# Patient Record
Sex: Female | Born: 1970
Health system: Southern US, Community
[De-identification: ages and names within clinical notes are randomized; demographics above are authoritative.]

## PROBLEM LIST (undated history)

## (undated) ENCOUNTER — Inpatient Hospital Stay: Admission: EM | Payer: Self-pay | Source: Home / Self Care

## (undated) DIAGNOSIS — Z923 Personal history of irradiation: Secondary | ICD-10-CM

## (undated) DIAGNOSIS — K7460 Unspecified cirrhosis of liver: Secondary | ICD-10-CM

## (undated) DIAGNOSIS — E079 Disorder of thyroid, unspecified: Secondary | ICD-10-CM

## (undated) DIAGNOSIS — J679 Hypersensitivity pneumonitis due to unspecified organic dust: Secondary | ICD-10-CM

## (undated) DIAGNOSIS — J302 Other seasonal allergic rhinitis: Secondary | ICD-10-CM

## (undated) DIAGNOSIS — E039 Hypothyroidism, unspecified: Secondary | ICD-10-CM

## (undated) DIAGNOSIS — B029 Zoster without complications: Secondary | ICD-10-CM

## (undated) DIAGNOSIS — D696 Thrombocytopenia, unspecified: Secondary | ICD-10-CM

## (undated) DIAGNOSIS — K219 Gastro-esophageal reflux disease without esophagitis: Secondary | ICD-10-CM

## (undated) DIAGNOSIS — N910 Primary amenorrhea: Secondary | ICD-10-CM

## (undated) DIAGNOSIS — D649 Anemia, unspecified: Secondary | ICD-10-CM

## (undated) DIAGNOSIS — Z9221 Personal history of antineoplastic chemotherapy: Secondary | ICD-10-CM

## (undated) DIAGNOSIS — E782 Mixed hyperlipidemia: Secondary | ICD-10-CM

## (undated) DIAGNOSIS — R002 Palpitations: Secondary | ICD-10-CM

## (undated) DIAGNOSIS — R569 Unspecified convulsions: Secondary | ICD-10-CM

## (undated) DIAGNOSIS — R011 Cardiac murmur, unspecified: Secondary | ICD-10-CM

## (undated) DIAGNOSIS — B1920 Unspecified viral hepatitis C without hepatic coma: Secondary | ICD-10-CM

## (undated) DIAGNOSIS — C801 Malignant (primary) neoplasm, unspecified: Secondary | ICD-10-CM

## (undated) DIAGNOSIS — C9590 Leukemia, unspecified not having achieved remission: Secondary | ICD-10-CM

## (undated) DIAGNOSIS — Z9289 Personal history of other medical treatment: Secondary | ICD-10-CM

## (undated) DIAGNOSIS — R0609 Other forms of dyspnea: Secondary | ICD-10-CM

## (undated) DIAGNOSIS — Z9484 Stem cells transplant status: Secondary | ICD-10-CM

## (undated) DIAGNOSIS — I10 Essential (primary) hypertension: Secondary | ICD-10-CM

## (undated) DIAGNOSIS — I2510 Atherosclerotic heart disease of native coronary artery without angina pectoris: Secondary | ICD-10-CM

## (undated) DIAGNOSIS — J849 Interstitial pulmonary disease, unspecified: Secondary | ICD-10-CM

## (undated) DIAGNOSIS — J8410 Pulmonary fibrosis, unspecified: Secondary | ICD-10-CM

## (undated) HISTORY — PX: BONE MARROW TRANSPLANT: SHX200

## (undated) HISTORY — PX: OTHER SURGICAL HISTORY: SHX169

## (undated) HISTORY — DX: Personal history of other medical treatment: Z92.89

## (undated) HISTORY — PX: BUNIONECTOMY: SHX129

## (undated) HISTORY — DX: Disorder of thyroid, unspecified: E07.9

## (undated) HISTORY — DX: Pulmonary fibrosis, unspecified: J84.10

## (undated) HISTORY — DX: Unspecified cirrhosis of liver: K74.60

## (undated) HISTORY — DX: Unspecified convulsions: R56.9

## (undated) HISTORY — DX: Thrombocytopenia, unspecified: D69.6

## (undated) HISTORY — DX: Anemia, unspecified: D64.9

## (undated) HISTORY — DX: Leukemia, unspecified not having achieved remission: C95.90

## (undated) HISTORY — DX: Zoster without complications: B02.9

## (undated) HISTORY — PX: TONGUE BIOPSY: SHX1075

## (undated) HISTORY — PX: CHOLECYSTECTOMY: SHX55

## (undated) HISTORY — DX: Malignant (primary) neoplasm, unspecified: C80.1

## (undated) HISTORY — DX: Unspecified viral hepatitis C without hepatic coma: B19.20

---
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
## 1992-05-06 HISTORY — PX: CHOLECYSTECTOMY, LAPAROSCOPIC: SHX56

## 2000-05-14 ENCOUNTER — Encounter: Admission: RE | Admit: 2000-05-14 | Discharge: 2000-05-14 | Payer: Self-pay | Admitting: *Deleted

## 2000-05-14 ENCOUNTER — Encounter: Payer: Self-pay | Admitting: *Deleted

## 2000-07-25 ENCOUNTER — Encounter (INDEPENDENT_AMBULATORY_CARE_PROVIDER_SITE_OTHER): Payer: Self-pay

## 2000-07-25 ENCOUNTER — Ambulatory Visit (HOSPITAL_COMMUNITY): Admission: RE | Admit: 2000-07-25 | Discharge: 2000-07-25 | Payer: Self-pay | Admitting: *Deleted

## 2000-09-15 ENCOUNTER — Other Ambulatory Visit: Admission: RE | Admit: 2000-09-15 | Discharge: 2000-09-15 | Payer: Self-pay | Admitting: *Deleted

## 2001-11-01 ENCOUNTER — Encounter: Payer: Self-pay | Admitting: Emergency Medicine

## 2001-11-01 ENCOUNTER — Emergency Department (HOSPITAL_COMMUNITY): Admission: EM | Admit: 2001-11-01 | Discharge: 2001-11-01 | Payer: Self-pay | Admitting: Emergency Medicine

## 2003-10-22 ENCOUNTER — Ambulatory Visit (HOSPITAL_COMMUNITY): Admission: RE | Admit: 2003-10-22 | Discharge: 2003-10-22 | Payer: Self-pay | Admitting: Emergency Medicine

## 2003-10-22 ENCOUNTER — Emergency Department (HOSPITAL_COMMUNITY): Admission: EM | Admit: 2003-10-22 | Discharge: 2003-10-23 | Payer: Self-pay | Admitting: Emergency Medicine

## 2003-11-01 ENCOUNTER — Ambulatory Visit (HOSPITAL_COMMUNITY): Admission: RE | Admit: 2003-11-01 | Discharge: 2003-11-01 | Payer: Self-pay

## 2004-07-18 ENCOUNTER — Emergency Department (HOSPITAL_COMMUNITY): Admission: EM | Admit: 2004-07-18 | Discharge: 2004-07-18 | Payer: Self-pay | Admitting: Emergency Medicine

## 2005-04-12 ENCOUNTER — Ambulatory Visit (HOSPITAL_COMMUNITY): Admission: RE | Admit: 2005-04-12 | Discharge: 2005-04-12 | Payer: Self-pay | Admitting: Obstetrics and Gynecology

## 2005-04-12 ENCOUNTER — Ambulatory Visit: Payer: Self-pay | Admitting: Gastroenterology

## 2005-06-24 ENCOUNTER — Ambulatory Visit (HOSPITAL_COMMUNITY): Admission: RE | Admit: 2005-06-24 | Discharge: 2005-06-24 | Payer: Self-pay | Admitting: Obstetrics and Gynecology

## 2005-07-26 ENCOUNTER — Ambulatory Visit: Payer: Self-pay | Admitting: Gastroenterology

## 2005-08-13 ENCOUNTER — Ambulatory Visit: Payer: Self-pay | Admitting: Gastroenterology

## 2005-09-13 ENCOUNTER — Ambulatory Visit: Payer: Self-pay | Admitting: Gastroenterology

## 2005-10-17 ENCOUNTER — Ambulatory Visit: Payer: Self-pay | Admitting: Gastroenterology

## 2005-10-24 ENCOUNTER — Ambulatory Visit: Payer: Self-pay | Admitting: Gastroenterology

## 2005-11-14 ENCOUNTER — Ambulatory Visit: Payer: Self-pay | Admitting: Gastroenterology

## 2005-11-28 ENCOUNTER — Ambulatory Visit: Payer: Self-pay | Admitting: Gastroenterology

## 2005-12-12 ENCOUNTER — Ambulatory Visit: Payer: Self-pay | Admitting: Gastroenterology

## 2006-01-09 ENCOUNTER — Ambulatory Visit: Payer: Self-pay | Admitting: Gastroenterology

## 2006-02-06 ENCOUNTER — Ambulatory Visit: Payer: Self-pay | Admitting: Gastroenterology

## 2006-03-06 ENCOUNTER — Ambulatory Visit: Payer: Self-pay | Admitting: Gastroenterology

## 2006-04-03 ENCOUNTER — Ambulatory Visit: Payer: Self-pay | Admitting: Gastroenterology

## 2006-05-07 ENCOUNTER — Ambulatory Visit: Payer: Self-pay | Admitting: Gastroenterology

## 2006-05-25 ENCOUNTER — Emergency Department (HOSPITAL_COMMUNITY): Admission: EM | Admit: 2006-05-25 | Discharge: 2006-05-25 | Payer: Self-pay | Admitting: Emergency Medicine

## 2006-06-05 ENCOUNTER — Ambulatory Visit: Payer: Self-pay | Admitting: Gastroenterology

## 2006-06-05 ENCOUNTER — Encounter (HOSPITAL_COMMUNITY): Admission: RE | Admit: 2006-06-05 | Discharge: 2006-07-05 | Payer: Self-pay | Admitting: Preventative Medicine

## 2006-07-24 ENCOUNTER — Ambulatory Visit: Payer: Self-pay | Admitting: Gastroenterology

## 2006-09-18 ENCOUNTER — Ambulatory Visit: Payer: Self-pay | Admitting: Gastroenterology

## 2006-12-18 ENCOUNTER — Ambulatory Visit: Payer: Self-pay | Admitting: Gastroenterology

## 2007-03-30 ENCOUNTER — Ambulatory Visit (HOSPITAL_COMMUNITY): Admission: RE | Admit: 2007-03-30 | Discharge: 2007-03-30 | Payer: Self-pay | Admitting: Obstetrics and Gynecology

## 2007-04-15 ENCOUNTER — Ambulatory Visit (HOSPITAL_COMMUNITY): Admission: RE | Admit: 2007-04-15 | Discharge: 2007-04-15 | Payer: Self-pay | Admitting: Obstetrics and Gynecology

## 2007-06-30 ENCOUNTER — Ambulatory Visit: Payer: Self-pay | Admitting: Gastroenterology

## 2007-08-13 ENCOUNTER — Ambulatory Visit (HOSPITAL_COMMUNITY): Admission: RE | Admit: 2007-08-13 | Discharge: 2007-08-13 | Payer: Self-pay | Admitting: Family Medicine

## 2007-09-03 ENCOUNTER — Ambulatory Visit (HOSPITAL_COMMUNITY): Admission: RE | Admit: 2007-09-03 | Discharge: 2007-09-03 | Payer: Self-pay | Admitting: Gastroenterology

## 2007-09-22 ENCOUNTER — Other Ambulatory Visit: Admission: RE | Admit: 2007-09-22 | Discharge: 2007-09-22 | Payer: Self-pay | Admitting: Obstetrics and Gynecology

## 2008-04-19 ENCOUNTER — Ambulatory Visit (HOSPITAL_COMMUNITY): Admission: RE | Admit: 2008-04-19 | Discharge: 2008-04-19 | Payer: Self-pay | Admitting: Obstetrics and Gynecology

## 2008-05-19 ENCOUNTER — Ambulatory Visit (HOSPITAL_COMMUNITY): Admission: RE | Admit: 2008-05-19 | Discharge: 2008-05-19 | Payer: Self-pay | Admitting: Obstetrics and Gynecology

## 2008-09-23 ENCOUNTER — Other Ambulatory Visit: Admission: RE | Admit: 2008-09-23 | Discharge: 2008-09-23 | Payer: Self-pay | Admitting: Obstetrics and Gynecology

## 2009-04-26 ENCOUNTER — Ambulatory Visit (HOSPITAL_COMMUNITY): Admission: RE | Admit: 2009-04-26 | Discharge: 2009-04-26 | Payer: Self-pay | Admitting: Obstetrics and Gynecology

## 2009-05-06 DIAGNOSIS — Z87442 Personal history of urinary calculi: Secondary | ICD-10-CM

## 2009-05-06 HISTORY — DX: Personal history of urinary calculi: Z87.442

## 2009-05-19 ENCOUNTER — Ambulatory Visit (HOSPITAL_COMMUNITY): Admission: RE | Admit: 2009-05-19 | Discharge: 2009-05-19 | Payer: Self-pay | Admitting: Family Medicine

## 2009-06-09 ENCOUNTER — Ambulatory Visit (HOSPITAL_COMMUNITY): Payer: Self-pay | Admitting: Oncology

## 2009-06-09 ENCOUNTER — Encounter (HOSPITAL_COMMUNITY): Admission: RE | Admit: 2009-06-09 | Discharge: 2009-07-09 | Payer: Self-pay | Admitting: Oncology

## 2009-07-14 ENCOUNTER — Encounter (HOSPITAL_COMMUNITY): Admission: RE | Admit: 2009-07-14 | Discharge: 2009-08-13 | Payer: Self-pay | Admitting: Oncology

## 2009-07-31 ENCOUNTER — Ambulatory Visit (HOSPITAL_COMMUNITY): Payer: Self-pay | Admitting: Oncology

## 2009-08-28 ENCOUNTER — Encounter (HOSPITAL_COMMUNITY): Admission: RE | Admit: 2009-08-28 | Discharge: 2009-09-27 | Payer: Self-pay | Admitting: Oncology

## 2009-09-25 ENCOUNTER — Ambulatory Visit (HOSPITAL_COMMUNITY): Payer: Self-pay | Admitting: Oncology

## 2009-09-27 ENCOUNTER — Other Ambulatory Visit: Admission: RE | Admit: 2009-09-27 | Discharge: 2009-09-27 | Payer: Self-pay | Admitting: Obstetrics and Gynecology

## 2009-10-23 ENCOUNTER — Encounter (HOSPITAL_COMMUNITY): Admission: RE | Admit: 2009-10-23 | Discharge: 2009-11-22 | Payer: Self-pay | Admitting: Oncology

## 2009-11-20 ENCOUNTER — Ambulatory Visit (HOSPITAL_COMMUNITY): Payer: Self-pay | Admitting: Oncology

## 2009-12-18 ENCOUNTER — Inpatient Hospital Stay (HOSPITAL_COMMUNITY): Admission: EM | Admit: 2009-12-18 | Discharge: 2009-12-20 | Payer: Self-pay | Admitting: Emergency Medicine

## 2009-12-25 ENCOUNTER — Encounter (HOSPITAL_COMMUNITY): Admission: RE | Admit: 2009-12-25 | Discharge: 2010-01-24 | Payer: Self-pay | Admitting: Oncology

## 2010-01-09 ENCOUNTER — Ambulatory Visit (HOSPITAL_COMMUNITY): Payer: Self-pay | Admitting: Oncology

## 2010-02-19 ENCOUNTER — Encounter (HOSPITAL_COMMUNITY)
Admission: RE | Admit: 2010-02-19 | Discharge: 2010-03-21 | Payer: Self-pay | Source: Home / Self Care | Admitting: Oncology

## 2010-05-01 ENCOUNTER — Ambulatory Visit (HOSPITAL_COMMUNITY)
Admission: RE | Admit: 2010-05-01 | Discharge: 2010-05-01 | Payer: Self-pay | Source: Home / Self Care | Attending: Obstetrics and Gynecology | Admitting: Obstetrics and Gynecology

## 2010-05-06 DIAGNOSIS — Z8619 Personal history of other infectious and parasitic diseases: Secondary | ICD-10-CM

## 2010-05-06 HISTORY — DX: Personal history of other infectious and parasitic diseases: Z86.19

## 2010-05-27 ENCOUNTER — Encounter: Payer: Self-pay | Admitting: Obstetrics and Gynecology

## 2010-06-11 ENCOUNTER — Other Ambulatory Visit (HOSPITAL_COMMUNITY): Payer: Self-pay | Admitting: Oncology

## 2010-06-11 ENCOUNTER — Encounter (HOSPITAL_COMMUNITY): Payer: 59 | Attending: Oncology

## 2010-06-11 ENCOUNTER — Other Ambulatory Visit (HOSPITAL_COMMUNITY): Payer: 59

## 2010-06-11 ENCOUNTER — Encounter (HOSPITAL_COMMUNITY): Admission: RE | Admit: 2010-06-11 | Payer: Self-pay | Source: Home / Self Care | Admitting: Oncology

## 2010-06-11 DIAGNOSIS — C9101 Acute lymphoblastic leukemia, in remission: Secondary | ICD-10-CM | POA: Insufficient documentation

## 2010-06-11 DIAGNOSIS — D696 Thrombocytopenia, unspecified: Secondary | ICD-10-CM

## 2010-06-11 DIAGNOSIS — I10 Essential (primary) hypertension: Secondary | ICD-10-CM | POA: Insufficient documentation

## 2010-06-11 DIAGNOSIS — E039 Hypothyroidism, unspecified: Secondary | ICD-10-CM | POA: Insufficient documentation

## 2010-06-11 DIAGNOSIS — Z79899 Other long term (current) drug therapy: Secondary | ICD-10-CM | POA: Insufficient documentation

## 2010-06-11 DIAGNOSIS — B1920 Unspecified viral hepatitis C without hepatic coma: Secondary | ICD-10-CM | POA: Insufficient documentation

## 2010-06-11 LAB — CBC
HCT: 41.3 % (ref 36.0–46.0)
Hemoglobin: 14.2 g/dL (ref 12.0–15.0)
MCV: 98.3 fL (ref 78.0–100.0)
RDW: 12.9 % (ref 11.5–15.5)
WBC: 6.8 10*3/uL (ref 4.0–10.5)

## 2010-06-11 LAB — DIFFERENTIAL
Eosinophils Absolute: 0 10*3/uL (ref 0.0–0.7)
Eosinophils Relative: 0 % (ref 0–5)
Lymphocytes Relative: 36 % (ref 12–46)
Lymphs Abs: 2.4 10*3/uL (ref 0.7–4.0)
Neutro Abs: 3.7 10*3/uL (ref 1.7–7.7)
Neutrophils Relative %: 55 % (ref 43–77)

## 2010-06-18 ENCOUNTER — Ambulatory Visit (HOSPITAL_COMMUNITY): Payer: 59 | Admitting: Oncology

## 2010-06-18 DIAGNOSIS — D696 Thrombocytopenia, unspecified: Secondary | ICD-10-CM

## 2010-07-18 LAB — DIFFERENTIAL
Basophils Absolute: 0 10*3/uL (ref 0.0–0.1)
Basophils Relative: 0 % (ref 0–1)
Eosinophils Relative: 0 % (ref 0–5)
Lymphocytes Relative: 45 % (ref 12–46)
Monocytes Relative: 10 % (ref 3–12)

## 2010-07-18 LAB — CBC
HCT: 41.5 % (ref 36.0–46.0)
Hemoglobin: 14.1 g/dL (ref 12.0–15.0)
MCV: 104.2 fL — ABNORMAL HIGH (ref 78.0–100.0)
Platelets: 80 10*3/uL — ABNORMAL LOW (ref 150–400)
RBC: 3.99 MIL/uL (ref 3.87–5.11)
WBC: 5.8 10*3/uL (ref 4.0–10.5)

## 2010-07-19 LAB — DIFFERENTIAL
Basophils Absolute: 0 10*3/uL (ref 0.0–0.1)
Basophils Absolute: 0.1 10*3/uL (ref 0.0–0.1)
Basophils Relative: 0 % (ref 0–1)
Basophils Relative: 0 % (ref 0–1)
Basophils Relative: 1 % (ref 0–1)
Eosinophils Absolute: 0 10*3/uL (ref 0.0–0.7)
Eosinophils Absolute: 0.1 10*3/uL (ref 0.0–0.7)
Eosinophils Absolute: 0.1 10*3/uL (ref 0.0–0.7)
Eosinophils Relative: 0 % (ref 0–5)
Eosinophils Relative: 0 % (ref 0–5)
Eosinophils Relative: 1 % (ref 0–5)
Eosinophils Relative: 1 % (ref 0–5)
Lymphocytes Relative: 25 % (ref 12–46)
Lymphocytes Relative: 31 % (ref 12–46)
Lymphocytes Relative: 34 % (ref 12–46)
Lymphs Abs: 2.1 10*3/uL (ref 0.7–4.0)
Lymphs Abs: 2.4 10*3/uL (ref 0.7–4.0)
Lymphs Abs: 2.7 10*3/uL (ref 0.7–4.0)
Lymphs Abs: 4.7 10*3/uL — ABNORMAL HIGH (ref 0.7–4.0)
Monocytes Absolute: 0.4 10*3/uL (ref 0.1–1.0)
Monocytes Absolute: 0.6 10*3/uL (ref 0.1–1.0)
Monocytes Absolute: 0.7 10*3/uL (ref 0.1–1.0)
Monocytes Absolute: 0.9 10*3/uL (ref 0.1–1.0)
Monocytes Relative: 4 % (ref 3–12)
Monocytes Relative: 6 % (ref 3–12)
Monocytes Relative: 6 % (ref 3–12)
Monocytes Relative: 6 % (ref 3–12)
Neutro Abs: 4 10*3/uL (ref 1.7–7.7)
Neutro Abs: 7.4 10*3/uL (ref 1.7–7.7)
Neutro Abs: 9.5 10*3/uL — ABNORMAL HIGH (ref 1.7–7.7)
Neutrophils Relative %: 58 % (ref 43–77)
Neutrophils Relative %: 68 % (ref 43–77)

## 2010-07-19 LAB — CBC
HCT: 28.6 % — ABNORMAL LOW (ref 36.0–46.0)
HCT: 30.9 % — ABNORMAL LOW (ref 36.0–46.0)
HCT: 31.9 % — ABNORMAL LOW (ref 36.0–46.0)
HCT: 34.8 % — ABNORMAL LOW (ref 36.0–46.0)
Hemoglobin: 10.7 g/dL — ABNORMAL LOW (ref 12.0–15.0)
Hemoglobin: 12.1 g/dL (ref 12.0–15.0)
Hemoglobin: 9.9 g/dL — ABNORMAL LOW (ref 12.0–15.0)
MCH: 36.5 pg — ABNORMAL HIGH (ref 26.0–34.0)
MCH: 37 pg — ABNORMAL HIGH (ref 26.0–34.0)
MCH: 37.3 pg — ABNORMAL HIGH (ref 26.0–34.0)
MCHC: 34.5 g/dL (ref 30.0–36.0)
MCHC: 34.7 g/dL (ref 30.0–36.0)
MCHC: 34.7 g/dL (ref 30.0–36.0)
MCHC: 35 g/dL (ref 30.0–36.0)
MCV: 105.2 fL — ABNORMAL HIGH (ref 78.0–100.0)
MCV: 107.3 fL — ABNORMAL HIGH (ref 78.0–100.0)
MCV: 107.5 fL — ABNORMAL HIGH (ref 78.0–100.0)
Platelets: 138 10*3/uL — ABNORMAL LOW (ref 150–400)
Platelets: 59 10*3/uL — ABNORMAL LOW (ref 150–400)
Platelets: 63 10*3/uL — ABNORMAL LOW (ref 150–400)
Platelets: 72 10*3/uL — ABNORMAL LOW (ref 150–400)
RBC: 2.67 MIL/uL — ABNORMAL LOW (ref 3.87–5.11)
RBC: 2.88 MIL/uL — ABNORMAL LOW (ref 3.87–5.11)
RBC: 3.31 MIL/uL — ABNORMAL LOW (ref 3.87–5.11)
RDW: 11.7 % (ref 11.5–15.5)
RDW: 11.8 % (ref 11.5–15.5)
RDW: 12.3 % (ref 11.5–15.5)
WBC: 10.9 10*3/uL — ABNORMAL HIGH (ref 4.0–10.5)
WBC: 15.1 10*3/uL — ABNORMAL HIGH (ref 4.0–10.5)
WBC: 7 10*3/uL (ref 4.0–10.5)

## 2010-07-19 LAB — PHOSPHORUS: Phosphorus: 2.9 mg/dL (ref 2.3–4.6)

## 2010-07-19 LAB — BASIC METABOLIC PANEL
BUN: 13 mg/dL (ref 6–23)
CO2: 15 mEq/L — ABNORMAL LOW (ref 19–32)
CO2: 21 mEq/L (ref 19–32)
Calcium: 9.4 mg/dL (ref 8.4–10.5)
Chloride: 106 mEq/L (ref 96–112)
Chloride: 111 mEq/L (ref 96–112)
Chloride: 115 mEq/L — ABNORMAL HIGH (ref 96–112)
GFR calc Af Amer: 40 mL/min — ABNORMAL LOW (ref >60)
GFR calc Af Amer: 46 mL/min — ABNORMAL LOW (ref >60)
GFR calc Af Amer: >60 mL/min (ref >60)
GFR calc non Af Amer: 33 mL/min — ABNORMAL LOW (ref >60)
Glucose, Bld: 115 mg/dL — ABNORMAL HIGH (ref 70–99)
Potassium: 2.7 mEq/L — CL (ref 3.5–5.1)
Potassium: 3 mEq/L — ABNORMAL LOW (ref 3.5–5.1)
Potassium: 4.1 mEq/L (ref 3.5–5.1)
Sodium: 132 mEq/L — ABNORMAL LOW (ref 135–145)
Sodium: 140 mEq/L (ref 135–145)
Sodium: 143 mEq/L (ref 135–145)
Sodium: 144 mEq/L (ref 135–145)

## 2010-07-19 LAB — CULTURE, BLOOD (ROUTINE X 2)
Culture: NO GROWTH
Report Status: 8202011

## 2010-07-19 LAB — MRSA PCR SCREENING: MRSA by PCR: NEGATIVE

## 2010-07-19 LAB — TSH: TSH: 0.086 u[IU]/mL — ABNORMAL LOW (ref 0.350–4.500)

## 2010-07-19 LAB — MAGNESIUM: Magnesium: 1.4 mg/dL — ABNORMAL LOW (ref 1.5–2.5)

## 2010-07-19 LAB — HEPATIC FUNCTION PANEL
AST: 55 U/L — ABNORMAL HIGH (ref 0–37)
Albumin: 3.1 g/dL — ABNORMAL LOW (ref 3.5–5.2)
Alkaline Phosphatase: 59 U/L (ref 39–117)
Bilirubin, Direct: 0.2 mg/dL (ref 0.0–0.3)
Indirect Bilirubin: 0.3 mg/dL (ref 0.3–0.9)
Total Bilirubin: 0.5 mg/dL (ref 0.3–1.2)
Total Protein: 6.4 g/dL (ref 6.0–8.3)

## 2010-07-19 LAB — PTH, INTACT AND CALCIUM
Calcium, Total (PTH): 8.2 mg/dL — ABNORMAL LOW (ref 8.4–10.5)
PTH: 23.7 pg/mL (ref 14.0–72.0)

## 2010-07-19 LAB — BRAIN NATRIURETIC PEPTIDE: Pro B Natriuretic peptide (BNP): 347 pg/mL — ABNORMAL HIGH (ref 0.0–100.0)

## 2010-07-20 LAB — URINALYSIS, ROUTINE W REFLEX MICROSCOPIC
Glucose, UA: NEGATIVE mg/dL
Hgb urine dipstick: NEGATIVE
Ketones, ur: NEGATIVE mg/dL
Protein, ur: NEGATIVE mg/dL
Urobilinogen, UA: 0.2 mg/dL (ref 0.0–1.0)

## 2010-07-20 LAB — URINE CULTURE: Culture  Setup Time: 201108151708

## 2010-07-20 LAB — CBC
MCH: 36.6 pg — ABNORMAL HIGH (ref 26.0–34.0)
MCHC: 34.1 g/dL (ref 30.0–36.0)
MCV: 107.3 fL — ABNORMAL HIGH (ref 78.0–100.0)
Platelets: 85 10*3/uL — ABNORMAL LOW (ref 150–400)
RBC: 3.39 MIL/uL — ABNORMAL LOW (ref 3.87–5.11)
RDW: 12.1 % (ref 11.5–15.5)

## 2010-07-20 LAB — RAPID URINE DRUG SCREEN, HOSP PERFORMED: Tetrahydrocannabinol: NOT DETECTED

## 2010-07-20 LAB — DIFFERENTIAL
Basophils Relative: 0 % (ref 0–1)
Eosinophils Absolute: 0 10*3/uL (ref 0.0–0.7)
Eosinophils Relative: 0 % (ref 0–5)
Lymphs Abs: 2 10*3/uL (ref 0.7–4.0)
Neutrophils Relative %: 79 % — ABNORMAL HIGH (ref 43–77)

## 2010-07-20 LAB — BASIC METABOLIC PANEL
BUN: 18 mg/dL (ref 6–23)
CO2: 21 mEq/L (ref 19–32)
Calcium: 11.2 mg/dL — ABNORMAL HIGH (ref 8.4–10.5)
Chloride: 109 mEq/L (ref 96–112)
Creatinine, Ser: 1.38 mg/dL — ABNORMAL HIGH (ref 0.4–1.2)
GFR calc Af Amer: 52 mL/min — ABNORMAL LOW (ref >60)
Glucose, Bld: 140 mg/dL — ABNORMAL HIGH (ref 70–99)

## 2010-07-20 LAB — WET PREP, GENITAL
Clue Cells Wet Prep HPF POC: NONE SEEN
Yeast Wet Prep HPF POC: NONE SEEN

## 2010-07-21 LAB — CBC
MCHC: 34.8 g/dL (ref 30.0–36.0)
Platelets: 90 10*3/uL — ABNORMAL LOW (ref 150–400)
RDW: 12.5 % (ref 11.5–15.5)
WBC: 6.2 10*3/uL (ref 4.0–10.5)

## 2010-07-21 LAB — DIFFERENTIAL
Basophils Absolute: 0 10*3/uL (ref 0.0–0.1)
Basophils Relative: 0 % (ref 0–1)
Neutro Abs: 2.1 10*3/uL (ref 1.7–7.7)
Neutrophils Relative %: 34 % — ABNORMAL LOW (ref 43–77)

## 2010-07-22 LAB — CBC
Hemoglobin: 14 g/dL (ref 12.0–15.0)
RBC: 3.84 MIL/uL — ABNORMAL LOW (ref 3.87–5.11)
RDW: 12.6 % (ref 11.5–15.5)
WBC: 5.9 10*3/uL (ref 4.0–10.5)

## 2010-07-22 LAB — DIFFERENTIAL
Basophils Absolute: 0 10*3/uL (ref 0.0–0.1)
Lymphocytes Relative: 57 % — ABNORMAL HIGH (ref 12–46)
Lymphs Abs: 3.4 10*3/uL (ref 0.7–4.0)
Monocytes Absolute: 0.3 10*3/uL (ref 0.1–1.0)
Monocytes Relative: 6 % (ref 3–12)
Neutro Abs: 2.2 10*3/uL (ref 1.7–7.7)

## 2010-07-23 LAB — DIFFERENTIAL
Basophils Relative: 0 % (ref 0–1)
Eosinophils Absolute: 0 10*3/uL (ref 0.0–0.7)
Lymphs Abs: 3.4 10*3/uL (ref 0.7–4.0)
Monocytes Relative: 8 % (ref 3–12)
Neutro Abs: 2.3 10*3/uL (ref 1.7–7.7)
Neutrophils Relative %: 37 % — ABNORMAL LOW (ref 43–77)

## 2010-07-23 LAB — CBC
MCHC: 35.7 g/dL (ref 30.0–36.0)
MCV: 104.7 fL — ABNORMAL HIGH (ref 78.0–100.0)
Platelets: 79 10*3/uL — ABNORMAL LOW (ref 150–400)

## 2010-07-24 LAB — CBC
MCHC: 35.7 g/dL (ref 30.0–36.0)
MCV: 104.8 fL — ABNORMAL HIGH (ref 78.0–100.0)
Platelets: 109 10*3/uL — ABNORMAL LOW (ref 150–400)

## 2010-07-24 LAB — DIFFERENTIAL
Basophils Absolute: 0 10*3/uL (ref 0.0–0.1)
Basophils Relative: 1 % (ref 0–1)
Eosinophils Absolute: 0.1 10*3/uL (ref 0.0–0.7)
Neutro Abs: 2.2 10*3/uL (ref 1.7–7.7)
Neutrophils Relative %: 39 % — ABNORMAL LOW (ref 43–77)

## 2010-07-25 LAB — DIFFERENTIAL
Eosinophils Absolute: 0 10*3/uL (ref 0.0–0.7)
Lymphocytes Relative: 53 % — ABNORMAL HIGH (ref 12–46)
Lymphs Abs: 3.3 10*3/uL (ref 0.7–4.0)
Neutro Abs: 2.3 10*3/uL (ref 1.7–7.7)
Neutrophils Relative %: 37 % — ABNORMAL LOW (ref 43–77)

## 2010-07-25 LAB — CBC
Platelets: 90 10*3/uL — ABNORMAL LOW (ref 150–400)
RBC: 3.78 MIL/uL — ABNORMAL LOW (ref 3.87–5.11)
WBC: 6.2 10*3/uL (ref 4.0–10.5)

## 2010-07-25 LAB — COMPREHENSIVE METABOLIC PANEL
ALT: 63 U/L — ABNORMAL HIGH (ref 0–35)
Alkaline Phosphatase: 91 U/L (ref 39–117)
Chloride: 109 mEq/L (ref 96–112)
Glucose, Bld: 104 mg/dL — ABNORMAL HIGH (ref 70–99)
Potassium: 3.7 mEq/L (ref 3.5–5.1)
Sodium: 140 mEq/L (ref 135–145)
Total Protein: 7.7 g/dL (ref 6.0–8.3)

## 2010-07-25 LAB — FERRITIN: Ferritin: 791 ng/mL — ABNORMAL HIGH (ref 10–291)

## 2010-07-25 LAB — VITAMIN B12: Vitamin B-12: 1329 pg/mL — ABNORMAL HIGH (ref 211–911)

## 2010-07-25 LAB — IRON AND TIBC
Iron: 184 ug/dL — ABNORMAL HIGH (ref 42–135)
Saturation Ratios: 51 % (ref 20–55)
TIBC: 360 ug/dL (ref 250–470)
UIBC: 176 ug/dL

## 2010-07-25 LAB — HCV RNA QUANT: HCV Quantitative Log: 6.29 {Log} — ABNORMAL HIGH (ref <1.63)

## 2010-07-25 LAB — RETICULOCYTES: RBC.: 3.78 MIL/uL — ABNORMAL LOW (ref 3.87–5.11)

## 2010-07-26 ENCOUNTER — Telehealth: Payer: Self-pay | Admitting: *Deleted

## 2010-07-26 NOTE — Telephone Encounter (Signed)
rec'd call from our out pt pharmacy. All of her drugs are to be sent to our pharmacy. They will then be sent to [facility] for her. DO NOT use any other pharmacy.Latoya Bautista

## 2010-07-29 LAB — CBC
HCT: 38.5 % (ref 36.0–46.0)
Hemoglobin: 13.5 g/dL (ref 12.0–15.0)
MCHC: 35 g/dL (ref 30.0–36.0)
RDW: 12.1 % (ref 11.5–15.5)

## 2010-07-29 LAB — DIFFERENTIAL
Basophils Absolute: 0 10*3/uL (ref 0.0–0.1)
Basophils Relative: 0 % (ref 0–1)
Eosinophils Relative: 1 % (ref 0–5)
Monocytes Absolute: 0.3 10*3/uL (ref 0.1–1.0)

## 2010-08-15 ENCOUNTER — Encounter (HOSPITAL_COMMUNITY): Payer: 59 | Attending: Oncology | Admitting: Oncology

## 2010-08-15 DIAGNOSIS — I10 Essential (primary) hypertension: Secondary | ICD-10-CM | POA: Insufficient documentation

## 2010-08-15 DIAGNOSIS — B1920 Unspecified viral hepatitis C without hepatic coma: Secondary | ICD-10-CM | POA: Insufficient documentation

## 2010-08-15 DIAGNOSIS — D696 Thrombocytopenia, unspecified: Secondary | ICD-10-CM | POA: Insufficient documentation

## 2010-08-15 DIAGNOSIS — E039 Hypothyroidism, unspecified: Secondary | ICD-10-CM | POA: Insufficient documentation

## 2010-08-15 DIAGNOSIS — C9101 Acute lymphoblastic leukemia, in remission: Secondary | ICD-10-CM | POA: Insufficient documentation

## 2010-08-15 DIAGNOSIS — Z79899 Other long term (current) drug therapy: Secondary | ICD-10-CM | POA: Insufficient documentation

## 2010-11-22 ENCOUNTER — Encounter (HOSPITAL_COMMUNITY): Payer: 59 | Attending: Oncology

## 2010-11-22 DIAGNOSIS — D638 Anemia in other chronic diseases classified elsewhere: Secondary | ICD-10-CM

## 2010-11-22 MED ORDER — EPOETIN ALFA 40000 UNIT/ML IJ SOLN
INTRAMUSCULAR | Status: AC
  Administered 2010-11-22: 40000 [IU] via SUBCUTANEOUS
  Filled 2010-11-22: qty 1

## 2010-11-22 MED ORDER — EPOETIN ALFA 40000 UNIT/ML IJ SOLN
40000.0000 [IU] | Freq: Once | INTRAMUSCULAR | Status: AC
  Administered 2010-11-22: 40000 [IU] via SUBCUTANEOUS

## 2010-11-30 ENCOUNTER — Ambulatory Visit (HOSPITAL_COMMUNITY): Payer: 59

## 2010-12-03 ENCOUNTER — Encounter (HOSPITAL_BASED_OUTPATIENT_CLINIC_OR_DEPARTMENT_OTHER): Payer: 59

## 2010-12-03 ENCOUNTER — Encounter (HOSPITAL_COMMUNITY): Payer: Self-pay

## 2010-12-03 VITALS — BP 145/87 | HR 120 | Temp 98.8°F | Wt 118.2 lb

## 2010-12-03 DIAGNOSIS — K7460 Unspecified cirrhosis of liver: Secondary | ICD-10-CM

## 2010-12-03 DIAGNOSIS — B1920 Unspecified viral hepatitis C without hepatic coma: Secondary | ICD-10-CM

## 2010-12-03 DIAGNOSIS — D696 Thrombocytopenia, unspecified: Secondary | ICD-10-CM

## 2010-12-03 NOTE — Progress Notes (Signed)
CC:   Diamantina Monks, M.D.  IDENTIFYING STATEMENT:  The patient is a 40 year old woman with hepatitis C and liver cirrhosis with thrombocytopenia who presents for followup.  INTERVAL HISTORY:  The patient is currently undergoing active therapy for hep C.  She was until May 31 receiving in [place]. She currently continues with ribavirin and peginterferon alfa 2A.  She documents that this therapy is working well.  She currently denies fever, chills or night sweats.  She denies a cough.  She has good energy levels and continues to work.  She has not noted any bruising or bleeding.  MEDICATIONS:  Documented and as in nursing intake sheet.  ALLERGIES:  Asparaginase.  REVIEW OF SYSTEMS:  10-point review of systems essentially negative.  PHYSICAL EXAMINATION:  The patient is alert and oriented x3.  Vitals: Pulse 80, blood pressure 131/81, temperature 98.8, respirations 16, weight 118 pounds.  HEENT:  Head is atraumatic, normocephalic.  Sclerae anicteric.  Mouth moist.  No thrush.  Neck is supple.  Chest is clear. CVS unremarkable.  Abdomen:  Soft, nontender.  Bowel sounds present. Extremities:  No edema.  LABORATORY DATA:  12/03/2010 white cell count 6.8, hemoglobin 14.2, hematocrit 41.3, platelets 90 (in April 2012 here at the clinic, platelets were 49,000).  IMPRESSION AND PLAN:  Patient is a 39-year woman with hepatitis C with stage 3 liver damage.  She is currently undergoing treatment.  Platelet counts remain stable and she has no bleeding or bruising.  She currently is being very been closely followed at [facility] while she is on therapy. Therefore with this said will have her follow up in 4 months' time with a CBC.  If in the interim if she has any issues or concerns, she is not to hesitate to contact us.    ______________________________ Laurice Record, M.D. LIO/MEDQ  D:  12/03/2010  T:  12/03/2010  Job:  [id number]

## 2010-12-04 ENCOUNTER — Other Ambulatory Visit (HOSPITAL_COMMUNITY): Payer: Self-pay | Admitting: Oncology

## 2010-12-07 ENCOUNTER — Ambulatory Visit (HOSPITAL_COMMUNITY): Payer: 59

## 2010-12-27 ENCOUNTER — Telehealth (HOSPITAL_COMMUNITY): Payer: Self-pay

## 2010-12-27 NOTE — Telephone Encounter (Signed)
Per patient, she  was seen @ [facility] yesterday.  Is having weekly labs done by [facility].  Hemoglobin has been high enough not to require any procrit at this time.

## 2010-12-28 NOTE — Telephone Encounter (Signed)
OK 

## 2011-04-09 ENCOUNTER — Other Ambulatory Visit (HOSPITAL_COMMUNITY): Payer: 59

## 2011-04-09 ENCOUNTER — Ambulatory Visit (HOSPITAL_COMMUNITY): Payer: 59 | Admitting: Oncology

## 2011-04-11 ENCOUNTER — Encounter (HOSPITAL_COMMUNITY): Payer: 59 | Attending: Oncology | Admitting: Oncology

## 2011-04-11 ENCOUNTER — Encounter (HOSPITAL_COMMUNITY): Payer: Self-pay | Admitting: Oncology

## 2011-04-11 DIAGNOSIS — B1920 Unspecified viral hepatitis C without hepatic coma: Secondary | ICD-10-CM

## 2011-04-11 DIAGNOSIS — Z8619 Personal history of other infectious and parasitic diseases: Secondary | ICD-10-CM | POA: Insufficient documentation

## 2011-04-11 DIAGNOSIS — D696 Thrombocytopenia, unspecified: Secondary | ICD-10-CM

## 2011-04-11 DIAGNOSIS — K7460 Unspecified cirrhosis of liver: Secondary | ICD-10-CM

## 2011-04-11 DIAGNOSIS — D6959 Other secondary thrombocytopenia: Secondary | ICD-10-CM

## 2011-04-11 HISTORY — DX: Thrombocytopenia, unspecified: D69.6

## 2011-04-11 HISTORY — DX: Unspecified cirrhosis of liver: K74.60

## 2011-04-11 HISTORY — DX: Unspecified viral hepatitis C without hepatic coma: B19.20

## 2011-04-11 NOTE — Patient Instructions (Signed)
[facility]  Discharge Instructions  RECOMMENDATIONS MADE BY THE CONSULTANT AND ANY TEST RESULTS WILL BE SENT TO YOUR REFERRING DOCTOR.   EXAM FINDINGS BY MD TODAY AND SIGNS AND SYMPTOMS TO REPORT TO CLINIC OR PRIMARY MD: Continue to follow up at [facility] as you have been doing. Return to this clinic in 4 months to see Dr. Mariel Sleet.     I acknowledge that I have been informed and understand all the instructions given to me and received a copy. I do not have any more questions at this time, but understand that I may call the Specialty Clinic at [facility] at [phone] during business hours should I have any further questions or need assistance in obtaining follow-up care.    __________________________________________  _____________  __________ Signature of Patient or Authorized Representative            Date                   Time    __________________________________________ Nurse's Signature

## 2011-04-11 NOTE — Progress Notes (Signed)
Cassell Smiles., MD [address] [place] [address]  1. Hepatitis C   2. Liver cirrhosis   3. Thrombocytopenia     INTERVAL HISTORY: Latoya Bautista 40 y.o. female returns for  regular  visit for followup of Thrombocytopenia secondary to hepatitis C and liver cirrhosis.  The patient denies any complaints.  She denies any bleeding or easy bruising.   On further questioning, she does admit to occasional nosebleeds which resolve.  She denies any blood in stool, black tarry stool, hematuria.    She reports that she has only a few more weeks worth of her medication for Hepatitis C.  She explains that she is to take this medication, pegasys, for 48 weeks.  She explains within the first few weeks of taking this medication, her load burden was undetectable.  This appears to be a promising sign in regards to her infection.  Hematologically, the patient denies any complaints.  She get her blood work performed at [facility].  We will try to get these results to be scanned in to [facility].  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  Past Medical History  Diagnosis Date  . Leukemia   . Cancer     tongue cancer  . Anemia   . Osteoporosis   . Seizures   . Thyroid disease   . Hepatitis C   . Shingles   . Hepatitis C 04/11/2011  . Liver cirrhosis 04/11/2011  . Thrombocytopenia 04/11/2011    has Hepatitis C; Liver cirrhosis; and Thrombocytopenia on her problem list.     is allergic to asparaginase derivatives and elspar.  Latoya Bautista does not currently have medications on file.  Past Surgical History  Procedure Date  . Cholecystectomy   . Liver biopsie   . Tongue cancer     surgical removal of area    Denies any headaches, dizziness, double vision, fevers, chills, night sweats, nausea, vomiting, diarrhea, constipation, chest pain, heart palpitations, shortness of breath, blood in stool, black tarry stool, urinary pain, urinary burning, urinary frequency,  hematuria.   PHYSICAL EXAMINATION  ECOG PERFORMANCE STATUS: 1 - Symptomatic but completely ambulatory  Filed Vitals:   04/11/11 1114  BP: 121/75  Pulse: 90  Temp: 97.5 F (36.4 C)    GENERAL:alert, no distress, well nourished, well developed, comfortable, cooperative and smiling SKIN: skin color, texture, turgor are normal HEAD: Normocephalic EYES: normal EARS: External ears normal OROPHARYNX:mucous membranes are moist  NECK: supple, trachea midline LYMPH:  no palpable lymphadenopathy BREAST:not examined LUNGS: clear to auscultation and percussion HEART: regular rate & rhythm, no murmurs, no gallops, S1 normal and S2 normal ABDOMEN:abdomen soft, non-tender and normal bowel sounds BACK: Back symmetric, no curvature. EXTREMITIES:less then 2 second capillary refill, no joint deformities, effusion, or inflammation, no edema, no skin discoloration, no clubbing, no cyanosis  NEURO: alert & oriented x 3 with fluent speech, no focal motor/sensory deficits, gait normal    ASSESSMENT:  1. Thrombocytopenia due to liver disease 2. Hepatitis C, on treatment and followed by Hepatologist at [facility] 3. Stage 3 liver damage. 4. Occasional epistaxis    PLAN:  1. We will see if we can attain lab work from [facility] 2. The patient will continue follow-up with [facility] Hepatologist. 3. Return to the clinic for follow-up in 4 months.  We are glad to be this patient's local support.   All questions were answered. The patient knows to call the clinic with any problems, questions or concerns. We  can certainly see the patient much sooner if necessary.   I spent 20 minutes counseling the patient face to face. The total time spent in the appointment was 25 minutes.  Latoya Bautista

## 2011-04-17 ENCOUNTER — Other Ambulatory Visit (HOSPITAL_COMMUNITY): Payer: Self-pay | Admitting: Oncology

## 2011-05-07 HISTORY — PX: BONE MARROW BIOPSY: SHX1253

## 2011-05-22 ENCOUNTER — Other Ambulatory Visit: Payer: Self-pay | Admitting: Obstetrics and Gynecology

## 2011-05-22 DIAGNOSIS — Z139 Encounter for screening, unspecified: Secondary | ICD-10-CM

## 2011-07-13 ENCOUNTER — Other Ambulatory Visit: Payer: Self-pay | Admitting: Obstetrics and Gynecology

## 2011-08-06 ENCOUNTER — Encounter (HOSPITAL_COMMUNITY): Payer: Self-pay | Admitting: Oncology

## 2011-08-06 ENCOUNTER — Other Ambulatory Visit: Payer: Self-pay | Admitting: Obstetrics and Gynecology

## 2011-08-06 ENCOUNTER — Encounter (HOSPITAL_COMMUNITY): Payer: 59 | Attending: Oncology | Admitting: Oncology

## 2011-08-06 VITALS — BP 103/67 | HR 91 | Temp 98.6°F | Ht 60.0 in | Wt 107.3 lb

## 2011-08-06 DIAGNOSIS — B1920 Unspecified viral hepatitis C without hepatic coma: Secondary | ICD-10-CM

## 2011-08-06 DIAGNOSIS — D696 Thrombocytopenia, unspecified: Secondary | ICD-10-CM

## 2011-08-06 DIAGNOSIS — Z139 Encounter for screening, unspecified: Secondary | ICD-10-CM

## 2011-08-06 NOTE — Patient Instructions (Signed)
Latoya Bautista  [id number] 1970-06-12   [facility] Specialty Clinic  Discharge Instructions  RECOMMENDATIONS MADE BY THE CONSULTANT AND ANY TEST RESULTS WILL BE SENT TO YOUR REFERRING DOCTOR.   EXAM FINDINGS BY MD TODAY AND SIGNS AND SYMPTOMS TO REPORT TO CLINIC OR PRIMARY MD: We do not need to do any labs today.  Ask your MD at [facility] to send [place] your notes and lab results (Fax number is [phone])  MEDICATIONS PRESCRIBED: none   INSTRUCTIONS GIVEN AND DISCUSSED: Other :  Report unusual bruising or bleeding.  SPECIAL INSTRUCTIONS/FOLLOW-UP: Return to Clinic in 7 - 8 months for follow-up.   I acknowledge that I have been informed and understand all the instructions given to me and received a copy. I do not have any more questions at this time, but understand that I may call the Specialty Clinic at [facility] at [phone] during business hours should I have any further questions or need assistance in obtaining follow-up care.    __________________________________________  _____________  __________ Signature of Patient or Authorized Representative            Date                   Time    __________________________________________ Nurse's Signature

## 2011-08-06 NOTE — Progress Notes (Signed)
This office note has been dictated.

## 2011-08-06 NOTE — Progress Notes (Signed)
CC:   Collier Bullock, MD Madelin Rear. Fusco, MD  DIAGNOSIS:  Hepatitis C with thrombocytopenia here for routine followup.  [place] states that she is no longer on her antiviral therapy.  She states that it has worked so well that they were able to take her off the Ribavirin and the interferon.  She had a followup visit at [facility] in February.  We unfortunately do not have any notes.  She states that she feels fine.  Her vital signs are very stable, and her review of systems is negative.  She looks very good today.  She has no adenopathy.  Her lungs are clear. She has no petechiae and no ecchymoses.  Heart shows a regular rhythm and rate.  I did not hear murmur or gallop.  Abdomen is soft, nontender, without obvious hepatosplenomegaly.  Bowel sounds were diminished but present.  She has no peripheral edema.  So she looks very good.  She is due to be seen again next week so I did not do any blood work on her today.  We will just give her an appointment to see her back in 7-8 months,  sooner if need be.    ______________________________ Ladona Horns. Mariel Sleet, MD ESN/MEDQ  D:  08/06/2011  T:  08/06/2011  Job:  [id number]

## 2011-08-09 ENCOUNTER — Ambulatory Visit (HOSPITAL_COMMUNITY): Payer: 59 | Admitting: Oncology

## 2011-08-12 ENCOUNTER — Ambulatory Visit (HOSPITAL_COMMUNITY)
Admission: RE | Admit: 2011-08-12 | Discharge: 2011-08-12 | Disposition: A | Discharge status: Home / Self Care | Payer: 59 | Source: Ambulatory Visit | Attending: Obstetrics and Gynecology | Admitting: Obstetrics and Gynecology

## 2011-08-12 DIAGNOSIS — Z139 Encounter for screening, unspecified: Secondary | ICD-10-CM

## 2011-08-12 DIAGNOSIS — Z1231 Encounter for screening mammogram for malignant neoplasm of breast: Secondary | ICD-10-CM | POA: Insufficient documentation

## 2011-08-16 ENCOUNTER — Other Ambulatory Visit (HOSPITAL_COMMUNITY): Payer: Self-pay | Admitting: Oncology

## 2011-08-16 DIAGNOSIS — K7460 Unspecified cirrhosis of liver: Secondary | ICD-10-CM

## 2011-08-16 IMAGING — US US ABDOMEN COMPLETE
1 series · 14 of 25 positions shown · non-contrast
Comparison: 11/01/2003

CLINICAL DATA: Thrombocytopenia

ULTRASOUND ABDOMEN:
TECHNIQUE: Sonography of upper abdominal structures was performed.

[Series 1: us abdomen complete · 0.19mm/px · 14 of 71 slices shown]
[im 1/71]
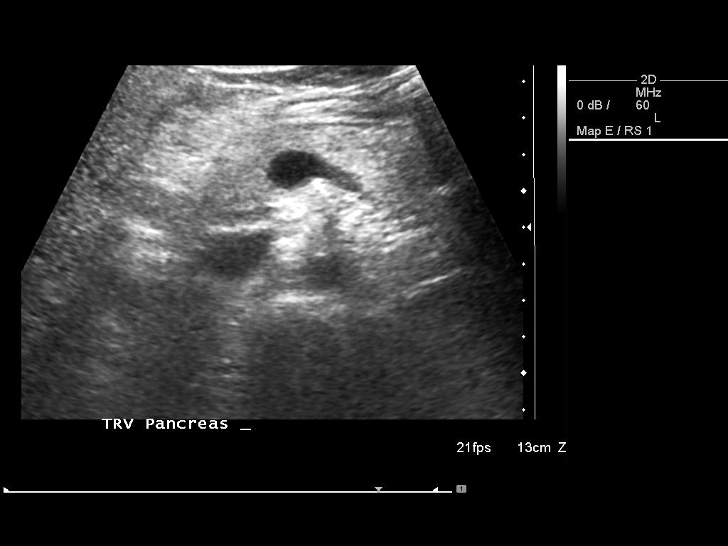
[im 6/71]
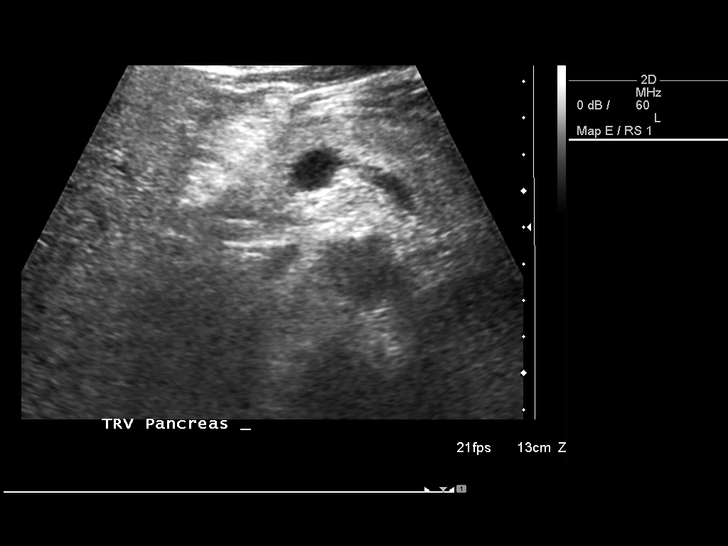
[im 12/71]
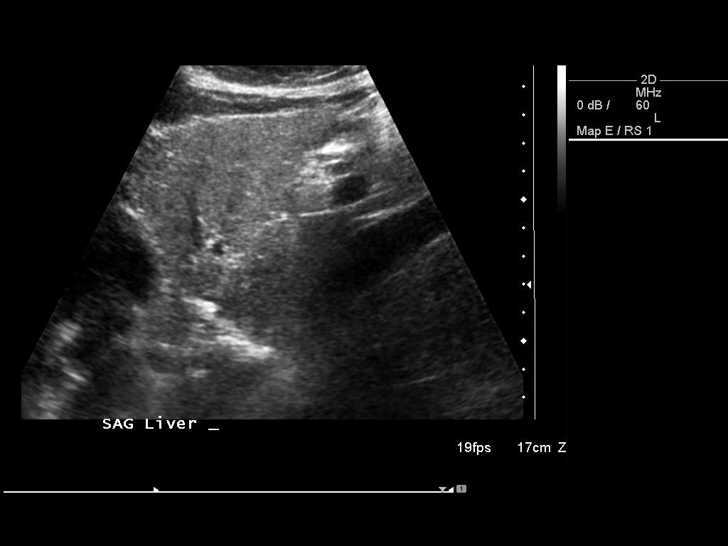
[im 18/71]
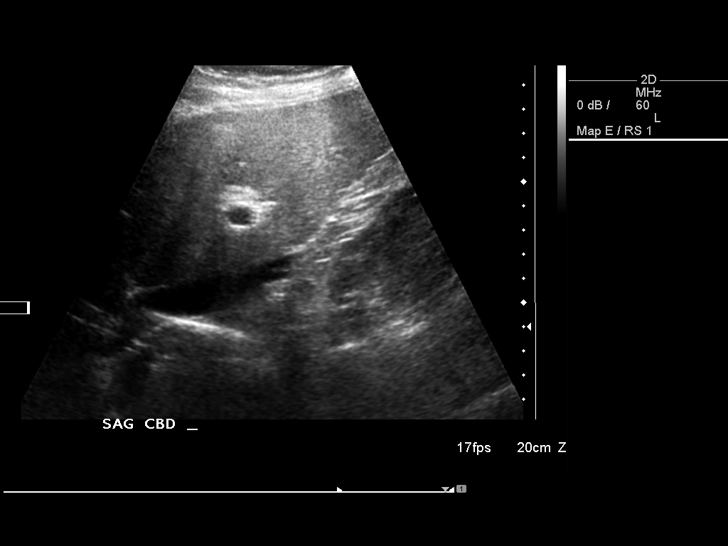
[im 24/71]
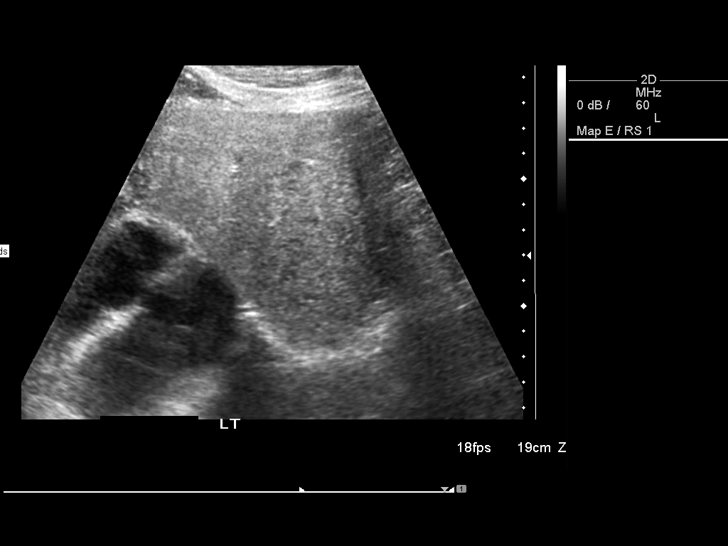
[im 27/71]
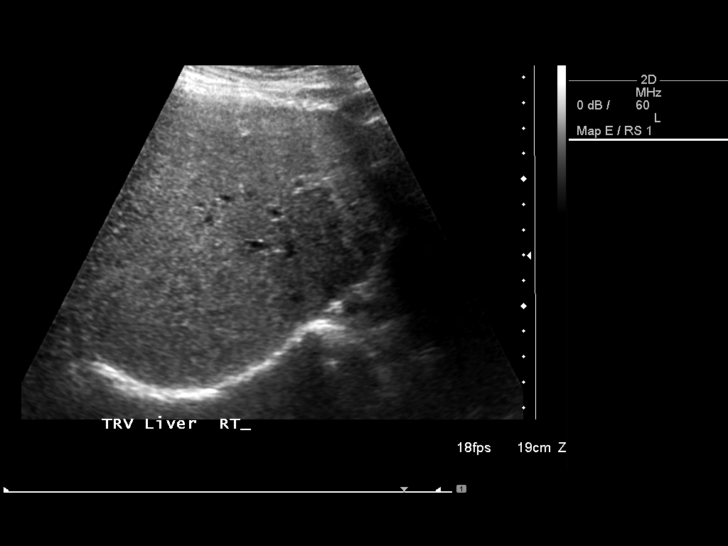
[im 33/71]
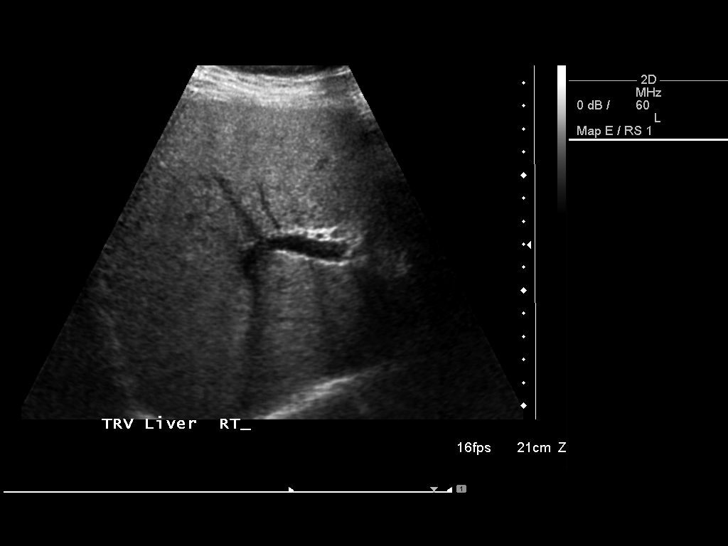
[im 38/71]
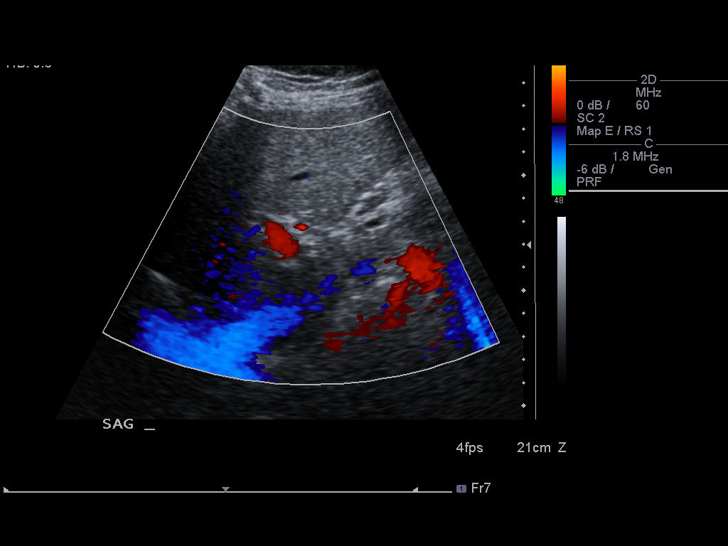
[im 44/71]
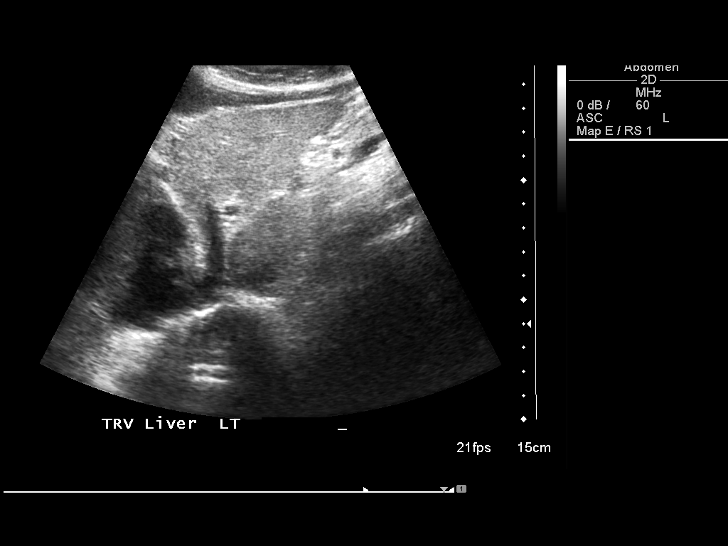
[im 47/71]
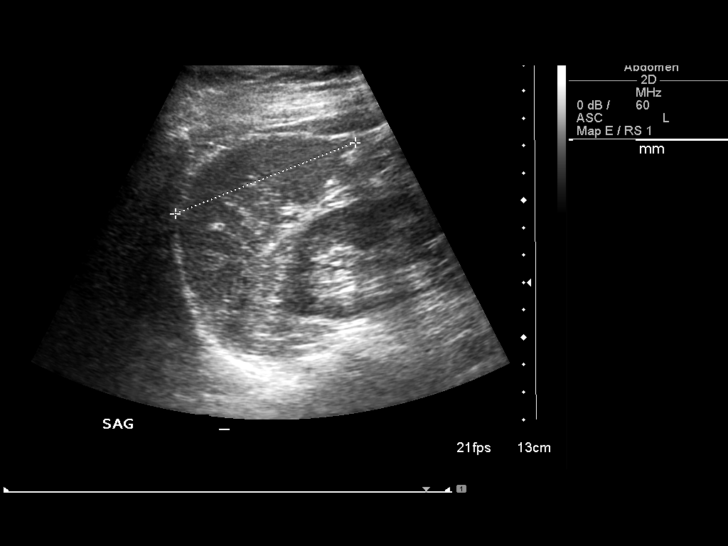
[im 53/71]
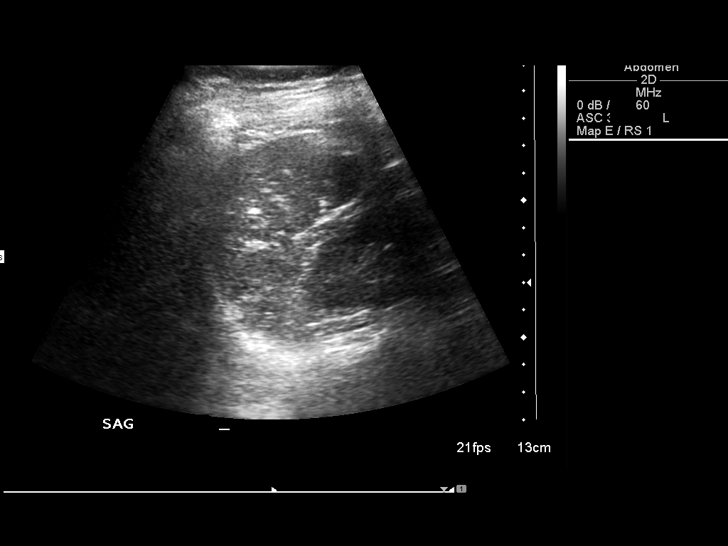
[im 59/71]
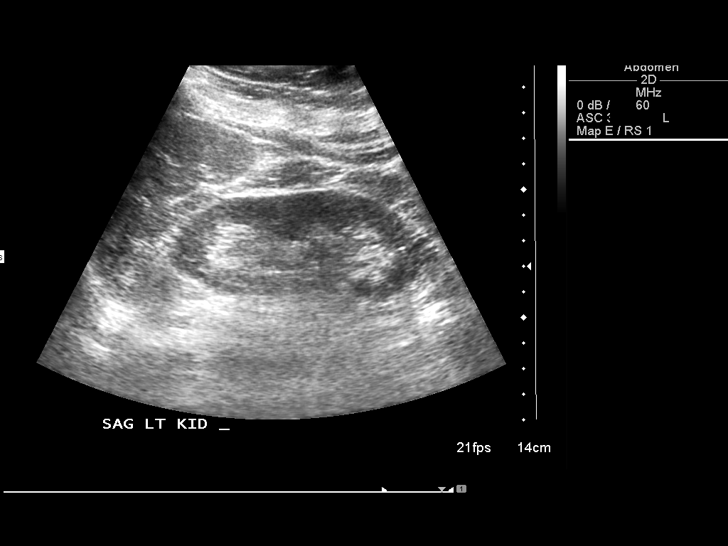
[im 65/71]
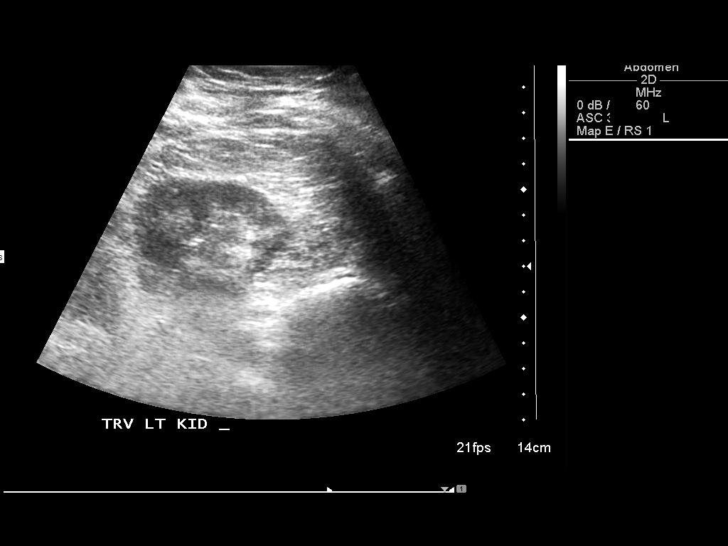
[im 71/71]
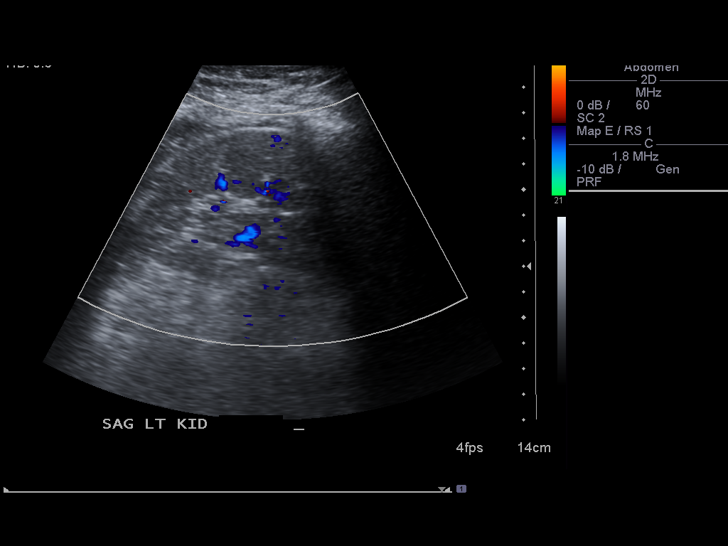

[14 of 25 positions shown; findings below may reference images not displayed]

Gallbladder:  Surgically absent

Common bile duct:  4 mm diameter.

Liver:  Echogenic slightly coarsened appearance, likely fatty
infiltration, though this can be seen with cirrhosis and certain
infiltrative disorders. No definite focal mass or nodularity.

IVC:  Unremarkable

Pancreas:  Normal appearance

Spleen:  Normal appearance, 7.1 cm length

Right kidney:  8.8 cm length.  Normal morphology without mass or
hydronephrosis.

Left kidney:  10.2 cm length.  Normal morphology without mass or
hydronephrosis.

Aorta:  Unremarkable

Other:  No free fluid
IMPRESSION: Echogenic liver, question fatty infiltration.
No definite acute upper abdominal abnormalities.

## 2011-08-23 ENCOUNTER — Ambulatory Visit (HOSPITAL_COMMUNITY)
Admission: RE | Admit: 2011-08-23 | Discharge: 2011-08-23 | Disposition: A | Discharge status: Home / Self Care | Payer: 59 | Source: Ambulatory Visit | Attending: Oncology | Admitting: Oncology

## 2011-08-23 DIAGNOSIS — K7460 Unspecified cirrhosis of liver: Secondary | ICD-10-CM | POA: Insufficient documentation

## 2011-08-23 DIAGNOSIS — Z9089 Acquired absence of other organs: Secondary | ICD-10-CM | POA: Insufficient documentation

## 2011-10-02 ENCOUNTER — Other Ambulatory Visit: Payer: Self-pay | Admitting: Obstetrics and Gynecology

## 2011-10-02 ENCOUNTER — Other Ambulatory Visit (HOSPITAL_COMMUNITY)
Admission: RE | Admit: 2011-10-02 | Discharge: 2011-10-02 | Disposition: A | Discharge status: Home / Self Care | Payer: 59 | Source: Ambulatory Visit | Attending: Obstetrics and Gynecology | Admitting: Obstetrics and Gynecology

## 2011-10-02 DIAGNOSIS — Z01419 Encounter for gynecological examination (general) (routine) without abnormal findings: Secondary | ICD-10-CM | POA: Insufficient documentation

## 2012-02-03 ENCOUNTER — Other Ambulatory Visit (HOSPITAL_COMMUNITY): Payer: Self-pay | Admitting: Oncology

## 2012-02-03 DIAGNOSIS — K7460 Unspecified cirrhosis of liver: Secondary | ICD-10-CM

## 2012-02-03 DIAGNOSIS — D696 Thrombocytopenia, unspecified: Secondary | ICD-10-CM

## 2012-02-03 DIAGNOSIS — B1920 Unspecified viral hepatitis C without hepatic coma: Secondary | ICD-10-CM

## 2012-02-03 MED ORDER — FOLIC ACID 1 MG PO TABS: 1.0000 mg | ORAL_TABLET | Freq: Every day | ORAL | Status: DC

## 2012-04-06 ENCOUNTER — Ambulatory Visit (HOSPITAL_COMMUNITY): Payer: 59 | Admitting: Oncology

## 2012-04-07 ENCOUNTER — Encounter (HOSPITAL_COMMUNITY): Payer: 59 | Attending: Oncology | Admitting: Oncology

## 2012-04-07 ENCOUNTER — Encounter (HOSPITAL_COMMUNITY): Payer: Self-pay | Admitting: Oncology

## 2012-04-07 VITALS — BP 127/78 | HR 91 | Temp 97.1°F | Resp 16 | Wt 123.5 lb

## 2012-04-07 DIAGNOSIS — D696 Thrombocytopenia, unspecified: Secondary | ICD-10-CM | POA: Insufficient documentation

## 2012-04-07 DIAGNOSIS — B1920 Unspecified viral hepatitis C without hepatic coma: Secondary | ICD-10-CM | POA: Insufficient documentation

## 2012-04-07 LAB — CBC WITH DIFFERENTIAL/PLATELET
HCT: 44.3 % (ref 36.0–46.0)
Hemoglobin: 14.8 g/dL (ref 12.0–15.0)
Lymphs Abs: 2.6 10*3/uL (ref 0.7–4.0)
MCH: 33.6 pg (ref 26.0–34.0)
Monocytes Absolute: 0.6 10*3/uL (ref 0.1–1.0)
Monocytes Relative: 8 % (ref 3–12)
Neutro Abs: 4.1 10*3/uL (ref 1.7–7.7)
Neutrophils Relative %: 56 % (ref 43–77)
RBC: 4.4 MIL/uL (ref 3.87–5.11)

## 2012-04-07 NOTE — Patient Instructions (Addendum)
.  [facility] Discharge Instructions  RECOMMENDATIONS MADE BY THE CONSULTANT AND ANY TEST RESULTS WILL BE SENT TO YOUR REFERRING PHYSICIAN.     Thank you for choosing [facility] to provide your oncology and hematology care.  To afford each patient quality time with our providers, please arrive at least 15 minutes before your scheduled appointment time.  With your help, our goal is to use those 15 minutes to complete the necessary work-up to ensure our physicians have the information they need to help with your evaluation and healthcare recommendations.    Effective January 1st, 2014, we ask that you re-schedule your appointment with our physicians should you arrive 10 or more minutes late for your appointment.  We strive to give you quality time with our providers, and arriving late affects you and other patients whose appointments are after yours.    Again, thank you for choosing [facility].  Our hope is that these requests will decrease the amount of time that you wait before being seen by our physicians.       _____________________________________________________________  I acknowledge that I have been informed and understand all the instructions given to me and received a copy. I do not have anymore questions at this time but understand that I may call the Cancer Center at [facility] at [phone] during business hours should I have any further questions or need assistance in obtaining follow-up care.    __________________________________________  _____________  __________ Signature of Patient or Authorized Representative            Date                   Time    __________________________________________ [employer] [facility] Special[facility]  Discharge Instructions  RECOMMENDATIONS MADE BY THE CONSULTANT AND ANY TEST RESULTS WILL BE SENT TO YOUR REFERRING DOCTOR.   You are doing very well. A prescription  for folic acid has been sent to your pharmacy. Return to clinic in 8 months to see doctor. Report any issues/concerns to clinic as needed.   I acknowledge that I have been informed and understand all the instructions given to me and received a copy. I do not have any more questions at this time, but understand that I may call the Specialty Clinic at [facility] at [phone] during business hours should I have any further questions or need assistance in obtaining follow-up care.    __________________________________________  _____________  __________ Signature of Patient or Authorized Representative            Date                   Time    __________________________________________ Nurse's Signature

## 2012-04-07 NOTE — Progress Notes (Signed)
Problem #1 hepatitis C with thrombocytopenia. She is doing well no longer on her antiviral therapy. She stopped that as of January 2013. She looks great feels great is working essentially full-time. She has no bleeding from her nose gums GU or GI tracts. Her physical exam reveals stable vital signs. She has no obvious splenomegaly. I cannot detect hepatomegaly. I can feel her liver edge however 1 cm below the costal margin on the right with inspiration. She has no petechiae. She has no adenopathy in the cervical, subclavicular, infraclavicular, or axillary areas. She has no arm or leg swelling.  We'll check a CBC and differential today and see her back in 8 months sooner if need be. She is due to be seen at [facility] in January by her hepatologist

## 2012-05-15 ENCOUNTER — Other Ambulatory Visit (HOSPITAL_COMMUNITY): Payer: Self-pay | Admitting: Oncology

## 2012-05-15 DIAGNOSIS — D649 Anemia, unspecified: Secondary | ICD-10-CM

## 2012-05-19 ENCOUNTER — Encounter (HOSPITAL_COMMUNITY): Payer: 59 | Attending: Oncology

## 2012-05-19 DIAGNOSIS — D649 Anemia, unspecified: Secondary | ICD-10-CM | POA: Insufficient documentation

## 2012-05-19 LAB — CBC WITH DIFFERENTIAL/PLATELET
Basophils Absolute: 0 10*3/uL (ref 0.0–0.1)
Basophils Relative: 0 % (ref 0–1)
Eosinophils Absolute: 0.1 10*3/uL (ref 0.0–0.7)
Eosinophils Relative: 1 % (ref 0–5)
HCT: 42 % (ref 36.0–46.0)
MCH: 34.3 pg — ABNORMAL HIGH (ref 26.0–34.0)
MCHC: 34.3 g/dL (ref 30.0–36.0)
Monocytes Absolute: 0.5 10*3/uL (ref 0.1–1.0)
Neutro Abs: 4.2 10*3/uL (ref 1.7–7.7)
RDW: 12.6 % (ref 11.5–15.5)

## 2012-05-19 LAB — FOLATE: Folate: >20 ng/mL

## 2012-05-19 LAB — COMPREHENSIVE METABOLIC PANEL
AST: 29 U/L (ref 0–37)
Albumin: 4.1 g/dL (ref 3.5–5.2)
Calcium: 9.2 mg/dL (ref 8.4–10.5)
Chloride: 110 mEq/L (ref 96–112)
Creatinine, Ser: 0.84 mg/dL (ref 0.50–1.10)
Total Protein: 7.8 g/dL (ref 6.0–8.3)

## 2012-05-19 LAB — VITAMIN B12: Vitamin B-12: 722 pg/mL (ref 211–911)

## 2012-05-19 LAB — IRON AND TIBC
Iron: 102 ug/dL (ref 42–135)
Saturation Ratios: 27 % (ref 20–55)
TIBC: 380 ug/dL (ref 250–470)
UIBC: 278 ug/dL (ref 125–400)

## 2012-05-19 NOTE — Progress Notes (Signed)
Labs drawn today for cbc/diff,cmp,b12,Iron and IBC,ferr

## 2012-06-22 ENCOUNTER — Other Ambulatory Visit (HOSPITAL_COMMUNITY): Payer: Self-pay | Admitting: Oncology

## 2012-10-07 ENCOUNTER — Encounter: Payer: Self-pay | Admitting: *Deleted

## 2012-10-08 ENCOUNTER — Other Ambulatory Visit (HOSPITAL_COMMUNITY)
Admission: RE | Admit: 2012-10-08 | Discharge: 2012-10-08 | Disposition: A | Discharge status: Home / Self Care | Payer: 59 | Source: Ambulatory Visit | Attending: Obstetrics and Gynecology | Admitting: Obstetrics and Gynecology

## 2012-10-08 ENCOUNTER — Other Ambulatory Visit: Payer: Self-pay | Admitting: Obstetrics and Gynecology

## 2012-10-08 ENCOUNTER — Ambulatory Visit (INDEPENDENT_AMBULATORY_CARE_PROVIDER_SITE_OTHER): Payer: 59

## 2012-10-08 ENCOUNTER — Ambulatory Visit (INDEPENDENT_AMBULATORY_CARE_PROVIDER_SITE_OTHER): Payer: 59 | Admitting: Obstetrics and Gynecology

## 2012-10-08 ENCOUNTER — Encounter: Payer: Self-pay | Admitting: Obstetrics and Gynecology

## 2012-10-08 VITALS — BP 118/70 | Ht 59.0 in | Wt 121.4 lb

## 2012-10-08 DIAGNOSIS — Z1151 Encounter for screening for human papillomavirus (HPV): Secondary | ICD-10-CM | POA: Insufficient documentation

## 2012-10-08 DIAGNOSIS — N950 Postmenopausal bleeding: Secondary | ICD-10-CM

## 2012-10-08 DIAGNOSIS — Z1212 Encounter for screening for malignant neoplasm of rectum: Secondary | ICD-10-CM

## 2012-10-08 DIAGNOSIS — Z01419 Encounter for gynecological examination (general) (routine) without abnormal findings: Secondary | ICD-10-CM | POA: Insufficient documentation

## 2012-10-08 LAB — HEMOCCULT GUIAC POC 1CARD (OFFICE): Fecal Occult Blood, POC: NEGATIVE

## 2012-10-08 NOTE — Patient Instructions (Addendum)
CONTINUE YOUR REGULAR HORMONE PILLS Notify us if bleeding continues

## 2012-10-08 NOTE — Progress Notes (Signed)
Patient ID: Latoya Bautista, female   DOB: Jan 12, 1971, 42 y.o.   MRN: [id number] S:Pt here today for annual exam, states had vaginal bleeding starting 08/08/2012 lasting 3 days    No prior episodes , is on estradiol and Prometrium, bled shortly after change from prempro. Had been on premarin since high school with provera   O:pt is s/p hep C, tx'd x 3 meds [facility] 2012, Pegasus/Ribavirin/???. Now viral load 0, no meds required. Physical Examination: General appearance - alert, well appearing, and in no distress and oriented to person, place, and time Mental status - alert, oriented to person, place, and time Abdomen - soft, nontender, nondistended, no masses or organomegaly Pelvic - normal external genitalia, vulva, vagina, cervix, uterus and adnexa Extremities - peripheral pulses normal, no pedal edema, no clubbing or cyanosis  Pap done Will schedule tv u/s Consider endo bx only if thickened endometrium  Ultrasound obtained, and normal uterus with 4.3 mm endometrial stripe, normal adnexae,  Discussed with patient , will not recommend biopsy.

## 2012-10-13 ENCOUNTER — Encounter: Payer: Self-pay | Admitting: Obstetrics and Gynecology

## 2012-10-15 ENCOUNTER — Other Ambulatory Visit: Payer: Self-pay | Admitting: Obstetrics and Gynecology

## 2012-10-15 ENCOUNTER — Other Ambulatory Visit (HOSPITAL_COMMUNITY): Payer: Self-pay | Admitting: Oncology

## 2012-12-07 ENCOUNTER — Encounter (HOSPITAL_COMMUNITY): Payer: 59 | Attending: Oncology | Admitting: Oncology

## 2012-12-07 ENCOUNTER — Encounter (HOSPITAL_COMMUNITY): Payer: Self-pay | Admitting: Oncology

## 2012-12-07 VITALS — BP 120/7 | HR 88 | Temp 97.4°F | Resp 16 | Wt 119.4 lb

## 2012-12-07 DIAGNOSIS — D696 Thrombocytopenia, unspecified: Secondary | ICD-10-CM

## 2012-12-07 DIAGNOSIS — K7460 Unspecified cirrhosis of liver: Secondary | ICD-10-CM

## 2012-12-07 DIAGNOSIS — B1920 Unspecified viral hepatitis C without hepatic coma: Secondary | ICD-10-CM

## 2012-12-07 NOTE — Progress Notes (Signed)
Cassell Smiles., MD [address] [place] [address]  Liver cirrhosis  Thrombocytopenia  Hepatitis C, without hepatic coma  CURRENT THERAPY: Observation  INTERVAL HISTORY: Latoya Bautista 42 y.o. female returns for  regular  visit for followup of hepatitis C with thrombocytopenia. She is doing well no longer on her antiviral therapy. She stopped that as of January 2013.  Her hepatologist is at [facility].  She is no longer on her hepatitis C antiviral medications as of Jan 2013.m  She remains in a remission from that standpoint.    She reports that she is doing well.  She denies any bleeding including blood in stool, black tarry stool, and hematuria.  She denies any gingival bleeding and hemoptysis.  She denies any easy bruisability and rashes.   She is still working full time and actually has an interview today at the [facility] which is an assisted living facility.   Her chart is reviewed at [facility] via CareEverywhere.  Her labs are reviewed from 11/04/2012 which shows a platelet count of 128,000.  WBC was 8 and Hgb was 14.6 g/dL.   Her last note from [facility] is noted and it appears that she will see them back in Jan 2015 timeframe and at that time she will be scheduled locally for an [place] of abdomen.   We will see her back in 8 months with lab work.    Past Medical History  Diagnosis Date  . Leukemia   . Cancer     tongue cancer  . Anemia   . Osteoporosis   . Seizures   . Thyroid disease   . Hepatitis C   . Shingles   . Hepatitis C 04/11/2011  . Liver cirrhosis 04/11/2011  . Thrombocytopenia 04/11/2011    has Hepatitis C; Liver cirrhosis; Thrombocytopenia; and Postmenopausal bleeding DUE TO hormone tx. on her problem list.     is allergic to adhesive; asparaginase derivatives; and elspar.  Ms. Belleville had no medications administered during this visit.  Past Surgical History  Procedure Laterality Date  . Cholecystectomy    . Liver biopsie      . Tongue cancer      surgical removal of area  . Bone marrow biopsy  05/2011  . Bone marrow transplant    . Tongue biopsy      Denies any headaches, dizziness, double vision, fevers, chills, night sweats, nausea, vomiting, diarrhea, constipation, chest pain, heart palpitations, shortness of breath, blood in stool, black tarry stool, urinary pain, urinary burning, urinary frequency, hematuria.   PHYSICAL EXAMINATION  ECOG PERFORMANCE STATUS: 0 - Asymptomatic  Filed Vitals:   12/07/12 0911  BP: 120/7  Pulse: 88  Temp: 97.4 F (36.3 C)  Resp: 16    GENERAL:alert, no distress, well nourished, well developed, comfortable, cooperative and smiling SKIN: skin color, texture, turgor are normal, no rashes or significant lesions HEAD: Normocephalic, No masses, lesions, tenderness or abnormalities EYES: normal, PERRLA, EOMI, Conjunctiva are pink and non-injected EARS: External ears normal OROPHARYNX:mucous membranes are moist  NECK: supple, no adenopathy, thyroid normal size, non-tender, without nodularity, no stridor, non-tender, trachea midline LYMPH:  no palpable lymphadenopathy, no hepatosplenomegaly BREAST:not examined LUNGS: clear to auscultation and percussion HEART: regular rate & rhythm, no murmurs, no gallops, S1 normal and S2 normal ABDOMEN:abdomen soft, non-tender, normal bowel sounds, no masses or organomegaly, liver edge palpated 1 cm below costophrenic margin at midclavicular line on deep inspiration and no hepatosplenomegaly BACK: Back  symmetric, no curvature., No CVA tenderness EXTREMITIES:less then 2 second capillary refill, no joint deformities, effusion, or inflammation, no edema, no skin discoloration, no clubbing, no cyanosis  NEURO: alert & oriented x 3 with fluent speech, no focal motor/sensory deficits, gait normal   LABORATORY DATA: CBC    Component Value Date/Time   WBC 8.3 05/19/2012 0845   RBC 4.20 05/19/2012 0845   RBC 3.78* 06/09/2009 1524   HGB 14.4  05/19/2012 0845   HCT 42.0 05/19/2012 0845   PLT 146* 05/19/2012 0845   MCV 100.0 05/19/2012 0845   MCH 34.3* 05/19/2012 0845   MCHC 34.3 05/19/2012 0845   RDW 12.6 05/19/2012 0845   LYMPHSABS 3.5 05/19/2012 0845   MONOABS 0.5 05/19/2012 0845   EOSABS 0.1 05/19/2012 0845   BASOSABS 0.0 05/19/2012 0845       ASSESSMENT:  1. Thrombocytopenia, secondary to #2 and #3. 2. Hepatitis C with thrombocytopenia. She is doing well no longer on her antiviral therapy. She stopped that as of January 2013.  3. Cirrhosis of Liver  Patient Active Problem List   Diagnosis Date Noted  . Postmenopausal bleeding DUE TO hormone tx. 10/08/2012  . Hepatitis C 04/11/2011  . Liver cirrhosis 04/11/2011  . Thrombocytopenia 04/11/2011    PLAN:  1. I personally reviewed and went over laboratory results with the patient. 2. Labs today and in 8 months: CBC 3. US Liver per [facility] at their discretion.  4. Chart reviewed 5. [facility] note accessed via Care Everywhere in addition to lab results.  6. Return in 8 months for follow-up.   THERAPY PLAN:  From a thrombocytopenia standpoint, she is very stable according to labs performed at [facility] on 11/04/2012.  She will continue to follow-up with hepatologist at [facility].  We will see her back in 8 months with repeat labs.   All questions were answered. The patient knows to call the clinic with any problems, questions or concerns. We can certainly see the patient much sooner if necessary.  Patient and plan discussed with Dr. Janese Banks and he is in agreement with the aforementioned.   Aubrey Voong

## 2012-12-07 NOTE — Patient Instructions (Signed)
[facility] Discharge Instructions  RECOMMENDATIONS MADE BY THE CONSULTANT AND ANY TEST RESULTS WILL BE SENT TO YOUR REFERRING PHYSICIAN.  EXAM FINDINGS BY THE PHYSICIAN TODAY AND SIGNS OR SYMPTOMS TO REPORT TO CLINIC OR PRIMARY PHYSICIAN: Exam findings as discussed by T. Kefalas, PA-C.  SPECIAL INSTRUCTIONS/FOLLOW-UP: 1.  Your labs from [facility] were review today - please return in 8 months for repeat labs. 2.  Return in 8 months to see T. Kefalas, PA-C as scheduled.  Thank you for choosing [facility] to provide your oncology and hematology care.  To afford each patient quality time with our providers, please arrive at least 15 minutes before your scheduled appointment time.  With your help, our goal is to use those 15 minutes to complete the necessary work-up to ensure our physicians have the information they need to help with your evaluation and healthcare recommendations.    Effective January 1st, 2014, we ask that you re-schedule your appointment with our physicians should you arrive 10 or more minutes late for your appointment.  We strive to give you quality time with our providers, and arriving late affects you and other patients whose appointments are after yours.    Again, thank you for choosing [facility].  Our hope is that these requests will decrease the amount of time that you wait before being seen by our physicians.       _____________________________________________________________  Should you have questions after your visit to [facility], please contact our office at [phone] between the hours of 8:30 a.m. and 5:00 p.m.  Voicemails left after 4:30 p.m. will not be returned until the following business day.  For prescription refill requests, have your pharmacy contact our office with your prescription refill request.

## 2012-12-07 NOTE — Addendum Note (Signed)
Addended by: Ellouise Newer on: 12/07/2012 10:22 AM   Modules accepted: Orders

## 2012-12-15 DIAGNOSIS — G40209 Localization-related (focal) (partial) symptomatic epilepsy and epileptic syndromes with complex partial seizures, not intractable, without status epilepticus: Secondary | ICD-10-CM | POA: Insufficient documentation

## 2013-01-01 ENCOUNTER — Encounter: Payer: Self-pay | Admitting: Diagnostic Neuroimaging

## 2013-01-01 ENCOUNTER — Ambulatory Visit (INDEPENDENT_AMBULATORY_CARE_PROVIDER_SITE_OTHER): Payer: 59 | Admitting: Diagnostic Neuroimaging

## 2013-01-01 VITALS — BP 112/71 | HR 90 | Temp 97.2°F | Ht 59.0 in | Wt 123.0 lb

## 2013-01-01 DIAGNOSIS — G40109 Localization-related (focal) (partial) symptomatic epilepsy and epileptic syndromes with simple partial seizures, not intractable, without status epilepticus: Secondary | ICD-10-CM

## 2013-01-01 NOTE — Progress Notes (Signed)
[facility]  PATIENT: Latoya Bautista DOB: Sep 27, 1970  REFERRING CLINICIAN: Elliot Dally HISTORY FROM: patient REASON FOR VISIT: new consult   HISTORICAL  CHIEF COMPLAINT:  Chief Complaint  Patient presents with  . Seizures    HISTORY OF PRESENT ILLNESS:   42 year old right-handed female here for evaluation of seizure disorder.  Patient born full term no complications with birth or delivery according to patient. By age 29 years old she was diagnosed with leukemia. She received bone marrow transplant treatment at age 4 and age 7 years old. She had convulsive seizures with each of these treatments. Following this she developed staring spells. He was started on Tegretol and Diamox but eventually her seizures became under control. Her last seizure was in 1995.  Since that time she's continued to have some abnormal sensations in her stomach and abdomen, which have been referred to as "abdominal auras".   Over the years she was maintained on Tegretol and Diamox. 2011 she was taken off of Tegretol due to diagnosis of hepatitis C. Fortunately she did not have any breakthrough seizures. However during this time she had more auras. Then she was started on levetiracetam, up to 1000 g twice a day. Her recently there has been some difference of opinion between patient and her neurologist about whether or not to stay on levetiracetam versus brand name Keppra versus extended release versus going back to Tegretol. As a result of this, patient was offered a second opinion with me.  At this point patient is taking levetiracetam XR 500 mg in the morning and 500 mg in the evening, although she was prescribed 1000 mg twice a day.   REVIEW OF SYSTEMS: Full 14 system review of systems performed and notable only for auras.  ALLERGIES: Allergies  Allergen Reactions  . Asparaginase Derivatives Rash  . Elspar [Asparaginase] Rash    HOME MEDICATIONS: Prior to Admission medications     Medication Sig Start Date End Date Taking? Authorizing Provider  estradiol (ESTRACE) 1 MG tablet TAKE 1 TABLET ONCE DAILY. 10/15/12  Yes Tilda Burrow, MD  fexofenadine (ALLEGRA) 180 MG tablet Take 180 mg by mouth. Take 180 mg by mouth. Frequency:PRN   Dosage:180   MG  Instructions:  Note:Dose: 180 MG 09/20/10  Yes Historical Provider, MD  folic acid (FOLVITE) 1 MG tablet TAKE 1 TABLET ONCE DAILY. 10/15/12  Yes Maurine Minister Kefalas, PA-C  levETIRAcetam (KEPPRA XR) 500 MG 24 hr tablet Take 1,000 mg by mouth 2 (two) times daily.   Yes Historical Provider, MD  levothyroxine (SYNTHROID, LEVOTHROID) 50 MCG tablet Take by mouth. Take 50 mcg by mouth daily. Take 50 mg daily every sat and sun   Yes Historical Provider, MD  Multiple Vitamins-Minerals (CENTRUM PO) Take 1 tablet by mouth daily.     Yes Historical Provider, MD  progesterone (PROMETRIUM) 100 MG capsule TAKE 1 CAPSULE ONCE DAILY X14 DAYS PER MONTH. 10/15/12  Yes Tilda Burrow, MD  sodium bicarbonate 325 MG tablet Take 325 mg by mouth 2 (two) times daily.   Yes Historical Provider, MD  vitamin C (ASCORBIC ACID) 500 MG tablet 500 mg. 500 mg. Frequency:QD   Dosage:500   MG  Instructions:  Note:Dose: 500MG  07/18/10  Yes Historical Provider, MD  zoledronic acid (RECLAST) 5 MG/100ML SOLN Inject 5 mg into the vein once. Once yearly   Yes Historical Provider, MD   Outpatient Prescriptions Prior to Visit  Medication Sig Dispense Refill  . estradiol (ESTRACE) 1 MG tablet TAKE  1 TABLET ONCE DAILY.  30 tablet  PRN  . folic acid (FOLVITE) 1 MG tablet TAKE 1 TABLET ONCE DAILY.  30 tablet  3  . Multiple Vitamins-Minerals (CENTRUM PO) Take 1 tablet by mouth daily.        . progesterone (PROMETRIUM) 100 MG capsule TAKE 1 CAPSULE ONCE DAILY X14 DAYS PER MONTH.  14 capsule  PRN  . sodium bicarbonate 325 MG tablet Take 325 mg by mouth 2 (two) times daily.      . zoledronic acid (RECLAST) 5 MG/100ML SOLN Inject 5 mg into the vein once. Once yearly      .  levETIRAcetam (KEPPRA) 500 MG tablet Take 1,000 mg by mouth 2 (two) times daily.      Marland Kitchen acetaZOLAMIDE (DIAMOX) 250 MG tablet Take 250 mg by mouth 3 (three) times daily. Take one tablet in the morning and two tablets in the evening.      Marland Kitchen Fexofenadine HCl (ALLEGRA PO) Take 1 tablet by mouth as needed.        Marland Kitchen levothyroxine (SYNTHROID, LEVOTHROID) 100 MCG tablet Take 75 mcg by mouth daily. 75 mcg Monday through Friday and 50 mcg on Saturday and Sunday.      . vitamin C (ASCORBIC ACID) 500 MG tablet Take 500 mg by mouth daily.         No facility-administered medications prior to visit.    PAST MEDICAL HISTORY: Past Medical History  Diagnosis Date  . Leukemia   . Cancer     tongue cancer  . Anemia   . Osteoporosis   . Seizures   . Thyroid disease   . Hepatitis C   . Shingles   . Hepatitis C 04/11/2011  . Liver cirrhosis 04/11/2011  . Thrombocytopenia 04/11/2011    PAST SURGICAL HISTORY: Past Surgical History  Procedure Laterality Date  . Cholecystectomy    . Liver biopsie    . Tongue cancer      surgical removal of area  . Bone marrow biopsy  05/2011  . Bone marrow transplant    . Tongue biopsy      FAMILY HISTORY: Family History  Problem Relation Age of Onset  . Hypertension Father   . Diabetes Maternal Uncle     SOCIAL HISTORY:  History   Social History  . Marital Status: Married    Spouse Name: [name]    Number of Children: 0  . Years of Education: 12th   Occupational History  .     Social History Main Topics  . Smoking status: Never Smoker   . Smokeless tobacco: Never Used  . Alcohol Use: No  . Drug Use: No  . Sexual Activity: No   Other Topics Concern  . Not on file   Social History Narrative   Patient lives at home with her spouse.   Caffeine Use: 16oz bottle of soda daily     PHYSICAL EXAM  Filed Vitals:   01/01/13 0820  BP: 112/71  Pulse: 90  Temp: 97.2 F (36.2 C)  TempSrc: Oral  Height: 4\' 11"  (1.499 m)  Weight: 123 lb (55.792  kg)    Not recorded    Body mass index is 24.83 kg/(m^2).  GENERAL EXAM: Patient is in no distress  CARDIOVASCULAR: Regular rate and rhythm, no murmurs, no carotid bruits  NEUROLOGIC: MENTAL STATUS: awake, alert, language fluent, comprehension intact, naming intact CRANIAL NERVE: no papilledema on fundoscopic exam, pupils equal and reactive to light, visual fields full to confrontation, extraocular muscles  intact, no nystagmus, facial sensation and strength symmetric, uvula midline, shoulder shrug symmetric, tongue midline. MOTOR: normal bulk and tone, full strength in the BUE, BLE SENSORY: normal and symmetric to light touch, pinprick, temperature, vibration and proprioception COORDINATION: finger-nose-finger, fine finger movements normal REFLEXES: deep tendon reflexes present and symmetric GAIT/STATION: narrow based gait; able to walk on toes, heels and tandem; romberg is negative   DIAGNOSTIC DATA (LABS, IMAGING, TESTING) - I reviewed patient records, labs, notes, testing and imaging myself where available.  Lab Results  Component Value Date   WBC 8.3 05/19/2012   HGB 14.4 05/19/2012   HCT 42.0 05/19/2012   MCV 100.0 05/19/2012   PLT 146* 05/19/2012      Component Value Date/Time   NA 140 05/19/2012 0845   K 3.7 05/19/2012 0845   CL 110 05/19/2012 0845   CO2 19 05/19/2012 0845   GLUCOSE 117* 05/19/2012 0845   BUN 14 05/19/2012 0845   CREATININE 0.84 05/19/2012 0845   CALCIUM 9.2 05/19/2012 0845   CALCIUM 8.2* 12/19/2009 1013   PROT 7.8 05/19/2012 0845   ALBUMIN 4.1 05/19/2012 0845   AST 29 05/19/2012 0845   ALT 24 05/19/2012 0845   ALKPHOS 86 05/19/2012 0845   BILITOT 0.5 05/19/2012 0845   GFRNONAA 85* 05/19/2012 0845   GFRAA >90 05/19/2012 0845   No results found for this basename: CHOL, HDL, LDLCALC, LDLDIRECT, TRIG, CHOLHDL   No results found for this basename: HGBA1C   Lab Results  Component Value Date   VITAMINB12 722 05/19/2012   Lab Results  Component Value Date    TSH 0.086* 12/18/2009     ASSESSMENT AND PLAN  42 y.o. year old female here with  seizure disorder since age 55 years old. No seizures since 1995. She still has abdominal auras.   PLAN: 1. Continue acetazolamide 200 mg in morning and 5 mg in the evening 2. Continue levetiracetam XR 1000 mg twice a day; I explained the patient to take this form of extended release medication once a day but patient prefers to take it twice a day  Return in about 6 months (around 07/03/2013) for with Edison Nasuti, MD 01/01/2013, 10:06 AM Certified in Neurology, Neurophysiology and Neuroimaging  [facility] [address], [address] [place], [place] [address] [phone]

## 2013-01-01 NOTE — Patient Instructions (Signed)
Continue current medications. 

## 2013-02-01 ENCOUNTER — Telehealth: Payer: Self-pay | Admitting: Diagnostic Neuroimaging

## 2013-02-02 ENCOUNTER — Telehealth: Payer: Self-pay | Admitting: Diagnostic Neuroimaging

## 2013-02-02 NOTE — Telephone Encounter (Signed)
Have patient come in for visit tomorrow (me or Larita Fife schedule).

## 2013-02-02 NOTE — Telephone Encounter (Signed)
I LMVM for pt re: the SE of keppra for her to return call.

## 2013-02-03 NOTE — Telephone Encounter (Signed)
I called pt and LMVM again.  I offered appt tomorrow to come in.  She is to call and have me paged.

## 2013-02-04 NOTE — Telephone Encounter (Signed)
I spoke to pt this am and offered appt to her today or tomorrow.  She would like this next week.  She stated that she thinks that keppra giving her rash, although she has been on this since [place].  It comes and goes.   I told her unusual that being on this for quite sometime and then transient.  Made appt next Tuesday at her request.

## 2013-02-09 ENCOUNTER — Encounter: Payer: Self-pay | Admitting: Nurse Practitioner

## 2013-02-09 ENCOUNTER — Ambulatory Visit (INDEPENDENT_AMBULATORY_CARE_PROVIDER_SITE_OTHER): Payer: 59 | Admitting: Nurse Practitioner

## 2013-02-09 VITALS — BP 120/78 | HR 100 | Temp 98.8°F | Ht 59.0 in | Wt 118.0 lb

## 2013-02-09 DIAGNOSIS — G40109 Localization-related (focal) (partial) symptomatic epilepsy and epileptic syndromes with simple partial seizures, not intractable, without status epilepticus: Secondary | ICD-10-CM

## 2013-02-09 NOTE — Patient Instructions (Addendum)
Continue current medicines for now.  Keep next scheduled appointment.

## 2013-02-09 NOTE — Progress Notes (Signed)
[facility]  PATIENT: Latoya Bautista DOB: Feb 19, 1971   REASON FOR VISIT: follow up HISTORY FROM: patient  HPI: 42 year old right-handed female here for evaluation of seizure disorder.  Patient born full term no complications with birth or delivery according to patient. By age 26 years old she was diagnosed with leukemia. She received bone marrow transplant treatment at age 13 and age 6 years old. She had convulsive seizures with each of these treatments. Following this she developed staring spells. He was started on Tegretol and Diamox but eventually her seizures became under control. Her last seizure was in 1995.  Since that time she's continued to have some abnormal sensations in her stomach and abdomen, which have been referred to as "abdominal auras".  Over the years she was maintained on Tegretol and Diamox. 2011 she was taken off of Tegretol due to diagnosis of hepatitis C. Fortunately she did not have any breakthrough seizures. However during this time she had more auras. Then she was started on levetiracetam, up to 1000 g twice a day. Her recently there has been some difference of opinion between patient and her neurologist about whether or not to stay on levetiracetam versus brand name Keppra versus extended release versus going back to Tegretol. As a result of this, patient was offered a second opinion with me.  At this point patient is taking levetiracetam XR 500 mg in the morning and 500 mg in the evening, although she was prescribed 1000 mg twice a day.   UPDATE 02/09/13 (LL):  Patient returns to office for sooner appointment with concerns that Levetiracetam is giving her a skin rash.  She called for appointment last week, but now in the office she states the rash has since disappeared.  She states that she is still getting  The "funny feeling" in her stomach, lasting a few seconds each time.  She has had about 12 episodes in the last week.  She states she is having  these more since being on Leviteracetam.  She states the only time in her life she did not have these episodes were when she was on Diamox and Tegretol.  REVIEW OF SYSTEMS: Full 14 system review of systems performed and notable only for:  Constitutional: N/A  Cardiovascular: N/A  Ear/Nose/Throat: N/A  Skin: rash Eyes: N/A  Respiratory: N/A  Gastroitestinal: N/A  Genitourinary: N/A Hematology/Lymphatic: N/A  Endocrine: N/A Musculoskeletal:N/A  Allergy/Immunology: N/A  Neurological: seizure auras Psychiatric: N/A Sleep: N/A   ALLERGIES: Allergies  Allergen Reactions  . Asparaginase Derivatives Rash  . Elspar [Asparaginase] Rash    HOME MEDICATIONS: Outpatient Prescriptions Prior to Visit  Medication Sig Dispense Refill  . estradiol (ESTRACE) 1 MG tablet TAKE 1 TABLET ONCE DAILY.  30 tablet  PRN  . fexofenadine (ALLEGRA) 180 MG tablet Take 180 mg by mouth. Take 180 mg by mouth. Frequency:PRN   Dosage:180   MG  Instructions:  Note:Dose: 180 MG      . folic acid (FOLVITE) 1 MG tablet TAKE 1 TABLET ONCE DAILY.  30 tablet  3  . levETIRAcetam (KEPPRA XR) 500 MG 24 hr tablet Take 1,000 mg by mouth 2 (two) times daily.      Marland Kitchen levothyroxine (SYNTHROID, LEVOTHROID) 50 MCG tablet Take 50 mcg by mouth. Take 50 mcg by mouth daily. Take 50 mg daily every sat and sun      . Multiple Vitamins-Minerals (CENTRUM PO) Take 1 tablet by mouth daily.        . progesterone (PROMETRIUM)  100 MG capsule TAKE 1 CAPSULE ONCE DAILY X14 DAYS PER MONTH.  14 capsule  PRN  . sodium bicarbonate 325 MG tablet Take 325 mg by mouth 2 (two) times daily.      . vitamin C (ASCORBIC ACID) 500 MG tablet 500 mg. 500 mg. Frequency:QD   Dosage:500   MG  Instructions:  Note:Dose: 500MG       . zoledronic acid (RECLAST) 5 MG/100ML SOLN Inject 5 mg into the vein once. Once yearly       . levothyroxine (SYNTHROID, LEVOTHROID) 75 MCG tablet    Sig: Take 75 mcg by mouth daily before breakfast. Mon., Tue., Wed., Thurs., Fri.      PAST MEDICAL HISTORY: Past Medical History  Diagnosis Date  . Leukemia   . Cancer     tongue cancer  . Anemia   . Osteoporosis   . Seizures   . Thyroid disease   . Hepatitis C   . Shingles   . Hepatitis C 04/11/2011  . Liver cirrhosis 04/11/2011  . Thrombocytopenia 04/11/2011    PAST SURGICAL HISTORY: Past Surgical History  Procedure Laterality Date  . Cholecystectomy    . Liver biopsie    . Tongue cancer      surgical removal of area  . Bone marrow biopsy  05/2011  . Bone marrow transplant    . Tongue biopsy      FAMILY HISTORY: Family History  Problem Relation Age of Onset  . Hypertension Father   . Diabetes Maternal Uncle     SOCIAL HISTORY: History   Social History  . Marital Status: Married    Spouse Name: [name]    Number of Children: 0  . Years of Education: 12th   Occupational History  .     Social History Main Topics  . Smoking status: Never Smoker   . Smokeless tobacco: Never Used  . Alcohol Use: No  . Drug Use: No  . Sexual Activity: No   Other Topics Concern  . Not on file   Social History Narrative   Patient lives at home with her spouse.   Caffeine Use: 16oz bottle of soda daily     PHYSICAL EXAM  Filed Vitals:   02/09/13 0934  BP: 120/78  Pulse: 100  Temp: 98.8 F (37.1 C)  TempSrc: Oral  Height: 4\' 11"  (1.499 m)  Weight: 118 lb (53.524 kg)   Body mass index is 23.82 kg/(m^2).  Generalized: Well developed, in no acute distress  Head: normocephalic and atraumatic. Oropharynx benign  Neck: Supple, no carotid bruits  Cardiac: Regular rate rhythm, no murmur  Musculoskeletal: No deformity   Neurological examination  MENTAL STATUS: awake, alert, language fluent, comprehension intact, naming intact  CRANIAL NERVE: no papilledema on fundoscopic exam, pupils equal and reactive to light, visual fields full to confrontation, extraocular muscles intact, no nystagmus, facial sensation and strength symmetric, uvula midline,  shoulder shrug symmetric, tongue midline.  MOTOR: normal bulk and tone, full strength in the BUE, BLE  SENSORY: normal and symmetric to light touch, pinprick, temperature, vibration and proprioception  COORDINATION: finger-nose-finger, fine finger movements normal  REFLEXES: deep tendon reflexes present and symmetric  GAIT/STATION: narrow based gait; able to walk on toes, heels and tandem; romberg is negative  DIAGNOSTIC DATA (LABS, IMAGING, TESTING) - I reviewed patient records, labs, notes, testing and imaging myself where available.  Lab Results  Component Value Date   WBC 8.3 05/19/2012   HGB 14.4 05/19/2012   HCT 42.0 05/19/2012  MCV 100.0 05/19/2012   PLT 146* 05/19/2012      Component Value Date/Time   NA 140 05/19/2012 0845   K 3.7 05/19/2012 0845   CL 110 05/19/2012 0845   CO2 19 05/19/2012 0845   GLUCOSE 117* 05/19/2012 0845   BUN 14 05/19/2012 0845   CREATININE 0.84 05/19/2012 0845   CALCIUM 9.2 05/19/2012 0845   CALCIUM 8.2* 12/19/2009 1013   PROT 7.8 05/19/2012 0845   ALBUMIN 4.1 05/19/2012 0845   AST 29 05/19/2012 0845   ALT 24 05/19/2012 0845   ALKPHOS 86 05/19/2012 0845   BILITOT 0.5 05/19/2012 0845   GFRNONAA 85* 05/19/2012 0845   GFRAA >90 05/19/2012 0845   No results found for this basename: CHOL,  HDL,  LDLCALC,  LDLDIRECT,  TRIG,  CHOLHDL   No results found for this basename: HGBA1C   Lab Results  Component Value Date   VITAMINB12 722 05/19/2012   Lab Results  Component Value Date   TSH 0.086* 12/18/2009    ASSESSMENT AND PLAN 43 y.o. year old female here with seizure disorder since age 37 years old. No seizures since 1995. She still has abdominal auras, seem to be more frequent on Levetiracetam XR.  Patient would like to switch from Leviteracetam back to Tegretol.  I do not feel comfortable doing so due to her history of Hep C and cirrhosis. I am advising that she goes back to Neurologist at [facility] where both her Neurologist and GI specialist are in the same place and  can work together.  We can continue and manage her here if she wants to continue Leviteracetam at the same dose.  I recommended she be evaluated again at an epilepsy center to better define these "stange abdominal feelings" and make sure they are truly seizure aura instead of another organic problem.  PLAN:  1. Continue acetazolamide 200 mg in morning and 500 mg in the evening 2. Continue levetiracetam XR 1000 mg twice a day; I explained the patient to take this form of extended release medication once a day but patient prefers to take it twice a day. Refilled Rx x 3.  Ronal Fear, MSN, NP-C 02/09/2013, 10:27 AM [facility] [address] [place], [place] [address] [phone]

## 2013-02-10 ENCOUNTER — Telehealth: Payer: Self-pay | Admitting: Nurse Practitioner

## 2013-02-10 MED ORDER — LEVETIRACETAM ER 500 MG PO TB24: 1000.0000 mg | ORAL_TABLET | Freq: Two times a day (BID) | ORAL | Status: DC

## 2013-02-10 NOTE — Telephone Encounter (Signed)
Left message for Latoya Bautista stating refill was sent to pharmacy for Leviteracetam XR at same dose.

## 2013-02-11 ENCOUNTER — Telehealth: Payer: Self-pay | Admitting: Diagnostic Neuroimaging

## 2013-02-12 NOTE — Telephone Encounter (Signed)
Please call patient and discuss with her what you see to be her options.  I had left her a message earlier in the week stating what we had discussed, and asking her to return to [facility].

## 2013-02-12 NOTE — Telephone Encounter (Signed)
Call pt about the medication that she is concern with, which is levetiracetam (keppra) 500 mg, 24 hr. Tablet. Pt states that this medication is not working for her. If Heide Guile, NP could see about giving her something else. Please call pt.

## 2013-02-20 ENCOUNTER — Other Ambulatory Visit (HOSPITAL_COMMUNITY): Payer: Self-pay | Admitting: Oncology

## 2013-02-22 ENCOUNTER — Other Ambulatory Visit (HOSPITAL_COMMUNITY): Payer: Self-pay | Admitting: Oncology

## 2013-02-22 NOTE — Progress Notes (Signed)
I reviewed note and agree with plan.   Suanne Marker, MD 02/22/2013, 11:15 PM Certified in Neurology, Neurophysiology and Neuroimaging  [facility] [address] [place], [place] [address] [phone]

## 2013-03-10 ENCOUNTER — Emergency Department (HOSPITAL_COMMUNITY)
Admission: EM | Admit: 2013-03-10 | Discharge: 2013-03-10 | Disposition: A | Discharge status: Home / Self Care | Payer: Worker's Compensation | Attending: Emergency Medicine | Admitting: Emergency Medicine

## 2013-03-10 ENCOUNTER — Encounter (HOSPITAL_COMMUNITY): Payer: Self-pay | Admitting: Emergency Medicine

## 2013-03-10 ENCOUNTER — Emergency Department (HOSPITAL_COMMUNITY): Payer: Worker's Compensation

## 2013-03-10 DIAGNOSIS — Y990 Civilian activity done for income or pay: Secondary | ICD-10-CM | POA: Insufficient documentation

## 2013-03-10 DIAGNOSIS — Z8719 Personal history of other diseases of the digestive system: Secondary | ICD-10-CM | POA: Insufficient documentation

## 2013-03-10 DIAGNOSIS — D649 Anemia, unspecified: Secondary | ICD-10-CM | POA: Insufficient documentation

## 2013-03-10 DIAGNOSIS — Y93F9 Activity, other caregiving: Secondary | ICD-10-CM | POA: Insufficient documentation

## 2013-03-10 DIAGNOSIS — S63501A Unspecified sprain of right wrist, initial encounter: Secondary | ICD-10-CM

## 2013-03-10 DIAGNOSIS — Z856 Personal history of leukemia: Secondary | ICD-10-CM | POA: Insufficient documentation

## 2013-03-10 DIAGNOSIS — Z8619 Personal history of other infectious and parasitic diseases: Secondary | ICD-10-CM | POA: Insufficient documentation

## 2013-03-10 DIAGNOSIS — S63509A Unspecified sprain of unspecified wrist, initial encounter: Secondary | ICD-10-CM | POA: Insufficient documentation

## 2013-03-10 DIAGNOSIS — Z85810 Personal history of malignant neoplasm of tongue: Secondary | ICD-10-CM | POA: Insufficient documentation

## 2013-03-10 DIAGNOSIS — Y9210 Unspecified residential institution as the place of occurrence of the external cause: Secondary | ICD-10-CM | POA: Insufficient documentation

## 2013-03-10 DIAGNOSIS — E079 Disorder of thyroid, unspecified: Secondary | ICD-10-CM | POA: Insufficient documentation

## 2013-03-10 DIAGNOSIS — G40909 Epilepsy, unspecified, not intractable, without status epilepticus: Secondary | ICD-10-CM | POA: Insufficient documentation

## 2013-03-10 DIAGNOSIS — M810 Age-related osteoporosis without current pathological fracture: Secondary | ICD-10-CM | POA: Insufficient documentation

## 2013-03-10 DIAGNOSIS — Z79899 Other long term (current) drug therapy: Secondary | ICD-10-CM | POA: Insufficient documentation

## 2013-03-10 DIAGNOSIS — X500XXA Overexertion from strenuous movement or load, initial encounter: Secondary | ICD-10-CM | POA: Insufficient documentation

## 2013-03-10 DIAGNOSIS — D696 Thrombocytopenia, unspecified: Secondary | ICD-10-CM | POA: Insufficient documentation

## 2013-03-10 MED ORDER — IBUPROFEN 600 MG PO TABS: 600.0000 mg | ORAL_TABLET | Freq: Four times a day (QID) | ORAL | Status: DC | PRN

## 2013-03-10 NOTE — ED Notes (Signed)
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 Happened around 4 pm at [facility] per pt.

## 2013-03-12 NOTE — ED Provider Notes (Signed)
CSN: [id number]     Arrival date & time 03/10/13  2059 History   First MD Initiated Contact with Patient 03/10/13 2119     Chief Complaint  Patient presents with  . Wrist Pain  . Arm Injury   (Consider location/radiation/quality/duration/timing/severity/associated sxs/prior Treatment) HPI Comments: KHAMYA TOPP is a 42 y.o. 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  She reports twisting the wrist in extension and now has pain with movement and palpation.  The pain is resolved at rest, worsened with ROM,  Especially extension with radiation into her lower dorsal forearm.  She has had no treatment prior to arrival and denies prior injury to this joint.     The history is provided by the patient.    Past Medical History  Diagnosis Date  . Leukemia   . Cancer     tongue cancer  . Anemia   . Osteoporosis   . Seizures   . Thyroid disease   . Hepatitis C   . Shingles   . Hepatitis C 04/11/2011  . Liver cirrhosis 04/11/2011  . Thrombocytopenia 04/11/2011   Past Surgical History  Procedure Laterality Date  . Cholecystectomy    . Liver biopsie    . Tongue cancer      surgical removal of area  . Bone marrow biopsy  05/2011  . Bone marrow transplant    . Tongue biopsy     Family History  Problem Relation Age of Onset  . Hypertension Father   . Diabetes Maternal Uncle    History  Substance Use Topics  . Smoking status: Never Smoker   . Smokeless tobacco: Never Used  . Alcohol Use: No   OB History   Grav Para Term Preterm Abortions TAB SAB Ect Mult Living                 Review of Systems  Constitutional: Negative for fever.  Musculoskeletal: Positive for arthralgias and joint swelling. Negative for myalgias.  Neurological: Negative for weakness and numbness.    Allergies  Asparaginase derivatives and Elspar  Home  Medications   Current Outpatient Rx  Name  Route  Sig  Dispense  Refill  . acetaZOLAMIDE (DIAMOX) 250 MG tablet   Oral   Take 250-500 mg by mouth 2 (two) times daily. Takes 1 in the morning, 2 at night.         . estradiol (ESTRACE) 1 MG tablet   Oral   Take 1 mg by mouth daily.         . fexofenadine (ALLEGRA) 180 MG tablet   Oral   Take 180 mg by mouth daily as needed. allergies         . folic acid (FOLVITE) 1 MG tablet   Oral   Take 1 mg by mouth daily.         Marland Kitchen levETIRAcetam (KEPPRA XR) 500 MG 24 hr tablet   Oral   Take 2 tablets (1,000 mg total) by mouth 2 (two) times daily.   60 tablet   5   . levothyroxine (SYNTHROID, LEVOTHROID) 50 MCG tablet   Oral   Take 50 mcg by mouth. Take 50 mcg by mouth daily. Take 50 mg daily every sat and sun         . levothyroxine (SYNTHROID, LEVOTHROID) 75 MCG tablet   Oral   Take  75 mcg by mouth daily before breakfast. Mon., Tue., Wed., Thurs., Fri.         . Multiple Vitamins-Minerals (CENTRUM PO)   Oral   Take 1 tablet by mouth daily.           . progesterone (PROMETRIUM) 100 MG capsule   Oral   Take 100 mg by mouth daily.         . sodium bicarbonate 325 MG tablet   Oral   Take 325 mg by mouth 2 (two) times daily.         . vitamin C (ASCORBIC ACID) 500 MG tablet   Oral   Take 500 mg by mouth daily.          Marland Kitchen ibuprofen (ADVIL,MOTRIN) 600 MG tablet   Oral   Take 1 tablet (600 mg total) by mouth every 6 (six) hours as needed.   15 tablet   0   . zoledronic acid (RECLAST) 5 MG/100ML SOLN   Intravenous   Inject 5 mg into the vein once. Once yearly          BP 152/72  Pulse 90  Temp(Src) 98.4 F (36.9 C) (Oral)  Resp 20  Ht 4\' 11"  (1.499 m)  Wt 114 lb (51.71 kg)  BMI 23.01 kg/m2  SpO2 100% Physical Exam  Constitutional: She appears well-developed and well-nourished.  HENT:  Head: Atraumatic.  Neck: Normal range of motion.  Cardiovascular:  Pulses equal bilaterally   Musculoskeletal: She exhibits tenderness. She exhibits no edema.       Right wrist: She exhibits tenderness. She exhibits normal range of motion, no swelling, no effusion and no crepitus.  Pain with palpation along right dorsal wrist and carpals.  No edema,  No snuffbox tenderness. Radial pulse full with less than 3 sec cap refill. Elbow and forearm nontender.  Neurological: She is alert. She has normal strength. She displays normal reflexes. No sensory deficit.  Equal strength  Skin: Skin is warm and dry.  Psychiatric: She has a normal mood and affect.    ED Course  Procedures (including critical care time) Labs Review Labs Reviewed - No data to display Imaging Review Dg Wrist Complete Right  03/10/2013   CLINICAL DATA:  Injury to the right wrist complaining of wrist pain.  EXAM: RIGHT WRIST - COMPLETE 3+ VIEW  COMPARISON:  No priors.  FINDINGS: Multiple views of the right wrist demonstrate no acute displaced fracture, subluxation, dislocation, or soft tissue abnormality.  IMPRESSION: No acute radiographic abnormality of the right wrist.   Electronically Signed   By: Trudie Reed M.D.   On: 03/10/2013 21:37    EKG Interpretation   None       MDM   1. Wrist sprain, right, initial encounter    Patients labs and/or radiological studies were viewed and considered during the medical decision making and disposition process. Pt was placed in velcro wrist splint,  Prescribed ibuprofen,  RICE,  F/u with ortho if not improved over the next 10-12 days, referral given.    Burgess Amor, PA-C 03/12/13 1426

## 2013-03-16 NOTE — ED Provider Notes (Signed)
Medical screening examination/treatment/procedure(s) were performed by non-physician practitioner and as supervising physician I was immediately available for consultation/collaboration.  EKG Interpretation   None        Hilaria Titsworth M Harla Mensch, MD 03/16/13 0905 

## 2013-04-05 ENCOUNTER — Telehealth: Payer: Self-pay | Admitting: Diagnostic Neuroimaging

## 2013-04-15 ENCOUNTER — Telehealth: Payer: Self-pay | Admitting: Diagnostic Neuroimaging

## 2013-04-15 NOTE — Telephone Encounter (Signed)
Spoke to patient. She says she is having "episodes" 1-4 time dailys, so she believes. She is requesting to change sz meds back to what they were. I advised patient according to the last OV assessment and plan. Verified w/  NP-Lynn Lam. Patient says she will contact [facility] again for Neurology and GI care.

## 2013-05-17 ENCOUNTER — Encounter: Payer: Self-pay | Admitting: Nurse Practitioner

## 2013-05-26 ENCOUNTER — Other Ambulatory Visit (HOSPITAL_COMMUNITY): Payer: Self-pay | Admitting: Oncology

## 2013-05-26 ENCOUNTER — Other Ambulatory Visit: Payer: Self-pay

## 2013-05-26 DIAGNOSIS — G40219 Localization-related (focal) (partial) symptomatic epilepsy and epileptic syndromes with complex partial seizures, intractable, without status epilepticus: Secondary | ICD-10-CM

## 2013-05-26 MED ORDER — ACETAZOLAMIDE 250 MG PO TABS: ORAL_TABLET | ORAL | Status: DC

## 2013-05-27 ENCOUNTER — Encounter (HOSPITAL_COMMUNITY): Payer: 59 | Attending: Hematology and Oncology

## 2013-05-27 ENCOUNTER — Other Ambulatory Visit (HOSPITAL_COMMUNITY): Payer: Self-pay | Admitting: Oncology

## 2013-05-27 DIAGNOSIS — G40209 Localization-related (focal) (partial) symptomatic epilepsy and epileptic syndromes with complex partial seizures, not intractable, without status epilepticus: Secondary | ICD-10-CM | POA: Insufficient documentation

## 2013-05-27 DIAGNOSIS — E876 Hypokalemia: Secondary | ICD-10-CM

## 2013-05-27 DIAGNOSIS — D696 Thrombocytopenia, unspecified: Secondary | ICD-10-CM

## 2013-05-27 DIAGNOSIS — G40219 Localization-related (focal) (partial) symptomatic epilepsy and epileptic syndromes with complex partial seizures, intractable, without status epilepticus: Secondary | ICD-10-CM

## 2013-05-27 LAB — CBC
HEMATOCRIT: 43.5 % (ref 36.0–46.0)
Hemoglobin: 14.5 g/dL (ref 12.0–15.0)
MCH: 33.6 pg (ref 26.0–34.0)
MCHC: 33.3 g/dL (ref 30.0–36.0)
MCV: 100.7 fL — AB (ref 78.0–100.0)
PLATELETS: 166 10*3/uL (ref 150–400)
RBC: 4.32 MIL/uL (ref 3.87–5.11)
RDW: 12.8 % (ref 11.5–15.5)
WBC: 8.8 10*3/uL (ref 4.0–10.5)

## 2013-05-27 LAB — COMPREHENSIVE METABOLIC PANEL
ALT: 22 U/L (ref 0–35)
AST: 27 U/L (ref 0–37)
Albumin: 4 g/dL (ref 3.5–5.2)
Alkaline Phosphatase: 74 U/L (ref 39–117)
BILIRUBIN TOTAL: 0.3 mg/dL (ref 0.3–1.2)
BUN: 19 mg/dL (ref 6–23)
CHLORIDE: 109 meq/L (ref 96–112)
CO2: 21 mEq/L (ref 19–32)
CREATININE: 0.89 mg/dL (ref 0.50–1.10)
Calcium: 9.6 mg/dL (ref 8.4–10.5)
GFR calc Af Amer: >90 mL/min (ref >90)
GFR calc non Af Amer: 79 mL/min — ABNORMAL LOW (ref >90)
Glucose, Bld: 63 mg/dL — ABNORMAL LOW (ref 70–99)
Potassium: 3.6 mEq/L — ABNORMAL LOW (ref 3.7–5.3)
Sodium: 144 mEq/L (ref 137–147)
Total Protein: 8.1 g/dL (ref 6.0–8.3)

## 2013-05-27 MED ORDER — POTASSIUM CHLORIDE CRYS ER 20 MEQ PO TBCR: 20.0000 meq | EXTENDED_RELEASE_TABLET | Freq: Every day | ORAL | Status: DC

## 2013-05-27 NOTE — Progress Notes (Signed)
Labs drawn today for cbc,cmp

## 2013-06-23 ENCOUNTER — Other Ambulatory Visit: Payer: Self-pay | Admitting: Obstetrics and Gynecology

## 2013-06-23 ENCOUNTER — Encounter (HOSPITAL_COMMUNITY): Payer: 59 | Attending: Hematology and Oncology

## 2013-06-23 DIAGNOSIS — Z1231 Encounter for screening mammogram for malignant neoplasm of breast: Secondary | ICD-10-CM

## 2013-06-23 DIAGNOSIS — G40219 Localization-related (focal) (partial) symptomatic epilepsy and epileptic syndromes with complex partial seizures, intractable, without status epilepticus: Secondary | ICD-10-CM | POA: Insufficient documentation

## 2013-06-23 DIAGNOSIS — D696 Thrombocytopenia, unspecified: Secondary | ICD-10-CM

## 2013-06-23 LAB — CBC
HCT: 41.5 % (ref 36.0–46.0)
HEMOGLOBIN: 14.1 g/dL (ref 12.0–15.0)
MCH: 34.2 pg — AB (ref 26.0–34.0)
MCHC: 34 g/dL (ref 30.0–36.0)
MCV: 100.7 fL — AB (ref 78.0–100.0)
PLATELETS: 136 10*3/uL — AB (ref 150–400)
RBC: 4.12 MIL/uL (ref 3.87–5.11)
RDW: 13.1 % (ref 11.5–15.5)
WBC: 7 10*3/uL (ref 4.0–10.5)

## 2013-06-23 LAB — COMPREHENSIVE METABOLIC PANEL
ALT: 20 U/L (ref 0–35)
AST: 22 U/L (ref 0–37)
Albumin: 3.8 g/dL (ref 3.5–5.2)
Alkaline Phosphatase: 72 U/L (ref 39–117)
BILIRUBIN TOTAL: 0.3 mg/dL (ref 0.3–1.2)
BUN: 16 mg/dL (ref 6–23)
CHLORIDE: 109 meq/L (ref 96–112)
CO2: 21 meq/L (ref 19–32)
CREATININE: 0.82 mg/dL (ref 0.50–1.10)
Calcium: 9.3 mg/dL (ref 8.4–10.5)
GFR, EST NON AFRICAN AMERICAN: 87 mL/min — AB (ref >90)
GLUCOSE: 102 mg/dL — AB (ref 70–99)
Potassium: 3.9 mEq/L (ref 3.7–5.3)
SODIUM: 143 meq/L (ref 137–147)
Total Protein: 7.5 g/dL (ref 6.0–8.3)

## 2013-06-23 NOTE — Progress Notes (Signed)
Labs drawn today for cbc,cmp

## 2013-07-05 ENCOUNTER — Ambulatory Visit: Payer: 59 | Admitting: Nurse Practitioner

## 2013-07-05 ENCOUNTER — Ambulatory Visit (HOSPITAL_COMMUNITY)
Admission: RE | Admit: 2013-07-05 | Discharge: 2013-07-05 | Disposition: A | Discharge status: Home / Self Care | Payer: 59 | Source: Ambulatory Visit | Attending: Obstetrics and Gynecology | Admitting: Obstetrics and Gynecology

## 2013-07-05 DIAGNOSIS — Z1231 Encounter for screening mammogram for malignant neoplasm of breast: Secondary | ICD-10-CM

## 2013-07-07 ENCOUNTER — Ambulatory Visit: Payer: 59 | Admitting: Nurse Practitioner

## 2013-07-21 ENCOUNTER — Other Ambulatory Visit (HOSPITAL_COMMUNITY): Payer: Self-pay

## 2013-07-30 ENCOUNTER — Other Ambulatory Visit (HOSPITAL_COMMUNITY): Payer: Self-pay | Admitting: Oncology

## 2013-07-30 DIAGNOSIS — K7460 Unspecified cirrhosis of liver: Secondary | ICD-10-CM

## 2013-07-30 MED ORDER — FOLIC ACID 1 MG PO TABS: 1.0000 mg | ORAL_TABLET | Freq: Every day | ORAL | Status: DC

## 2013-08-06 NOTE — Progress Notes (Signed)
Latoya Cahill, MD  [place] [address]  Thrombocytopenia - Plan: CBC with Differential  Liver cirrhosis - Plan: AFP tumor marker, AFP tumor marker  Hepatitis C - Plan: AFP tumor marker, AFP tumor marker  CURRENT THERAPY: Observation  INTERVAL HISTORY: Latoya Bautista 43 y.o. female returns for  regular  visit for followup of hepatitis C with thrombocytopenia. She is doing well no longer on her antiviral therapy. She stopped that as of January 2013. Her hepatologist is at [facility].  I personally reviewed and went over laboratory results with the patient.  The results are noted within this dictation.  I personally reviewed and went over radiographic studies with the patient.  The results are noted within this dictation.  Mammogram on 3/3/215 was BIRADS 1 and she will be due next year in March 2016 for her next screening mammogram.  Her antiseizure medications have been changed and altered by her neurologist.  Hematologically, the patient denies any complaints and ROS questioning is negative. She is to continue followup with [facility] as directed.  Past Medical History  Diagnosis Date  . Leukemia   . Cancer     tongue cancer  . Anemia   . Osteoporosis   . Seizures   . Thyroid disease   . Hepatitis C   . Shingles   . Hepatitis C 04/11/2011  . Liver cirrhosis 04/11/2011  . Thrombocytopenia 04/11/2011    has Hepatitis C; Liver cirrhosis; Thrombocytopenia; Postmenopausal bleeding DUE TO hormone tx.; Localization-related epilepsy; and Partial epilepsy with impairment of consciousness on her problem list.     is allergic to asparaginase derivatives and elspar.  Latoya Bautista does not currently have medications on file.  Past Surgical History  Procedure Laterality Date  . Cholecystectomy    . Liver biopsie    . Tongue cancer      surgical removal of area  . Bone marrow biopsy  05/2011  . Bone marrow transplant    . Tongue biopsy      Denies any headaches,  dizziness, double vision, fevers, chills, night sweats, nausea, vomiting, diarrhea, constipation, chest pain, heart palpitations, shortness of breath, blood in stool, black tarry stool, urinary pain, urinary burning, urinary frequency, hematuria.   PHYSICAL EXAMINATION  ECOG PERFORMANCE STATUS: 0 - Asymptomatic  Filed Vitals:   08/09/13 0900  BP: 116/77  Pulse: 96  Temp: 98.2 F (36.8 C)  Resp: 20    GENERAL:alert, no distress, well nourished, well developed, comfortable, cooperative and smiling SKIN: skin color, texture, turgor are normal, no rashes or significant lesions HEAD: Normocephalic, No masses, lesions, tenderness or abnormalities EYES: normal, PERRLA, EOMI, Conjunctiva are pink and non-injected EARS: External ears normal OROPHARYNX:mucous membranes are moist  NECK: supple, no adenopathy, thyroid normal size, non-tender, without nodularity, no stridor, non-tender, trachea midline LYMPH:  no palpable lymphadenopathy BREAST:not examined LUNGS: clear to auscultation  HEART: regular rate & rhythm, no murmurs and no gallops ABDOMEN:abdomen soft, non-tender, normal bowel sounds, no masses or organomegaly and no hepatosplenomegaly BACK: Back symmetric, no curvature. EXTREMITIES:less then 2 second capillary refill, no joint deformities, effusion, or inflammation, no skin discoloration, no clubbing, no cyanosis  NEURO: alert & oriented x 3 with fluent speech, no focal motor/sensory deficits, gait normal   LABORATORY DATA: CBC    Component Value Date/Time   WBC 7.0 06/23/2013 0906   RBC 4.12 06/23/2013 0906   RBC 3.78* 06/09/2009 1524   HGB 14.1 06/23/2013 0906   HCT  41.5 06/23/2013 0906   PLT 136* 06/23/2013 0906   MCV 100.7* 06/23/2013 0906   MCH 34.2* 06/23/2013 0906   MCHC 34.0 06/23/2013 0906   RDW 13.1 06/23/2013 0906   LYMPHSABS 3.5 05/19/2012 0845   MONOABS 0.5 05/19/2012 0845   EOSABS 0.1 05/19/2012 0845   BASOSABS 0.0 05/19/2012 0845      Chemistry      Component  Value Date/Time   NA 143 06/23/2013 0906   K 3.9 06/23/2013 0906   CL 109 06/23/2013 0906   CO2 21 06/23/2013 0906   BUN 16 06/23/2013 0906   CREATININE 0.82 06/23/2013 0906      Component Value Date/Time   CALCIUM 9.3 06/23/2013 0906   CALCIUM 8.2* 12/19/2009 1013   ALKPHOS 72 06/23/2013 0906   AST 22 06/23/2013 0906   ALT 20 06/23/2013 0906   BILITOT 0.3 06/23/2013 0906       RADIOGRAPHIC STUDIES:  07/06/2013  CLINICAL DATA: Screening.  EXAM:  DIGITAL SCREENING BILATERAL MAMMOGRAM WITH CAD  COMPARISON: Previous exam(s).  ACR Breast Density Category b: There are scattered areas of  fibroglandular density.  FINDINGS:  There are no findings suspicious for malignancy. Images were  processed with CAD.  IMPRESSION:  No mammographic evidence of malignancy. A result letter of this  screening mammogram will be mailed directly to the patient.  RECOMMENDATION:  Screening mammogram in one year. (Code:SM-B-01Y)  BI-RADS CATEGORY 1: Negative.  Electronically Signed  By: Lovey Newcomer M.D.  On: 07/06/2013 10:06    ASSESSMENT:  1. Thrombocytopenia, secondary to #2 and #3.  2. Hepatitis C with thrombocytopenia. She is doing well no longer on her antiviral therapy. She stopped that as of January 2013.  3. Cirrhosis of Liver  Patient Active Problem List   Diagnosis Date Noted  . Localization-related epilepsy 01/01/2013  . Partial epilepsy with impairment of consciousness 12/15/2012  . Postmenopausal bleeding DUE TO hormone tx. 10/08/2012  . Hepatitis C 04/11/2011  . Liver cirrhosis 04/11/2011  . Thrombocytopenia 04/11/2011     PLAN:  1. I personally reviewed and went over laboratory results with the patient.  The results are noted within this dictation. 2. I personally reviewed and went over radiographic studies with the patient.  The results are noted within this dictation.   3. Next screening mammogram is due in March 2016. 4. Labs in 8 months: CBC diff, AFP 5. Return in 8 months for  follow-up   THERAPY PLAN:  From a hematologic standpoint, the patient's laboratory work is stable. Her labs from today are pending.   All questions were answered. The patient knows to call the clinic with any problems, questions or concerns. We can certainly see the patient much sooner if necessary.  Patient and plan discussed with Dr. Farrel Gobble and he is in agreement with the aforementioned.   Latoya Bautista 08/09/2013

## 2013-08-09 ENCOUNTER — Encounter (HOSPITAL_BASED_OUTPATIENT_CLINIC_OR_DEPARTMENT_OTHER): Payer: 59

## 2013-08-09 ENCOUNTER — Encounter (HOSPITAL_COMMUNITY): Payer: Self-pay | Admitting: Oncology

## 2013-08-09 ENCOUNTER — Encounter (HOSPITAL_COMMUNITY): Payer: 59 | Attending: Hematology and Oncology | Admitting: Oncology

## 2013-08-09 VITALS — BP 116/77 | HR 96 | Temp 98.2°F | Resp 20 | Wt 113.1 lb

## 2013-08-09 DIAGNOSIS — K7460 Unspecified cirrhosis of liver: Secondary | ICD-10-CM

## 2013-08-09 DIAGNOSIS — D696 Thrombocytopenia, unspecified: Secondary | ICD-10-CM

## 2013-08-09 DIAGNOSIS — B1920 Unspecified viral hepatitis C without hepatic coma: Secondary | ICD-10-CM

## 2013-08-09 DIAGNOSIS — M810 Age-related osteoporosis without current pathological fracture: Secondary | ICD-10-CM

## 2013-08-09 LAB — CBC WITH DIFFERENTIAL/PLATELET
BASOS ABS: 0 10*3/uL (ref 0.0–0.1)
BASOS PCT: 1 % (ref 0–1)
Eosinophils Absolute: 0.1 10*3/uL (ref 0.0–0.7)
Eosinophils Relative: 2 % (ref 0–5)
HCT: 43.2 % (ref 36.0–46.0)
Hemoglobin: 14.2 g/dL (ref 12.0–15.0)
Lymphocytes Relative: 38 % (ref 12–46)
Lymphs Abs: 2.6 10*3/uL (ref 0.7–4.0)
MCH: 33.5 pg (ref 26.0–34.0)
MCHC: 32.9 g/dL (ref 30.0–36.0)
MCV: 101.9 fL — ABNORMAL HIGH (ref 78.0–100.0)
MONOS PCT: 7 % (ref 3–12)
Monocytes Absolute: 0.5 10*3/uL (ref 0.1–1.0)
NEUTROS ABS: 3.6 10*3/uL (ref 1.7–7.7)
Neutrophils Relative %: 52 % (ref 43–77)
Platelets: 125 10*3/uL — ABNORMAL LOW (ref 150–400)
RBC: 4.24 MIL/uL (ref 3.87–5.11)
RDW: 12.9 % (ref 11.5–15.5)
WBC: 6.8 10*3/uL (ref 4.0–10.5)

## 2013-08-09 NOTE — Progress Notes (Signed)
Labs drawn today for cbc/diff,afp

## 2013-08-09 NOTE — Patient Instructions (Signed)
[facility] Discharge Instructions  RECOMMENDATIONS MADE BY THE CONSULTANT AND ANY TEST RESULTS WILL BE SENT TO YOUR REFERRING PHYSICIAN. We will see you in 8 months for labs and follow-up appointment with the doctor.   Thank you for choosing [facility] to provide your oncology and hematology care.  To afford each patient quality time with our providers, please arrive at least 15 minutes before your scheduled appointment time.  With your help, our goal is to use those 15 minutes to complete the necessary work-up to ensure our physicians have the information they need to help with your evaluation and healthcare recommendations.    Effective January 1st, 2014, we ask that you re-schedule your appointment with our physicians should you arrive 10 or more minutes late for your appointment.  We strive to give you quality time with our providers, and arriving late affects you and other patients whose appointments are after yours.    Again, thank you for choosing [facility].  Our hope is that these requests will decrease the amount of time that you wait before being seen by our physicians.       _____________________________________________________________  Should you have questions after your visit to [facility], please contact our office at [phone] between the hours of 8:30 a.m. and 5:00 p.m.  Voicemails left after 4:30 p.m. will not be returned until the following business day.  For prescription refill requests, have your pharmacy contact our office with your prescription refill request.

## 2013-08-10 LAB — AFP TUMOR MARKER: AFP-Tumor Marker: 9.4 ng/mL — ABNORMAL HIGH (ref 0.0–8.0)

## 2013-10-14 ENCOUNTER — Other Ambulatory Visit: Payer: 59 | Admitting: Obstetrics and Gynecology

## 2013-10-18 ENCOUNTER — Other Ambulatory Visit (HOSPITAL_COMMUNITY)
Admission: RE | Admit: 2013-10-18 | Discharge: 2013-10-18 | Disposition: A | Discharge status: Home / Self Care | Payer: 59 | Source: Ambulatory Visit | Attending: Obstetrics and Gynecology | Admitting: Obstetrics and Gynecology

## 2013-10-18 ENCOUNTER — Encounter: Payer: Self-pay | Admitting: Obstetrics and Gynecology

## 2013-10-18 ENCOUNTER — Ambulatory Visit (INDEPENDENT_AMBULATORY_CARE_PROVIDER_SITE_OTHER): Payer: 59 | Admitting: Obstetrics and Gynecology

## 2013-10-18 VITALS — BP 118/60 | Ht 59.0 in | Wt 110.0 lb

## 2013-10-18 DIAGNOSIS — Z1151 Encounter for screening for human papillomavirus (HPV): Secondary | ICD-10-CM | POA: Insufficient documentation

## 2013-10-18 DIAGNOSIS — Z01419 Encounter for gynecological examination (general) (routine) without abnormal findings: Secondary | ICD-10-CM | POA: Insufficient documentation

## 2013-10-18 DIAGNOSIS — Z1212 Encounter for screening for malignant neoplasm of rectum: Secondary | ICD-10-CM

## 2013-10-18 DIAGNOSIS — B1920 Unspecified viral hepatitis C without hepatic coma: Secondary | ICD-10-CM

## 2013-10-18 LAB — HEMOCCULT GUIAC POC 1CARD (OFFICE): FECAL OCCULT BLD: NEGATIVE

## 2013-10-18 MED ORDER — ESTRADIOL 1 MG PO TABS: 1.0000 mg | ORAL_TABLET | Freq: Every day | ORAL | Status: DC

## 2013-10-18 MED ORDER — PROGESTERONE MICRONIZED 100 MG PO CAPS: 100.0000 mg | ORAL_CAPSULE | Freq: Every day | ORAL | Status: DC

## 2013-10-18 NOTE — Progress Notes (Signed)
This chart was scribed by Ludger Nutting, Medical Scribe, for Dr. Mallory Shirk on 6/15/15at 9:11 AM. This chart was reviewed by Dr. Mallory Shirk for accuracy.  Assessment:  Annual Gyn Exam Stable pigmented skin nevus right labia majora,    Plan:  1. pap smear done, next pap due 3 years 2. return annually or prn to manage HT. 3    Annual or semiannual mammogram advised Subjective:  Latoya Bautista is a 43 y.o. female No obstetric history on file. who presents for annual exam. No LMP recorded. Patient is postmenopausal. The patient has no complaints today.   The following portions of the patient's history were reviewed and updated as appropriate: allergies, current medications, past family history, past medical history, past social history, past surgical history and problem list.  Review of Systems Constitutional: negative Gastrointestinal: negative Genitourinary: negative, except occasional vaginal spotting.   She reports taking Prometrium 200 mg for 2 weeks each month and estradiol 1 mg daily.   Objective:  BP 118/60  Ht 4\' 11"  (1.499 m)  Wt 110 lb (49.896 kg)  BMI 22.21 kg/m2   BMI: Body mass index is 22.21 kg/(m^2).  General Appearance: Alert, appropriate appearance for age. No acute distress HEENT: Grossly normal Neck / Thyroid:  Cardiovascular: RRR; normal S1, S2, no murmur Lungs: CTA bilaterally Back: No CVAT Breast Exam: No dimpling, nipple retraction or discharge. No masses or nodes., Normal to inspection, Normal breast tissue bilaterally and No masses or nodes.No dimpling, nipple retraction or discharge. Gastrointestinal: Soft, non-tender, no masses or organomegaly Pelvic Exam:  VULVA: normal appearing vulva with no masses, tenderness or lesions, 1 cm x 1 cm dark skin nevus on right, pt to follow. She states it has been there x years. VAGINA: normal appearing vagina with normal color and discharge, no lesions, small introitus, intact hymen CERVIX: normal appearing cervix  without discharge or lesions,  UTERUS: uterus is small, shape, consistency and nontender, mobile,  ADNEXA: normal adnexa in size, nontender and no masses  Rectovaginal: normal rectal, no masses Lymphatic Exam: Non-palpable nodes in neck, clavicular, axillary, or inguinal regions  Skin: no rash or abnormalities Neurologic: Normal gait and speech, no tremor  Psychiatric: Alert and oriented, appropriate affect.  Urinalysis:Not done  Mallory Shirk. MD Pgr [phone] 9:11 AM

## 2013-10-18 NOTE — Patient Instructions (Signed)
[employer] for computer trouble.

## 2013-10-21 LAB — CYTOLOGY - PAP

## 2014-01-13 ENCOUNTER — Other Ambulatory Visit (HOSPITAL_COMMUNITY): Payer: Self-pay | Admitting: Oncology

## 2014-01-13 DIAGNOSIS — K7460 Unspecified cirrhosis of liver: Secondary | ICD-10-CM

## 2014-01-13 MED ORDER — FOLIC ACID 1 MG PO TABS: 1.0000 mg | ORAL_TABLET | Freq: Every day | ORAL | Status: DC

## 2014-04-10 NOTE — Progress Notes (Signed)
Latoya Cahill, MD  [place] [address]  Thrombocytopenia  Cirrhosis of liver without ascites, unspecified hepatic cirrhosis type  CURRENT THERAPY: Observation  INTERVAL HISTORY: Latoya Bautista 43 y.o. female returns for  regular  visit for followup of hepatitis C with thrombocytopenia. She is doing well no longer on her antiviral therapy. She stopped that as of January 2013. Her hepatologist is at [facility].   I personally reviewed and went over laboratory results with the patient.  The results are noted within this dictation.  She denies any ecchymoses or GI bleeding including blood in stool, black tarry stool, hematuria, epistaxis, gingival bleeding, and hemoptysis.    She is actively looking for a new job as she is currently stressed and overworked at her present job she reports.  "Can I use you as a reference."  I would be glad to help if able.  Hematologically, she is doing well and her labs are very stable.  She has an appt at [facility] next week.  Past Medical History  Diagnosis Date  . Leukemia   . Cancer     tongue cancer  . Anemia   . Osteoporosis   . Seizures   . Thyroid disease   . Hepatitis C   . Shingles   . Hepatitis C 04/11/2011  . Liver cirrhosis 04/11/2011  . Thrombocytopenia 04/11/2011    has Hepatitis C; Liver cirrhosis; Thrombocytopenia; Postmenopausal bleeding DUE TO hormone tx.; Localization-related epilepsy; Partial epilepsy with impairment of consciousness; and Routine gynecological examination on her problem list.     is allergic to asparaginase derivatives; elspar; and soap.  Latoya Bautista does not currently have medications on file.  Past Surgical History  Procedure Laterality Date  . Cholecystectomy    . Liver biopsie    . Tongue cancer      surgical removal of area  . Bone marrow biopsy  05/2011  . Bone marrow transplant    . Tongue biopsy      Denies any headaches, dizziness, double vision, fevers,  chills, night sweats, nausea, vomiting, diarrhea, constipation, chest pain, heart palpitations, shortness of breath, blood in stool, black tarry stool, urinary pain, urinary burning, urinary frequency, hematuria.   PHYSICAL EXAMINATION  ECOG PERFORMANCE STATUS: 0 - Asymptomatic  Filed Vitals:   04/11/14 0900  BP: 118/61  Pulse: 78  Temp: 98.4 F (36.9 C)  Resp: 16    GENERAL:alert, no distress, well nourished, well developed, comfortable, cooperative and smiling SKIN: skin color, texture, turgor are normal, no rashes or significant lesions HEAD: Normocephalic, No masses, lesions, tenderness or abnormalities, dandruff noted EYES: normal, PERRLA, EOMI, Conjunctiva are pink and non-injected EARS: External ears normal OROPHARYNX:lips, buccal mucosa, and tongue normal and mucous membranes are moist  NECK: supple, no adenopathy, thyroid normal size, non-tender, without nodularity, no stridor, non-tender, trachea midline LYMPH:  no palpable lymphadenopathy, no hepatosplenomegaly BREAST:not examined LUNGS: clear to auscultation and percussion HEART: regular rate & rhythm, no murmurs, no gallops, S1 normal and S2 normal ABDOMEN:abdomen soft, non-tender, normal bowel sounds and no masses or organomegaly BACK: Back symmetric, no curvature., No CVA tenderness EXTREMITIES:less then 2 second capillary refill, no joint deformities, effusion, or inflammation, no edema, no skin discoloration, no clubbing, no cyanosis  NEURO: alert & oriented x 3 with fluent speech, no focal motor/sensory deficits, gait normal   LABORATORY DATA: CBC    Component Value Date/Time   WBC 6.0 04/11/2014 0849   RBC 4.14  04/11/2014 0849   RBC 3.78* 06/09/2009 1524   HGB 14.0 04/11/2014 0849   HCT 41.7 04/11/2014 0849   PLT 135* 04/11/2014 0849   MCV 100.7* 04/11/2014 0849   MCH 33.8 04/11/2014 0849   MCHC 33.6 04/11/2014 0849   RDW 12.6 04/11/2014 0849   LYMPHSABS 2.1 04/11/2014 0849   MONOABS 0.3 04/11/2014  0849   EOSABS 0.1 04/11/2014 0849   BASOSABS 0.0 04/11/2014 0849      ASSESSMENT:  1. Thrombocytopenia, secondary to #2 and #3.  2. Hepatitis C with thrombocytopenia. She is doing well no longer on her antiviral therapy. She stopped that as of January 2013.  3. Cirrhosis of Liver  Patient Active Problem List   Diagnosis Date Noted  . Routine gynecological examination 10/18/2013  . Localization-related epilepsy 01/01/2013  . Partial epilepsy with impairment of consciousness 12/15/2012  . Postmenopausal bleeding DUE TO hormone tx. 10/08/2012  . Hepatitis C 04/11/2011  . Liver cirrhosis 04/11/2011  . Thrombocytopenia 04/11/2011     PLAN:  1. I personally reviewed and went over laboratory results with the patient.  The results are noted within this dictation. 2. Next screening mammogram is due in March 2016. 3. Labs in 8 months: CBC diff, AFP 4. Return in 8 months for follow-up   THERAPY PLAN:  From a hematologic standpoint, the patient's laboratory work is stable.  All questions were answered. The patient knows to call the clinic with any problems, questions or concerns. We can certainly see the patient much sooner if necessary.  Patient and plan discussed with Dr. Farrel Gobble and he is in agreement with the aforementioned.   Latoya Bautista 04/11/2014

## 2014-04-11 ENCOUNTER — Encounter (HOSPITAL_COMMUNITY): Payer: 59 | Attending: Oncology

## 2014-04-11 ENCOUNTER — Encounter (HOSPITAL_COMMUNITY): Payer: Self-pay | Admitting: Oncology

## 2014-04-11 ENCOUNTER — Encounter (HOSPITAL_BASED_OUTPATIENT_CLINIC_OR_DEPARTMENT_OTHER): Payer: 59 | Admitting: Oncology

## 2014-04-11 VITALS — BP 118/61 | HR 78 | Temp 98.4°F | Resp 16 | Wt 109.6 lb

## 2014-04-11 DIAGNOSIS — K7460 Unspecified cirrhosis of liver: Secondary | ICD-10-CM | POA: Diagnosis not present

## 2014-04-11 DIAGNOSIS — B1920 Unspecified viral hepatitis C without hepatic coma: Secondary | ICD-10-CM | POA: Diagnosis not present

## 2014-04-11 DIAGNOSIS — D696 Thrombocytopenia, unspecified: Secondary | ICD-10-CM

## 2014-04-11 DIAGNOSIS — D6959 Other secondary thrombocytopenia: Secondary | ICD-10-CM | POA: Insufficient documentation

## 2014-04-11 LAB — CBC WITH DIFFERENTIAL/PLATELET
Basophils Absolute: 0 10*3/uL (ref 0.0–0.1)
Basophils Relative: 0 % (ref 0–1)
EOS PCT: 1 % (ref 0–5)
Eosinophils Absolute: 0.1 10*3/uL (ref 0.0–0.7)
HCT: 41.7 % (ref 36.0–46.0)
HEMOGLOBIN: 14 g/dL (ref 12.0–15.0)
LYMPHS ABS: 2.1 10*3/uL (ref 0.7–4.0)
Lymphocytes Relative: 35 % (ref 12–46)
MCH: 33.8 pg (ref 26.0–34.0)
MCHC: 33.6 g/dL (ref 30.0–36.0)
MCV: 100.7 fL — AB (ref 78.0–100.0)
MONOS PCT: 5 % (ref 3–12)
Monocytes Absolute: 0.3 10*3/uL (ref 0.1–1.0)
Neutro Abs: 3.5 10*3/uL (ref 1.7–7.7)
Neutrophils Relative %: 59 % (ref 43–77)
Platelets: 135 10*3/uL — ABNORMAL LOW (ref 150–400)
RBC: 4.14 MIL/uL (ref 3.87–5.11)
RDW: 12.6 % (ref 11.5–15.5)
WBC: 6 10*3/uL (ref 4.0–10.5)

## 2014-04-11 NOTE — Progress Notes (Signed)
Labs for cbcd, afptm

## 2014-04-11 NOTE — Patient Instructions (Signed)
[facility] [facility] Discharge Instructions  RECOMMENDATIONS MADE BY THE CONSULTANT AND ANY TEST RESULTS WILL BE SENT TO YOUR REFERRING PHYSICIAN.  Continue follow-up with primary care provider as directed.  Continue follow-up at [facility] hepatology clinic as directed Labs in 8 months Return in 8 months for follow-up Please call the [facility] for any questions or complaints.   Merry Christmas and Happy New Year  Thank you for choosing [facility] to provide your oncology and hematology care.  To afford each patient quality time with our providers, please arrive at least 15 minutes before your scheduled appointment time.  With your help, our goal is to use those 15 minutes to complete the necessary work-up to ensure our physicians have the information they need to help with your evaluation and healthcare recommendations.    Effective January 1st, 2014, we ask that you re-schedule your appointment with our physicians should you arrive 10 or more minutes late for your appointment.  We strive to give you quality time with our providers, and arriving late affects you and other patients whose appointments are after yours.    Again, thank you for choosing [facility].  Our hope is that these requests will decrease the amount of time that you wait before being seen by our physicians.       _____________________________________________________________  Should you have questions after your visit to [facility], please contact our office at [phone] between the hours of 8:30 a.m. and 5:00 p.m.  Voicemails left after 4:30 p.m. will not be returned until the following business day.  For prescription refill requests, have your pharmacy contact our office with your prescription refill request.

## 2014-04-12 LAB — AFP TUMOR MARKER: AFP-Tumor Marker: 9.7 ng/mL — ABNORMAL HIGH (ref <6.1)

## 2014-07-28 ENCOUNTER — Emergency Department (HOSPITAL_COMMUNITY)
Admission: EM | Admit: 2014-07-28 | Discharge: 2014-07-28 | Disposition: A | Discharge status: Home / Self Care | Payer: 59 | Attending: Emergency Medicine | Admitting: Emergency Medicine

## 2014-07-28 ENCOUNTER — Encounter (HOSPITAL_COMMUNITY): Payer: Self-pay | Admitting: Emergency Medicine

## 2014-07-28 DIAGNOSIS — Z793 Long term (current) use of hormonal contraceptives: Secondary | ICD-10-CM | POA: Diagnosis not present

## 2014-07-28 DIAGNOSIS — Z9049 Acquired absence of other specified parts of digestive tract: Secondary | ICD-10-CM | POA: Diagnosis not present

## 2014-07-28 DIAGNOSIS — Z9889 Other specified postprocedural states: Secondary | ICD-10-CM | POA: Diagnosis not present

## 2014-07-28 DIAGNOSIS — R002 Palpitations: Secondary | ICD-10-CM | POA: Insufficient documentation

## 2014-07-28 DIAGNOSIS — Z862 Personal history of diseases of the blood and blood-forming organs and certain disorders involving the immune mechanism: Secondary | ICD-10-CM | POA: Diagnosis not present

## 2014-07-28 DIAGNOSIS — K529 Noninfective gastroenteritis and colitis, unspecified: Secondary | ICD-10-CM | POA: Insufficient documentation

## 2014-07-28 DIAGNOSIS — Z79899 Other long term (current) drug therapy: Secondary | ICD-10-CM | POA: Diagnosis not present

## 2014-07-28 DIAGNOSIS — Z85810 Personal history of malignant neoplasm of tongue: Secondary | ICD-10-CM | POA: Diagnosis not present

## 2014-07-28 DIAGNOSIS — Z856 Personal history of leukemia: Secondary | ICD-10-CM | POA: Diagnosis not present

## 2014-07-28 DIAGNOSIS — Z8739 Personal history of other diseases of the musculoskeletal system and connective tissue: Secondary | ICD-10-CM | POA: Diagnosis not present

## 2014-07-28 DIAGNOSIS — E079 Disorder of thyroid, unspecified: Secondary | ICD-10-CM | POA: Insufficient documentation

## 2014-07-28 DIAGNOSIS — R197 Diarrhea, unspecified: Secondary | ICD-10-CM | POA: Diagnosis present

## 2014-07-28 DIAGNOSIS — R000 Tachycardia, unspecified: Secondary | ICD-10-CM | POA: Insufficient documentation

## 2014-07-28 DIAGNOSIS — Z8719 Personal history of other diseases of the digestive system: Secondary | ICD-10-CM | POA: Insufficient documentation

## 2014-07-28 LAB — BASIC METABOLIC PANEL
Anion gap: 7 (ref 5–15)
BUN: 27 mg/dL — AB (ref 6–23)
CHLORIDE: 121 mmol/L — AB (ref 96–112)
CO2: 15 mmol/L — ABNORMAL LOW (ref 19–32)
Calcium: 7.3 mg/dL — ABNORMAL LOW (ref 8.4–10.5)
Creatinine, Ser: 0.93 mg/dL (ref 0.50–1.10)
GFR calc Af Amer: 86 mL/min — ABNORMAL LOW (ref >90)
GFR calc non Af Amer: 74 mL/min — ABNORMAL LOW (ref >90)
GLUCOSE: 97 mg/dL (ref 70–99)
POTASSIUM: 3.1 mmol/L — AB (ref 3.5–5.1)
SODIUM: 143 mmol/L (ref 135–145)

## 2014-07-28 LAB — URINALYSIS, ROUTINE W REFLEX MICROSCOPIC
BILIRUBIN URINE: NEGATIVE
Glucose, UA: NEGATIVE mg/dL
Hgb urine dipstick: NEGATIVE
Ketones, ur: NEGATIVE mg/dL
LEUKOCYTES UA: NEGATIVE
NITRITE: NEGATIVE
Protein, ur: 100 mg/dL — AB
Urobilinogen, UA: 0.2 mg/dL (ref 0.0–1.0)
pH: 5.5 (ref 5.0–8.0)

## 2014-07-28 LAB — CBC WITH DIFFERENTIAL/PLATELET
BASOS PCT: 0 % (ref 0–1)
Basophils Absolute: 0 10*3/uL (ref 0.0–0.1)
EOS ABS: 0 10*3/uL (ref 0.0–0.7)
Eosinophils Relative: 0 % (ref 0–5)
HEMATOCRIT: 49.3 % — AB (ref 36.0–46.0)
Hemoglobin: 17 g/dL — ABNORMAL HIGH (ref 12.0–15.0)
Lymphocytes Relative: 20 % (ref 12–46)
Lymphs Abs: 1.9 10*3/uL (ref 0.7–4.0)
MCH: 34.6 pg — AB (ref 26.0–34.0)
MCHC: 34.5 g/dL (ref 30.0–36.0)
MCV: 100.4 fL — AB (ref 78.0–100.0)
MONO ABS: 1.2 10*3/uL — AB (ref 0.1–1.0)
Monocytes Relative: 12 % (ref 3–12)
Neutro Abs: 6.5 10*3/uL (ref 1.7–7.7)
Neutrophils Relative %: 68 % (ref 43–77)
PLATELETS: 144 10*3/uL — AB (ref 150–400)
RBC: 4.91 MIL/uL (ref 3.87–5.11)
RDW: 13 % (ref 11.5–15.5)
WBC: 9.6 10*3/uL (ref 4.0–10.5)

## 2014-07-28 LAB — COMPREHENSIVE METABOLIC PANEL
ALBUMIN: 4.3 g/dL (ref 3.5–5.2)
ALK PHOS: 56 U/L (ref 39–117)
ALT: 27 U/L (ref 0–35)
AST: 32 U/L (ref 0–37)
Anion gap: 14 (ref 5–15)
BUN: 31 mg/dL — ABNORMAL HIGH (ref 6–23)
CHLORIDE: 113 mmol/L — AB (ref 96–112)
CO2: 14 mmol/L — ABNORMAL LOW (ref 19–32)
Calcium: 9.3 mg/dL (ref 8.4–10.5)
Creatinine, Ser: 1.25 mg/dL — ABNORMAL HIGH (ref 0.50–1.10)
GFR calc non Af Amer: 52 mL/min — ABNORMAL LOW (ref >90)
GFR, EST AFRICAN AMERICAN: 60 mL/min — AB (ref >90)
Glucose, Bld: 133 mg/dL — ABNORMAL HIGH (ref 70–99)
POTASSIUM: 3 mmol/L — AB (ref 3.5–5.1)
SODIUM: 141 mmol/L (ref 135–145)
Total Bilirubin: 0.4 mg/dL (ref 0.3–1.2)
Total Protein: 8.3 g/dL (ref 6.0–8.3)

## 2014-07-28 LAB — URINE MICROSCOPIC-ADD ON

## 2014-07-28 LAB — TSH: TSH: 1.532 u[IU]/mL (ref 0.350–4.500)

## 2014-07-28 MED ORDER — DIPHENOXYLATE-ATROPINE 2.5-0.025 MG PO TABS: 1.0000 | ORAL_TABLET | Freq: Four times a day (QID) | ORAL | Status: DC | PRN

## 2014-07-28 MED ORDER — ONDANSETRON 4 MG PO TBDP: 4.0000 mg | ORAL_TABLET | Freq: Three times a day (TID) | ORAL | Status: DC | PRN

## 2014-07-28 MED ORDER — SODIUM CHLORIDE 0.9 % IV BOLUS (SEPSIS)
2000.0000 mL | Freq: Once | INTRAVENOUS | Status: AC
  Administered 2014-07-28: 2000 mL via INTRAVENOUS

## 2014-07-28 MED ORDER — ONDANSETRON HCL 4 MG/2ML IJ SOLN
4.0000 mg | Freq: Once | INTRAMUSCULAR | Status: AC
  Administered 2014-07-28: 4 mg via INTRAVENOUS

## 2014-07-28 MED ORDER — ONDANSETRON HCL 4 MG/2ML IJ SOLN
4.0000 mg | Freq: Once | INTRAMUSCULAR | Status: DC
  Filled 2014-07-28: qty 2

## 2014-07-28 MED ORDER — SODIUM CHLORIDE 0.9 % IV BOLUS (SEPSIS)
1000.0000 mL | Freq: Once | INTRAVENOUS | Status: AC
  Administered 2014-07-28: 1000 mL via INTRAVENOUS

## 2014-07-28 MED ORDER — DIPHENOXYLATE-ATROPINE 2.5-0.025 MG PO TABS
2.0000 | ORAL_TABLET | Freq: Once | ORAL | Status: AC
  Administered 2014-07-28: 2 via ORAL
  Filled 2014-07-28: qty 2

## 2014-07-28 NOTE — ED Notes (Signed)
Pt alert & oriented x4, stable gait. Patient given discharge instructions, paperwork & prescription(s). Patient  instructed to stop at the registration desk to finish any additional paperwork. Patient verbalized understanding. Pt left department w/ no further questions. 

## 2014-07-28 NOTE — Discharge Instructions (Signed)
Continue clear liquids today. Push fluid intake. Check with your physician as needed Return to the emergency room with any worsening. Zofran for any nausea or vomiting, Lomotil for diarrhea.    Viral Gastroenteritis Viral gastroenteritis is also known as stomach flu. This condition affects the stomach and intestinal tract. It can cause sudden diarrhea and vomiting. The illness typically lasts 3 to 8 days. Most people develop an immune response that eventually gets rid of the virus. While this natural response develops, the virus can make you quite ill. CAUSES  Many different viruses can cause gastroenteritis, such as rotavirus or noroviruses. You can catch one of these viruses by consuming contaminated food or water. You may also catch a virus by sharing utensils or other personal items with an infected person or by touching a contaminated surface. SYMPTOMS  The most common symptoms are diarrhea and vomiting. These problems can cause a severe loss of body fluids (dehydration) and a body salt (electrolyte) imbalance. Other symptoms may include:  Fever.  Headache.  Fatigue.  Abdominal pain. DIAGNOSIS  Your caregiver can usually diagnose viral gastroenteritis based on your symptoms and a physical exam. A stool sample may also be taken to test for the presence of viruses or other infections. TREATMENT  This illness typically goes away on its own. Treatments are aimed at rehydration. The most serious cases of viral gastroenteritis involve vomiting so severely that you are not able to keep fluids down. In these cases, fluids must be given through an intravenous line (IV). HOME CARE INSTRUCTIONS   Drink enough fluids to keep your urine clear or pale yellow. Drink small amounts of fluids frequently and increase the amounts as tolerated.  Ask your caregiver for specific rehydration instructions.  Avoid:  Foods high in sugar.  Alcohol.  Carbonated drinks.  Tobacco.  Juice.  Caffeine  drinks.  Extremely hot or cold fluids.  Fatty, greasy foods.  Too much intake of anything at one time.  Dairy products until 24 to 48 hours after diarrhea stops.  You may consume probiotics. Probiotics are active cultures of beneficial bacteria. They may lessen the amount and number of diarrheal stools in adults. Probiotics can be found in yogurt with active cultures and in supplements.  Wash your hands well to avoid spreading the virus.  Only take over-the-counter or prescription medicines for pain, discomfort, or fever as directed by your caregiver. Do not give aspirin to children. Antidiarrheal medicines are not recommended.  Ask your caregiver if you should continue to take your regular prescribed and over-the-counter medicines.  Keep all follow-up appointments as directed by your caregiver. SEEK IMMEDIATE MEDICAL CARE IF:   You are unable to keep fluids down.  You do not urinate at least once every 6 to 8 hours.  You develop shortness of breath.  You notice blood in your stool or vomit. This may look like coffee grounds.  You have abdominal pain that increases or is concentrated in one small area (localized).  You have persistent vomiting or diarrhea.  You have a fever.  The patient is a child younger than 3 months, and he or she has a fever.  The patient is a child older than 3 months, and he or she has a fever and persistent symptoms.  The patient is a child older than 3 months, and he or she has a fever and symptoms suddenly get worse.  The patient is a baby, and he or she has no tears when crying. MAKE SURE YOU:  Understand these instructions.  Will watch your condition.  Will get help right away if you are not doing well or get worse. Document Released: 04/22/2005 Document Revised: 07/15/2011 Document Reviewed: 02/06/2011 [facility] Patient Information 2015 [place], [place]. This information is not intended to replace advice given to you by your health care  provider. Make sure you discuss any questions you have with your health care provider.

## 2014-07-28 NOTE — ED Notes (Signed)
Pt. Reports vomiting and diarrhea starting Tuesday. Pt. Reports high bp at home tonight

## 2014-07-28 NOTE — ED Provider Notes (Signed)
Patient assumed from Dr. Dina Rich. Care discussed with Dr. Dina Rich. Patient with sudden onset of gastroenteritis symptoms. Hydrated. Given antiemetics. Ultimately given Lomotil. Improvement in her nausea. No additional vomiting. Less diarrhea. Basic metabolic profile shows hyperchloremic metabolic acidosis. Non-anion gap. Elevated creatinine. Given additional 2 L of fluid. Recheck shows creatinine of 0.93. Remains with mild non-gap acidosis. I think this is very likely to her Acetazolamide that she takes. She is appropriate for outpatient treatment. Zofran, Lomotil, continuing fluids. Primary care follow-up. ER with acute changes.  Tanna Furry, MD 07/28/14 1047

## 2014-07-28 NOTE — ED Notes (Signed)
BMP drawn after 2 liters of NS.

## 2014-07-28 NOTE — ED Notes (Signed)
Attempted IV access x1 without success. Ellison Carwin, RN at bedside attempting access.

## 2014-07-28 NOTE — ED Provider Notes (Signed)
CSN: [id number]     Arrival date & time 07/28/14  0503 History   First MD Initiated Contact with Patient 07/28/14 0515     Chief Complaint  Patient presents with  . Hypertension  . Diarrhea     (Consider location/radiation/quality/duration/timing/severity/associated sxs/prior Treatment) HPI  This is a 44 year old female with a history of leukemia, thyroid disease, hepatitis C, thrombocytopenia who presents with nausea, vomiting, diarrhea, and palpitations. Patient reports onset of nausea and vomiting on Tuesday. She reports multiple episodes of nonbilious, nonbloody emesis. She also reports multiple episodes of nonbloody diarrhea. She reports Crampy epigastric abdominal pain that is nonradiating.  Currently she is pain-free. She states that all night she felt like her heart was racing. She noted her blood pressure to be 120s over 90s which is high for her. She denies any recent changes in her thyroid medications.  Past Medical History  Diagnosis Date  . Leukemia   . Cancer     tongue cancer  . Anemia   . Osteoporosis   . Seizures   . Thyroid disease   . Hepatitis C   . Shingles   . Hepatitis C 04/11/2011  . Liver cirrhosis 04/11/2011  . Thrombocytopenia 04/11/2011   Past Surgical History  Procedure Laterality Date  . Cholecystectomy    . Liver biopsie    . Tongue cancer      surgical removal of area  . Bone marrow biopsy  05/2011  . Bone marrow transplant    . Tongue biopsy     Family History  Problem Relation Age of Onset  . Hypertension Father   . Diabetes Maternal Uncle    History  Substance Use Topics  . Smoking status: Never Smoker   . Smokeless tobacco: Never Used  . Alcohol Use: No   OB History    No data available     Review of Systems  Constitutional: Negative for fever.  Respiratory: Negative for cough, chest tightness and shortness of breath.   Cardiovascular: Positive for palpitations. Negative for chest pain.  Gastrointestinal: Positive for nausea,  vomiting, abdominal pain and diarrhea.  Genitourinary: Negative for dysuria.  Neurological: Negative for headaches.  All other systems reviewed and are negative.     Allergies  Asparaginase derivatives; Elspar; and Soap  Home Medications   Prior to Admission medications   Medication Sig Start Date End Date Taking? Authorizing Provider  acetaZOLAMIDE (DIAMOX) 250 MG tablet Takes 1 in the morning, 2 at night. 05/26/13   Philmore Pali, NP  carbamazepine (TEGRETOL XR) 200 MG 12 hr tablet Take 200 mg by mouth 2 (two) times daily.    Historical Provider, MD  estradiol (ESTRACE) 1 MG tablet Take 1 tablet (1 mg total) by mouth daily. 10/18/13   [name], MD  fexofenadine (ALLEGRA) 180 MG tablet Take 180 mg by mouth daily as needed. allergies 09/20/10   Historical Provider, MD  folic acid (FOLVITE) 1 MG tablet Take 1 tablet (1 mg total) by mouth daily. 01/13/14   Baird Cancer, PA-C  levothyroxine (SYNTHROID, LEVOTHROID) 50 MCG tablet Take 50 mcg by mouth. Take 50 mg daily every sat and sun    Historical Provider, MD  levothyroxine (SYNTHROID, LEVOTHROID) 75 MCG tablet Take 75 mcg by mouth daily before breakfast. Mon., Tue., Wed., Thurs., Fri.    Historical Provider, MD  Multiple Vitamins-Minerals (CENTRUM PO) Take 1 tablet by mouth daily.      Historical Provider, MD  pantoprazole (PROTONIX) 40 MG tablet Take 40  mg by mouth daily.    Historical Provider, MD  progesterone (PROMETRIUM) 100 MG capsule Take 1 capsule (100 mg total) by mouth daily. For 15 days / month 10/18/13   [name], MD  sodium bicarbonate 325 MG tablet Take 325 mg by mouth 2 (two) times daily.    Historical Provider, MD  vitamin C (ASCORBIC ACID) 500 MG tablet Take 500 mg by mouth daily.  07/18/10   Historical Provider, MD  zoledronic acid (RECLAST) 5 MG/100ML SOLN Inject 5 mg into the vein once. Once yearly    Historical Provider, MD   BP 146/95 mmHg  Pulse 135  Temp(Src) 97.7 F (36.5 C) (Oral)  Resp 16  Ht 4'  11" (1.499 m)  Wt 105 lb (47.628 kg)  BMI 21.20 kg/m2  SpO2 95% Physical Exam  Constitutional: She is oriented to person, place, and time. She appears well-developed and well-nourished. No distress.  HENT:  Head: Normocephalic and atraumatic.  Mucous membranes dry  Eyes: Pupils are equal, round, and reactive to light.  Cardiovascular: Regular rhythm and normal heart sounds.   Tachycardia  Pulmonary/Chest: Effort normal and breath sounds normal. No respiratory distress. She has no wheezes.  Abdominal: Soft. Bowel sounds are normal. There is no tenderness. There is no rebound and no guarding.  Neurological: She is alert and oriented to person, place, and time.  Skin: Skin is warm and dry.  Psychiatric: She has a normal mood and affect.  Nursing note and vitals reviewed.   ED Course  Procedures (including critical care time) Labs Review Labs Reviewed  CBC WITH DIFFERENTIAL/PLATELET - Abnormal; Notable for the following:    Hemoglobin 17.0 (*)    HCT 49.3 (*)    MCV 100.4 (*)    MCH 34.6 (*)    Platelets 144 (*)    Monocytes Absolute 1.2 (*)    All other components within normal limits  URINALYSIS, ROUTINE W REFLEX MICROSCOPIC - Abnormal; Notable for the following:    Specific Gravity, Urine >1.030 (*)    Protein, ur 100 (*)    All other components within normal limits  COMPREHENSIVE METABOLIC PANEL - Abnormal; Notable for the following:    Potassium 3.0 (*)    Chloride 113 (*)    CO2 14 (*)    Glucose, Bld 133 (*)    BUN 31 (*)    Creatinine, Ser 1.25 (*)    GFR calc non Af Amer 52 (*)    GFR calc Af Amer 60 (*)    All other components within normal limits  BASIC METABOLIC PANEL - Abnormal; Notable for the following:    Potassium 3.1 (*)    Chloride 121 (*)    CO2 15 (*)    BUN 27 (*)    Calcium 7.3 (*)    GFR calc non Af Amer 74 (*)    GFR calc Af Amer 86 (*)    All other components within normal limits  TSH  URINE MICROSCOPIC-ADD ON    Imaging Review No  results found.   EKG Interpretation   Date/Time:  Thursday July 28 2014 05:43:58 EDT Ventricular Rate:  126 PR Interval:  114 QRS Duration: 80 QT Interval:  335 QTC Calculation: 485 R Axis:   63 Text Interpretation:  Sinus tachycardia Consider right atrial enlargement  Borderline repolarization abnormality Baseline wander in lead(s) V6 No  prior for comparison Confirmed by HORTON  MD, COURTNEY ([id number]) on  07/28/2014 5:53:27 AM      MDM  Final diagnoses:  None   Patient presents with nausea, vomiting, diarrhea, and palpitations. Initial heart rate in the 130s. Abdomen is nontender and non-peritoneal. History of hypothyroidism on levothyroxine. Lab work obtained. EKG shows sinus tachycardia. Patient given 1 L of fluids and is responding nicely with heart rate in the 110s on recheck. Lab work is pending. Signed out to Dr. Jeneen Rinks. Suspect gastroenteritis. Patient may need further hydration pending lab work.  Merryl Hacker, MD 07/28/14 [phone]

## 2014-10-12 ENCOUNTER — Other Ambulatory Visit: Payer: Self-pay | Admitting: Obstetrics and Gynecology

## 2014-11-11 ENCOUNTER — Other Ambulatory Visit: Payer: Self-pay | Admitting: Obstetrics and Gynecology

## 2014-12-12 ENCOUNTER — Ambulatory Visit (HOSPITAL_COMMUNITY): Payer: Self-pay | Admitting: Oncology

## 2014-12-12 ENCOUNTER — Other Ambulatory Visit (HOSPITAL_COMMUNITY): Payer: Self-pay

## 2014-12-16 ENCOUNTER — Encounter (HOSPITAL_COMMUNITY): Payer: 59

## 2014-12-16 ENCOUNTER — Encounter (HOSPITAL_COMMUNITY): Payer: 59 | Attending: Oncology | Admitting: Oncology

## 2014-12-16 VITALS — BP 143/84 | HR 96 | Temp 98.3°F | Resp 20

## 2014-12-16 DIAGNOSIS — D696 Thrombocytopenia, unspecified: Secondary | ICD-10-CM

## 2014-12-16 DIAGNOSIS — K7460 Unspecified cirrhosis of liver: Secondary | ICD-10-CM

## 2014-12-16 DIAGNOSIS — B1920 Unspecified viral hepatitis C without hepatic coma: Secondary | ICD-10-CM | POA: Insufficient documentation

## 2014-12-16 LAB — CBC WITH DIFFERENTIAL/PLATELET
Basophils Absolute: 0 10*3/uL (ref 0.0–0.1)
Basophils Relative: 1 % (ref 0–1)
EOS PCT: 1 % (ref 0–5)
Eosinophils Absolute: 0.1 10*3/uL (ref 0.0–0.7)
HEMATOCRIT: 44.3 % (ref 36.0–46.0)
Hemoglobin: 14.4 g/dL (ref 12.0–15.0)
LYMPHS PCT: 48 % — AB (ref 12–46)
Lymphs Abs: 3.9 10*3/uL (ref 0.7–4.0)
MCH: 33.3 pg (ref 26.0–34.0)
MCHC: 32.5 g/dL (ref 30.0–36.0)
MCV: 102.5 fL — ABNORMAL HIGH (ref 78.0–100.0)
MONO ABS: 0.8 10*3/uL (ref 0.1–1.0)
Monocytes Relative: 10 % (ref 3–12)
Neutro Abs: 3.2 10*3/uL (ref 1.7–7.7)
Neutrophils Relative %: 40 % — ABNORMAL LOW (ref 43–77)
PLATELETS: 150 10*3/uL (ref 150–400)
RBC: 4.32 MIL/uL (ref 3.87–5.11)
RDW: 12.7 % (ref 11.5–15.5)
WBC: 7.9 10*3/uL (ref 4.0–10.5)

## 2014-12-16 MED ORDER — FOLIC ACID 1 MG PO TABS: 1.0000 mg | ORAL_TABLET | Freq: Every day | ORAL | Status: DC

## 2014-12-16 NOTE — Progress Notes (Signed)
Latoya Pizza, MD  [address]  [place] [address]  Thrombocytopenia  CURRENT THERAPY: Observation  INTERVAL HISTORY: Latoya Bautista 44 y.o. female returns for  regular  visit for followup of hepatitis C with thrombocytopenia. She is doing well no longer on her antiviral therapy. She stopped that as of January 2013. Her hepatologist is at [facility].   I personally reviewed and went over laboratory results with the patient.  The results are noted within this dictation.  She denies any ecchymoses or GI bleeding including blood in stool, black tarry stool, hematuria, epistaxis, gingival bleeding, and hemoptysis.    She is followed from a GI perspective at [facility] and remains in remission from a Hep C standpoint.  She is not on antiviral medication at this time.  Past Medical History  Diagnosis Date  . Leukemia   . Cancer     tongue cancer  . Anemia   . Osteoporosis   . Seizures   . Thyroid disease   . Hepatitis C   . Shingles   . Hepatitis C 04/11/2011  . Liver cirrhosis 04/11/2011  . Thrombocytopenia 04/11/2011    has Hepatitis C; Liver cirrhosis; Thrombocytopenia; Postmenopausal bleeding DUE TO hormone tx.; Localization-related epilepsy; Partial epilepsy with impairment of consciousness; and Routine gynecological examination on her problem list.     is allergic to asparaginase derivatives; elspar; and soap.  Latoya Bautista does not currently have medications on file.  Past Surgical History  Procedure Laterality Date  . Cholecystectomy    . Liver biopsie    . Tongue cancer      surgical removal of area  . Bone marrow biopsy  05/2011  . Bone marrow transplant    . Tongue biopsy      Denies any headaches, dizziness, double vision, fevers, chills, night sweats, nausea, vomiting, diarrhea, constipation, chest pain, heart palpitations, shortness of breath, blood in stool, black tarry stool, urinary pain, urinary burning, urinary frequency, hematuria.   PHYSICAL  EXAMINATION  ECOG PERFORMANCE STATUS: 0 - Asymptomatic  There were no vitals filed for this visit.  GENERAL:alert, no distress, well nourished, well developed, comfortable, cooperative and smiling SKIN: skin color, texture, turgor are normal, no rashes or significant lesions HEAD: Normocephalic, No masses, lesions, tenderness or abnormalities, dandruff noted EYES: normal, PERRLA, EOMI, Conjunctiva are pink and non-injected EARS: External ears normal OROPHARYNX:lips, buccal mucosa, and tongue normal and mucous membranes are moist  NECK: supple, no adenopathy, thyroid normal size, non-tender, without nodularity, no stridor, non-tender, trachea midline LYMPH:  no palpable lymphadenopathy, no hepatosplenomegaly BREAST:not examined LUNGS: clear to auscultation HEART: regular rate & rhythm, no murmurs, no gallops, S1 normal and S2 normal ABDOMEN:abdomen soft, non-tender, normal bowel sounds and no masses or organomegaly BACK: Back symmetric, no curvature., No CVA tenderness EXTREMITIES:less then 2 second capillary refill, no joint deformities, effusion, or inflammation, no edema, no skin discoloration, no clubbing, no cyanosis  NEURO: alert & oriented x 3 with fluent speech, no focal motor/sensory deficits, gait normal   LABORATORY DATA: CBC    Component Value Date/Time   WBC 9.6 07/28/2014 0605   RBC 4.91 07/28/2014 0605   RBC 3.78* 06/09/2009 1524   HGB 17.0* 07/28/2014 0605   HCT 49.3* 07/28/2014 0605   PLT 144* 07/28/2014 0605   MCV 100.4* 07/28/2014 0605   MCH 34.6* 07/28/2014 0605   MCHC 34.5 07/28/2014 0605   RDW 13.0 07/28/2014 0605   LYMPHSABS 1.9 07/28/2014 0605   MONOABS 1.2*  07/28/2014 0605   EOSABS 0.0 07/28/2014 0605   BASOSABS 0.0 07/28/2014 0605      ASSESSMENT/PLAN:   Thrombocytopenia Thrombocytopenia, secondary to Hepatitis C, in remission and off antiviral medication since January 2013 and cirrhosis of liver.  Labs today: CBC diff, AFP  Labs in 9  months: CBC diff, AFP  Return in 9 months for follow-up.    THERAPY PLAN:  From a hematologic standpoint, the patient's laboratory work is stable.  All questions were answered. The patient knows to call the clinic with any problems, questions or concerns. We can certainly see the patient much sooner if necessary.  Patient and plan discussed with Dr. Ancil Linsey and she is in agreement with the aforementioned.    Latoya Bautista 12/16/2014

## 2014-12-16 NOTE — Assessment & Plan Note (Signed)
Thrombocytopenia, secondary to Hepatitis C, in remission and off antiviral medication since January 2013 and cirrhosis of liver.  Labs today: CBC diff, AFP  Labs in 9 months: CBC diff, AFP  Return in 9 months for follow-up.

## 2014-12-16 NOTE — Patient Instructions (Addendum)
[place] at [facility] Discharge Instructions  RECOMMENDATIONS MADE BY THE CONSULTANT AND ANY TEST RESULTS WILL BE SENT TO YOUR REFERRING PHYSICIAN.  Labs and see Gershon Mussel in 9 months   Thank you for choosing [place] at [facility] to provide your oncology and hematology care.  To afford each patient quality time with our provider, please arrive at least 15 minutes before your scheduled appointment time.    You need to re-schedule your appointment should you arrive 10 or more minutes late.  We strive to give you quality time with our providers, and arriving late affects you and other patients whose appointments are after yours.  Also, if you no show three or more times for appointments you may be dismissed from the clinic at the providers discretion.     Again, thank you for choosing [facility].  Our hope is that these requests will decrease the amount of time that you wait before being seen by our physicians.       _____________________________________________________________  Should you have questions after your visit to [facility], please contact our office at [phone] between the hours of 8:30 a.m. and 4:30 p.m.  Voicemails left after 4:30 p.m. will not be returned until the following business day.  For prescription refill requests, have your pharmacy contact our office.

## 2014-12-16 NOTE — Addendum Note (Signed)
Addended by: Baird Cancer on: 12/16/2014 01:40 PM   Modules accepted: Orders

## 2014-12-17 LAB — AFP TUMOR MARKER: AFP TUMOR MARKER: 13.3 ng/mL — AB (ref 0.0–8.3)

## 2015-03-17 ENCOUNTER — Emergency Department (HOSPITAL_COMMUNITY): Payer: 59

## 2015-03-17 ENCOUNTER — Encounter (HOSPITAL_COMMUNITY): Payer: Self-pay | Admitting: Emergency Medicine

## 2015-03-17 ENCOUNTER — Emergency Department (HOSPITAL_COMMUNITY)
Admission: EM | Admit: 2015-03-17 | Discharge: 2015-03-17 | Disposition: A | Discharge status: Home / Self Care | Payer: 59 | Attending: Emergency Medicine | Admitting: Emergency Medicine

## 2015-03-17 DIAGNOSIS — Z856 Personal history of leukemia: Secondary | ICD-10-CM | POA: Diagnosis not present

## 2015-03-17 DIAGNOSIS — Z793 Long term (current) use of hormonal contraceptives: Secondary | ICD-10-CM | POA: Diagnosis not present

## 2015-03-17 DIAGNOSIS — Z8619 Personal history of other infectious and parasitic diseases: Secondary | ICD-10-CM | POA: Insufficient documentation

## 2015-03-17 DIAGNOSIS — I10 Essential (primary) hypertension: Secondary | ICD-10-CM

## 2015-03-17 DIAGNOSIS — Z79899 Other long term (current) drug therapy: Secondary | ICD-10-CM | POA: Diagnosis not present

## 2015-03-17 DIAGNOSIS — E079 Disorder of thyroid, unspecified: Secondary | ICD-10-CM | POA: Diagnosis not present

## 2015-03-17 DIAGNOSIS — Z8739 Personal history of other diseases of the musculoskeletal system and connective tissue: Secondary | ICD-10-CM | POA: Diagnosis not present

## 2015-03-17 DIAGNOSIS — Z85810 Personal history of malignant neoplasm of tongue: Secondary | ICD-10-CM | POA: Insufficient documentation

## 2015-03-17 DIAGNOSIS — D649 Anemia, unspecified: Secondary | ICD-10-CM | POA: Diagnosis not present

## 2015-03-17 DIAGNOSIS — Z8719 Personal history of other diseases of the digestive system: Secondary | ICD-10-CM | POA: Diagnosis not present

## 2015-03-17 DIAGNOSIS — R202 Paresthesia of skin: Secondary | ICD-10-CM | POA: Diagnosis present

## 2015-03-17 LAB — I-STAT CHEM 8, ED
BUN: 10 mg/dL (ref 6–20)
CALCIUM ION: 1.19 mmol/L (ref 1.12–1.23)
CREATININE: 0.7 mg/dL (ref 0.44–1.00)
Chloride: 107 mmol/L (ref 101–111)
GLUCOSE: 107 mg/dL — AB (ref 65–99)
HCT: 43 % (ref 36.0–46.0)
Hemoglobin: 14.6 g/dL (ref 12.0–15.0)
Potassium: 4.3 mmol/L (ref 3.5–5.1)
Sodium: 141 mmol/L (ref 135–145)
TCO2: 20 mmol/L (ref 0–100)

## 2015-03-17 LAB — I-STAT TROPONIN, ED: Troponin i, poc: 0 ng/mL (ref 0.00–0.08)

## 2015-03-17 NOTE — ED Provider Notes (Signed)
CSN: [id number]     Arrival date & time 03/17/15  1958 History   First MD Initiated Contact with Patient 03/17/15 2012     Chief Complaint  Patient presents with  . Hypertension     (Consider location/radiation/quality/duration/timing/severity/associated sxs/prior Treatment) Patient is a 44 y.o. female presenting with hypertension. The history is provided by the patient.  Hypertension This is a recurrent problem. The current episode started more than 1 week ago. The problem occurs constantly. Pertinent negatives include no chest pain, no headaches and no shortness of breath. Nothing aggravates the symptoms. Nothing relieves the symptoms. She has tried nothing for the symptoms.    Past Medical History  Diagnosis Date  . Leukemia ([place])   . Cancer ([facility])     tongue cancer  . Anemia   . Osteoporosis   . Seizures ([place])   . Thyroid disease   . Hepatitis C   . Shingles   . Hepatitis C 04/11/2011  . Liver cirrhosis ([place]) 04/11/2011  . Thrombocytopenia ([place]) 04/11/2011   Past Surgical History  Procedure Laterality Date  . Cholecystectomy    . Liver biopsie    . Tongue cancer      surgical removal of area  . Bone marrow biopsy  05/2011  . Bone marrow transplant    . Tongue biopsy     Family History  Problem Relation Age of Onset  . Hypertension Father   . Diabetes Maternal Uncle    Social History  Substance Use Topics  . Smoking status: Never Smoker   . Smokeless tobacco: Never Used  . Alcohol Use: No   OB History    No data available     Review of Systems  Constitutional: Negative for fever and chills.  Eyes: Negative for pain.  Respiratory: Negative for choking and shortness of breath.   Cardiovascular: Negative for chest pain.  Neurological: Negative for dizziness, seizures, weakness, numbness and headaches.       Tingling in fingers L>R  All other systems reviewed and are negative.     Allergies  Simvastatin; Asparaginase derivatives; Elspar; and Soap  Home  Medications   Prior to Admission medications   Medication Sig Start Date End Date Taking? Authorizing Provider  acetaZOLAMIDE (DIAMOX) 250 MG tablet Takes 1 in the morning, 2 at night. Patient taking differently: Take 250 mg by mouth 2 (two) times daily.  05/26/13  Yes Philmore Pali, NP  carbamazepine (TEGRETOL XR) 200 MG 12 hr tablet Take 400 mg by mouth 2 (two) times daily.    Yes Historical Provider, MD  estradiol (ESTRACE) 1 MG tablet TAKE 1 TABLET BY MOUTH DAILY. 11/13/14  Yes [name], MD  fexofenadine (ALLEGRA) 180 MG tablet Take 180 mg by mouth daily as needed. allergies 09/20/10  Yes Historical Provider, MD  folic acid (FOLVITE) 1 MG tablet Take 1 tablet (1 mg total) by mouth daily. 12/16/14  Yes Baird Cancer, PA-C  levothyroxine (SYNTHROID, LEVOTHROID) 75 MCG tablet Take 75 mcg by mouth daily before breakfast.    Yes Historical Provider, MD  Multiple Vitamins-Minerals (CENTRUM PO) Take 1 tablet by mouth daily.     Yes Historical Provider, MD  pantoprazole (PROTONIX) 40 MG tablet Take 40 mg by mouth daily.   Yes Historical Provider, MD  progesterone (PROMETRIUM) 100 MG capsule TAKE 1 CAPSULE BY MOUTH DAILY. FOR 15 DAYS PER MONTH 10/13/14  Yes [name], MD  rosuvastatin (CRESTOR) 40 MG tablet Take 40 mg by mouth every evening.  Yes Historical Provider, MD  sodium bicarbonate 325 MG tablet Take 325 mg by mouth 2 (two) times daily.   Yes Historical Provider, MD  vitamin C (ASCORBIC ACID) 500 MG tablet Take 500 mg by mouth daily.  07/18/10  Yes Historical Provider, MD  zoledronic acid (RECLAST) 5 MG/100ML SOLN Inject 5 mg into the vein once. Once yearly   Yes Historical Provider, MD   BP 153/92 mmHg  Pulse 96  Temp(Src) 99 F (37.2 C) (Oral)  Resp 18  Ht $R'5\' 4"'pT$  (1.626 m)  Wt 160 lb (72.576 kg)  BMI 27.45 kg/m2  SpO2 100% Physical Exam  Constitutional: She is oriented to person, place, and time. She appears well-developed and well-nourished.  HENT:  Head: Normocephalic and  atraumatic.  Neck: Normal range of motion.  Cardiovascular: Normal rate and regular rhythm.   Pulmonary/Chest: No stridor. No respiratory distress.  Abdominal: Soft. Bowel sounds are normal. She exhibits no distension.  Musculoskeletal: Normal range of motion. She exhibits no edema or tenderness.  Neurological: She is alert and oriented to person, place, and time. No cranial nerve deficit.  No altered mental status, able to give full seemingly accurate history.  Face is symmetric, EOM's intact, pupils equal and reactive, vision intact, tongue and uvula midline without deviation Upper and Lower extremity motor 5/5, intact pain perception in distal extremities, 2+ reflexes in biceps, patella and achilles tendons. Finger to nose normal.  Skin: Skin is warm and dry.  Nursing note and vitals reviewed.   ED Course  Procedures (including critical care time) Labs Review Labs Reviewed  I-STAT CHEM 8, ED - Abnormal; Notable for the following:    Glucose, Bld 107 (*)    All other components within normal limits  Randolm Idol, ED    Imaging Review Dg Chest 2 View  03/17/2015  CLINICAL DATA:  Evaluate for pulmonary edema.  Hypertension EXAM: CHEST  2 VIEW COMPARISON:  10/22/2003 FINDINGS: Normal heart size and mediastinal contours. No acute infiltrate or edema. No effusion or pneumothorax. No acute osseous findings. IMPRESSION: No active cardiopulmonary disease. Electronically Signed   By: Monte Fantasia M.D.   On: 03/17/2015 21:52   I have personally reviewed and evaluated these images and lab results as part of my medical decision-making.   EKG Interpretation   Date/Time:  Friday March 17 2015 20:26:06 EST Ventricular Rate:  107 PR Interval:  143 QRS Duration: 80 QT Interval:  377 QTC Calculation: 503 R Axis:   70 Text Interpretation:  Sinus tachycardia Consider right atrial enlargement  Borderline prolonged QT interval Confirmed by [facility] MD, Corene Cornea [phone]) on  03/17/2015  8:37:17 PM      MDM   Final diagnoses:  Essential hypertension   Hypertension with some paresthesias, resolved on my evaluation. No e/o end organ damage on exam or labs. Already has BP meds at home to start. Doubt cva, perc negative, no trauma to suggest other causes for tingling in arm.   Symptoms still improved. Multiple BP's slightly elevated but not emergently so. Will take blood pressures or times a day at home and follow-up with her doctor for further blood pressure management. Will return here for any chest pain headache vision changes source of breath or other concerning symptoms.     Merrily Pew, MD 03/17/15 2220

## 2015-03-17 NOTE — ED Notes (Signed)
Pt dx with hypertension yesterday, husband picked up medication today, pt has not started it, pt went to work, pt checked BP at  Work 180/98 and later 210/140, pt states now her left arm feels numb

## 2015-05-12 DIAGNOSIS — K7469 Other cirrhosis of liver: Secondary | ICD-10-CM | POA: Diagnosis not present

## 2015-05-12 DIAGNOSIS — M549 Dorsalgia, unspecified: Secondary | ICD-10-CM | POA: Diagnosis not present

## 2015-05-18 DIAGNOSIS — K229 Disease of esophagus, unspecified: Secondary | ICD-10-CM | POA: Diagnosis not present

## 2015-05-18 DIAGNOSIS — D471 Chronic myeloproliferative disease: Secondary | ICD-10-CM | POA: Diagnosis not present

## 2015-05-18 DIAGNOSIS — E784 Other hyperlipidemia: Secondary | ICD-10-CM | POA: Diagnosis not present

## 2015-05-18 DIAGNOSIS — E559 Vitamin D deficiency, unspecified: Secondary | ICD-10-CM | POA: Diagnosis not present

## 2015-05-22 MED FILL — acetaZOLAMIDE 250 MG TABS: 250 | 45 days supply | Qty: 90 | Fill #3

## 2015-05-24 DIAGNOSIS — E038 Other specified hypothyroidism: Secondary | ICD-10-CM | POA: Diagnosis not present

## 2015-05-24 DIAGNOSIS — M810 Age-related osteoporosis without current pathological fracture: Secondary | ICD-10-CM | POA: Diagnosis not present

## 2015-05-24 DIAGNOSIS — F411 Generalized anxiety disorder: Secondary | ICD-10-CM | POA: Diagnosis not present

## 2015-05-24 MED FILL — BETAMETHASONE DP AUG 0.05%: 0.05 | 30 days supply | Qty: 50 | Fill #0

## 2015-05-24 MED FILL — SYNTHROID 75 MCG TABLET: 75 | 30 days supply | Qty: 30 | Fill #0

## 2015-05-29 MED FILL — ROSUVASTATIN CALCIUM 40 MG: 40 | 30 days supply | Qty: 30 | Fill #0

## 2015-05-29 MED FILL — ESTRADIOL 1 MG TABLET: 1 | 30 days supply | Qty: 30 | Fill #7

## 2015-05-29 MED FILL — OLMESARTAN MEDOXOMIL 20 MG: 20 | 30 days supply | Qty: 30 | Fill #2

## 2015-05-29 MED FILL — CARBAMAZEPINE ER 200 MG TAB: 200 | 30 days supply | Qty: 120 | Fill #5

## 2015-05-29 MED FILL — PROGESTERONE 100 MG CAPSULE: 100 | 30 days supply | Qty: 15 | Fill #7

## 2015-05-29 MED FILL — FOLIC ACID 1 MG TABLET: 1 | 30 days supply | Qty: 30 | Fill #5

## 2015-05-29 MED FILL — PANTOPRAZOLE SOD DR 40 MG T: 40 | 90 days supply | Qty: 90 | Fill #1

## 2015-06-13 DIAGNOSIS — K7469 Other cirrhosis of liver: Secondary | ICD-10-CM | POA: Diagnosis not present

## 2015-06-13 DIAGNOSIS — R932 Abnormal findings on diagnostic imaging of liver and biliary tract: Secondary | ICD-10-CM | POA: Diagnosis not present

## 2015-06-26 MED FILL — acetaZOLAMIDE 250 MG TABS: 250 | 45 days supply | Qty: 90 | Fill #4

## 2015-06-26 MED FILL — FOLIC ACID 1 MG TABLET: 1 | 30 days supply | Qty: 30 | Fill #6

## 2015-06-26 MED FILL — PROGESTERONE 100 MG CAPSULE: 100 | 30 days supply | Qty: 15 | Fill #8

## 2015-06-26 MED FILL — SYNTHROID 75 MCG TABLET: 75 | 30 days supply | Qty: 30 | Fill #1

## 2015-06-26 MED FILL — ROSUVASTATIN CALCIUM 40 MG: 40 | 30 days supply | Qty: 30 | Fill #1

## 2015-06-26 MED FILL — ESTRADIOL 1 MG TABLET: 1 | 30 days supply | Qty: 30 | Fill #8

## 2015-06-26 MED FILL — OLMESARTAN MEDOXOMIL 20 MG: 20 | 30 days supply | Qty: 30 | Fill #3

## 2015-06-26 MED FILL — CARBAMAZEPINE ER 200 MG TAB: 200 | 30 days supply | Qty: 120 | Fill #6

## 2015-07-27 MED FILL — OLMESARTAN MEDOXOMIL 20 MG: 20 | 30 days supply | Qty: 30 | Fill #4

## 2015-07-27 MED FILL — ROSUVASTATIN CALCIUM 40 MG: 40 | 30 days supply | Qty: 30 | Fill #2

## 2015-07-27 MED FILL — FOLIC ACID 1 MG TABLET: 1 | 30 days supply | Qty: 30 | Fill #7

## 2015-07-27 MED FILL — PROGESTERONE 100 MG CAPSULE: 100 | 30 days supply | Qty: 30 | Fill #9

## 2015-07-27 MED FILL — CARBAMAZEPINE ER 200 MG TAB: 200 | 30 days supply | Qty: 120 | Fill #7

## 2015-07-27 MED FILL — ESTRADIOL 1 MG TABLET: 1 | 30 days supply | Qty: 30 | Fill #9

## 2015-07-27 MED FILL — SYNTHROID 75 MCG TABLET: 75 | 30 days supply | Qty: 30 | Fill #2

## 2015-07-27 MED FILL — acetaZOLAMIDE 250 MG TABS: 250 | 30 days supply | Qty: 60 | Fill #5

## 2015-08-15 DIAGNOSIS — M810 Age-related osteoporosis without current pathological fracture: Secondary | ICD-10-CM | POA: Diagnosis not present

## 2015-08-15 DIAGNOSIS — E038 Other specified hypothyroidism: Secondary | ICD-10-CM | POA: Diagnosis not present

## 2015-08-15 DIAGNOSIS — F411 Generalized anxiety disorder: Secondary | ICD-10-CM | POA: Diagnosis not present

## 2015-08-15 DIAGNOSIS — D471 Chronic myeloproliferative disease: Secondary | ICD-10-CM | POA: Diagnosis not present

## 2015-08-15 DIAGNOSIS — K229 Disease of esophagus, unspecified: Secondary | ICD-10-CM | POA: Diagnosis not present

## 2015-08-15 DIAGNOSIS — E559 Vitamin D deficiency, unspecified: Secondary | ICD-10-CM | POA: Diagnosis not present

## 2015-08-16 MED FILL — ZOLEDRONIC ACID 5 MG/100 ML: 5 | 30 days supply | Qty: 100 | Fill #0

## 2015-08-17 DIAGNOSIS — I10 Essential (primary) hypertension: Secondary | ICD-10-CM | POA: Diagnosis not present

## 2015-08-22 DIAGNOSIS — N951 Menopausal and female climacteric states: Secondary | ICD-10-CM | POA: Diagnosis not present

## 2015-08-22 DIAGNOSIS — K739 Chronic hepatitis, unspecified: Secondary | ICD-10-CM | POA: Diagnosis not present

## 2015-08-22 DIAGNOSIS — E784 Other hyperlipidemia: Secondary | ICD-10-CM | POA: Diagnosis not present

## 2015-08-22 DIAGNOSIS — E038 Other specified hypothyroidism: Secondary | ICD-10-CM | POA: Diagnosis not present

## 2015-08-22 DIAGNOSIS — E559 Vitamin D deficiency, unspecified: Secondary | ICD-10-CM | POA: Diagnosis not present

## 2015-08-22 DIAGNOSIS — M810 Age-related osteoporosis without current pathological fracture: Secondary | ICD-10-CM | POA: Diagnosis not present

## 2015-08-22 DIAGNOSIS — D471 Chronic myeloproliferative disease: Secondary | ICD-10-CM | POA: Diagnosis not present

## 2015-08-22 DIAGNOSIS — G40201 Localization-related (focal) (partial) symptomatic epilepsy and epileptic syndromes with complex partial seizures, not intractable, with status epilepticus: Secondary | ICD-10-CM | POA: Diagnosis not present

## 2015-08-22 DIAGNOSIS — F411 Generalized anxiety disorder: Secondary | ICD-10-CM | POA: Diagnosis not present

## 2015-08-22 MED FILL — SYNTHROID 75 MCG TABLET: 75 | 30 days supply | Qty: 30 | Fill #0

## 2015-08-22 MED FILL — OLMESARTAN MEDOXOMIL 20 MG: 20 | 30 days supply | Qty: 30 | Fill #5

## 2015-08-22 MED FILL — ESTRADIOL 1 MG TABLET: 1 | 30 days supply | Qty: 30 | Fill #10

## 2015-08-22 MED FILL — PROGESTERONE 100 MG CAPSULE: 100 | 15 days supply | Qty: 15 | Fill #10

## 2015-08-22 MED FILL — CARBAMAZEPINE ER 200 MG TAB: 200 | 30 days supply | Qty: 120 | Fill #8

## 2015-08-22 MED FILL — acetaZOLAMIDE 250 MG TABS: 250 | 30 days supply | Qty: 60 | Fill #6

## 2015-08-22 MED FILL — FOLIC ACID 1 MG TABLET: 1 | 30 days supply | Qty: 30 | Fill #8

## 2015-08-22 MED FILL — ROSUVASTATIN CALCIUM 40 MG: 40 | 30 days supply | Qty: 30 | Fill #0

## 2015-08-24 MED FILL — PANTOPRAZOLE SOD DR 40 MG T: 40 | 90 days supply | Qty: 90 | Fill #0

## 2015-09-13 DIAGNOSIS — F411 Generalized anxiety disorder: Secondary | ICD-10-CM | POA: Diagnosis not present

## 2015-09-13 DIAGNOSIS — D471 Chronic myeloproliferative disease: Secondary | ICD-10-CM | POA: Diagnosis not present

## 2015-09-13 DIAGNOSIS — E784 Other hyperlipidemia: Secondary | ICD-10-CM | POA: Diagnosis not present

## 2015-09-13 DIAGNOSIS — N951 Menopausal and female climacteric states: Secondary | ICD-10-CM | POA: Diagnosis not present

## 2015-09-13 DIAGNOSIS — E559 Vitamin D deficiency, unspecified: Secondary | ICD-10-CM | POA: Diagnosis not present

## 2015-09-13 DIAGNOSIS — K739 Chronic hepatitis, unspecified: Secondary | ICD-10-CM | POA: Diagnosis not present

## 2015-09-13 DIAGNOSIS — M810 Age-related osteoporosis without current pathological fracture: Secondary | ICD-10-CM | POA: Diagnosis not present

## 2015-09-13 DIAGNOSIS — G40201 Localization-related (focal) (partial) symptomatic epilepsy and epileptic syndromes with complex partial seizures, not intractable, with status epilepticus: Secondary | ICD-10-CM | POA: Diagnosis not present

## 2015-09-15 ENCOUNTER — Encounter (HOSPITAL_COMMUNITY): Payer: 59

## 2015-09-15 ENCOUNTER — Other Ambulatory Visit (HOSPITAL_COMMUNITY): Payer: Self-pay

## 2015-09-15 ENCOUNTER — Encounter (HOSPITAL_COMMUNITY): Payer: Self-pay | Admitting: Oncology

## 2015-09-15 ENCOUNTER — Ambulatory Visit (HOSPITAL_COMMUNITY): Payer: Self-pay | Admitting: Oncology

## 2015-09-15 ENCOUNTER — Encounter (HOSPITAL_COMMUNITY): Payer: 59 | Attending: Oncology | Admitting: Oncology

## 2015-09-15 VITALS — BP 119/70 | HR 96 | Temp 98.0°F | Resp 18 | Ht 59.0 in | Wt 116.7 lb

## 2015-09-15 DIAGNOSIS — D696 Thrombocytopenia, unspecified: Secondary | ICD-10-CM

## 2015-09-15 DIAGNOSIS — D7589 Other specified diseases of blood and blood-forming organs: Secondary | ICD-10-CM | POA: Diagnosis not present

## 2015-09-15 DIAGNOSIS — B1920 Unspecified viral hepatitis C without hepatic coma: Secondary | ICD-10-CM | POA: Diagnosis not present

## 2015-09-15 LAB — CBC WITH DIFFERENTIAL/PLATELET
BASOS ABS: 0 10*3/uL (ref 0.0–0.1)
BASOS PCT: 0 %
EOS ABS: 0.1 10*3/uL (ref 0.0–0.7)
Eosinophils Relative: 1 %
HCT: 37.1 % (ref 36.0–46.0)
Hemoglobin: 12.5 g/dL (ref 12.0–15.0)
Lymphocytes Relative: 25 %
Lymphs Abs: 2.5 10*3/uL (ref 0.7–4.0)
MCH: 33.5 pg (ref 26.0–34.0)
MCHC: 33.7 g/dL (ref 30.0–36.0)
MCV: 99.5 fL (ref 78.0–100.0)
MONOS PCT: 9 %
Monocytes Absolute: 0.9 10*3/uL (ref 0.1–1.0)
NEUTROS PCT: 65 %
Neutro Abs: 6.5 10*3/uL (ref 1.7–7.7)
PLATELETS: 163 10*3/uL (ref 150–400)
RBC: 3.73 MIL/uL — ABNORMAL LOW (ref 3.87–5.11)
RDW: 12.7 % (ref 11.5–15.5)
WBC: 10 10*3/uL (ref 4.0–10.5)

## 2015-09-15 LAB — VITAMIN B12: Vitamin B-12: 1671 pg/mL — ABNORMAL HIGH (ref 180–914)

## 2015-09-15 LAB — FOLATE

## 2015-09-15 NOTE — Assessment & Plan Note (Signed)
Thrombocytopenia secondary to cirrhosis of liver with H/O Hepatitis C, in remission. She is doing well no longer on her antiviral therapy. She completed 44 weeks of telaprevir triple based therapy for the treatment of her hepatitis C treatment. Completion date was in January 2012. Treatment was discontinued 4 weeks early due to worsening anemia. Her HCV RNA 6 months post treatment remained undetectable confirming SVR. Most recent HCV RNA level 11/04/2012 continued to validate SVR (HCV RNA -non-detected).  Her hepatologist is Dr. Lazaro Arms at [facility].  She had been treated previously on two occassions and was a partial responder at best which had been complicated by neutropenia and anemia. She has a history of childhood leukemia (ALL) and underwent two bone marrow transplants (1982 and 1984). She denies any further treatment since last bone marrow transplant.   Labs today: CBC diff  Labs added today: B12 and folate- secondary to macrocytosis.  Labs from [facility] reviewed on 05/12/2015: AFP: 9.92 WBC: 6.5 Hemoglobin: 12.3 Hematocrit: 37.2 MCV: 102.9 Platelet: 192 Urine dipstick shows negative for all components.  Micro exam: 3 WBC's per HPF, less than 1 RBC's per HPF and none seen bacteria.  Documentation from [facility] also reviewed.  She is to follow-up with [facility] as scheduled/planned/directed.  Labs in 9 months: CBC diff  Return in 9 months for follow-up.

## 2015-09-15 NOTE — Progress Notes (Signed)
Latoya Neighbors, MD [place] [address]  Thrombocytopenia [facility])  Macrocytosis - Plan: Vitamin B12, Folate  CURRENT THERAPY: Observation  INTERVAL HISTORY: Latoya Bautista 45 y.o. female returns for followup of thrombocytopenia secondary to cirrhosis of liver with H/O Hepatitis C, in remission. She is doing well no longer on her antiviral therapy. She completed 44 weeks of telaprevir triple based therapy for the treatment of her hepatitis C treatment. Completion date was in January 2012. Treatment was discontinued 4 weeks early due to worsening anemia. Her HCV RNA 6 months post treatment remained undetectable confirming SVR. Most recent HCV RNA level 11/04/2012 continued to validate SVR (HCV RNA -non-detected).  Her hepatologist is Dr. Lazaro Arms at [facility].  She had been treated previously on two occassions and was a partial responder at best which had been complicated by neutropenia and anemia. She has a history of childhood leukemia (ALL) and underwent two bone marrow transplants (1982 and 1984). She denies any further treatment since last bone marrow transplant.   I personally reviewed and went over laboratory results with the patient.  The results are noted within this dictation.  She denies any ecchymoses or GI bleeding including blood in stool, black tarry stool, hematuria, epistaxis, gingival bleeding, and hemoptysis.  Kyla was last seen at [facility] by Lattie Haw, PA-C, GI.  Her A/P follows below: Assessment: 45 y.o.year old Caucasian female with well compensated cirrhosis secondary to chronic hepatitis C. Liver biopsy in 12/2010 demonstrated grade 2, stage 3. She underwent 2 courses of HCV treatment in the past and was a partial responder at best. She was retreated and initiated treatment telaprevir based triple therapy on 07/13/2010. She demonstrated an EVR <12 at week 4 and non detectable since week 6. Most recent SVR level 11/04/2012 - non-detected.  She concluded treatment at week 44/48 due to worsening anemia and an increase in her creatinine. She presents today for continued cirrhosis care. History of ALL in remission. Plan:  1.UA checked at this visit which was negative for UTI 2. Outside labs from 11/14/14: Noted to be stable.  3. [place] surveillance: Abdominal ultrasound 12/12/14 Coarse, heterogeneous hepatic echotexture, related to known history of cirrhosis. No focal masses visualized, however, the coarse echotexture limits evaluation. Repeat Ultrasound in March 2017  4.[place] Screening: EGD 07/01/14 Normal esophagus.Normal stomach.Normal examined duodenum. No specimens collected.  5. Seizures: Currently on Tegretol for Seizure Prophylaxis  6. Hypothyroidism: Managed by her local endocrinologist.  7. Osteopenia: Maintenance with Reclast 8. Elevated cholesterol and triglycerides - advised to incorporate more fruits and vegetables.She is on simivistatin '20mg'$  by Endocrinologist  9. Follow-up in clinic in 6 months, sooner if necessary.    Review of Systems  Constitutional: Negative for fever and weight loss.  HENT: Negative.  Negative for nosebleeds.   Eyes: Negative.   Respiratory: Negative.   Cardiovascular: Negative.   Gastrointestinal: Negative.   Genitourinary: Negative.   Musculoskeletal: Negative.   Skin: Negative.   Neurological: Negative.  Negative for weakness.  Endo/Heme/Allergies: Does not bruise/bleed easily.  Psychiatric/Behavioral: Negative.     Past Medical History  Diagnosis Date  . Leukemia ([place])   . Cancer (HCC)     tongue cancer  . Anemia   . Osteoporosis   . Seizures ([place])   . Thyroid disease   . Hepatitis C   . Shingles   . Hepatitis C 04/11/2011  . Liver cirrhosis ([place]) 04/11/2011  . Thrombocytopenia ([place]) 04/11/2011  Past Surgical History  Procedure Laterality Date  . Cholecystectomy    . Liver biopsie    . Tongue cancer      surgical removal of area  . Bone marrow biopsy  05/2011  . Bone  marrow transplant    . Tongue biopsy      Family History  Problem Relation Age of Onset  . Hypertension Father   . Diabetes Maternal Uncle     Social History   Social History  . Marital Status: Married    Spouse Name: Shanon Brow  . Number of Children: 0  . Years of Education: 12th   Occupational History  .     Social History Main Topics  . Smoking status: Never Smoker   . Smokeless tobacco: Never Used  . Alcohol Use: No  . Drug Use: No  . Sexual Activity: No   Other Topics Concern  . None   Social History Narrative   Patient lives at home with her spouse.   Caffeine Use: 16oz bottle of soda daily     PHYSICAL EXAMINATION  ECOG PERFORMANCE STATUS: 0 - Asymptomatic  Filed Vitals:   09/15/15 0918  BP: 119/70  Pulse: 96  Temp: 98 F (36.7 C)  Resp: 18    GENERAL:alert, no distress, well nourished, well developed, comfortable, cooperative, smiling and unaccompanied  SKIN: skin color, texture, turgor are normal, no rashes or significant lesions HEAD: Normocephalic, No masses, lesions, tenderness or abnormalities EYES: normal, EOMI, Conjunctiva are pink and non-injected EARS: External ears normal OROPHARYNX:lips, buccal mucosa, and tongue normal and mucous membranes are moist  NECK: supple, thyroid normal size, non-tender, without nodularity, trachea midline LYMPH:  no palpable lymphadenopathy BREAST:not examined LUNGS: clear to auscultation and percussion HEART: regular rate & rhythm, no murmurs, no gallops, S1 normal and S2 normal ABDOMEN:abdomen soft, non-tender and normal bowel sounds BACK: Back symmetric, no curvature. EXTREMITIES:less then 2 second capillary refill, no joint deformities, effusion, or inflammation, no skin discoloration, no cyanosis  NEURO: alert & oriented x 3 with fluent speech, no focal motor/sensory deficits, gait normal   LABORATORY DATA: CBC    Component Value Date/Time   WBC 10.0 09/15/2015 0856   RBC 3.73* 09/15/2015 0856   RBC  3.78* 06/09/2009 1524   HGB 12.5 09/15/2015 0856   HCT 37.1 09/15/2015 0856   PLT 163 09/15/2015 0856   MCV 99.5 09/15/2015 0856   MCH 33.5 09/15/2015 0856   MCHC 33.7 09/15/2015 0856   RDW 12.7 09/15/2015 0856   LYMPHSABS 2.5 09/15/2015 0856   MONOABS 0.9 09/15/2015 0856   EOSABS 0.1 09/15/2015 0856   BASOSABS 0.0 09/15/2015 0856      Chemistry      Component Value Date/Time   NA 141 03/17/2015 2112   K 4.3 03/17/2015 2112   CL 107 03/17/2015 2112   CO2 15* 07/28/2014 0934   BUN 10 03/17/2015 2112   CREATININE 0.70 03/17/2015 2112      Component Value Date/Time   CALCIUM 7.3* 07/28/2014 0934   CALCIUM 8.2* 12/19/2009 1013   ALKPHOS 56 07/28/2014 0640   AST 32 07/28/2014 0640   ALT 27 07/28/2014 0640   BILITOT 0.4 07/28/2014 0640     Labs at [facility] on 05/12/2015 demonstrate: AFP: 9.92 WBC: 6.5 Hemoglobin: 12.3 Hematocrit: 37.2 MCV: 102.9 Platelet: 192 Urine dipstick shows negative for all components.  Micro exam: 3 WBC's per HPF, less than 1 RBC's per HPF and none seen bacteria.   PENDING LABS:   RADIOGRAPHIC STUDIES:  No results found.   PATHOLOGY:    ASSESSMENT AND PLAN:  Thrombocytopenia Thrombocytopenia secondary to cirrhosis of liver with H/O Hepatitis C, in remission. She is doing well no longer on her antiviral therapy. She completed 44 weeks of telaprevir triple based therapy for the treatment of her hepatitis C treatment. Completion date was in January 2012. Treatment was discontinued 4 weeks early due to worsening anemia. Her HCV RNA 6 months post treatment remained undetectable confirming SVR. Most recent HCV RNA level 11/04/2012 continued to validate SVR (HCV RNA -non-detected).  Her hepatologist is Dr. Lazaro Arms at [facility].  She had been treated previously on two occassions and was a partial responder at best which had been complicated by neutropenia and anemia. She has a history of childhood leukemia (ALL) and underwent two bone marrow  transplants (1982 and 1984). She denies any further treatment since last bone marrow transplant.   Labs today: CBC diff  Labs added today: B12 and folate- secondary to macrocytosis.  Labs from [facility] reviewed on 05/12/2015: AFP: 9.92 WBC: 6.5 Hemoglobin: 12.3 Hematocrit: 37.2 MCV: 102.9 Platelet: 192 Urine dipstick shows negative for all components.  Micro exam: 3 WBC's per HPF, less than 1 RBC's per HPF and none seen bacteria.  Documentation from [facility] also reviewed.  She is to follow-up with [facility] as scheduled/planned/directed.  Labs in 9 months: CBC diff  Return in 9 months for follow-up.    ORDERS PLACED FOR THIS ENCOUNTER: Orders Placed This Encounter  Procedures  . Vitamin B12  . Folate    MEDICATIONS PRESCRIBED THIS ENCOUNTER: No orders of the defined types were placed in this encounter.    THERAPY PLAN:  From a hematologic standpoint, the patient's laboratory work is stable.  All questions were answered. The patient knows to call the clinic with any problems, questions or concerns. We can certainly see the patient much sooner if necessary.  Patient and plan discussed with Dr. Ancil Linsey and she is in agreement with the aforementioned.   This note is electronically signed by: Doy Mince 09/15/2015 9:41 AM

## 2015-09-15 NOTE — Patient Instructions (Signed)
[place] at [facility]  Discharge Instructions:  Seen and evaluated by Kirby Crigler, PA today.  Return in 9 Months for Labs and Follow-Up Appt.   _______________________________________________________________  Thank you for choosing [place] at [facility] to provide your oncology and hematology care.  To afford each patient quality time with our providers, please arrive at least 15 minutes before your scheduled appointment.  You need to re-schedule your appointment if you arrive 10 or more minutes late.  We strive to give you quality time with our providers, and arriving late affects you and other patients whose appointments are after yours.  Also, if you no show three or more times for appointments you may be dismissed from the clinic.  Again, thank you for choosing [place] at [place] hope is that these requests will allow you access to exceptional care and in a timely manner. _______________________________________________________________  If you have questions after your visit, please contact our office at [phone] between the hours of 8:30 a.m. and 5:00 p.m. Voicemails left after 4:30 p.m. will not be returned until the following business day. _______________________________________________________________  For prescription refill requests, have your pharmacy contact our office. _______________________________________________________________  Recommendations made by the consultant and any test results will be sent to your referring physician. _______________________________________________________________

## 2015-09-29 ENCOUNTER — Other Ambulatory Visit: Payer: Self-pay | Admitting: Obstetrics and Gynecology

## 2015-09-29 MED FILL — ROSUVASTATIN CALCIUM 40 MG: 40 | 30 days supply | Qty: 30 | Fill #1

## 2015-09-29 MED FILL — FOLIC ACID 1 MG TABLET: 1 | 30 days supply | Qty: 30 | Fill #9

## 2015-09-29 MED FILL — CARBAMAZEPINE ER 200 MG TAB: 200 | 30 days supply | Qty: 120 | Fill #9

## 2015-09-29 MED FILL — SYNTHROID 75 MCG TABLET: 75 | 30 days supply | Qty: 30 | Fill #1

## 2015-09-29 MED FILL — ESTRADIOL 1 MG TABLET: 1 | 30 days supply | Qty: 30 | Fill #11

## 2015-09-29 MED FILL — acetaZOLAMIDE 250 MG TABS: 250 | 30 days supply | Qty: 60 | Fill #7

## 2015-10-03 MED FILL — OLMESARTAN MEDOXOMIL 20 MG: 20 | 30 days supply | Qty: 30 | Fill #0

## 2015-10-04 ENCOUNTER — Other Ambulatory Visit: Payer: Self-pay | Admitting: *Deleted

## 2015-10-04 ENCOUNTER — Other Ambulatory Visit: Payer: Self-pay | Admitting: Obstetrics and Gynecology

## 2015-10-04 DIAGNOSIS — Z1231 Encounter for screening mammogram for malignant neoplasm of breast: Secondary | ICD-10-CM

## 2015-10-04 NOTE — Telephone Encounter (Signed)
Has not been seen in 2 yrs. Needs appt. We asked for followup appt earlier.

## 2015-10-04 NOTE — Telephone Encounter (Signed)
Pt aware that she needs an appointment and an appointment has been made for the 12th.

## 2015-10-06 ENCOUNTER — Ambulatory Visit (HOSPITAL_COMMUNITY)
Admission: RE | Admit: 2015-10-06 | Discharge: 2015-10-06 | Disposition: A | Discharge status: Home / Self Care | Payer: 59 | Source: Ambulatory Visit | Attending: Obstetrics and Gynecology | Admitting: Obstetrics and Gynecology

## 2015-10-06 DIAGNOSIS — Z1231 Encounter for screening mammogram for malignant neoplasm of breast: Secondary | ICD-10-CM | POA: Diagnosis not present

## 2015-10-16 ENCOUNTER — Other Ambulatory Visit (HOSPITAL_COMMUNITY)
Admission: RE | Admit: 2015-10-16 | Discharge: 2015-10-16 | Disposition: A | Discharge status: Home / Self Care | Payer: 59 | Source: Ambulatory Visit | Attending: Obstetrics and Gynecology | Admitting: Obstetrics and Gynecology

## 2015-10-16 ENCOUNTER — Ambulatory Visit (INDEPENDENT_AMBULATORY_CARE_PROVIDER_SITE_OTHER): Payer: 59 | Admitting: Obstetrics and Gynecology

## 2015-10-16 VITALS — BP 130/70 | Ht 59.0 in | Wt 113.0 lb

## 2015-10-16 DIAGNOSIS — Z01411 Encounter for gynecological examination (general) (routine) with abnormal findings: Secondary | ICD-10-CM | POA: Diagnosis not present

## 2015-10-16 DIAGNOSIS — Z01419 Encounter for gynecological examination (general) (routine) without abnormal findings: Secondary | ICD-10-CM | POA: Diagnosis not present

## 2015-10-16 DIAGNOSIS — Z1151 Encounter for screening for human papillomavirus (HPV): Secondary | ICD-10-CM | POA: Diagnosis not present

## 2015-10-16 DIAGNOSIS — R87610 Atypical squamous cells of undetermined significance on cytologic smear of cervix (ASC-US): Secondary | ICD-10-CM | POA: Diagnosis not present

## 2015-10-16 MED ORDER — PROGESTERONE MICRONIZED 100 MG PO CAPS: ORAL_CAPSULE | ORAL | Status: DC

## 2015-10-16 MED ORDER — ESTRADIOL 1 MG PO TABS: 1.0000 mg | ORAL_TABLET | Freq: Every day | ORAL | Status: DC

## 2015-10-16 MED FILL — PROGESTERONE 100 MG CAPSULE: 100 | 30 days supply | Qty: 15 | Fill #0

## 2015-10-16 NOTE — Progress Notes (Signed)
Patient ID: Latoya Bautista, female   DOB: May 28, 1970, 45 y.o.   MRN: [id number]   Assessment:  Annual Gyn Exam Stable unchanged 1cm x 1cm pigmented nevus with irregular borders right labia majora, pt declines offer of excisional biopsy, will follow and document annually   Plan:  1. pap smear done, next pap due in 3 years 2. return annually or prn 3    Annual mammogram advised 4   Continue Estrace and Prometrium 5   Return for yearly monitoring of nevus, pt advised to frequently monitor the area at home with mirror/self exam. Subjective:  Latoya Bautista is a 45 y.o. female No obstetric history on file. who presents for annual exam. No LMP recorded. Patient is postmenopausal. Pt has no complaints today. Pt is not sexually active.  The following portions of the patient's history were reviewed and updated as appropriate: allergies, current medications, past family history, past medical history, past social history, past surgical history and problem list. Past Medical History  Diagnosis Date   Leukemia ([place])    Cancer ([place])     tongue cancer   Anemia    Osteoporosis    Seizures ([place])    Thyroid disease    Hepatitis C    Shingles    Hepatitis C 04/11/2011   Liver cirrhosis ([place]) 04/11/2011   Thrombocytopenia ([place]) 04/11/2011    Past Surgical History  Procedure Laterality Date   Cholecystectomy     Liver biopsie     Tongue cancer      surgical removal of area   Bone marrow biopsy  05/2011   Bone marrow transplant     Tongue biopsy       Current outpatient prescriptions:    acetaZOLAMIDE (DIAMOX) 250 MG tablet, Takes 1 in the morning, 2 at night. (Patient taking differently: Take 250 mg by mouth 2 (two) times daily. ), Disp: 90 tablet, Rfl: 3   carbamazepine (TEGRETOL XR) 200 MG 12 hr tablet, Take 200 mg by mouth 2 (two) times daily. , Disp: , Rfl:    estradiol (ESTRACE) 1 MG tablet, TAKE 1 TABLET BY MOUTH DAILY., Disp: 30 tablet, Rfl: 12   fexofenadine  (ALLEGRA) 180 MG tablet, Take 180 mg by mouth daily as needed. allergies, Disp: , Rfl:    folic acid (FOLVITE) 1 MG tablet, Take 1 tablet (1 mg total) by mouth daily., Disp: 30 tablet, Rfl: 11   Garlic 3419 MG CAPS, Take 1 capsule by mouth daily., Disp: , Rfl:    levothyroxine (SYNTHROID, LEVOTHROID) 75 MCG tablet, Take 75 mcg by mouth daily before breakfast. , Disp: , Rfl:    Multiple Vitamins-Minerals (CENTRUM PO), Take 1 tablet by mouth daily.  , Disp: , Rfl:    olmesartan (BENICAR) 20 MG tablet, Take 20 mg by mouth daily., Disp: , Rfl:    pantoprazole (PROTONIX) 40 MG tablet, Take 40 mg by mouth daily., Disp: , Rfl:    progesterone (PROMETRIUM) 100 MG capsule, TAKE 1 CAPSULE BY MOUTH DAILY. FOR 15 DAYS PER MONTH, Disp: 15 capsule, Rfl: 11   rosuvastatin (CRESTOR) 40 MG tablet, Take 40 mg by mouth every evening. , Disp: , Rfl:    sodium bicarbonate 325 MG tablet, Take 325 mg by mouth 2 (two) times daily., Disp: , Rfl:    vitamin B-12 (CYANOCOBALAMIN) 1000 MCG tablet, Take 1,000 mcg by mouth daily., Disp: , Rfl:    vitamin C (ASCORBIC ACID) 500 MG tablet, Take 500 mg by mouth daily. , Disp: ,  Rfl:    zoledronic acid (RECLAST) 5 MG/100ML SOLN, Inject 5 mg into the vein once. Once yearly, Disp: , Rfl:   Review of Systems Constitutional: negative Gastrointestinal: negative Genitourinary: negative  Objective:  BP 130/70 mmHg   Ht '4\' 11"'$  (1.499 m)   Wt 113 lb (51.256 kg)   BMI 22.81 kg/m2   BMI: Body mass index is 22.81 kg/(m^2).  General Appearance: Alert, appropriate appearance for age. No acute distress HEENT: Grossly normal Neck / Thyroid:  Cardiovascular: RRR; normal S1, S2, no murmur Lungs: CTA bilaterally Back: No CVAT Breast Exam: No dimpling, nipple retraction or discharge. No masses or nodes. Good tissue support.  Gastrointestinal: Soft, non-tender, no masses or organomegaly Pelvic Exam:  External genitalia: normal general appearance; 1cm x 1cm pigmented nevus with  irregular borders, unchanged from last visit, present for years Vaginal: normal mucosa without prolapse or lesions, normal without tenderness, induration or masses and normal rugae Cervix: normal appearance Adnexa: normal bimanual exam Uterus: normal single, nontender Rectal: good sphincter tone, no masses and guaiac negative Lymphatic Exam: Non-palpable nodes in neck, clavicular, axillary, or inguinal regions  Skin: no rash or abnormalities Neurologic: Normal gait and speech, no tremor  Psychiatric: Alert and oriented, appropriate affect.  Urinalysis: Not done  Guaiac negative  Mallory Shirk. MD Pgr [phone] 9:19 AM    By signing my name below, I, Rowan Blase, attest that this documentation has been prepared under the direction and in the presence of [name], MD . Electronically Signed: Rowan Blase, Scribe. 10/16/2015. 9:47 AM.  I personally performed the services described in this documentation, which was SCRIBED in my presence. The recorded information has been reviewed and considered accurate. It has been edited as necessary during review. [name], MD

## 2015-10-16 NOTE — Progress Notes (Signed)
Patient ID: Latoya Bautista, female   DOB: Nov 26, 1970, 45 y.o.   MRN: [id number]   Assessment:  Annual Gyn Exam Stable unchanged 1cm x 1cm pigmented nevus with irregular borders right labia majora, pt declines offer of excisional biopsy, will follow and document annually   Plan:  1. pap smear done, next pap due in 3 years 2. return annually or prn 3    Annual mammogram advised 4   Continue Estrace and Prometrium 5   Return for yearly monitoring of nevus, pt advised to frequently monitor the area at home with mirror/self exam. Subjective:  Latoya Bautista is a 45 y.o. female No obstetric history on file. who presents for annual exam. No LMP recorded. Patient is postmenopausal. Pt has no complaints today. Pt is not sexually active.  The following portions of the patient's history were reviewed and updated as appropriate: allergies, current medications, past family history, past medical history, past social history, past surgical history and problem list. Past Medical History  Diagnosis Date  . Leukemia ([place])   . Cancer ([facility])     tongue cancer  . Anemia   . Osteoporosis   . Seizures ([place])   . Thyroid disease   . Hepatitis C   . Shingles   . Hepatitis C 04/11/2011  . Liver cirrhosis ([place]) 04/11/2011  . Thrombocytopenia ([place]) 04/11/2011    Past Surgical History  Procedure Laterality Date  . Cholecystectomy    . Liver biopsie    . Tongue cancer      surgical removal of area  . Bone marrow biopsy  05/2011  . Bone marrow transplant    . Tongue biopsy       Current outpatient prescriptions:  .  acetaZOLAMIDE (DIAMOX) 250 MG tablet, Takes 1 in the morning, 2 at night. (Patient taking differently: Take 250 mg by mouth 2 (two) times daily. ), Disp: 90 tablet, Rfl: 3 .  carbamazepine (TEGRETOL XR) 200 MG 12 hr tablet, Take 200 mg by mouth 2 (two) times daily. , Disp: , Rfl:  .  estradiol (ESTRACE) 1 MG tablet, TAKE 1 TABLET BY MOUTH DAILY., Disp: 30 tablet, Rfl: 12 .  fexofenadine  (ALLEGRA) 180 MG tablet, Take 180 mg by mouth daily as needed. allergies, Disp: , Rfl:  .  folic acid (FOLVITE) 1 MG tablet, Take 1 tablet (1 mg total) by mouth daily., Disp: 30 tablet, Rfl: 11 .  Garlic 3748 MG CAPS, Take 1 capsule by mouth daily., Disp: , Rfl:  .  levothyroxine (SYNTHROID, LEVOTHROID) 75 MCG tablet, Take 75 mcg by mouth daily before breakfast. , Disp: , Rfl:  .  Multiple Vitamins-Minerals (CENTRUM PO), Take 1 tablet by mouth daily.  , Disp: , Rfl:  .  olmesartan (BENICAR) 20 MG tablet, Take 20 mg by mouth daily., Disp: , Rfl:  .  pantoprazole (PROTONIX) 40 MG tablet, Take 40 mg by mouth daily., Disp: , Rfl:  .  progesterone (PROMETRIUM) 100 MG capsule, TAKE 1 CAPSULE BY MOUTH DAILY. FOR 15 DAYS PER MONTH, Disp: 15 capsule, Rfl: 11 .  rosuvastatin (CRESTOR) 40 MG tablet, Take 40 mg by mouth every evening. , Disp: , Rfl:  .  sodium bicarbonate 325 MG tablet, Take 325 mg by mouth 2 (two) times daily., Disp: , Rfl:  .  vitamin B-12 (CYANOCOBALAMIN) 1000 MCG tablet, Take 1,000 mcg by mouth daily., Disp: , Rfl:  .  vitamin C (ASCORBIC ACID) 500 MG tablet, Take 500 mg by mouth daily. , Disp: ,  Rfl:  .  zoledronic acid (RECLAST) 5 MG/100ML SOLN, Inject 5 mg into the vein once. Once yearly, Disp: , Rfl:   Review of Systems Constitutional: negative Gastrointestinal: negative Genitourinary: negative  Objective:  BP 130/70 mmHg  Ht '4\' 11"'$  (1.499 m)  Wt 113 lb (51.256 kg)  BMI 22.81 kg/m2   BMI: Body mass index is 22.81 kg/(m^2).  General Appearance: Alert, appropriate appearance for age. No acute distress HEENT: Grossly normal Neck / Thyroid:  Cardiovascular: RRR; normal S1, S2, no murmur Lungs: CTA bilaterally Back: No CVAT Breast Exam: No dimpling, nipple retraction or discharge. No masses or nodes. Good tissue support.  Gastrointestinal: Soft, non-tender, no masses or organomegaly Pelvic Exam:  External genitalia: normal general appearance; 1cm x 1cm pigmented nevus with  irregular borders, unchanged from last visit, present for years Vaginal: normal mucosa without prolapse or lesions, normal without tenderness, induration or masses and normal rugae Cervix: normal appearance Adnexa: normal bimanual exam Uterus: normal single, nontender Rectal: good sphincter tone, no masses and guaiac negative Lymphatic Exam: Non-palpable nodes in neck, clavicular, axillary, or inguinal regions  Skin: no rash or abnormalities Neurologic: Normal gait and speech, no tremor  Psychiatric: Alert and oriented, appropriate affect.  Urinalysis: Not done  Guaiac negative  Mallory Shirk. MD Pgr [phone] 9:19 AM    By signing my name below, I, Rowan Blase, attest that this documentation has been prepared under the direction and in the presence of [name], MD . Electronically Signed: Rowan Blase, Scribe. 10/16/2015. 9:47 AM.  I personally performed the services described in this documentation, which was SCRIBED in my presence. The recorded information has been reviewed and considered accurate. It has been edited as necessary during review. [name], MD

## 2015-10-18 LAB — CYTOLOGY - PAP

## 2015-10-26 MED FILL — SYNTHROID 75 MCG TABLET: 75 | 30 days supply | Qty: 30 | Fill #2

## 2015-10-26 MED FILL — FOLIC ACID 1 MG TABLET: 1 | 30 days supply | Qty: 30 | Fill #10

## 2015-10-26 MED FILL — ROSUVASTATIN CALCIUM 40 MG: 40 | 30 days supply | Qty: 30 | Fill #2

## 2015-10-26 MED FILL — ESTRADIOL 1 MG TABLET: 1 | 30 days supply | Qty: 30 | Fill #12

## 2015-10-26 MED FILL — OLMESARTAN MEDOXOMIL 20 MG: 20 | 30 days supply | Qty: 30 | Fill #1

## 2015-10-26 MED FILL — CARBAMAZEPINE ER 200 MG TAB: 200 | 30 days supply | Qty: 120 | Fill #10

## 2015-10-27 ENCOUNTER — Ambulatory Visit (INDEPENDENT_AMBULATORY_CARE_PROVIDER_SITE_OTHER): Payer: 59

## 2015-10-27 ENCOUNTER — Encounter: Payer: Self-pay | Admitting: Podiatry

## 2015-10-27 ENCOUNTER — Ambulatory Visit (INDEPENDENT_AMBULATORY_CARE_PROVIDER_SITE_OTHER): Payer: 59 | Admitting: Podiatry

## 2015-10-27 ENCOUNTER — Ambulatory Visit: Payer: 59

## 2015-10-27 VITALS — BP 146/84 | HR 104 | Resp 18

## 2015-10-27 DIAGNOSIS — M2010 Hallux valgus (acquired), unspecified foot: Secondary | ICD-10-CM | POA: Diagnosis not present

## 2015-10-27 DIAGNOSIS — M216X9 Other acquired deformities of unspecified foot: Secondary | ICD-10-CM | POA: Diagnosis not present

## 2015-10-27 DIAGNOSIS — Q828 Other specified congenital malformations of skin: Secondary | ICD-10-CM | POA: Diagnosis not present

## 2015-10-27 DIAGNOSIS — R52 Pain, unspecified: Secondary | ICD-10-CM

## 2015-10-27 MED FILL — acetaZOLAMIDE 250 MG TABS: 250 | 45 days supply | Qty: 90 | Fill #0

## 2015-10-27 NOTE — Progress Notes (Signed)
   Subjective:    Patient ID: Latoya Bautista, female    DOB: 05-Jun-1970, 45 y.o.   MRN: [id number]  HPI  45 year old female presents the office today for concerns of pain to the bottoms of both of her feet which is been ongoing for several years. She was resistant other podiatrist to trim the areas however she stopped going. She states that she gets pain when she mostly walks and stands. She does get sharp pain to her foot at times which starts she states behind her legs and goes down to her feet. She has never seen another doctor for this. No recent injury or trauma. No other complaints at this time.  Review of Systems  All other systems reviewed and are negative.      Objective:   Physical Exam General: AAO x3, NAD  Dermatological: Hyperkeratotic lesions present bilateral submetatarsal 5 and fifth metatarsal base. Upon debridement these are punctate annular hyperkeratotic lesions without any underlying ulceration, drainage or other signs of infection. No other open lesions or pre-ulcerative lesions identified this time.  Vascular: Dorsalis Pedis artery and Posterior Tibial artery pedal pulses are 2/4 bilateral with immedate capillary fill time. Pedal hair growth present. There is no pain with calf compression, swelling, warmth, erythema.   Neruologic: Grossly intact via light touch bilateral. Vibratory intact via tuning fork bilateral. Protective threshold with Semmes Wienstein monofilament intact to all pedal sites bilateral.   Musculoskeletal:HAV is present b/l. Mild tailors bunion. Prominent metatarsal heads plantarly with atrophy of the fat pad. No pain, crepitus, or limitation noted with foot and ankle range of motion bilateral. Muscular strength 5/5 in all groups tested bilateral.  Gait: Unassisted, Nonantalgic.       Assessment & Plan:  45 year old female with symptomatic porokeratosis -Treatment options discussed including all alternatives, risks, and complications -X-rays  were obtained and reviewed with the patient. No evidence of acute fracture. HAV is present. -Etiology of symptoms were discussed -Lesions debrided 4 without complications or bleeding. Offloading pads were dispensed. Also discuss with her shoe gear modifications orthotics to help take pressure off these areas and to help prevent/slow the reoccurrence. -Discussed shoe gear modifications as well as the bunion. -Follow-up as needed. Call if questions or concerns.  Celesta Gentile, DPM

## 2015-11-10 DIAGNOSIS — K3189 Other diseases of stomach and duodenum: Secondary | ICD-10-CM | POA: Diagnosis not present

## 2015-11-10 DIAGNOSIS — D696 Thrombocytopenia, unspecified: Secondary | ICD-10-CM | POA: Diagnosis not present

## 2015-11-10 DIAGNOSIS — G40209 Localization-related (focal) (partial) symptomatic epilepsy and epileptic syndromes with complex partial seizures, not intractable, without status epilepticus: Secondary | ICD-10-CM | POA: Diagnosis not present

## 2015-11-10 DIAGNOSIS — Z9481 Bone marrow transplant status: Secondary | ICD-10-CM | POA: Diagnosis not present

## 2015-11-10 DIAGNOSIS — C9101 Acute lymphoblastic leukemia, in remission: Secondary | ICD-10-CM | POA: Diagnosis not present

## 2015-11-10 DIAGNOSIS — Z8619 Personal history of other infectious and parasitic diseases: Secondary | ICD-10-CM | POA: Diagnosis not present

## 2015-11-10 DIAGNOSIS — E039 Hypothyroidism, unspecified: Secondary | ICD-10-CM | POA: Diagnosis not present

## 2015-11-10 DIAGNOSIS — M8580 Other specified disorders of bone density and structure, unspecified site: Secondary | ICD-10-CM | POA: Diagnosis not present

## 2015-11-10 DIAGNOSIS — K7469 Other cirrhosis of liver: Secondary | ICD-10-CM | POA: Diagnosis not present

## 2015-11-15 DIAGNOSIS — E559 Vitamin D deficiency, unspecified: Secondary | ICD-10-CM | POA: Diagnosis not present

## 2015-11-15 DIAGNOSIS — E038 Other specified hypothyroidism: Secondary | ICD-10-CM | POA: Diagnosis not present

## 2015-11-15 DIAGNOSIS — M810 Age-related osteoporosis without current pathological fracture: Secondary | ICD-10-CM | POA: Diagnosis not present

## 2015-11-15 DIAGNOSIS — E784 Other hyperlipidemia: Secondary | ICD-10-CM | POA: Diagnosis not present

## 2015-11-15 DIAGNOSIS — F411 Generalized anxiety disorder: Secondary | ICD-10-CM | POA: Diagnosis not present

## 2015-11-15 DIAGNOSIS — D471 Chronic myeloproliferative disease: Secondary | ICD-10-CM | POA: Diagnosis not present

## 2015-11-15 DIAGNOSIS — K739 Chronic hepatitis, unspecified: Secondary | ICD-10-CM | POA: Diagnosis not present

## 2015-11-22 DIAGNOSIS — G40201 Localization-related (focal) (partial) symptomatic epilepsy and epileptic syndromes with complex partial seizures, not intractable, with status epilepticus: Secondary | ICD-10-CM | POA: Diagnosis not present

## 2015-11-22 DIAGNOSIS — K739 Chronic hepatitis, unspecified: Secondary | ICD-10-CM | POA: Diagnosis not present

## 2015-11-22 DIAGNOSIS — E559 Vitamin D deficiency, unspecified: Secondary | ICD-10-CM | POA: Diagnosis not present

## 2015-11-22 DIAGNOSIS — E784 Other hyperlipidemia: Secondary | ICD-10-CM | POA: Diagnosis not present

## 2015-11-22 DIAGNOSIS — N951 Menopausal and female climacteric states: Secondary | ICD-10-CM | POA: Diagnosis not present

## 2015-11-22 DIAGNOSIS — F411 Generalized anxiety disorder: Secondary | ICD-10-CM | POA: Diagnosis not present

## 2015-11-22 DIAGNOSIS — M810 Age-related osteoporosis without current pathological fracture: Secondary | ICD-10-CM | POA: Diagnosis not present

## 2015-11-22 DIAGNOSIS — E038 Other specified hypothyroidism: Secondary | ICD-10-CM | POA: Diagnosis not present

## 2015-11-22 DIAGNOSIS — D471 Chronic myeloproliferative disease: Secondary | ICD-10-CM | POA: Diagnosis not present

## 2015-11-22 MED FILL — VIT D2 1.25 MG (50,000 UNIT: 1.25 MG | 84 days supply | Qty: 12 | Fill #0

## 2015-11-22 MED FILL — ROSUVASTATIN CALCIUM 40 MG: 40 | 30 days supply | Qty: 30 | Fill #0

## 2015-11-22 MED FILL — SYNTHROID 75 MCG TABLET: 75 | 30 days supply | Qty: 30 | Fill #0

## 2015-11-27 DIAGNOSIS — Z862 Personal history of diseases of the blood and blood-forming organs and certain disorders involving the immune mechanism: Secondary | ICD-10-CM | POA: Diagnosis not present

## 2015-11-27 DIAGNOSIS — Z79899 Other long term (current) drug therapy: Secondary | ICD-10-CM | POA: Diagnosis not present

## 2015-11-27 DIAGNOSIS — G40209 Localization-related (focal) (partial) symptomatic epilepsy and epileptic syndromes with complex partial seizures, not intractable, without status epilepticus: Secondary | ICD-10-CM | POA: Diagnosis not present

## 2015-11-27 DIAGNOSIS — G40109 Localization-related (focal) (partial) symptomatic epilepsy and epileptic syndromes with simple partial seizures, not intractable, without status epilepticus: Secondary | ICD-10-CM | POA: Diagnosis not present

## 2015-11-27 MED FILL — PROGESTERONE 100 MG CAPSULE: 100 | 30 days supply | Qty: 15 | Fill #1

## 2015-11-27 MED FILL — OLMESARTAN MEDOXOMIL 20 MG: 20 | 30 days supply | Qty: 30 | Fill #2

## 2015-11-27 MED FILL — FOLIC ACID 1 MG TABLET: 1 | 30 days supply | Qty: 30 | Fill #11

## 2015-11-27 MED FILL — PANTOPRAZOLE SOD DR 40 MG T: 40 | 90 days supply | Qty: 90 | Fill #1

## 2015-11-27 MED FILL — acetaZOLAMIDE 250 MG TABS: 250 | 30 days supply | Qty: 60 | Fill #0

## 2015-11-27 MED FILL — CARBAMAZEPINE ER 200 MG TAB: 200 | 30 days supply | Qty: 120 | Fill #11

## 2015-11-27 MED FILL — ESTRADIOL 1 MG TABLET: 1 | 30 days supply | Qty: 30 | Fill #0

## 2015-11-30 DIAGNOSIS — E038 Other specified hypothyroidism: Secondary | ICD-10-CM | POA: Diagnosis not present

## 2015-11-30 DIAGNOSIS — E559 Vitamin D deficiency, unspecified: Secondary | ICD-10-CM | POA: Diagnosis not present

## 2015-11-30 DIAGNOSIS — K739 Chronic hepatitis, unspecified: Secondary | ICD-10-CM | POA: Diagnosis not present

## 2015-11-30 DIAGNOSIS — D471 Chronic myeloproliferative disease: Secondary | ICD-10-CM | POA: Diagnosis not present

## 2015-11-30 DIAGNOSIS — F411 Generalized anxiety disorder: Secondary | ICD-10-CM | POA: Diagnosis not present

## 2015-11-30 DIAGNOSIS — G40201 Localization-related (focal) (partial) symptomatic epilepsy and epileptic syndromes with complex partial seizures, not intractable, with status epilepticus: Secondary | ICD-10-CM | POA: Diagnosis not present

## 2015-11-30 DIAGNOSIS — M810 Age-related osteoporosis without current pathological fracture: Secondary | ICD-10-CM | POA: Diagnosis not present

## 2015-11-30 DIAGNOSIS — E784 Other hyperlipidemia: Secondary | ICD-10-CM | POA: Diagnosis not present

## 2015-11-30 DIAGNOSIS — N951 Menopausal and female climacteric states: Secondary | ICD-10-CM | POA: Diagnosis not present

## 2015-12-01 DIAGNOSIS — E784 Other hyperlipidemia: Secondary | ICD-10-CM | POA: Diagnosis not present

## 2015-12-01 DIAGNOSIS — G40201 Localization-related (focal) (partial) symptomatic epilepsy and epileptic syndromes with complex partial seizures, not intractable, with status epilepticus: Secondary | ICD-10-CM | POA: Diagnosis not present

## 2015-12-01 DIAGNOSIS — F411 Generalized anxiety disorder: Secondary | ICD-10-CM | POA: Diagnosis not present

## 2015-12-01 DIAGNOSIS — M810 Age-related osteoporosis without current pathological fracture: Secondary | ICD-10-CM | POA: Diagnosis not present

## 2015-12-01 DIAGNOSIS — D471 Chronic myeloproliferative disease: Secondary | ICD-10-CM | POA: Diagnosis not present

## 2015-12-01 DIAGNOSIS — N951 Menopausal and female climacteric states: Secondary | ICD-10-CM | POA: Diagnosis not present

## 2015-12-01 DIAGNOSIS — E559 Vitamin D deficiency, unspecified: Secondary | ICD-10-CM | POA: Diagnosis not present

## 2015-12-01 DIAGNOSIS — K739 Chronic hepatitis, unspecified: Secondary | ICD-10-CM | POA: Diagnosis not present

## 2015-12-08 DIAGNOSIS — M810 Age-related osteoporosis without current pathological fracture: Secondary | ICD-10-CM | POA: Diagnosis not present

## 2015-12-08 DIAGNOSIS — E784 Other hyperlipidemia: Secondary | ICD-10-CM | POA: Diagnosis not present

## 2015-12-08 DIAGNOSIS — D471 Chronic myeloproliferative disease: Secondary | ICD-10-CM | POA: Diagnosis not present

## 2015-12-08 DIAGNOSIS — G40201 Localization-related (focal) (partial) symptomatic epilepsy and epileptic syndromes with complex partial seizures, not intractable, with status epilepticus: Secondary | ICD-10-CM | POA: Diagnosis not present

## 2015-12-08 DIAGNOSIS — E038 Other specified hypothyroidism: Secondary | ICD-10-CM | POA: Diagnosis not present

## 2015-12-08 DIAGNOSIS — N951 Menopausal and female climacteric states: Secondary | ICD-10-CM | POA: Diagnosis not present

## 2015-12-08 DIAGNOSIS — F411 Generalized anxiety disorder: Secondary | ICD-10-CM | POA: Diagnosis not present

## 2015-12-08 DIAGNOSIS — K739 Chronic hepatitis, unspecified: Secondary | ICD-10-CM | POA: Diagnosis not present

## 2015-12-08 DIAGNOSIS — E559 Vitamin D deficiency, unspecified: Secondary | ICD-10-CM | POA: Diagnosis not present

## 2015-12-11 DIAGNOSIS — Z862 Personal history of diseases of the blood and blood-forming organs and certain disorders involving the immune mechanism: Secondary | ICD-10-CM | POA: Insufficient documentation

## 2015-12-27 MED FILL — OLMESARTAN MEDOXOMIL 20 MG: 20 | 30 days supply | Qty: 30 | Fill #3

## 2015-12-27 MED FILL — ROSUVASTATIN CALCIUM 40 MG: 40 | 30 days supply | Qty: 30 | Fill #1

## 2015-12-27 MED FILL — PROGESTERONE 100 MG CAPSULE: 100 | 30 days supply | Qty: 15 | Fill #2

## 2015-12-27 MED FILL — CARBAMAZEPINE ER 200 MG TAB: 200 | 30 days supply | Qty: 120 | Fill #0

## 2015-12-27 MED FILL — ESTRADIOL 1 MG TABLET: 1 | 30 days supply | Qty: 30 | Fill #1

## 2015-12-27 MED FILL — acetaZOLAMIDE 250 MG TABS: 250 | 30 days supply | Qty: 60 | Fill #1

## 2015-12-29 ENCOUNTER — Other Ambulatory Visit (HOSPITAL_COMMUNITY): Payer: Self-pay

## 2015-12-29 DIAGNOSIS — D696 Thrombocytopenia, unspecified: Secondary | ICD-10-CM

## 2015-12-29 DIAGNOSIS — K7460 Unspecified cirrhosis of liver: Secondary | ICD-10-CM

## 2015-12-29 MED ORDER — FOLIC ACID 1 MG PO TABS: 1.0000 mg | ORAL_TABLET | Freq: Every day | ORAL | 6 refills | Status: DC

## 2015-12-29 MED FILL — FOLIC ACID 1 MG TABLET: 1 | 30 days supply | Qty: 30 | Fill #0

## 2015-12-29 NOTE — Telephone Encounter (Signed)
Refill for folic acid sent to patients pharmacy per [facility] PA-C.

## 2016-01-23 DIAGNOSIS — Z8619 Personal history of other infectious and parasitic diseases: Secondary | ICD-10-CM | POA: Diagnosis not present

## 2016-01-23 DIAGNOSIS — K7469 Other cirrhosis of liver: Secondary | ICD-10-CM | POA: Diagnosis not present

## 2016-01-23 DIAGNOSIS — N270 Small kidney, unilateral: Secondary | ICD-10-CM | POA: Diagnosis not present

## 2016-01-29 MED FILL — ESTRADIOL 1 MG TABLET: 1 | 30 days supply | Qty: 30 | Fill #2

## 2016-01-29 MED FILL — PROGESTERONE 100 MG CAPSULE: 100 | 30 days supply | Qty: 15 | Fill #3

## 2016-01-29 MED FILL — CARBAMAZEPINE ER 200 MG TAB: 200 | 30 days supply | Qty: 120 | Fill #1

## 2016-01-29 MED FILL — acetaZOLAMIDE 250 MG TABS: 250 | 30 days supply | Qty: 60 | Fill #2

## 2016-01-29 MED FILL — ROSUVASTATIN CALCIUM 40 MG: 40 | 30 days supply | Qty: 30 | Fill #2

## 2016-01-29 MED FILL — SYNTHROID 75 MCG TABLET: 75 | 30 days supply | Qty: 30 | Fill #1

## 2016-01-29 MED FILL — OLMESARTAN MEDOXOMIL 20 MG: 20 | 30 days supply | Qty: 30 | Fill #4

## 2016-01-29 MED FILL — FOLIC ACID 1 MG TABLET: 1 | 30 days supply | Qty: 30 | Fill #1

## 2016-02-16 DIAGNOSIS — Z23 Encounter for immunization: Secondary | ICD-10-CM | POA: Diagnosis not present

## 2016-02-16 DIAGNOSIS — K7460 Unspecified cirrhosis of liver: Secondary | ICD-10-CM | POA: Diagnosis not present

## 2016-02-16 DIAGNOSIS — Z8669 Personal history of other diseases of the nervous system and sense organs: Secondary | ICD-10-CM | POA: Diagnosis not present

## 2016-02-16 DIAGNOSIS — B182 Chronic viral hepatitis C: Secondary | ICD-10-CM | POA: Diagnosis not present

## 2016-02-16 DIAGNOSIS — E039 Hypothyroidism, unspecified: Secondary | ICD-10-CM | POA: Diagnosis not present

## 2016-02-16 DIAGNOSIS — K219 Gastro-esophageal reflux disease without esophagitis: Secondary | ICD-10-CM | POA: Diagnosis not present

## 2016-02-16 DIAGNOSIS — I10 Essential (primary) hypertension: Secondary | ICD-10-CM | POA: Diagnosis not present

## 2016-02-16 DIAGNOSIS — E782 Mixed hyperlipidemia: Secondary | ICD-10-CM | POA: Diagnosis not present

## 2016-02-16 DIAGNOSIS — E559 Vitamin D deficiency, unspecified: Secondary | ICD-10-CM | POA: Diagnosis not present

## 2016-02-22 MED FILL — VIT D2 1.25 MG (50,000 UNIT: 1.25 MG | 84 days supply | Qty: 12 | Fill #1

## 2016-02-27 ENCOUNTER — Other Ambulatory Visit (HOSPITAL_COMMUNITY): Payer: Self-pay | Admitting: Oncology

## 2016-02-27 DIAGNOSIS — D696 Thrombocytopenia, unspecified: Secondary | ICD-10-CM

## 2016-02-27 DIAGNOSIS — K7460 Unspecified cirrhosis of liver: Secondary | ICD-10-CM

## 2016-02-27 MED FILL — OLMESARTAN MEDOXOMIL 20 MG: 20 | 30 days supply | Qty: 30 | Fill #0

## 2016-02-27 MED FILL — CARBAMAZEPINE ER 200 MG TAB: 200 | 30 days supply | Qty: 120 | Fill #2

## 2016-02-27 MED FILL — FOLIC ACID 1 MG TABLET: 1 | 30 days supply | Qty: 30 | Fill #2

## 2016-02-27 MED FILL — acetaZOLAMIDE 250 MG TABS: 250 | 30 days supply | Qty: 60 | Fill #3

## 2016-02-27 MED FILL — SYNTHROID 75 MCG TABLET: 75 | 30 days supply | Qty: 30 | Fill #2

## 2016-02-27 MED FILL — ESTRADIOL 1 MG TABLET: 1 | 30 days supply | Qty: 30 | Fill #3

## 2016-02-27 MED FILL — PROGESTERONE 100 MG CAPSULE: 100 | 30 days supply | Qty: 15 | Fill #4

## 2016-02-27 MED FILL — ROSUVASTATIN CALCIUM 40 MG: 40 | 30 days supply | Qty: 30 | Fill #3

## 2016-02-27 MED FILL — PANTOPRAZOLE SOD DR 40 MG T: 40 | 90 days supply | Qty: 90 | Fill #0

## 2016-03-29 MED FILL — acetaZOLAMIDE 250 MG TABS: 250 | 30 days supply | Qty: 60 | Fill #4

## 2016-03-29 MED FILL — PROGESTERONE 100 MG CAPSULE: 100 | 30 days supply | Qty: 15 | Fill #5

## 2016-03-29 MED FILL — SYNTHROID 75 MCG TABLET: 75 | 30 days supply | Qty: 30 | Fill #3

## 2016-03-29 MED FILL — CARBAMAZEPINE ER 200 MG TAB: 200 | 30 days supply | Qty: 120 | Fill #3

## 2016-03-29 MED FILL — OLMESARTAN MEDOXOMIL 20 MG: 20 | 30 days supply | Qty: 30 | Fill #1

## 2016-03-29 MED FILL — ESTRADIOL 1 MG TABLET: 1 | 30 days supply | Qty: 30 | Fill #4

## 2016-03-29 MED FILL — FOLIC ACID 1 MG TABLET: 1 | 30 days supply | Qty: 30 | Fill #3

## 2016-03-29 MED FILL — ROSUVASTATIN CALCIUM 40 MG: 40 | 30 days supply | Qty: 30 | Fill #4

## 2016-04-26 MED FILL — ESTRADIOL 1 MG TABLET: 1 | 30 days supply | Qty: 30 | Fill #5

## 2016-04-26 MED FILL — PROGESTERONE 100 MG CAPSULE: 100 | 30 days supply | Qty: 15 | Fill #6

## 2016-04-26 MED FILL — CARBAMAZEPINE ER 200 MG TAB: 200 | 30 days supply | Qty: 120 | Fill #4

## 2016-04-26 MED FILL — OLMESARTAN MEDOXOMIL 20 MG: 20 | 30 days supply | Qty: 30 | Fill #2

## 2016-04-26 MED FILL — acetaZOLAMIDE 250 MG TABS: 250 | 30 days supply | Qty: 60 | Fill #5

## 2016-04-26 MED FILL — ROSUVASTATIN CALCIUM 40 MG: 40 | 30 days supply | Qty: 30 | Fill #5

## 2016-04-26 MED FILL — FOLIC ACID 1 MG TABLET: 1 | 30 days supply | Qty: 30 | Fill #4

## 2016-04-26 MED FILL — SYNTHROID 75 MCG TABLET: 75 | 30 days supply | Qty: 30 | Fill #4

## 2016-05-15 DIAGNOSIS — D471 Chronic myeloproliferative disease: Secondary | ICD-10-CM | POA: Diagnosis not present

## 2016-05-15 DIAGNOSIS — E559 Vitamin D deficiency, unspecified: Secondary | ICD-10-CM | POA: Diagnosis not present

## 2016-05-15 DIAGNOSIS — K739 Chronic hepatitis, unspecified: Secondary | ICD-10-CM | POA: Diagnosis not present

## 2016-05-15 DIAGNOSIS — F411 Generalized anxiety disorder: Secondary | ICD-10-CM | POA: Diagnosis not present

## 2016-05-15 DIAGNOSIS — K229 Disease of esophagus, unspecified: Secondary | ICD-10-CM | POA: Diagnosis not present

## 2016-05-15 DIAGNOSIS — E784 Other hyperlipidemia: Secondary | ICD-10-CM | POA: Diagnosis not present

## 2016-05-15 DIAGNOSIS — M810 Age-related osteoporosis without current pathological fracture: Secondary | ICD-10-CM | POA: Diagnosis not present

## 2016-05-15 DIAGNOSIS — G40201 Localization-related (focal) (partial) symptomatic epilepsy and epileptic syndromes with complex partial seizures, not intractable, with status epilepticus: Secondary | ICD-10-CM | POA: Diagnosis not present

## 2016-05-17 DIAGNOSIS — I10 Essential (primary) hypertension: Secondary | ICD-10-CM | POA: Diagnosis not present

## 2016-05-17 DIAGNOSIS — E039 Hypothyroidism, unspecified: Secondary | ICD-10-CM | POA: Diagnosis not present

## 2016-05-17 DIAGNOSIS — K7460 Unspecified cirrhosis of liver: Secondary | ICD-10-CM | POA: Diagnosis not present

## 2016-05-17 DIAGNOSIS — Z85810 Personal history of malignant neoplasm of tongue: Secondary | ICD-10-CM | POA: Diagnosis not present

## 2016-05-17 DIAGNOSIS — M8580 Other specified disorders of bone density and structure, unspecified site: Secondary | ICD-10-CM | POA: Diagnosis not present

## 2016-05-17 DIAGNOSIS — G40109 Localization-related (focal) (partial) symptomatic epilepsy and epileptic syndromes with simple partial seizures, not intractable, without status epilepticus: Secondary | ICD-10-CM | POA: Diagnosis not present

## 2016-05-17 DIAGNOSIS — Z8619 Personal history of other infectious and parasitic diseases: Secondary | ICD-10-CM | POA: Diagnosis not present

## 2016-05-17 DIAGNOSIS — K740 Hepatic fibrosis: Secondary | ICD-10-CM | POA: Diagnosis not present

## 2016-05-17 DIAGNOSIS — Z862 Personal history of diseases of the blood and blood-forming organs and certain disorders involving the immune mechanism: Secondary | ICD-10-CM | POA: Diagnosis not present

## 2016-05-17 DIAGNOSIS — C9101 Acute lymphoblastic leukemia, in remission: Secondary | ICD-10-CM | POA: Diagnosis not present

## 2016-05-17 DIAGNOSIS — E782 Mixed hyperlipidemia: Secondary | ICD-10-CM | POA: Diagnosis not present

## 2016-05-27 MED FILL — PANTOPRAZOLE SOD DR 40 MG T: 40 | 90 days supply | Qty: 90 | Fill #1

## 2016-05-27 MED FILL — acetaZOLAMIDE 250 MG TABS: 250 | 30 days supply | Qty: 60 | Fill #6

## 2016-05-27 MED FILL — PROGESTERONE 100 MG CAPSULE: 100 | 30 days supply | Qty: 15 | Fill #7

## 2016-05-27 MED FILL — ESTRADIOL 1 MG TABLET: 1 | 30 days supply | Qty: 30 | Fill #6

## 2016-05-27 MED FILL — SYNTHROID 75 MCG TABLET: 75 | 30 days supply | Qty: 30 | Fill #5

## 2016-05-27 MED FILL — FOLIC ACID 1 MG TABLET: 1 | 30 days supply | Qty: 30 | Fill #5

## 2016-05-27 MED FILL — CARBAMAZEPINE ER 200 MG TAB: 200 | 30 days supply | Qty: 120 | Fill #5

## 2016-05-27 MED FILL — OLMESARTAN MEDOXOMIL 20 MG: 20 | 30 days supply | Qty: 30 | Fill #3

## 2016-05-28 DIAGNOSIS — G40201 Localization-related (focal) (partial) symptomatic epilepsy and epileptic syndromes with complex partial seizures, not intractable, with status epilepticus: Secondary | ICD-10-CM | POA: Diagnosis not present

## 2016-05-28 DIAGNOSIS — E784 Other hyperlipidemia: Secondary | ICD-10-CM | POA: Diagnosis not present

## 2016-05-28 DIAGNOSIS — N951 Menopausal and female climacteric states: Secondary | ICD-10-CM | POA: Diagnosis not present

## 2016-05-28 DIAGNOSIS — M810 Age-related osteoporosis without current pathological fracture: Secondary | ICD-10-CM | POA: Diagnosis not present

## 2016-05-28 DIAGNOSIS — F411 Generalized anxiety disorder: Secondary | ICD-10-CM | POA: Diagnosis not present

## 2016-05-28 DIAGNOSIS — E038 Other specified hypothyroidism: Secondary | ICD-10-CM | POA: Diagnosis not present

## 2016-05-28 DIAGNOSIS — K739 Chronic hepatitis, unspecified: Secondary | ICD-10-CM | POA: Diagnosis not present

## 2016-05-28 DIAGNOSIS — E559 Vitamin D deficiency, unspecified: Secondary | ICD-10-CM | POA: Diagnosis not present

## 2016-05-28 DIAGNOSIS — D471 Chronic myeloproliferative disease: Secondary | ICD-10-CM | POA: Diagnosis not present

## 2016-05-28 MED FILL — ROSUVASTATIN CALCIUM 40 MG: 40 | 30 days supply | Qty: 30 | Fill #0

## 2016-06-03 DIAGNOSIS — G40209 Localization-related (focal) (partial) symptomatic epilepsy and epileptic syndromes with complex partial seizures, not intractable, without status epilepticus: Secondary | ICD-10-CM | POA: Diagnosis not present

## 2016-06-03 DIAGNOSIS — D696 Thrombocytopenia, unspecified: Secondary | ICD-10-CM | POA: Diagnosis not present

## 2016-06-03 DIAGNOSIS — M8580 Other specified disorders of bone density and structure, unspecified site: Secondary | ICD-10-CM | POA: Diagnosis not present

## 2016-06-04 DIAGNOSIS — G40209 Localization-related (focal) (partial) symptomatic epilepsy and epileptic syndromes with complex partial seizures, not intractable, without status epilepticus: Secondary | ICD-10-CM | POA: Diagnosis not present

## 2016-06-13 MED FILL — VIT D2 1.25 MG (50,000 UNIT: 1.25 MG | 84 days supply | Qty: 12 | Fill #0

## 2016-06-14 ENCOUNTER — Other Ambulatory Visit (HOSPITAL_COMMUNITY): Payer: Self-pay | Admitting: *Deleted

## 2016-06-14 DIAGNOSIS — D696 Thrombocytopenia, unspecified: Secondary | ICD-10-CM

## 2016-06-17 ENCOUNTER — Encounter (HOSPITAL_COMMUNITY): Payer: Self-pay | Admitting: Oncology

## 2016-06-17 ENCOUNTER — Encounter (HOSPITAL_COMMUNITY): Payer: 59 | Attending: Oncology | Admitting: Oncology

## 2016-06-17 ENCOUNTER — Encounter (HOSPITAL_COMMUNITY): Payer: 59

## 2016-06-17 VITALS — BP 136/72 | HR 89 | Temp 98.1°F | Resp 16 | Wt 125.0 lb

## 2016-06-17 DIAGNOSIS — M810 Age-related osteoporosis without current pathological fracture: Secondary | ICD-10-CM | POA: Diagnosis not present

## 2016-06-17 DIAGNOSIS — C9101 Acute lymphoblastic leukemia, in remission: Secondary | ICD-10-CM | POA: Insufficient documentation

## 2016-06-17 DIAGNOSIS — K7460 Unspecified cirrhosis of liver: Secondary | ICD-10-CM | POA: Insufficient documentation

## 2016-06-17 DIAGNOSIS — D696 Thrombocytopenia, unspecified: Secondary | ICD-10-CM

## 2016-06-17 DIAGNOSIS — B182 Chronic viral hepatitis C: Secondary | ICD-10-CM | POA: Insufficient documentation

## 2016-06-17 DIAGNOSIS — E039 Hypothyroidism, unspecified: Secondary | ICD-10-CM | POA: Diagnosis not present

## 2016-06-17 DIAGNOSIS — E7800 Pure hypercholesterolemia, unspecified: Secondary | ICD-10-CM | POA: Insufficient documentation

## 2016-06-17 DIAGNOSIS — Z85810 Personal history of malignant neoplasm of tongue: Secondary | ICD-10-CM | POA: Diagnosis not present

## 2016-06-17 DIAGNOSIS — D6959 Other secondary thrombocytopenia: Secondary | ICD-10-CM | POA: Insufficient documentation

## 2016-06-17 LAB — CBC WITH DIFFERENTIAL/PLATELET
BASOS ABS: 0 10*3/uL (ref 0.0–0.1)
Basophils Relative: 0 %
EOS ABS: 0 10*3/uL (ref 0.0–0.7)
EOS PCT: 0 %
HCT: 33.6 % — ABNORMAL LOW (ref 36.0–46.0)
Hemoglobin: 11.4 g/dL — ABNORMAL LOW (ref 12.0–15.0)
Lymphocytes Relative: 33 %
Lymphs Abs: 2.3 10*3/uL (ref 0.7–4.0)
MCH: 34.1 pg — AB (ref 26.0–34.0)
MCHC: 33.9 g/dL (ref 30.0–36.0)
MCV: 100.6 fL — ABNORMAL HIGH (ref 78.0–100.0)
MONO ABS: 0.5 10*3/uL (ref 0.1–1.0)
Monocytes Relative: 7 %
Neutro Abs: 4.2 10*3/uL (ref 1.7–7.7)
Neutrophils Relative %: 60 %
PLATELETS: 150 10*3/uL (ref 150–400)
RBC: 3.34 MIL/uL — AB (ref 3.87–5.11)
RDW: 12.5 % (ref 11.5–15.5)
WBC: 7.2 10*3/uL (ref 4.0–10.5)

## 2016-06-17 LAB — FOLATE: Folate: >100 ng/mL (ref >5.9)

## 2016-06-17 LAB — VITAMIN B12: VITAMIN B 12: 981 pg/mL — AB (ref 180–914)

## 2016-06-17 NOTE — Progress Notes (Signed)
Latoya Neighbors, MD [place] [id number]  Thrombocytopenia [facility]) - Plan: CBC with Differential  Cirrhosis of liver without ascites, unspecified hepatic cirrhosis type ([place]) - Plan: Vitamin B12, Folate, Iron and TIBC, Ferritin  CURRENT THERAPY: Observation  INTERVAL HISTORY: Latoya Bautista 46 y.o. female returns for followup of thrombocytopenia secondary to cirrhosis of liver with H/O Hepatitis C, in remission. She is doing well no longer on her antiviral therapy. She completed 44 weeks of telaprevir triple based therapy for the treatment of her hepatitis C treatment. Completion date was in January 2012. Treatment was discontinued 4 weeks early due to worsening anemia. Her HCV RNA 6 months post treatment remained undetectable confirming SVR. Most recent HCV RNA level 11/04/2012 continued to validate SVR (HCV RNA -non-detected).  Her hepatologist is Dr. Lazaro Arms at [facility].  She had been treated previously on two occassions and was a partial responder at best which had been complicated by neutropenia and anemia. She has a history of childhood leukemia (ALL) and underwent two bone marrow transplants (1982 and 1984). She denies any further treatment since last bone marrow transplant.   She denies any ecchymoses or GI bleeding including blood in stool, black tarry stool, hematuria, epistaxis, gingival bleeding, and hemoptysis.  Latoya Bautista was last seen at [facility] by Lattie Haw, PA-C, GI on 05/17/2016.  Her A/P follows below: Assessment: 46 y.o.year old Caucasian female with well compensated cirrhosis secondary to chronic hepatitis C. Liver biopsy in 12/2010 demonstrated grade 2, stage 3. She underwent 2 courses of HCV treatment in the past and was a partial responder at best. She was retreated and initiated treatment telaprevir based triple therapy on 07/13/2010. She demonstrated an EVR <12 at week 4 and non detectable since week 6. Most recent SVR level 11/04/2012 -  non-detected. She concluded treatment at week 44/48 due to worsening anemia and an increase in her creatinine. She presents today for continued cirrhosis care. History of ALL in remission. Plan:  1.Labs reviewed Noted to be stable. Fibrosure F2 2. Latoya Bautista surveillance: Abdominal ultrasound 01/23/16 Coarse, heterogeneous liver consistent with hepatic parenchymal disease. No focal hepatic masses were identified 3.Varices Screening: EGD 07/01/14 Normal esophagus.Normal stomach.Normal examined duodenum. No specimens collected.  4. Seizures: Currently on Tegretol for Seizure Prophylaxis  5. Hypothyroidism: Managed by her local endocrinologist.  6. Osteopenia: Maintenance with Reclast 7. Elevated cholesterol and triglycerides - advised to incorporate more fruits and vegetables.She is on simivistatin 60m by Endocrinologist  8. Follow-up in clinic in 6 months, sooner if necessary. Will schedule repeat Ultrasound  And plan to perform Fibroscan at next visit  She is doing well. She denies any hematology complaints. She now works at [employer]  Review of Systems  Constitutional: Negative.  Negative for chills, fever and weight loss.  HENT: Negative.   Eyes: Negative.   Respiratory: Negative.  Negative for cough and shortness of breath.   Cardiovascular: Negative.  Negative for chest pain.  Gastrointestinal: Negative.  Negative for constipation, diarrhea, nausea and vomiting.  Genitourinary: Negative.   Musculoskeletal: Negative.   Skin: Negative.  Negative for rash.  Neurological: Negative.  Negative for weakness.  Endo/Heme/Allergies: Negative.  Does not bruise/bleed easily.  Psychiatric/Behavioral: Negative.     Past Medical History:  Diagnosis Date  . Anemia   . Cancer ([facility])    tongue cancer  . Hepatitis C   . Hepatitis C 04/11/2011  . Leukemia ([place]   . Liver cirrhosis (  [place]) 04/11/2011  . Osteoporosis   . Seizures ([place])   . Shingles   . Thrombocytopenia ([place]) 04/11/2011  .  Thyroid disease     Past Surgical History:  Procedure Laterality Date  . BONE MARROW BIOPSY  05/2011  . BONE MARROW TRANSPLANT    . CHOLECYSTECTOMY    . liver biopsie    . TONGUE BIOPSY    . tongue cancer     surgical removal of area    Family History  Problem Relation Age of Onset  . Hypertension Father   . Diabetes Maternal Uncle     Social History   Social History  . Marital status: Married    Spouse name: Shanon Brow  . Number of children: 0  . Years of education: 12th   Occupational History  .  [employer]   Social History Main Topics  . Smoking status: Never Smoker  . Smokeless tobacco: Never Used  . Alcohol use No  . Drug use: No  . Sexual activity: No   Other Topics Concern  . None   Social History Narrative   Patient lives at home with her spouse.   Caffeine Use: 16oz bottle of soda daily     PHYSICAL EXAMINATION  ECOG PERFORMANCE STATUS: 0 - Asymptomatic  Vitals:   06/17/16 0855  BP: 136/72  Pulse: 89  Resp: 16  Temp: 98.1 F (36.7 C)    GENERAL:alert, no distress, well nourished, well developed, comfortable, cooperative, smiling and unaccompanied  SKIN: skin color, texture, turgor are normal, no rashes or significant lesions HEAD: Normocephalic, No masses, lesions, tenderness or abnormalities EYES: normal, EOMI, Conjunctiva are pink and non-injected EARS: External ears normal OROPHARYNX:lips, buccal mucosa, and tongue normal and mucous membranes are moist  NECK: supple, thyroid normal size, non-tender, without nodularity, trachea midline LYMPH:  no palpable lymphadenopathy BREAST:not examined LUNGS: clear to auscultation and percussion HEART: regular rate & rhythm, no murmurs, no gallops, S1 normal and S2 normal ABDOMEN:abdomen soft, non-tender and normal bowel sounds BACK: Back symmetric, no curvature. EXTREMITIES:less then 2 second capillary refill, no joint deformities, effusion, or inflammation, no skin discoloration, no  cyanosis  NEURO: alert & oriented x 3 with fluent speech, no focal motor/sensory deficits, gait normal   LABORATORY DATA: CBC    Component Value Date/Time   WBC 7.2 06/17/2016 0827   RBC 3.34 (L) 06/17/2016 0827   HGB 11.4 (L) 06/17/2016 0827   HCT 33.6 (L) 06/17/2016 0827   PLT 150 06/17/2016 0827   MCV 100.6 (H) 06/17/2016 0827   MCH 34.1 (H) 06/17/2016 0827   MCHC 33.9 06/17/2016 0827   RDW 12.5 06/17/2016 0827   LYMPHSABS 2.3 06/17/2016 0827   MONOABS 0.5 06/17/2016 0827   EOSABS 0.0 06/17/2016 0827   BASOSABS 0.0 06/17/2016 0827      Chemistry      Component Value Date/Time   NA 141 03/17/2015 2112   K 4.3 03/17/2015 2112   CL 107 03/17/2015 2112   CO2 15 (L) 07/28/2014 0934   BUN 10 03/17/2015 2112   CREATININE 0.70 03/17/2015 2112      Component Value Date/Time   CALCIUM 7.3 (L) 07/28/2014 0934   CALCIUM 8.2 (L) 12/19/2009 1013   ALKPHOS 56 07/28/2014 0640   AST 32 07/28/2014 0640   ALT 27 07/28/2014 0640   BILITOT 0.4 07/28/2014 0640      PENDING LABS:   RADIOGRAPHIC STUDIES:  No results found.   PATHOLOGY:    ASSESSMENT AND PLAN:  Thrombocytopenia Thrombocytopenia secondary to cirrhosis of liver with H/O Hepatitis C, in remission. She is doing well no longer on her antiviral therapy. She completed 44 weeks of telaprevir triple based therapy for the treatment of her hepatitis C treatment. Completion date was in January 2012. Treatment was discontinued 4 weeks early due to worsening anemia. Her HCV RNA 6 months post treatment remained undetectable confirming SVR. Most recent HCV RNA level 11/04/2012 continued to validate SVR (HCV RNA -non-detected).  Her hepatologist is Dr. Lazaro Arms at [facility].  She had been treated previously on two occassions and was a partial responder at best which had been complicated by neutropenia and anemia. She has a history of childhood leukemia (ALL) and underwent two bone marrow transplants (1982 and 1984). She  denies any further treatment since last bone marrow transplant.   Labs today: CBC diff.  I personally reviewed and went over laboratory results with the patient.  The results are noted within this dictation.  Platelet count is WNL.  Minimal anemia is noted.  Documentation from [facility] also reviewed.  She is to follow-up with [facility] in July 2018 for GI.  I have also reviewed neurology follow-up visit documentation.  Labs in 9 months: CBC diff, anemia panel.  Return in 9 months for follow-up.   ORDERS PLACED FOR THIS ENCOUNTER: Orders Placed This Encounter  Procedures  . CBC with Differential  . Vitamin B12  . Folate  . Iron and TIBC  . Ferritin    MEDICATIONS PRESCRIBED THIS ENCOUNTER: Meds ordered this encounter  Medications  . cholecalciferol (VITAMIN D) 1000 units tablet    Sig: Take 1,000 Units by mouth daily.    THERAPY PLAN:  From a hematologic standpoint, the patient's laboratory work is stable.  All questions were answered. The patient knows to call the clinic with any problems, questions or concerns. We can certainly see the patient much sooner if necessary.  Patient and plan discussed with Dr. Twana First and she is in agreement with the aforementioned.   This note is electronically signed by: Doy Mince 06/17/2016 10:13 AM

## 2016-06-17 NOTE — Patient Instructions (Addendum)
[place] at [facility] Discharge Instructions  RECOMMENDATIONS MADE BY THE CONSULTANT AND ANY TEST RESULTS WILL BE SENT TO YOUR REFERRING PHYSICIAN.  Labs today are excellent.  Below is a copy of your labs. We will repeat labs in 9 months. Return in 9 months for follow-up.  CBC    Component Value Date/Time   WBC 7.2 06/17/2016 0827   RBC 3.34 (L) 06/17/2016 0827   HGB 11.4 (L) 06/17/2016 0827   HCT 33.6 (L) 06/17/2016 0827   PLT 150 06/17/2016 0827   MCV 100.6 (H) 06/17/2016 0827   MCH 34.1 (H) 06/17/2016 0827   MCHC 33.9 06/17/2016 0827   RDW 12.5 06/17/2016 0827   LYMPHSABS 2.3 06/17/2016 0827   MONOABS 0.5 06/17/2016 0827   EOSABS 0.0 06/17/2016 0827   BASOSABS 0.0 06/17/2016 0827    Thank you for choosing [place] at [facility] to provide your oncology and hematology care.  To afford each patient quality time with our provider, please arrive at least 15 minutes before your scheduled appointment time.    If you have a lab appointment with the [facility] please come in thru the  Main Entrance and check in at the main information desk  You need to re-schedule your appointment should you arrive 10 or more minutes late.  We strive to give you quality time with our providers, and arriving late affects you and other patients whose appointments are after yours.  Also, if you no show three or more times for appointments you may be dismissed from the clinic at the providers discretion.     Again, thank you for choosing [facility].  Our hope is that these requests will decrease the amount of time that you wait before being seen by our physicians.       _____________________________________________________________  Should you have questions after your visit to [facility], please contact our office at [phone] between the hours of 8:30 a.m. and 4:30 p.m.  Voicemails left after 4:30 p.m. will not be  returned until the following business day.  For prescription refill requests, have your pharmacy contact our office.       Resources For Cancer Patients and their Caregivers ? American Cancer Society: Can assist with transportation, wigs, general needs, runs Look Good Feel Better.        312-208-8820 ? Cancer Care: Provides financial assistance, online support groups, medication/co-pay assistance.  1-800-813-HOPE 7546490852) ? [facility] Assists [place] cancer patients and their families through emotional , educational and financial support.  352-705-8456 ? [place] DSS Where to apply for food stamps, Medicaid and utility assistance. (507) 268-8598 ? RCATS: Transportation to medical appointments. 978-426-3230 ? Social Security Administration: May apply for disability if have a Stage IV cancer. 573-121-7179 204-287-2811 ? [employer], Disability and Transit Services: Assists with nutrition, care and transit needs. [place] Support Programs: @10RELATIVEDAYS @ > Cancer Support Group  2nd Tuesday of the month 1pm-2pm, Journey Room  > Creative Journey  3rd Tuesday of the month 1130am-1pm, Journey Room  > Look Good Feel Better  1st Wednesday of the month 10am-12 noon, Journey Room (Call [place] to register [phone])

## 2016-06-17 NOTE — Assessment & Plan Note (Addendum)
Thrombocytopenia secondary to cirrhosis of liver with H/O Hepatitis C, in remission. She is doing well no longer on her antiviral therapy. She completed 44 weeks of telaprevir triple based therapy for the treatment of her hepatitis C treatment. Completion date was in January 2012. Treatment was discontinued 4 weeks early due to worsening anemia. Her HCV RNA 6 months post treatment remained undetectable confirming SVR. Most recent HCV RNA level 11/04/2012 continued to validate SVR (HCV RNA -non-detected).  Her hepatologist is Dr. Lazaro Arms at [facility].  She had been treated previously on two occassions and was a partial responder at best which had been complicated by neutropenia and anemia. She has a history of childhood leukemia (ALL) and underwent two bone marrow transplants (1982 and 1984). She denies any further treatment since last bone marrow transplant.   Labs today: CBC diff.  I personally reviewed and went over laboratory results with the patient.  The results are noted within this dictation.  Platelet count is WNL.  Minimal anemia is noted.  Documentation from [facility] also reviewed.  She is to follow-up with [facility] in July 2018 for GI.  I have also reviewed neurology follow-up visit documentation.  Labs in 9 months: CBC diff, anemia panel.  Return in 9 months for follow-up.

## 2016-06-28 MED FILL — PROGESTERONE 100 MG CAPSULE: 100 | 30 days supply | Qty: 15 | Fill #8

## 2016-06-28 MED FILL — ESTRADIOL 1 MG TABLET: 1 | 30 days supply | Qty: 30 | Fill #7

## 2016-06-28 MED FILL — acetaZOLAMIDE 250 MG TABS: 250 | 30 days supply | Qty: 60 | Fill #7

## 2016-06-28 MED FILL — ROSUVASTATIN CALCIUM 40 MG: 40 | 90 days supply | Qty: 90 | Fill #1

## 2016-06-28 MED FILL — FOLIC ACID 1 MG TABLET: 1 | 30 days supply | Qty: 30 | Fill #6

## 2016-06-28 MED FILL — OLMESARTAN MEDOXOMIL 20 MG: 20 | 30 days supply | Qty: 30 | Fill #4

## 2016-06-28 MED FILL — SYNTHROID 75 MCG TABLET: 75 | 30 days supply | Qty: 30 | Fill #0

## 2016-06-28 MED FILL — CARBAMAZEPINE ER 200 MG TAB: 200 | 30 days supply | Qty: 120 | Fill #6

## 2016-07-09 DIAGNOSIS — Z8619 Personal history of other infectious and parasitic diseases: Secondary | ICD-10-CM | POA: Diagnosis not present

## 2016-07-09 DIAGNOSIS — Z9049 Acquired absence of other specified parts of digestive tract: Secondary | ICD-10-CM | POA: Diagnosis not present

## 2016-07-09 DIAGNOSIS — K740 Hepatic fibrosis: Secondary | ICD-10-CM | POA: Diagnosis not present

## 2016-07-09 DIAGNOSIS — R932 Abnormal findings on diagnostic imaging of liver and biliary tract: Secondary | ICD-10-CM | POA: Diagnosis not present

## 2016-07-26 MED FILL — CARBAMAZEPINE ER 200 MG TAB: 200 | 30 days supply | Qty: 120 | Fill #7

## 2016-07-26 MED FILL — PROGESTERONE 100 MG CAPSULE: 100 | 30 days supply | Qty: 15 | Fill #9

## 2016-07-26 MED FILL — OLMESARTAN MEDOXOMIL 20 MG: 20 | 30 days supply | Qty: 30 | Fill #5

## 2016-07-26 MED FILL — acetaZOLAMIDE 250 MG TABS: 250 | 30 days supply | Qty: 60 | Fill #8

## 2016-07-26 MED FILL — ESTRADIOL 1 MG TABLET: 1 | 30 days supply | Qty: 30 | Fill #8

## 2016-07-26 MED FILL — SYNTHROID 75 MCG TABLET: 75 | 30 days supply | Qty: 30 | Fill #1

## 2016-07-26 MED FILL — FOLIC ACID 1 MG TABLET: 1 | 30 days supply | Qty: 30 | Fill #0

## 2016-08-16 DIAGNOSIS — M816 Localized osteoporosis [Lequesne]: Secondary | ICD-10-CM | POA: Diagnosis not present

## 2016-08-16 DIAGNOSIS — I10 Essential (primary) hypertension: Secondary | ICD-10-CM | POA: Diagnosis not present

## 2016-08-16 DIAGNOSIS — K219 Gastro-esophageal reflux disease without esophagitis: Secondary | ICD-10-CM | POA: Diagnosis not present

## 2016-08-16 DIAGNOSIS — E559 Vitamin D deficiency, unspecified: Secondary | ICD-10-CM | POA: Diagnosis not present

## 2016-08-16 DIAGNOSIS — E782 Mixed hyperlipidemia: Secondary | ICD-10-CM | POA: Diagnosis not present

## 2016-08-16 DIAGNOSIS — Z6822 Body mass index (BMI) 22.0-22.9, adult: Secondary | ICD-10-CM | POA: Diagnosis not present

## 2016-08-16 MED FILL — DEXILANT DR 60 MG CAPSULE: 60 | 30 days supply | Qty: 30 | Fill #0

## 2016-08-27 MED FILL — OLMESARTAN MEDOXOMIL 20 MG: 20 | 30 days supply | Qty: 30 | Fill #6

## 2016-08-27 MED FILL — VIT D2 1.25 MG (50,000 UNIT: 1.25 MG | 84 days supply | Qty: 12 | Fill #1

## 2016-08-27 MED FILL — CARBAMAZEPINE ER 200 MG TAB: 200 | 30 days supply | Qty: 120 | Fill #8

## 2016-08-27 MED FILL — PROGESTERONE 100 MG CAPSULE: 100 | 30 days supply | Qty: 15 | Fill #10

## 2016-08-27 MED FILL — ESTRADIOL 1 MG TABLET: 1 | 30 days supply | Qty: 30 | Fill #9

## 2016-08-27 MED FILL — SYNTHROID 75 MCG TABLET: 75 | 30 days supply | Qty: 30 | Fill #2

## 2016-08-27 MED FILL — FOLIC ACID 1 MG TABLET: 1 | 30 days supply | Qty: 30 | Fill #1

## 2016-08-27 MED FILL — acetaZOLAMIDE 250 MG TABS: 250 | 30 days supply | Qty: 60 | Fill #9

## 2016-08-27 MED FILL — ZOLEDRONIC ACID 5 MG/100 ML: 5 | 1 days supply | Qty: 100 | Fill #0

## 2016-09-03 DIAGNOSIS — G4089 Other seizures: Secondary | ICD-10-CM | POA: Diagnosis not present

## 2016-09-03 DIAGNOSIS — K228 Other specified diseases of esophagus: Secondary | ICD-10-CM | POA: Diagnosis not present

## 2016-09-03 DIAGNOSIS — E039 Hypothyroidism, unspecified: Secondary | ICD-10-CM | POA: Diagnosis not present

## 2016-09-03 DIAGNOSIS — Z9481 Bone marrow transplant status: Secondary | ICD-10-CM | POA: Diagnosis not present

## 2016-09-03 DIAGNOSIS — K2930 Chronic superficial gastritis without bleeding: Secondary | ICD-10-CM | POA: Diagnosis not present

## 2016-09-03 DIAGNOSIS — B1920 Unspecified viral hepatitis C without hepatic coma: Secondary | ICD-10-CM | POA: Diagnosis not present

## 2016-09-03 DIAGNOSIS — K317 Polyp of stomach and duodenum: Secondary | ICD-10-CM | POA: Diagnosis not present

## 2016-09-03 DIAGNOSIS — I10 Essential (primary) hypertension: Secondary | ICD-10-CM | POA: Diagnosis not present

## 2016-09-03 DIAGNOSIS — K7460 Unspecified cirrhosis of liver: Secondary | ICD-10-CM | POA: Diagnosis not present

## 2016-09-03 HISTORY — PX: ESOPHAGOGASTRODUODENOSCOPY: SHX1529

## 2016-09-04 DIAGNOSIS — E784 Other hyperlipidemia: Secondary | ICD-10-CM | POA: Diagnosis not present

## 2016-09-04 DIAGNOSIS — D471 Chronic myeloproliferative disease: Secondary | ICD-10-CM | POA: Diagnosis not present

## 2016-09-04 DIAGNOSIS — E559 Vitamin D deficiency, unspecified: Secondary | ICD-10-CM | POA: Diagnosis not present

## 2016-09-04 DIAGNOSIS — F411 Generalized anxiety disorder: Secondary | ICD-10-CM | POA: Diagnosis not present

## 2016-09-04 DIAGNOSIS — M810 Age-related osteoporosis without current pathological fracture: Secondary | ICD-10-CM | POA: Diagnosis not present

## 2016-09-04 DIAGNOSIS — G40219 Localization-related (focal) (partial) symptomatic epilepsy and epileptic syndromes with complex partial seizures, intractable, without status epilepticus: Secondary | ICD-10-CM | POA: Diagnosis not present

## 2016-09-04 DIAGNOSIS — K739 Chronic hepatitis, unspecified: Secondary | ICD-10-CM | POA: Diagnosis not present

## 2016-09-11 DIAGNOSIS — N951 Menopausal and female climacteric states: Secondary | ICD-10-CM | POA: Diagnosis not present

## 2016-09-11 DIAGNOSIS — K739 Chronic hepatitis, unspecified: Secondary | ICD-10-CM | POA: Diagnosis not present

## 2016-09-11 DIAGNOSIS — E784 Other hyperlipidemia: Secondary | ICD-10-CM | POA: Diagnosis not present

## 2016-09-11 DIAGNOSIS — M810 Age-related osteoporosis without current pathological fracture: Secondary | ICD-10-CM | POA: Diagnosis not present

## 2016-09-11 DIAGNOSIS — G40201 Localization-related (focal) (partial) symptomatic epilepsy and epileptic syndromes with complex partial seizures, not intractable, with status epilepticus: Secondary | ICD-10-CM | POA: Diagnosis not present

## 2016-09-11 DIAGNOSIS — E038 Other specified hypothyroidism: Secondary | ICD-10-CM | POA: Diagnosis not present

## 2016-09-11 DIAGNOSIS — F411 Generalized anxiety disorder: Secondary | ICD-10-CM | POA: Diagnosis not present

## 2016-09-11 DIAGNOSIS — E559 Vitamin D deficiency, unspecified: Secondary | ICD-10-CM | POA: Diagnosis not present

## 2016-09-11 DIAGNOSIS — D471 Chronic myeloproliferative disease: Secondary | ICD-10-CM | POA: Diagnosis not present

## 2016-09-18 DIAGNOSIS — D471 Chronic myeloproliferative disease: Secondary | ICD-10-CM | POA: Diagnosis not present

## 2016-09-18 DIAGNOSIS — F411 Generalized anxiety disorder: Secondary | ICD-10-CM | POA: Diagnosis not present

## 2016-09-18 DIAGNOSIS — E784 Other hyperlipidemia: Secondary | ICD-10-CM | POA: Diagnosis not present

## 2016-09-18 DIAGNOSIS — M810 Age-related osteoporosis without current pathological fracture: Secondary | ICD-10-CM | POA: Diagnosis not present

## 2016-09-18 DIAGNOSIS — G40201 Localization-related (focal) (partial) symptomatic epilepsy and epileptic syndromes with complex partial seizures, not intractable, with status epilepticus: Secondary | ICD-10-CM | POA: Diagnosis not present

## 2016-09-18 DIAGNOSIS — E038 Other specified hypothyroidism: Secondary | ICD-10-CM | POA: Diagnosis not present

## 2016-09-18 DIAGNOSIS — K739 Chronic hepatitis, unspecified: Secondary | ICD-10-CM | POA: Diagnosis not present

## 2016-09-18 DIAGNOSIS — E559 Vitamin D deficiency, unspecified: Secondary | ICD-10-CM | POA: Diagnosis not present

## 2016-09-18 DIAGNOSIS — N951 Menopausal and female climacteric states: Secondary | ICD-10-CM | POA: Diagnosis not present

## 2016-09-24 DIAGNOSIS — R21 Rash and other nonspecific skin eruption: Secondary | ICD-10-CM | POA: Diagnosis not present

## 2016-09-24 DIAGNOSIS — Z6822 Body mass index (BMI) 22.0-22.9, adult: Secondary | ICD-10-CM | POA: Diagnosis not present

## 2016-09-24 MED FILL — predniSONE 10 MG TABS: 10 | 15 days supply | Qty: 27 | Fill #0

## 2016-09-25 MED FILL — ESTRADIOL 1 MG TABLET: 1 | 30 days supply | Qty: 30 | Fill #10

## 2016-09-25 MED FILL — CARBAMAZEPINE ER 200 MG TAB: 200 | 30 days supply | Qty: 120 | Fill #9

## 2016-09-25 MED FILL — acetaZOLAMIDE 250 MG TABS: 250 | 30 days supply | Qty: 60 | Fill #10

## 2016-09-25 MED FILL — ROSUVASTATIN CALCIUM 40 MG: 40 | 30 days supply | Qty: 30 | Fill #0

## 2016-09-25 MED FILL — FOLIC ACID 1 MG TABLET: 1 | 30 days supply | Qty: 30 | Fill #2

## 2016-09-25 MED FILL — PROGESTERONE 100 MG CAPSULE: 100 | 30 days supply | Qty: 15 | Fill #11

## 2016-09-25 MED FILL — SYNTHROID 75 MCG TABLET: 75 | 30 days supply | Qty: 30 | Fill #3

## 2016-09-27 MED FILL — OLMESARTAN MEDOXOMIL 20 MG: 20 | 30 days supply | Qty: 30 | Fill #0

## 2016-10-01 MED FILL — DEXILANT DR 60 MG CAPSULE: 60 | 30 days supply | Qty: 30 | Fill #1

## 2016-10-04 DIAGNOSIS — Z85828 Personal history of other malignant neoplasm of skin: Secondary | ICD-10-CM

## 2016-10-04 HISTORY — DX: Personal history of other malignant neoplasm of skin: Z85.828

## 2016-10-23 DIAGNOSIS — L921 Necrobiosis lipoidica, not elsewhere classified: Secondary | ICD-10-CM | POA: Diagnosis not present

## 2016-10-23 DIAGNOSIS — C44719 Basal cell carcinoma of skin of left lower limb, including hip: Secondary | ICD-10-CM | POA: Diagnosis not present

## 2016-10-23 MED FILL — CLOBETASOL 0.05% CREAM: 0.05 | 20 days supply | Qty: 60 | Fill #0

## 2016-10-24 ENCOUNTER — Other Ambulatory Visit: Payer: Self-pay | Admitting: Obstetrics and Gynecology

## 2016-10-24 MED FILL — FOLIC ACID 1 MG TABLET: 1 | 30 days supply | Qty: 30 | Fill #3

## 2016-10-24 MED FILL — ROSUVASTATIN CALCIUM 40 MG: 40 | 30 days supply | Qty: 30 | Fill #1

## 2016-10-24 MED FILL — CARBAMAZEPINE ER 200 MG TAB: 200 | 30 days supply | Qty: 120 | Fill #10

## 2016-10-24 MED FILL — acetaZOLAMIDE 250 MG TABS: 250 | 30 days supply | Qty: 60 | Fill #11

## 2016-10-24 MED FILL — SYNTHROID 75 MCG TABLET: 75 | 30 days supply | Qty: 30 | Fill #0

## 2016-10-25 MED FILL — ESTRADIOL 1 MG TABLET: 1 | 30 days supply | Qty: 30 | Fill #0

## 2016-10-25 MED FILL — DEXILANT DR 60 MG CAPSULE: 60 | 30 days supply | Qty: 30 | Fill #2

## 2016-10-25 MED FILL — PROGESTERONE 100 MG CAPSULE: 100 | 30 days supply | Qty: 15 | Fill #0

## 2016-11-04 DIAGNOSIS — L921 Necrobiosis lipoidica, not elsewhere classified: Secondary | ICD-10-CM | POA: Diagnosis not present

## 2016-11-04 DIAGNOSIS — Z85828 Personal history of other malignant neoplasm of skin: Secondary | ICD-10-CM | POA: Diagnosis not present

## 2016-11-04 DIAGNOSIS — Z08 Encounter for follow-up examination after completed treatment for malignant neoplasm: Secondary | ICD-10-CM | POA: Diagnosis not present

## 2016-11-18 DIAGNOSIS — K7460 Unspecified cirrhosis of liver: Secondary | ICD-10-CM | POA: Diagnosis not present

## 2016-11-20 DIAGNOSIS — L921 Necrobiosis lipoidica, not elsewhere classified: Secondary | ICD-10-CM | POA: Diagnosis not present

## 2016-11-25 MED FILL — ROSUVASTATIN CALCIUM 40 MG: 40 | 30 days supply | Qty: 30 | Fill #2

## 2016-11-25 MED FILL — OLMESARTAN MEDOXOMIL 20 MG: 20 | 30 days supply | Qty: 30 | Fill #1

## 2016-11-25 MED FILL — PROGESTERONE 100 MG CAPSULE: 100 | 30 days supply | Qty: 15 | Fill #1

## 2016-11-25 MED FILL — VIT D2 1.25 MG (50,000 UNIT: 1.25 MG | 84 days supply | Qty: 12 | Fill #0

## 2016-11-25 MED FILL — DEXILANT DR 60 MG CAPSULE: 60 | 30 days supply | Qty: 30 | Fill #0

## 2016-11-25 MED FILL — CARBAMAZEPINE ER 200 MG TAB: 200 | 30 days supply | Qty: 120 | Fill #11

## 2016-11-25 MED FILL — SYNTHROID 75 MCG TABLET: 75 | 30 days supply | Qty: 30 | Fill #1

## 2016-11-25 MED FILL — FOLIC ACID 1 MG TABLET: 1 | 30 days supply | Qty: 30 | Fill #4

## 2016-11-25 MED FILL — ESTRADIOL 1 MG TABLET: 1 | 30 days supply | Qty: 30 | Fill #1

## 2016-11-26 MED FILL — acetaZOLAMIDE 250 MG TABS: 250 | 30 days supply | Qty: 60 | Fill #0

## 2016-12-09 DIAGNOSIS — G40209 Localization-related (focal) (partial) symptomatic epilepsy and epileptic syndromes with complex partial seizures, not intractable, without status epilepticus: Secondary | ICD-10-CM | POA: Diagnosis not present

## 2016-12-09 DIAGNOSIS — E039 Hypothyroidism, unspecified: Secondary | ICD-10-CM | POA: Diagnosis not present

## 2016-12-09 DIAGNOSIS — M8580 Other specified disorders of bone density and structure, unspecified site: Secondary | ICD-10-CM | POA: Diagnosis not present

## 2016-12-09 DIAGNOSIS — G40219 Localization-related (focal) (partial) symptomatic epilepsy and epileptic syndromes with complex partial seizures, intractable, without status epilepticus: Secondary | ICD-10-CM | POA: Diagnosis not present

## 2016-12-09 DIAGNOSIS — Z8619 Personal history of other infectious and parasitic diseases: Secondary | ICD-10-CM | POA: Diagnosis not present

## 2016-12-09 DIAGNOSIS — K7460 Unspecified cirrhosis of liver: Secondary | ICD-10-CM | POA: Diagnosis not present

## 2016-12-09 DIAGNOSIS — Z859 Personal history of malignant neoplasm, unspecified: Secondary | ICD-10-CM | POA: Diagnosis not present

## 2016-12-09 DIAGNOSIS — G40119 Localization-related (focal) (partial) symptomatic epilepsy and epileptic syndromes with simple partial seizures, intractable, without status epilepticus: Secondary | ICD-10-CM | POA: Diagnosis not present

## 2016-12-09 DIAGNOSIS — C9191 Lymphoid leukemia, unspecified, in remission: Secondary | ICD-10-CM | POA: Diagnosis not present

## 2016-12-09 DIAGNOSIS — M810 Age-related osteoporosis without current pathological fracture: Secondary | ICD-10-CM | POA: Diagnosis not present

## 2016-12-09 DIAGNOSIS — Z85810 Personal history of malignant neoplasm of tongue: Secondary | ICD-10-CM | POA: Diagnosis not present

## 2016-12-09 DIAGNOSIS — D696 Thrombocytopenia, unspecified: Secondary | ICD-10-CM | POA: Diagnosis not present

## 2016-12-30 MED FILL — FOLIC ACID 1 MG TABLET: 1 | 30 days supply | Qty: 30 | Fill #5

## 2016-12-30 MED FILL — DEXILANT DR 60 MG CAPSULE: 60 | 30 days supply | Qty: 30 | Fill #1

## 2016-12-30 MED FILL — ESTRADIOL 1 MG TABLET: 1 | 30 days supply | Qty: 30 | Fill #2

## 2016-12-30 MED FILL — PROGESTERONE 100 MG CAPSULE: 100 | 30 days supply | Qty: 15 | Fill #2

## 2016-12-30 MED FILL — acetaZOLAMIDE 250 MG TABS: 250 | 30 days supply | Qty: 60 | Fill #1

## 2016-12-30 MED FILL — CARBAMAZEPINE ER 200 MG TAB: 200 | 30 days supply | Qty: 120 | Fill #0

## 2016-12-30 MED FILL — ROSUVASTATIN CALCIUM 40 MG: 40 | 30 days supply | Qty: 30 | Fill #3

## 2016-12-30 MED FILL — SYNTHROID 75 MCG TABLET: 75 | 30 days supply | Qty: 30 | Fill #2

## 2017-01-23 DIAGNOSIS — L921 Necrobiosis lipoidica, not elsewhere classified: Secondary | ICD-10-CM | POA: Diagnosis not present

## 2017-01-27 MED FILL — DEXILANT DR 60 MG CAPSULE: 60 | 30 days supply | Qty: 30 | Fill #2

## 2017-01-27 MED FILL — SYNTHROID 75 MCG TABLET: 75 | 30 days supply | Qty: 30 | Fill #3

## 2017-01-27 MED FILL — PROGESTERONE 100 MG CAPSULE: 100 | 30 days supply | Qty: 15 | Fill #3

## 2017-01-27 MED FILL — ROSUVASTATIN CALCIUM 40 MG: 40 | 30 days supply | Qty: 30 | Fill #4

## 2017-01-27 MED FILL — CARBAMAZEPINE ER 200 MG TAB: 200 | 30 days supply | Qty: 120 | Fill #1

## 2017-01-27 MED FILL — ESTRADIOL 1 MG TABLET: 1 | 30 days supply | Qty: 30 | Fill #3

## 2017-01-27 MED FILL — acetaZOLAMIDE 250 MG TABS: 250 | 30 days supply | Qty: 60 | Fill #2

## 2017-01-27 MED FILL — OLMESARTAN MEDOXOMIL 20 MG: 20 | 30 days supply | Qty: 30 | Fill #2

## 2017-01-27 MED FILL — CLOBETASOL 0.05% CREAM: 0.05 | 20 days supply | Qty: 60 | Fill #1

## 2017-01-27 MED FILL — FOLIC ACID 1 MG TABLET: 1 | 30 days supply | Qty: 30 | Fill #6

## 2017-01-31 MED FILL — VIT D2 1.25 MG (50,000 UNIT: 1.25 MG | 84 days supply | Qty: 12 | Fill #1

## 2017-02-17 DIAGNOSIS — N261 Atrophy of kidney (terminal): Secondary | ICD-10-CM | POA: Diagnosis not present

## 2017-02-17 DIAGNOSIS — K7460 Unspecified cirrhosis of liver: Secondary | ICD-10-CM | POA: Diagnosis not present

## 2017-02-17 DIAGNOSIS — N186 End stage renal disease: Secondary | ICD-10-CM | POA: Diagnosis not present

## 2017-02-17 DIAGNOSIS — R160 Hepatomegaly, not elsewhere classified: Secondary | ICD-10-CM | POA: Diagnosis not present

## 2017-02-17 DIAGNOSIS — Z23 Encounter for immunization: Secondary | ICD-10-CM | POA: Diagnosis not present

## 2017-02-17 DIAGNOSIS — Z8619 Personal history of other infectious and parasitic diseases: Secondary | ICD-10-CM | POA: Diagnosis not present

## 2017-02-20 DIAGNOSIS — E878 Other disorders of electrolyte and fluid balance, not elsewhere classified: Secondary | ICD-10-CM | POA: Diagnosis not present

## 2017-02-24 DIAGNOSIS — R7981 Abnormal blood-gas level: Secondary | ICD-10-CM | POA: Diagnosis not present

## 2017-02-24 DIAGNOSIS — Z85828 Personal history of other malignant neoplasm of skin: Secondary | ICD-10-CM | POA: Diagnosis not present

## 2017-02-24 MED FILL — SYNTHROID 75 MCG TABLET: 75 | 30 days supply | Qty: 30 | Fill #4

## 2017-02-24 MED FILL — FOLIC ACID 1 MG TABLET: 1 | 30 days supply | Qty: 30 | Fill #7

## 2017-02-24 MED FILL — acetaZOLAMIDE 250 MG TABS: 250 | 30 days supply | Qty: 60 | Fill #3

## 2017-02-24 MED FILL — ROSUVASTATIN CALCIUM 40 MG: 40 | 30 days supply | Qty: 30 | Fill #5

## 2017-02-24 MED FILL — ESTRADIOL 1 MG TABLET: 1 | 30 days supply | Qty: 30 | Fill #4

## 2017-02-24 MED FILL — PROGESTERONE 100 MG CAPSULE: 100 | 30 days supply | Qty: 15 | Fill #4

## 2017-02-24 MED FILL — CARBAMAZEPINE ER 200 MG TAB: 200 | 30 days supply | Qty: 120 | Fill #2

## 2017-02-27 MED FILL — DEXILANT DR 60 MG CAPSULE: 60 | 30 days supply | Qty: 30 | Fill #0

## 2017-03-14 DIAGNOSIS — M810 Age-related osteoporosis without current pathological fracture: Secondary | ICD-10-CM | POA: Diagnosis not present

## 2017-03-14 DIAGNOSIS — E7849 Other hyperlipidemia: Secondary | ICD-10-CM | POA: Diagnosis not present

## 2017-03-14 DIAGNOSIS — D471 Chronic myeloproliferative disease: Secondary | ICD-10-CM | POA: Diagnosis not present

## 2017-03-14 DIAGNOSIS — E038 Other specified hypothyroidism: Secondary | ICD-10-CM | POA: Diagnosis not present

## 2017-03-14 DIAGNOSIS — F411 Generalized anxiety disorder: Secondary | ICD-10-CM | POA: Diagnosis not present

## 2017-03-17 ENCOUNTER — Other Ambulatory Visit: Payer: Self-pay

## 2017-03-17 ENCOUNTER — Encounter (HOSPITAL_COMMUNITY): Payer: 59

## 2017-03-17 ENCOUNTER — Encounter (HOSPITAL_COMMUNITY): Payer: Self-pay | Admitting: Oncology

## 2017-03-17 ENCOUNTER — Encounter (HOSPITAL_COMMUNITY): Payer: 59 | Attending: Oncology | Admitting: Oncology

## 2017-03-17 DIAGNOSIS — D6959 Other secondary thrombocytopenia: Secondary | ICD-10-CM | POA: Diagnosis not present

## 2017-03-17 DIAGNOSIS — B1920 Unspecified viral hepatitis C without hepatic coma: Secondary | ICD-10-CM

## 2017-03-17 DIAGNOSIS — Z856 Personal history of leukemia: Secondary | ICD-10-CM | POA: Diagnosis not present

## 2017-03-17 DIAGNOSIS — D696 Thrombocytopenia, unspecified: Secondary | ICD-10-CM | POA: Diagnosis not present

## 2017-03-17 DIAGNOSIS — Z9481 Bone marrow transplant status: Secondary | ICD-10-CM | POA: Diagnosis not present

## 2017-03-17 DIAGNOSIS — Z85810 Personal history of malignant neoplasm of tongue: Secondary | ICD-10-CM | POA: Insufficient documentation

## 2017-03-17 DIAGNOSIS — K7460 Unspecified cirrhosis of liver: Secondary | ICD-10-CM | POA: Diagnosis not present

## 2017-03-17 DIAGNOSIS — Z9049 Acquired absence of other specified parts of digestive tract: Secondary | ICD-10-CM | POA: Insufficient documentation

## 2017-03-17 LAB — CBC WITH DIFFERENTIAL/PLATELET
Basophils Absolute: 0 10*3/uL (ref 0.0–0.1)
Basophils Relative: 0 %
EOS PCT: 1 %
Eosinophils Absolute: 0.1 10*3/uL (ref 0.0–0.7)
HCT: 37 % (ref 36.0–46.0)
Hemoglobin: 12.3 g/dL (ref 12.0–15.0)
LYMPHS ABS: 3 10*3/uL (ref 0.7–4.0)
LYMPHS PCT: 44 %
MCH: 34.4 pg — AB (ref 26.0–34.0)
MCHC: 33.2 g/dL (ref 30.0–36.0)
MCV: 103.4 fL — AB (ref 78.0–100.0)
MONO ABS: 0.5 10*3/uL (ref 0.1–1.0)
MONOS PCT: 7 %
Neutro Abs: 3.2 10*3/uL (ref 1.7–7.7)
Neutrophils Relative %: 48 %
Platelets: 193 10*3/uL (ref 150–400)
RBC: 3.58 MIL/uL — ABNORMAL LOW (ref 3.87–5.11)
RDW: 13 % (ref 11.5–15.5)
WBC: 6.7 10*3/uL (ref 4.0–10.5)

## 2017-03-17 LAB — IRON AND TIBC
Iron: 103 ug/dL (ref 28–170)
SATURATION RATIOS: 33 % — AB (ref 10.4–31.8)
TIBC: 312 ug/dL (ref 250–450)
UIBC: 209 ug/dL

## 2017-03-17 LAB — FOLATE: Folate: 57.3 ng/mL (ref >5.9)

## 2017-03-17 LAB — VITAMIN B12: Vitamin B-12: 3054 pg/mL — ABNORMAL HIGH (ref 180–914)

## 2017-03-17 LAB — FERRITIN: Ferritin: 176 ng/mL (ref 11–307)

## 2017-03-17 NOTE — Patient Instructions (Signed)
[facility] at [facility] Discharge Instructions  RECOMMENDATIONS MADE BY THE CONSULTANT AND ANY TEST RESULTS WILL BE SENT TO YOUR REFERRING PHYSICIAN.  You were seen today by Dr. Louise Zhou Follow up in 1 year with labs   Thank you for choosing [facility] at [facility] to provide your oncology and hematology care.  To afford each patient quality time with our provider, please arrive at least 15 minutes before your scheduled appointment time.    If you have a lab appointment with the Cancer Center please come in thru the  Main Entrance and check in at the main information desk  You need to re-schedule your appointment should you arrive 10 or more minutes late.  We strive to give you quality time with our providers, and arriving late affects you and other patients whose appointments are after yours.  Also, if you no show three or more times for appointments you may be dismissed from the clinic at the providers discretion.     Again, thank you for choosing [facility].  Our hope is that these requests will decrease the amount of time that you wait before being seen by our physicians.       _____________________________________________________________  Should you have questions after your visit to  Cancer Center, please contact our office at [phone] between the hours of 8:30 a.m. and 4:30 p.m.  Voicemails left after 4:30 p.m. will not be returned until the following business day.  For prescription refill requests, have your pharmacy contact our office.       Resources For Cancer Patients and their Caregivers ? American Cancer Society: Can assist with transportation, wigs, general needs, runs Look Good Feel Better.        1-888-227-6333 ? Cancer Care: Provides financial assistance, online support groups, medication/co-pay assistance.  1-800-813-HOPE (4673) ? [facility] Assists [place] cancer patients and their families through emotional , educational and financial support.  336-427-4357 ? [place] DSS Where to apply for food stamps, Medicaid and utility assistance. 336-342-1394 ? RCATS: Transportation to medical appointments. 336-347-2287 ? Social Security Administration: May apply for disability if have a Stage IV cancer. 336-342-7796 1-800-772-1213 ? [place] Aging, Disability and Transit Services: Assists with nutrition, care and transit needs. 336-349-2343  Cancer Center Support Programs: @10RELATIVEDAYS@ > Cancer Support Group  2nd Tuesday of the month 1pm-2pm, Journey Room  > Creative Journey  3rd Tuesday of the month 1130am-1pm, Journey Room  > Look Good Feel Better  1st Wednesday of the month 10am-12 noon, Journey Room (Call American Cancer Society to register 1-800-395-5775)    

## 2017-03-17 NOTE — Progress Notes (Signed)
[facility] Follow up:    Latoya Squibb, MD [place] [address]   DIAGNOSIS: Thrombocytopenia in the setting of liver cirrhosis due to underlying [place].  INTERVAL HISTORY: Latoya Bautista 46 y.o. female  presents today for continue follow-up.  She states that she recently had a tongue biopsy last week because her dentist spotted an abnormal looking spot on her tongue.  She was seen oral surgeon last week who did a biopsy, the pathology results are pending.  She also has been told that her CO2 levels are low by her [facility] doctors, and she is following with her PCP for her CO2 level.  She has an appointment with her PCP later this week.  She denies any positive bleeding or bruising.  She denies any chest pain, shortness of breath, abdominal pain, focal weakness, fatigue, weight loss.  Patient Active Problem List   Diagnosis Date Noted  . Routine gynecological examination 10/18/2013  . Localization-related epilepsy ([place]) 01/01/2013  . Partial epilepsy with impairment of consciousness ([place]) 12/15/2012  . Postmenopausal bleeding DUE TO hormone tx. 10/08/2012  . Hepatitis C 04/11/2011  . Liver cirrhosis ([place]) 04/11/2011  . Thrombocytopenia ([place]) 04/11/2011    is allergic to simvastatin; asparaginase derivatives; elspar [asparaginase]; and soap.  MEDICAL HISTORY: Past Medical History:  Diagnosis Date  . Anemia   . Cancer (HCC)    tongue cancer  . Hepatitis C   . Hepatitis C 04/11/2011  . Leukemia ([place])   . Liver cirrhosis ([place]) 04/11/2011  . Osteoporosis   . Seizures ([place])   . Shingles   . Thrombocytopenia ([place]) 04/11/2011  . Thyroid disease     SURGICAL HISTORY: Past Surgical History:  Procedure Laterality Date  . BONE MARROW BIOPSY  05/2011  . BONE MARROW TRANSPLANT    . CHOLECYSTECTOMY    . liver biopsie    . TONGUE BIOPSY    . tongue cancer     surgical removal of area    SOCIAL  HISTORY: Social History   Socioeconomic History  . Marital status: Married    Spouse name: Latoya Bautista  . Number of children: 0  . Years of education: 12th  . Highest education level: Not on file  Social Needs  . Financial resource strain: Not on file  . Food insecurity - worry: Not on file  . Food insecurity - inability: Not on file  . Transportation needs - medical: Not on file  . Transportation needs - non-medical: Not on file  Occupational History    Employer: [employer]  Tobacco Use  . Smoking status: Never Smoker  . Smokeless tobacco: Never Used  Substance and Sexual Activity  . Alcohol use: No  . Drug use: No  . Sexual activity: No    Birth control/protection: None, Post-menopausal  Other Topics Concern  . Not on file  Social History Narrative   Patient lives at home with her spouse.   Caffeine Use: 16oz bottle of soda daily    FAMILY HISTORY: Family History  Problem Relation Age of Onset  . Hypertension Father   . Diabetes Maternal Uncle     Review of Systems - Oncology  ROS as per HPI otherwise 12 point ROS is negative.   PHYSICAL EXAMINATION RN vitals reviewed.  Physical Exam Constitutional: Well-developed, well-nourished, and in no distress.   HENT:  Head: Normocephalic and atraumatic.  Mouth/Throat: +stitches on the right lateral tongue. Mucosa moist.  Eyes: Pupils are equal, round, and reactive to light. Conjunctivae are normal. No scleral icterus.  Neck: Normal range of motion. Neck supple. No JVD present.  Cardiovascular: Normal rate, regular rhythm and normal heart sounds.  Exam reveals no gallop and no friction rub.   No murmur heard. Pulmonary/Chest: Effort normal and breath sounds normal. No respiratory distress. No wheezes.No rales.  Abdominal: Soft. Bowel sounds are normal. No distension. There is no tenderness. There is no guarding.  Musculoskeletal: No edema or tenderness.  Lymphadenopathy:    No cervical or supraclavicular  adenopathy.  Neurological: Alert and oriented to person, place, and time. No cranial nerve deficit.  Skin: Skin is warm and dry. No rash noted. No erythema. No pallor.  Psychiatric: Affect and judgment normal.    LABORATORY DATA:  CBC    Component Value Date/Time   WBC 6.7 03/17/2017 0853   RBC 3.58 (L) 03/17/2017 0853   HGB 12.3 03/17/2017 0853   HCT 37.0 03/17/2017 0853   PLT 193 03/17/2017 0853   MCV 103.4 (H) 03/17/2017 0853   MCH 34.4 (H) 03/17/2017 0853   MCHC 33.2 03/17/2017 0853   RDW 13.0 03/17/2017 0853   LYMPHSABS 3.0 03/17/2017 0853   MONOABS 0.5 03/17/2017 0853   EOSABS 0.1 03/17/2017 0853   BASOSABS 0.0 03/17/2017 0853    CMP     Component Value Date/Time   NA 141 03/17/2015 2112   K 4.3 03/17/2015 2112   CL 107 03/17/2015 2112   CO2 15 (L) 07/28/2014 0934   GLUCOSE 107 (H) 03/17/2015 2112   BUN 10 03/17/2015 2112   CREATININE 0.70 03/17/2015 2112   CALCIUM 7.3 (L) 07/28/2014 0934   CALCIUM 8.2 (L) 12/19/2009 1013   PROT 8.3 07/28/2014 0640   ALBUMIN 4.3 07/28/2014 0640   AST 32 07/28/2014 0640   ALT 27 07/28/2014 0640   ALKPHOS 56 07/28/2014 0640   BILITOT 0.4 07/28/2014 0640   GFRNONAA 74 (L) 07/28/2014 0934   GFRAA 86 (L) 07/28/2014 0934       PENDING LABS:   RADIOGRAPHIC STUDIES:  No results found.   PATHOLOGY:     ASSESSMENT and THERAPY PLAN:  Thrombocytopenia Thrombocytopenia secondary to cirrhosis of liver with H/O Hepatitis C, in remission. She is doing well no longer on her antiviral therapy. She completed 44 weeks of telaprevir triple based therapy for the treatment of her hepatitis C treatment. Completion date was in January 2012. Treatment was discontinued 4 weeks early due to worsening anemia. Her HCV RNA 6 months post treatment remained undetectable confirming SVR. Most recent HCV RNA level 11/04/2012 continued to validate SVR (HCV RNA -non-detected).  Her hepatologist is Latoya Bautista at [facility].  She had been  treated previously on two occassions and was a partial responder at best which had been complicated by neutropenia and anemia. She has a history of childhood leukemia (ALL) and underwent two bone marrow transplants (1982 and 1984). She denies any further treatment since last bone marrow transplant.   PLAN: -Reviewed her lab work with her today. Her platelet count is 193k today. Review of her platelet counts for the past 2 years has revealed that she has not been thrombocytopenic. -Follow up with OMFS for tongue biopsy results.  -RTC in 1 year for follow up with labs below.    Orders Placed This Encounter  Procedures  . CBC with Differential    Standing Status:   Future    Standing Expiration Date:   03/17/2018  .  Comprehensive metabolic panel    Standing Status:   Future    Standing Expiration Date:   09/15/2018    All questions were answered. The patient knows to call the clinic with any problems, questions or concerns. We can certainly see the patient much sooner if necessary.  This note was electronically signed. Twana First, MD 03/17/2017

## 2017-03-18 DIAGNOSIS — K1329 Other disturbances of oral epithelium, including tongue: Secondary | ICD-10-CM | POA: Diagnosis not present

## 2017-03-20 DIAGNOSIS — R7301 Impaired fasting glucose: Secondary | ICD-10-CM | POA: Diagnosis not present

## 2017-03-20 DIAGNOSIS — E782 Mixed hyperlipidemia: Secondary | ICD-10-CM | POA: Diagnosis not present

## 2017-03-20 DIAGNOSIS — K7460 Unspecified cirrhosis of liver: Secondary | ICD-10-CM | POA: Diagnosis not present

## 2017-03-20 DIAGNOSIS — B182 Chronic viral hepatitis C: Secondary | ICD-10-CM | POA: Diagnosis not present

## 2017-03-20 DIAGNOSIS — Z85828 Personal history of other malignant neoplasm of skin: Secondary | ICD-10-CM | POA: Diagnosis not present

## 2017-03-20 DIAGNOSIS — E039 Hypothyroidism, unspecified: Secondary | ICD-10-CM | POA: Diagnosis not present

## 2017-03-20 DIAGNOSIS — R7981 Abnormal blood-gas level: Secondary | ICD-10-CM | POA: Diagnosis not present

## 2017-03-20 DIAGNOSIS — L270 Generalized skin eruption due to drugs and medicaments taken internally: Secondary | ICD-10-CM | POA: Diagnosis not present

## 2017-03-21 DIAGNOSIS — K739 Chronic hepatitis, unspecified: Secondary | ICD-10-CM | POA: Diagnosis not present

## 2017-03-21 DIAGNOSIS — E7800 Pure hypercholesterolemia, unspecified: Secondary | ICD-10-CM | POA: Diagnosis not present

## 2017-03-21 DIAGNOSIS — M810 Age-related osteoporosis without current pathological fracture: Secondary | ICD-10-CM | POA: Diagnosis not present

## 2017-03-21 DIAGNOSIS — G40201 Localization-related (focal) (partial) symptomatic epilepsy and epileptic syndromes with complex partial seizures, not intractable, with status epilepticus: Secondary | ICD-10-CM | POA: Diagnosis not present

## 2017-03-21 DIAGNOSIS — F411 Generalized anxiety disorder: Secondary | ICD-10-CM | POA: Diagnosis not present

## 2017-03-21 DIAGNOSIS — E038 Other specified hypothyroidism: Secondary | ICD-10-CM | POA: Diagnosis not present

## 2017-03-21 DIAGNOSIS — E559 Vitamin D deficiency, unspecified: Secondary | ICD-10-CM | POA: Diagnosis not present

## 2017-03-21 DIAGNOSIS — D471 Chronic myeloproliferative disease: Secondary | ICD-10-CM | POA: Diagnosis not present

## 2017-03-21 DIAGNOSIS — N951 Menopausal and female climacteric states: Secondary | ICD-10-CM | POA: Diagnosis not present

## 2017-03-21 MED FILL — SYNTHROID 75 MCG TABLET: 75 | 30 days supply | Qty: 30 | Fill #0

## 2017-03-21 MED FILL — ROSUVASTATIN CALCIUM 40 MG: 40 | 30 days supply | Qty: 30 | Fill #0

## 2017-03-24 ENCOUNTER — Other Ambulatory Visit (HOSPITAL_COMMUNITY): Payer: Self-pay | Admitting: Oncology

## 2017-03-24 DIAGNOSIS — K7460 Unspecified cirrhosis of liver: Secondary | ICD-10-CM

## 2017-03-24 DIAGNOSIS — D696 Thrombocytopenia, unspecified: Secondary | ICD-10-CM

## 2017-03-24 MED FILL — CARBAMAZEPINE ER 200 MG TAB: 200 | 30 days supply | Qty: 120 | Fill #3

## 2017-03-24 MED FILL — PROGESTERONE 100 MG CAPSULE: 100 | 30 days supply | Qty: 15 | Fill #5

## 2017-03-24 MED FILL — acetaZOLAMIDE 250 MG TABS: 250 | 30 days supply | Qty: 60 | Fill #4

## 2017-03-24 MED FILL — FOLIC ACID 1 MG TABLET: 1 | 30 days supply | Qty: 30 | Fill #0

## 2017-03-24 MED FILL — ESTRADIOL 1 MG TABLET: 1 | 30 days supply | Qty: 30 | Fill #5

## 2017-03-25 MED FILL — DEXILANT DR 60 MG CAPSULE: 60 | 30 days supply | Qty: 30 | Fill #1

## 2017-03-31 MED FILL — BRIVIACT 25 MG TABLET: 25 | 14 days supply | Qty: 28 | Fill #0

## 2017-04-16 MED FILL — BRIVIACT 50 MG TABLET: 50 | 30 days supply | Qty: 60 | Fill #0

## 2017-04-21 DIAGNOSIS — G40209 Localization-related (focal) (partial) symptomatic epilepsy and epileptic syndromes with complex partial seizures, not intractable, without status epilepticus: Secondary | ICD-10-CM | POA: Diagnosis not present

## 2017-04-21 DIAGNOSIS — E872 Acidosis: Secondary | ICD-10-CM | POA: Diagnosis not present

## 2017-04-21 DIAGNOSIS — E878 Other disorders of electrolyte and fluid balance, not elsewhere classified: Secondary | ICD-10-CM | POA: Diagnosis not present

## 2017-04-21 DIAGNOSIS — Z888 Allergy status to other drugs, medicaments and biological substances status: Secondary | ICD-10-CM | POA: Diagnosis not present

## 2017-04-21 DIAGNOSIS — C9191 Lymphoid leukemia, unspecified, in remission: Secondary | ICD-10-CM | POA: Diagnosis not present

## 2017-04-21 DIAGNOSIS — Z833 Family history of diabetes mellitus: Secondary | ICD-10-CM | POA: Diagnosis not present

## 2017-04-21 DIAGNOSIS — E039 Hypothyroidism, unspecified: Secondary | ICD-10-CM | POA: Diagnosis not present

## 2017-04-21 DIAGNOSIS — M8580 Other specified disorders of bone density and structure, unspecified site: Secondary | ICD-10-CM | POA: Diagnosis not present

## 2017-04-21 MED FILL — PROGESTERONE 100 MG CAPSULE: 100 | 30 days supply | Qty: 15 | Fill #6

## 2017-04-21 MED FILL — ESTRADIOL 1 MG TABLET: 1 | 30 days supply | Qty: 30 | Fill #6

## 2017-04-21 MED FILL — ROSUVASTATIN CALCIUM 40 MG: 40 | 30 days supply | Qty: 30 | Fill #1

## 2017-04-21 MED FILL — VIT D2 1.25 MG (50,000 UNIT: 1.25 MG | 84 days supply | Qty: 12 | Fill #0

## 2017-04-21 MED FILL — OLMESARTAN MEDOXOMIL 20 MG: 20 | 30 days supply | Qty: 30 | Fill #3

## 2017-04-21 MED FILL — CARBAMAZEPINE ER 200 MG TAB: 200 | 30 days supply | Qty: 120 | Fill #4

## 2017-04-21 MED FILL — FOLIC ACID 1 MG TABLET: 1 | 30 days supply | Qty: 30 | Fill #1

## 2017-04-21 MED FILL — DEXILANT DR 60 MG CAPSULE: 60 | 30 days supply | Qty: 30 | Fill #2

## 2017-04-21 MED FILL — SYNTHROID 75 MCG TABLET: 75 | 30 days supply | Qty: 30 | Fill #1

## 2017-04-23 DIAGNOSIS — H5213 Myopia, bilateral: Secondary | ICD-10-CM | POA: Diagnosis not present

## 2017-04-23 DIAGNOSIS — H524 Presbyopia: Secondary | ICD-10-CM | POA: Diagnosis not present

## 2017-05-20 MED FILL — BRIVIACT 50 MG TABLET: 50 | 30 days supply | Qty: 60 | Fill #1

## 2017-05-20 MED FILL — OLMESARTAN MEDOXOMIL 20 MG: 20 | 30 days supply | Qty: 30 | Fill #4

## 2017-05-20 MED FILL — ROSUVASTATIN CALCIUM 40 MG: 40 | 30 days supply | Qty: 30 | Fill #2

## 2017-05-20 MED FILL — ESTRADIOL 1 MG TABLET: 1 | 30 days supply | Qty: 30 | Fill #7

## 2017-05-20 MED FILL — FOLIC ACID 1 MG TABLET: 1 | 30 days supply | Qty: 30 | Fill #2

## 2017-05-20 MED FILL — CARBAMAZEPINE ER 200 MG TAB: 200 | 30 days supply | Qty: 120 | Fill #5

## 2017-05-20 MED FILL — SYNTHROID 75 MCG TABLET: 75 | 30 days supply | Qty: 30 | Fill #2

## 2017-05-20 MED FILL — PROGESTERONE 100 MG CAPSULE: 100 | 30 days supply | Qty: 15 | Fill #7

## 2017-05-30 MED FILL — DEXILANT DR 60 MG CAPSULE: 60 | 30 days supply | Qty: 30 | Fill #0

## 2017-06-05 MED FILL — DICLOFENAC SODIUM 75 MG TAB: 75 | 14 days supply | Qty: 28 | Fill #0

## 2017-06-23 MED FILL — SYNTHROID 75 MCG TABLET: 75 | 30 days supply | Qty: 30 | Fill #3

## 2017-06-23 MED FILL — DEXILANT DR 60 MG CAPSULE: 60 | 30 days supply | Qty: 30 | Fill #1

## 2017-06-23 MED FILL — ESTRADIOL 1 MG TABLET: 1 | 30 days supply | Qty: 30 | Fill #8

## 2017-06-23 MED FILL — ROSUVASTATIN CALCIUM 40 MG: 40 | 30 days supply | Qty: 30 | Fill #3

## 2017-06-23 MED FILL — PROGESTERONE 100 MG CAPSULE: 100 | 30 days supply | Qty: 15 | Fill #8

## 2017-06-23 MED FILL — FOLIC ACID 1 MG TABLET: 1 | 30 days supply | Qty: 30 | Fill #3

## 2017-06-23 MED FILL — OLMESARTAN MEDOXOMIL 20 MG: 20 | 30 days supply | Qty: 30 | Fill #5

## 2017-07-02 DIAGNOSIS — R002 Palpitations: Secondary | ICD-10-CM | POA: Diagnosis not present

## 2017-07-02 DIAGNOSIS — G40A09 Absence epileptic syndrome, not intractable, without status epilepticus: Secondary | ICD-10-CM | POA: Diagnosis not present

## 2017-07-02 DIAGNOSIS — Z85828 Personal history of other malignant neoplasm of skin: Secondary | ICD-10-CM | POA: Diagnosis not present

## 2017-07-02 DIAGNOSIS — L270 Generalized skin eruption due to drugs and medicaments taken internally: Secondary | ICD-10-CM | POA: Diagnosis not present

## 2017-07-02 DIAGNOSIS — G40309 Generalized idiopathic epilepsy and epileptic syndromes, not intractable, without status epilepticus: Secondary | ICD-10-CM | POA: Diagnosis not present

## 2017-07-02 DIAGNOSIS — E782 Mixed hyperlipidemia: Secondary | ICD-10-CM | POA: Diagnosis not present

## 2017-07-02 DIAGNOSIS — R7981 Abnormal blood-gas level: Secondary | ICD-10-CM | POA: Diagnosis not present

## 2017-07-02 DIAGNOSIS — E039 Hypothyroidism, unspecified: Secondary | ICD-10-CM | POA: Diagnosis not present

## 2017-07-02 DIAGNOSIS — B182 Chronic viral hepatitis C: Secondary | ICD-10-CM | POA: Diagnosis not present

## 2017-07-21 MED FILL — VIT D2 1.25 MG (50,000 UNIT: 1.25 MG | 84 days supply | Qty: 12 | Fill #1

## 2017-07-21 MED FILL — PROGESTERONE 100 MG CAPSULE: 100 | 30 days supply | Qty: 15 | Fill #9

## 2017-07-21 MED FILL — FOLIC ACID 1 MG TABLET: 1 | 30 days supply | Qty: 30 | Fill #4

## 2017-07-21 MED FILL — BRIVIACT 50 MG TABLET: 50 | 30 days supply | Qty: 60 | Fill #2

## 2017-07-21 MED FILL — CARBAMAZEPINE ER 200 MG TAB: 200 | 30 days supply | Qty: 120 | Fill #6

## 2017-07-21 MED FILL — ROSUVASTATIN CALCIUM 40 MG: 40 | 30 days supply | Qty: 30 | Fill #4

## 2017-07-21 MED FILL — ESTRADIOL 1 MG TABLET: 1 | 30 days supply | Qty: 30 | Fill #9

## 2017-07-21 MED FILL — DEXILANT DR 60 MG CAPSULE: 60 | 30 days supply | Qty: 30 | Fill #2

## 2017-07-21 MED FILL — OLMESARTAN MEDOXOMIL 20 MG: 20 | 30 days supply | Qty: 30 | Fill #6

## 2017-07-21 MED FILL — SYNTHROID 75 MCG TABLET: 75 | 30 days supply | Qty: 30 | Fill #4

## 2017-07-25 ENCOUNTER — Encounter: Payer: Self-pay | Admitting: Cardiology

## 2017-07-25 ENCOUNTER — Ambulatory Visit: Payer: 59 | Admitting: Cardiology

## 2017-07-25 VITALS — BP 126/78 | HR 108 | Ht 59.0 in | Wt 127.0 lb

## 2017-07-25 DIAGNOSIS — R002 Palpitations: Secondary | ICD-10-CM

## 2017-07-25 DIAGNOSIS — R011 Cardiac murmur, unspecified: Secondary | ICD-10-CM

## 2017-07-25 NOTE — Patient Instructions (Signed)
Medication Instructions:  Your physician recommends that you continue on your current medications as directed. Please refer to the Current Medication list given to you today.   Labwork: NONE   Testing/Procedures: Your physician has requested that you have an echocardiogram. Echocardiography is a painless test that uses sound waves to create images of your heart. It provides your doctor with information about the size and shape of your heart and how well your heart's chambers and valves are working. This procedure takes approximately one hour. There are no restrictions for this procedure.  Your physician has recommended that you wear an event monitor. Event monitors are medical devices that record the heart's electrical activity. Doctors most often [place] these monitors to diagnose arrhythmias. Arrhythmias are problems with the speed or rhythm of the heartbeat. The monitor is a small, portable device. You can wear one while you do your normal daily activities. This is usually used to diagnose what is causing palpitations/syncope (passing out).    Follow-Up: Your physician recommends that you schedule a follow-up appointment in: 4-6 Weeks   Any Other Special Instructions Will Be Listed Below (If Applicable).     If you need a refill on your cardiac medications before your next appointment, please call your pharmacy.  Thank you for choosing [place]!

## 2017-07-25 NOTE — Progress Notes (Signed)
Clinical Summary Latoya Bautista is a 47 y.o.female seen as new consult, referred by Dr Nevada Crane for palpitations and heart murmur.    1. Palpitations - symptoms late January. Varies in frequency, at times can be daily.  - feeling of heart racing. No other associated symptoms. At rest or with exertion - episodes can last a few hours.  - no coffee, occasional tea, occasional sodas, no energy drinks, no EtOH - TSH 3.49, free T4 1.28    2. Heart murmur - she reports told a few years about murmur - no significant SOB/DOE, no LE edema   3. History of radiation exposure - history of leukemia, reports radiation treatments in 1980s  Past Medical History:  Diagnosis Date  . Anemia   . Cancer (HCC)    tongue cancer  . Hepatitis C   . Hepatitis C 04/11/2011  . Leukemia ([place])   . Liver cirrhosis ([place]) 04/11/2011  . Osteoporosis   . Seizures ([place])   . Shingles   . Thrombocytopenia ([place]) 04/11/2011  . Thyroid disease      Allergies  Allergen Reactions  . Simvastatin Rash  . Asparaginase Derivatives Rash  . Elspar [Asparaginase] Rash  . Soap Rash    Procedure prep soap     Current Outpatient Medications  Medication Sig Dispense Refill  . acetaZOLAMIDE (DIAMOX) 250 MG tablet Takes 1 in the morning, 2 at night. (Patient taking differently: Take 250 mg by mouth 2 (two) times daily. ) 90 tablet 3  . carbamazepine (TEGRETOL XR) 200 MG 12 hr tablet Take 200 mg by mouth 2 (two) times daily.     . cholecalciferol (VITAMIN D) 1000 units tablet Take 1,000 Units by mouth daily.    Marland Kitchen estradiol (ESTRACE) 1 MG tablet TAKE 1 TABLET BY MOUTH DAILY. 30 tablet 12  . fexofenadine (ALLEGRA) 180 MG tablet Take 180 mg by mouth daily as needed. allergies    . folic acid (FOLVITE) 1 MG tablet TAKE 1 TABLET BY MOUTH DAILY. 30 tablet 11  . Garlic 2111 MG CAPS Take 1 capsule by mouth daily.    Marland Kitchen levothyroxine (SYNTHROID, LEVOTHROID) 75 MCG tablet Take 75 mcg by mouth daily before breakfast.     .  Multiple Vitamins-Minerals (CENTRUM PO) Take 1 tablet by mouth daily.      Marland Kitchen olmesartan (BENICAR) 20 MG tablet Take 20 mg by mouth daily.    . pantoprazole (PROTONIX) 40 MG tablet Take 40 mg by mouth daily.    . progesterone (PROMETRIUM) 100 MG capsule TAKE 1 CAPSULE BY MOUTH DAILY FOR 15 DAYS PER MONTH 15 capsule 11  . rosuvastatin (CRESTOR) 40 MG tablet Take 40 mg by mouth every evening.     . sodium bicarbonate 325 MG tablet Take 325 mg by mouth 2 (two) times daily.    . vitamin B-12 (CYANOCOBALAMIN) 1000 MCG tablet Take 1,000 mcg by mouth daily.    . vitamin C (ASCORBIC ACID) 500 MG tablet Take 500 mg by mouth daily.     . zoledronic acid (RECLAST) 5 MG/100ML SOLN Inject 5 mg into the vein once. Once yearly     No current facility-administered medications for this visit.      Past Surgical History:  Procedure Laterality Date  . BONE MARROW BIOPSY  05/2011  . BONE MARROW TRANSPLANT    . CHOLECYSTECTOMY    . liver biopsie    . TONGUE BIOPSY    . tongue cancer     surgical removal of  area     Allergies  Allergen Reactions  . Simvastatin Rash  . Asparaginase Derivatives Rash  . Elspar [Asparaginase] Rash  . Soap Rash    Procedure prep soap      Family History  Problem Relation Age of Onset  . Hypertension Father   . Diabetes Maternal Uncle      Social History Latoya Bautista reports that she has never smoked. She has never used smokeless tobacco. Latoya Bautista reports that she does not drink alcohol.   Review of Systems CONSTITUTIONAL: No weight loss, fever, chills, weakness or fatigue.  HEENT: Eyes: No visual loss, blurred vision, double vision or yellow sclerae.No hearing loss, sneezing, congestion, runny nose or sore throat.  SKIN: No rash or itching.  CARDIOVASCULAR: per hpi RESPIRATORY: No shortness of breath, cough or sputum.  GASTROINTESTINAL: No anorexia, nausea, vomiting or diarrhea. No abdominal pain or blood.  GENITOURINARY: No burning on urination, no  polyuria NEUROLOGICAL: No headache, dizziness, syncope, paralysis, ataxia, numbness or tingling in the extremities. No change in bowel or bladder control.  MUSCULOSKELETAL: No muscle, back pain, joint pain or stiffness.  LYMPHATICS: No enlarged nodes. No history of splenectomy.  PSYCHIATRIC: No history of depression or anxiety.  ENDOCRINOLOGIC: No reports of sweating, cold or heat intolerance. No polyuria or polydipsia.  Marland Kitchen   Physical Examination Vitals:   07/25/17 0849 07/25/17 0852  BP: 120/84 126/78  Pulse: (!) 108   SpO2: 98%    Vitals:   07/25/17 0849  Weight: 127 lb (57.6 kg)  Height: '4\' 11"'$  (1.499 m)    Gen: resting comfortably, no acute distress HEENT: no scleral icterus, pupils equal round and reactive, no palptable cervical adenopathy,  CV: RRR, 2/6 systolic murmur at apex, no jvd Resp: Clear to auscultation bilaterally GI: abdomen is soft, non-tender, non-distended, normal bowel sounds, no hepatosplenomegaly MSK: extremities are warm, no edema.  Skin: warm, no rash Neuro:  no focal deficits Psych: appropriate affect     Assessment and Plan  1. Palpitations - baselien EKG shows sinus tach - we will obtain a 2 week event monitor  2. Heart murmur - we will obtain an echo  F/u 4-6 weeks.       Arnoldo Lenis, M.D.,

## 2017-07-28 ENCOUNTER — Encounter: Payer: Self-pay | Admitting: Cardiology

## 2017-07-28 ENCOUNTER — Ambulatory Visit (HOSPITAL_COMMUNITY)
Admission: RE | Admit: 2017-07-28 | Discharge: 2017-07-28 | Disposition: A | Discharge status: Home / Self Care | Payer: 59 | Source: Ambulatory Visit | Attending: Cardiology | Admitting: Cardiology

## 2017-07-28 DIAGNOSIS — R011 Cardiac murmur, unspecified: Secondary | ICD-10-CM | POA: Diagnosis not present

## 2017-07-28 DIAGNOSIS — E079 Disorder of thyroid, unspecified: Secondary | ICD-10-CM | POA: Insufficient documentation

## 2017-07-28 DIAGNOSIS — B1920 Unspecified viral hepatitis C without hepatic coma: Secondary | ICD-10-CM | POA: Insufficient documentation

## 2017-07-28 DIAGNOSIS — Z85810 Personal history of malignant neoplasm of tongue: Secondary | ICD-10-CM | POA: Diagnosis not present

## 2017-07-28 DIAGNOSIS — Z856 Personal history of leukemia: Secondary | ICD-10-CM | POA: Diagnosis not present

## 2017-07-28 MED FILL — ZOLEDRONIC ACID 5 MG/100 ML: 5 | 1 days supply | Qty: 100 | Fill #0

## 2017-07-28 NOTE — Progress Notes (Signed)
*  PRELIMINARY RESULTS* Echocardiogram 2D Echocardiogram has been performed.  Latoya Bautista 07/28/2017, 10:22 AM

## 2017-08-11 ENCOUNTER — Ambulatory Visit (INDEPENDENT_AMBULATORY_CARE_PROVIDER_SITE_OTHER): Payer: 59

## 2017-08-11 DIAGNOSIS — R002 Palpitations: Secondary | ICD-10-CM | POA: Diagnosis not present

## 2017-08-11 DIAGNOSIS — R011 Cardiac murmur, unspecified: Secondary | ICD-10-CM | POA: Diagnosis not present

## 2017-08-20 MED FILL — PROGESTERONE 100 MG CAPSULE: 100 | 30 days supply | Qty: 15 | Fill #10

## 2017-08-20 MED FILL — ROSUVASTATIN CALCIUM 40 MG: 40 | 30 days supply | Qty: 30 | Fill #5

## 2017-08-20 MED FILL — FOLIC ACID 1 MG TABS: 1 | 30 days supply | Qty: 30 | Fill #5

## 2017-08-20 MED FILL — DEXILANT DR 60 MG CAPSULE: 60 | 30 days supply | Qty: 30 | Fill #3

## 2017-08-20 MED FILL — ESTRADIOL 1 MG TABLET: 1 | 30 days supply | Qty: 30 | Fill #10

## 2017-08-20 MED FILL — SYNTHROID 75 MCG TABLET: 75 | 30 days supply | Qty: 30 | Fill #5

## 2017-08-20 MED FILL — CARBAMAZEPINE ER 200 MG TAB: 200 | 30 days supply | Qty: 120 | Fill #7

## 2017-08-25 DIAGNOSIS — Z946 Bone transplant status: Secondary | ICD-10-CM | POA: Diagnosis not present

## 2017-08-25 DIAGNOSIS — C9191 Lymphoid leukemia, unspecified, in remission: Secondary | ICD-10-CM | POA: Diagnosis not present

## 2017-08-25 DIAGNOSIS — K3189 Other diseases of stomach and duodenum: Secondary | ICD-10-CM | POA: Diagnosis not present

## 2017-08-25 DIAGNOSIS — N261 Atrophy of kidney (terminal): Secondary | ICD-10-CM | POA: Diagnosis not present

## 2017-08-25 DIAGNOSIS — Z8619 Personal history of other infectious and parasitic diseases: Secondary | ICD-10-CM | POA: Diagnosis not present

## 2017-08-25 DIAGNOSIS — B182 Chronic viral hepatitis C: Secondary | ICD-10-CM | POA: Diagnosis not present

## 2017-08-25 DIAGNOSIS — K766 Portal hypertension: Secondary | ICD-10-CM | POA: Diagnosis not present

## 2017-08-25 DIAGNOSIS — K7460 Unspecified cirrhosis of liver: Secondary | ICD-10-CM | POA: Diagnosis not present

## 2017-08-25 DIAGNOSIS — K740 Hepatic fibrosis: Secondary | ICD-10-CM | POA: Diagnosis not present

## 2017-08-25 DIAGNOSIS — G40009 Localization-related (focal) (partial) idiopathic epilepsy and epileptic syndromes with seizures of localized onset, not intractable, without status epilepticus: Secondary | ICD-10-CM | POA: Diagnosis not present

## 2017-08-25 DIAGNOSIS — Z13820 Encounter for screening for osteoporosis: Secondary | ICD-10-CM | POA: Diagnosis not present

## 2017-08-25 MED FILL — OLMESARTAN MEDOXOMIL 20 MG: 20 | 30 days supply | Qty: 30 | Fill #0

## 2017-08-28 DIAGNOSIS — G40A09 Absence epileptic syndrome, not intractable, without status epilepticus: Secondary | ICD-10-CM | POA: Diagnosis not present

## 2017-08-28 DIAGNOSIS — E871 Hypo-osmolality and hyponatremia: Secondary | ICD-10-CM | POA: Diagnosis not present

## 2017-08-28 DIAGNOSIS — G40309 Generalized idiopathic epilepsy and epileptic syndromes, not intractable, without status epilepticus: Secondary | ICD-10-CM | POA: Diagnosis not present

## 2017-08-28 DIAGNOSIS — Z6824 Body mass index (BMI) 24.0-24.9, adult: Secondary | ICD-10-CM | POA: Diagnosis not present

## 2017-08-28 DIAGNOSIS — L659 Nonscarring hair loss, unspecified: Secondary | ICD-10-CM | POA: Diagnosis not present

## 2017-08-28 DIAGNOSIS — R002 Palpitations: Secondary | ICD-10-CM | POA: Diagnosis not present

## 2017-08-28 DIAGNOSIS — E782 Mixed hyperlipidemia: Secondary | ICD-10-CM | POA: Diagnosis not present

## 2017-08-28 DIAGNOSIS — Z85828 Personal history of other malignant neoplasm of skin: Secondary | ICD-10-CM | POA: Diagnosis not present

## 2017-08-28 DIAGNOSIS — B182 Chronic viral hepatitis C: Secondary | ICD-10-CM | POA: Diagnosis not present

## 2017-08-28 DIAGNOSIS — L270 Generalized skin eruption due to drugs and medicaments taken internally: Secondary | ICD-10-CM | POA: Diagnosis not present

## 2017-08-28 DIAGNOSIS — R7981 Abnormal blood-gas level: Secondary | ICD-10-CM | POA: Diagnosis not present

## 2017-09-04 DIAGNOSIS — B182 Chronic viral hepatitis C: Secondary | ICD-10-CM | POA: Diagnosis not present

## 2017-09-04 DIAGNOSIS — Z85828 Personal history of other malignant neoplasm of skin: Secondary | ICD-10-CM | POA: Diagnosis not present

## 2017-09-04 DIAGNOSIS — L270 Generalized skin eruption due to drugs and medicaments taken internally: Secondary | ICD-10-CM | POA: Diagnosis not present

## 2017-09-04 DIAGNOSIS — E782 Mixed hyperlipidemia: Secondary | ICD-10-CM | POA: Diagnosis not present

## 2017-09-04 DIAGNOSIS — R7981 Abnormal blood-gas level: Secondary | ICD-10-CM | POA: Diagnosis not present

## 2017-09-04 DIAGNOSIS — R002 Palpitations: Secondary | ICD-10-CM | POA: Diagnosis not present

## 2017-09-04 DIAGNOSIS — G40309 Generalized idiopathic epilepsy and epileptic syndromes, not intractable, without status epilepticus: Secondary | ICD-10-CM | POA: Diagnosis not present

## 2017-09-04 DIAGNOSIS — G40A09 Absence epileptic syndrome, not intractable, without status epilepticus: Secondary | ICD-10-CM | POA: Diagnosis not present

## 2017-09-05 ENCOUNTER — Encounter: Payer: Self-pay | Admitting: Student

## 2017-09-05 ENCOUNTER — Ambulatory Visit: Payer: 59 | Admitting: Student

## 2017-09-05 VITALS — BP 138/76 | HR 114 | Ht 59.0 in | Wt 127.0 lb

## 2017-09-05 DIAGNOSIS — E782 Mixed hyperlipidemia: Secondary | ICD-10-CM | POA: Diagnosis not present

## 2017-09-05 DIAGNOSIS — R002 Palpitations: Secondary | ICD-10-CM

## 2017-09-05 DIAGNOSIS — I10 Essential (primary) hypertension: Secondary | ICD-10-CM

## 2017-09-05 NOTE — Progress Notes (Signed)
Cardiology Office Note    Date:  09/05/2017   ID:  Latoya Bautista, DOB 1971/01/06, MRN [id number]  PCP:  [name], MD  Cardiologist: Carlyle Dolly, MD    Chief Complaint  Patient presents with  . Follow-up    6 week visit    History of Present Illness:    Latoya Bautista is a 47 y.o. female with past medical history of HTN, HLD, seizure disorder and palpitations who presents to the office today for 6-week follow-up.  She was last examined by Dr. Harl Bowie as a new patient referral for palpitations and a newly diagnosed heart murmur. Reported having palpitations occurring in 05/2017 during which time she would feel her heart racing at rest or with activity and the episodes would last a few hours. She denied any associated chest discomfort or dyspnea on exertion. It was recommended that a 2-week event monitor and echocardiogram be obtained for further assessment.  Her event monitor has not been officially read but preliminary report shows episodes of sinus tachycardia with occasional PAC's. Echocardiogram showed a preserved EF of 55 to 60% with no regional wall motion abnormalities and grade 1 DD.  In talking with the patient today, she reports that she had been prescribed Briviact by her Neurologist several months ago and that is around the timeframe that her symptoms started. At the time of her recent follow-up 2 weeks ago, this was discontinued and she was switched to a different medication and says that her symptoms have significantly improved since. She still experiences occasional palpitations but says they only last for a few seconds and resolve spontaneously. Denies any associated lightheadedness, dizziness, or presyncope.  No recent chest discomfort, dyspnea on exertion, orthopnea, PND, or lower extremity edema.   Past Medical History:  Diagnosis Date  . Anemia   . Cancer (HCC)    tongue cancer  . H/O echocardiogram    a. 07/2017: echo showing EF of 55-60%, Grade 1 DD,  and no significant valve abnormalities.   . Hepatitis C   . Hepatitis C 04/11/2011  . Leukemia ([place])   . Liver cirrhosis ([place]) 04/11/2011  . Osteoporosis   . Seizures ([place])   . Shingles   . Thrombocytopenia ([place]) 04/11/2011  . Thyroid disease     Past Surgical History:  Procedure Laterality Date  . BONE MARROW BIOPSY  05/2011  . BONE MARROW TRANSPLANT    . CHOLECYSTECTOMY    . liver biopsie    . TONGUE BIOPSY    . tongue cancer     surgical removal of area    Current Medications: Outpatient Medications Prior to Visit  Medication Sig Dispense Refill  . acetaZOLAMIDE (DIAMOX) 250 MG tablet Takes 1 in the morning, 2 at night. (Patient taking differently: Take 250 mg by mouth 2 (two) times daily. ) 90 tablet 3  . carbamazepine (TEGRETOL XR) 200 MG 12 hr tablet Take 200 mg by mouth 2 (two) times daily.     . cholecalciferol (VITAMIN D) 1000 units tablet Take 1,000 Units by mouth daily.    . Ergocalciferol (VITAMIN D2 PO) Take 1.25 mg by mouth.    . estradiol (ESTRACE) 1 MG tablet TAKE 1 TABLET BY MOUTH DAILY. 30 tablet 12  . fexofenadine (ALLEGRA) 180 MG tablet Take 180 mg by mouth daily as needed. allergies    . folic acid (FOLVITE) 1 MG tablet TAKE 1 TABLET BY MOUTH DAILY. 30 tablet 11  . Garlic 7616 MG CAPS Take 1 capsule  by mouth daily.    Marland Kitchen levothyroxine (SYNTHROID, LEVOTHROID) 75 MCG tablet Take 75 mcg by mouth daily before breakfast.     . Multiple Vitamins-Minerals (CENTRUM PO) Take 1 tablet by mouth daily.      Marland Kitchen olmesartan (BENICAR) 20 MG tablet Take 20 mg by mouth daily.    . pantoprazole (PROTONIX) 40 MG tablet Take 40 mg by mouth daily.    . progesterone (PROMETRIUM) 100 MG capsule TAKE 1 CAPSULE BY MOUTH DAILY FOR 15 DAYS PER MONTH 15 capsule 11  . rosuvastatin (CRESTOR) 40 MG tablet Take 40 mg by mouth every evening.     . sodium bicarbonate 325 MG tablet Take 325 mg by mouth 2 (two) times daily.    . vitamin B-12 (CYANOCOBALAMIN) 1000 MCG tablet Take 1,000 mcg by mouth  daily.    . vitamin C (ASCORBIC ACID) 500 MG tablet Take 500 mg by mouth daily.     . zoledronic acid (RECLAST) 5 MG/100ML SOLN Inject 5 mg into the vein once. Once yearly     No facility-administered medications prior to visit.      Allergies:   Simvastatin; Briviact [brivaracetam]; Keppra [levetiracetam]; Asparaginase derivatives; Elspar [asparaginase]; and Soap   Social History   Socioeconomic History  . Marital status: Married    Spouse name: Shanon Brow  . Number of children: 0  . Years of education: 12th  . Highest education level: Not on file  Occupational History    Employer: [employer]  . Financial resource strain: Not on file  . Food insecurity:    Worry: Not on file    Inability: Not on file  . Transportation needs:    Medical: Not on file    Non-medical: Not on file  Tobacco Use  . Smoking status: Never Smoker  . Smokeless tobacco: Never Used  Substance and Sexual Activity  . Alcohol use: No  . Drug use: No  . Sexual activity: Never    Birth control/protection: None, Post-menopausal  Lifestyle  . Physical activity:    Days per week: Not on file    Minutes per session: Not on file  . Stress: Not on file  Relationships  . Social connections:    Talks on phone: Not on file    Gets together: Not on file    Attends religious service: Not on file    Active member of club or organization: Not on file    Attends meetings of clubs or organizations: Not on file    Relationship status: Not on file  Other Topics Concern  . Not on file  Social History Narrative   Patient lives at home with her spouse.   Caffeine Use: 16oz bottle of soda daily     Family History:  The patient's family history includes Diabetes in her maternal uncle; Hypertension in her father.   Review of Systems:   Please see the history of present illness.     General:  No chills, fever, night sweats or weight changes.  Cardiovascular:  No chest pain, dyspnea on  exertion, edema, orthopnea, paroxysmal nocturnal dyspnea. Positive for palpitations.  Dermatological: No rash, lesions/masses Respiratory: No cough, dyspnea Urologic: No hematuria, dysuria Abdominal:   No nausea, vomiting, diarrhea, bright red blood per rectum, melena, or hematemesis Neurologic:  No visual changes, wkns, changes in mental status.  All other systems reviewed and are otherwise negative except as noted above.   Physical Exam:    VS:  BP 138/76  Pulse (!) 114   Ht '4\' 11"'$  (1.499 m)   Wt 127 lb (57.6 kg)   SpO2 98%   BMI 25.65 kg/m    General: Well developed, well nourished Caucasian female appearing in no acute distress. Head: Normocephalic, atraumatic, sclera non-icteric, no xanthomas, nares are without discharge.  Neck: No carotid bruits. JVD not elevated.  Lungs: Respirations regular and unlabored, without wheezes or rales.  Heart: Regular rate and rhythm. No S3 or S4.  No murmur, no rubs, or gallops appreciated. Abdomen: Soft, non-tender, non-distended with normoactive bowel sounds. No hepatomegaly. No rebound/guarding. No obvious abdominal masses. Msk:  Strength and tone appear normal for age. No joint deformities or effusions. Extremities: No clubbing or cyanosis. No lower extremity edema.  Distal pedal pulses are 2+ bilaterally. Neuro: Alert and oriented X 3. Moves all extremities spontaneously. No focal deficits noted. Psych:  Responds to questions appropriately with a normal affect. Skin: No rashes or lesions noted  Wt Readings from Last 3 Encounters:  09/05/17 127 lb (57.6 kg)  07/25/17 127 lb (57.6 kg)  03/17/17 121 lb (54.9 kg)     Studies/Labs Reviewed:   EKG:  EKG is not ordered today.    Recent Labs: 03/17/2017: Hemoglobin 12.3; Platelets 193   Lipid Panel No results found for: CHOL, TRIG, HDL, CHOLHDL, VLDL, LDLCALC, LDLDIRECT  Additional studies/ records that were reviewed today include:   Echocardiogram: 07/28/2017 Study  Conclusions  - Left ventricle: The cavity size was normal. Wall thickness was   normal. Systolic function was normal. The estimated ejection   fraction was in the range of 55% to 60%. Wall motion was normal;   there were no regional wall motion abnormalities. Doppler   parameters are consistent with abnormal left ventricular   relaxation (grade 1 diastolic dysfunction). - Aortic valve: Valve area (VTI): 2.1 cm^2. Valve area (Vmax): 2.43   cm^2. Valve area (Vmean): 2.5 cm^2. - Technically adequate study.  Assessment:    1. Heart palpitations   2. Essential hypertension   3. Mixed hyperlipidemia      Plan:   In order of problems listed above:  1. Palpitations - Recently evaluated for new onset palpitations for which an echocardiogram was ordered and showed a preserved EF of 55 to 60% with no regional wall motion abnormalities. Preliminary report of event monitor is reviewed which shows episodes of sinus tachycardia with occasional PAC's. She had been started on Briviact by her Neurologist around the time her symptoms started and since discontinuing the medication 2 weeks ago, she reports significant improvement in her symptoms. Also developed hyperkalemia with the medication and this has since resolved by her report as labs were checked by her PCP earlier this week. - She still has episodes of inappropriate sinus tachycardia as heart rate is in the low 100's during today's visit. We reviewed options including starting beta-blocker therapy. She wishes to remain off of medications at this time.  I informed her to call back to let us know if symptoms represent, as we could start Toprol-XL 12.'5mg'$  daily and titrate as needed.    2. HTN - BP is well controlled at 138/76 during today's visit. - continue Olmesartan '20mg'$  daily.   3. HLD - followed by PCP. Remains on Crestor '40mg'$  daily.    Medication Adjustments/Labs and Tests Ordered: Current medicines are reviewed at length with the  patient today.  Concerns regarding medicines are outlined above.  Medication changes, Labs and Tests ordered today are listed in the Patient Instructions  below. Patient Instructions  Medication Instructions:  Your physician recommends that you continue on your current medications as directed. Please refer to the Current Medication list given to you today.   Labwork: NONE   Testing/Procedures: NONE   Follow-Up: Your physician wants you to follow-up in: 6 Months with Dr. Harl Bowie. You will receive a reminder letter in the mail two months in advance. If you don't receive a letter, please call our office to schedule the follow-up appointment.  Any Other Special Instructions Will Be Listed Below (If Applicable).  If you need a refill on your cardiac medications before your next appointment, please call your pharmacy. Thank you for choosing [facility]!    Signed, Erma Heritage, PA-C  09/05/2017 3:45 PM     [facility] [address] [place], [place] [address] Phone: [phone]

## 2017-09-05 NOTE — Patient Instructions (Signed)
Medication Instructions:  Your physician recommends that you continue on your current medications as directed. Please refer to the Current Medication list given to you today.   Labwork: NONE   Testing/Procedures: NONE  Follow-Up: Your physician wants you to follow-up in: 6 Months with Dr. Branch. You will receive a reminder letter in the mail two months in advance. If you don't receive a letter, please call our office to schedule the follow-up appointment.   Any Other Special Instructions Will Be Listed Below (If Applicable).     If you need a refill on your cardiac medications before your next appointment, please call your pharmacy. Thank you for choosing [facility]!    

## 2017-09-11 DIAGNOSIS — D471 Chronic myeloproliferative disease: Secondary | ICD-10-CM | POA: Diagnosis not present

## 2017-09-11 DIAGNOSIS — E559 Vitamin D deficiency, unspecified: Secondary | ICD-10-CM | POA: Diagnosis not present

## 2017-09-11 DIAGNOSIS — F411 Generalized anxiety disorder: Secondary | ICD-10-CM | POA: Diagnosis not present

## 2017-09-11 DIAGNOSIS — E038 Other specified hypothyroidism: Secondary | ICD-10-CM | POA: Diagnosis not present

## 2017-09-11 DIAGNOSIS — E7800 Pure hypercholesterolemia, unspecified: Secondary | ICD-10-CM | POA: Diagnosis not present

## 2017-09-11 DIAGNOSIS — M810 Age-related osteoporosis without current pathological fracture: Secondary | ICD-10-CM | POA: Diagnosis not present

## 2017-09-11 DIAGNOSIS — K739 Chronic hepatitis, unspecified: Secondary | ICD-10-CM | POA: Diagnosis not present

## 2017-09-18 DIAGNOSIS — K739 Chronic hepatitis, unspecified: Secondary | ICD-10-CM | POA: Diagnosis not present

## 2017-09-18 DIAGNOSIS — G40201 Localization-related (focal) (partial) symptomatic epilepsy and epileptic syndromes with complex partial seizures, not intractable, with status epilepticus: Secondary | ICD-10-CM | POA: Diagnosis not present

## 2017-09-18 DIAGNOSIS — F411 Generalized anxiety disorder: Secondary | ICD-10-CM | POA: Diagnosis not present

## 2017-09-18 DIAGNOSIS — E559 Vitamin D deficiency, unspecified: Secondary | ICD-10-CM | POA: Diagnosis not present

## 2017-09-18 DIAGNOSIS — E7800 Pure hypercholesterolemia, unspecified: Secondary | ICD-10-CM | POA: Diagnosis not present

## 2017-09-18 DIAGNOSIS — D471 Chronic myeloproliferative disease: Secondary | ICD-10-CM | POA: Diagnosis not present

## 2017-09-18 DIAGNOSIS — E038 Other specified hypothyroidism: Secondary | ICD-10-CM | POA: Diagnosis not present

## 2017-09-18 DIAGNOSIS — M810 Age-related osteoporosis without current pathological fracture: Secondary | ICD-10-CM | POA: Diagnosis not present

## 2017-09-18 MED FILL — DEXILANT DR 60 MG CAPSULE: 60 | 30 days supply | Qty: 30 | Fill #4

## 2017-09-18 MED FILL — FOLIC ACID 1 MG TABS: 1 | 30 days supply | Qty: 30 | Fill #6

## 2017-09-18 MED FILL — SYNTHROID 75 MCG TABLET: 75 | 30 days supply | Qty: 30 | Fill #0

## 2017-09-18 MED FILL — ROSUVASTATIN CALCIUM 40 MG: 40 | 30 days supply | Qty: 30 | Fill #0

## 2017-09-18 MED FILL — PROGESTERONE 100 MG CAPSULE: 100 | 30 days supply | Qty: 15 | Fill #11

## 2017-09-18 MED FILL — ESTRADIOL 1 MG TABLET: 1 | 30 days supply | Qty: 30 | Fill #11

## 2017-09-18 MED FILL — CARBAMAZEPINE ER 200 MG TAB: 200 | 30 days supply | Qty: 120 | Fill #8

## 2017-09-18 MED FILL — OLMESARTAN MEDOXOMIL 20 MG: 20 | 30 days supply | Qty: 30 | Fill #1

## 2017-09-19 ENCOUNTER — Telehealth: Payer: Self-pay | Admitting: *Deleted

## 2017-09-19 MED ORDER — METOPROLOL TARTRATE 25 MG PO TABS: 12.5000 mg | ORAL_TABLET | Freq: Two times a day (BID) | ORAL | 3 refills | Status: DC

## 2017-09-19 MED FILL — METOPROLOL TARTRATE 25 MG T: 25 | 30 days supply | Qty: 30 | Fill #0

## 2017-09-19 NOTE — Telephone Encounter (Signed)
-----   Message from Arnoldo Lenis, MD sent at 09/19/2017 12:23 PM EDT ----- Heart monitor shows elevated heart rates at times but no abnormal rhythms. This is not something dangerous, but if she is having lots of feeling of her heart racing or skipping we could start her on lopressor 12.5mg  bid and see if this helps  Zandra Abts MD

## 2017-09-25 DIAGNOSIS — C9191 Lymphoid leukemia, unspecified, in remission: Secondary | ICD-10-CM | POA: Diagnosis not present

## 2017-09-25 DIAGNOSIS — M810 Age-related osteoporosis without current pathological fracture: Secondary | ICD-10-CM | POA: Diagnosis not present

## 2017-09-25 DIAGNOSIS — T887XXA Unspecified adverse effect of drug or medicament, initial encounter: Secondary | ICD-10-CM | POA: Diagnosis not present

## 2017-09-25 DIAGNOSIS — M8580 Other specified disorders of bone density and structure, unspecified site: Secondary | ICD-10-CM | POA: Diagnosis not present

## 2017-09-25 DIAGNOSIS — Z85819 Personal history of malignant neoplasm of unspecified site of lip, oral cavity, and pharynx: Secondary | ICD-10-CM | POA: Diagnosis not present

## 2017-09-25 DIAGNOSIS — K7469 Other cirrhosis of liver: Secondary | ICD-10-CM | POA: Diagnosis not present

## 2017-09-25 DIAGNOSIS — E039 Hypothyroidism, unspecified: Secondary | ICD-10-CM | POA: Diagnosis not present

## 2017-09-25 DIAGNOSIS — D471 Chronic myeloproliferative disease: Secondary | ICD-10-CM | POA: Diagnosis not present

## 2017-09-25 DIAGNOSIS — G40209 Localization-related (focal) (partial) symptomatic epilepsy and epileptic syndromes with complex partial seizures, not intractable, without status epilepticus: Secondary | ICD-10-CM | POA: Diagnosis not present

## 2017-09-25 DIAGNOSIS — K739 Chronic hepatitis, unspecified: Secondary | ICD-10-CM | POA: Diagnosis not present

## 2017-09-25 DIAGNOSIS — E559 Vitamin D deficiency, unspecified: Secondary | ICD-10-CM | POA: Diagnosis not present

## 2017-09-25 DIAGNOSIS — F411 Generalized anxiety disorder: Secondary | ICD-10-CM | POA: Diagnosis not present

## 2017-09-25 DIAGNOSIS — E038 Other specified hypothyroidism: Secondary | ICD-10-CM | POA: Diagnosis not present

## 2017-09-25 DIAGNOSIS — G40201 Localization-related (focal) (partial) symptomatic epilepsy and epileptic syndromes with complex partial seizures, not intractable, with status epilepticus: Secondary | ICD-10-CM | POA: Diagnosis not present

## 2017-09-25 DIAGNOSIS — E7800 Pure hypercholesterolemia, unspecified: Secondary | ICD-10-CM | POA: Diagnosis not present

## 2017-09-25 DIAGNOSIS — G40909 Epilepsy, unspecified, not intractable, without status epilepticus: Secondary | ICD-10-CM | POA: Diagnosis not present

## 2017-09-25 DIAGNOSIS — B1920 Unspecified viral hepatitis C without hepatic coma: Secondary | ICD-10-CM | POA: Diagnosis not present

## 2017-09-25 DIAGNOSIS — E872 Acidosis: Secondary | ICD-10-CM | POA: Diagnosis not present

## 2017-09-25 DIAGNOSIS — L659 Nonscarring hair loss, unspecified: Secondary | ICD-10-CM | POA: Diagnosis not present

## 2017-09-25 MED FILL — CARBAMAZEPINE ER 400 MG TAB: 400 | 30 days supply | Qty: 60 | Fill #0

## 2017-10-03 DIAGNOSIS — G40A09 Absence epileptic syndrome, not intractable, without status epilepticus: Secondary | ICD-10-CM | POA: Diagnosis not present

## 2017-10-03 DIAGNOSIS — L659 Nonscarring hair loss, unspecified: Secondary | ICD-10-CM | POA: Diagnosis not present

## 2017-10-03 DIAGNOSIS — B182 Chronic viral hepatitis C: Secondary | ICD-10-CM | POA: Diagnosis not present

## 2017-10-03 DIAGNOSIS — R002 Palpitations: Secondary | ICD-10-CM | POA: Diagnosis not present

## 2017-10-03 DIAGNOSIS — R7981 Abnormal blood-gas level: Secondary | ICD-10-CM | POA: Diagnosis not present

## 2017-10-03 DIAGNOSIS — Z6824 Body mass index (BMI) 24.0-24.9, adult: Secondary | ICD-10-CM | POA: Diagnosis not present

## 2017-10-03 DIAGNOSIS — E871 Hypo-osmolality and hyponatremia: Secondary | ICD-10-CM | POA: Diagnosis not present

## 2017-10-03 DIAGNOSIS — G40309 Generalized idiopathic epilepsy and epileptic syndromes, not intractable, without status epilepticus: Secondary | ICD-10-CM | POA: Diagnosis not present

## 2017-10-10 DIAGNOSIS — D471 Chronic myeloproliferative disease: Secondary | ICD-10-CM | POA: Diagnosis not present

## 2017-10-10 DIAGNOSIS — K739 Chronic hepatitis, unspecified: Secondary | ICD-10-CM | POA: Diagnosis not present

## 2017-10-10 DIAGNOSIS — N951 Menopausal and female climacteric states: Secondary | ICD-10-CM | POA: Diagnosis not present

## 2017-10-10 DIAGNOSIS — F411 Generalized anxiety disorder: Secondary | ICD-10-CM | POA: Diagnosis not present

## 2017-10-10 DIAGNOSIS — M810 Age-related osteoporosis without current pathological fracture: Secondary | ICD-10-CM | POA: Diagnosis not present

## 2017-10-10 DIAGNOSIS — E7849 Other hyperlipidemia: Secondary | ICD-10-CM | POA: Diagnosis not present

## 2017-10-10 DIAGNOSIS — E559 Vitamin D deficiency, unspecified: Secondary | ICD-10-CM | POA: Diagnosis not present

## 2017-10-10 DIAGNOSIS — E038 Other specified hypothyroidism: Secondary | ICD-10-CM | POA: Diagnosis not present

## 2017-10-10 DIAGNOSIS — G40201 Localization-related (focal) (partial) symptomatic epilepsy and epileptic syndromes with complex partial seizures, not intractable, with status epilepticus: Secondary | ICD-10-CM | POA: Diagnosis not present

## 2017-10-17 ENCOUNTER — Other Ambulatory Visit: Payer: Self-pay | Admitting: Obstetrics and Gynecology

## 2017-10-17 DIAGNOSIS — G4089 Other seizures: Secondary | ICD-10-CM | POA: Diagnosis not present

## 2017-10-17 DIAGNOSIS — E782 Mixed hyperlipidemia: Secondary | ICD-10-CM | POA: Diagnosis not present

## 2017-10-17 DIAGNOSIS — R000 Tachycardia, unspecified: Secondary | ICD-10-CM | POA: Diagnosis not present

## 2017-10-17 DIAGNOSIS — E039 Hypothyroidism, unspecified: Secondary | ICD-10-CM | POA: Diagnosis not present

## 2017-10-17 DIAGNOSIS — R7301 Impaired fasting glucose: Secondary | ICD-10-CM | POA: Diagnosis not present

## 2017-10-17 DIAGNOSIS — R21 Rash and other nonspecific skin eruption: Secondary | ICD-10-CM | POA: Diagnosis not present

## 2017-10-17 DIAGNOSIS — K7460 Unspecified cirrhosis of liver: Secondary | ICD-10-CM | POA: Diagnosis not present

## 2017-10-17 DIAGNOSIS — B182 Chronic viral hepatitis C: Secondary | ICD-10-CM | POA: Diagnosis not present

## 2017-10-17 MED FILL — ESTRADIOL 1 MG TABLET: 1 | 30 days supply | Qty: 30 | Fill #12

## 2017-10-17 MED FILL — OLMESARTAN MEDOXOMIL 20 MG: 20 | 30 days supply | Qty: 30 | Fill #2

## 2017-10-17 MED FILL — DEXILANT DR 60 MG CAPSULE: 60 | 30 days supply | Qty: 30 | Fill #5

## 2017-10-17 MED FILL — VIT D2 1.25 MG (50,000 UNIT: 1.25 MG | 84 days supply | Qty: 12 | Fill #0

## 2017-10-17 MED FILL — FOLIC ACID 1 MG TABS: 1 | 30 days supply | Qty: 30 | Fill #7

## 2017-10-17 MED FILL — SYNTHROID 75 MCG TABLET: 75 | 30 days supply | Qty: 30 | Fill #1

## 2017-10-17 MED FILL — VASCEPA 1 GM CAPSULE: 1 | 90 days supply | Qty: 360 | Fill #0

## 2017-10-17 MED FILL — ROSUVASTATIN CALCIUM 40 MG: 40 | 30 days supply | Qty: 30 | Fill #1

## 2017-10-17 MED FILL — METOPROLOL TARTRATE 25 MG T: 25 | 30 days supply | Qty: 30 | Fill #1

## 2017-10-20 MED FILL — PROGESTERONE 100 MG CAPSULE: 100 | 30 days supply | Qty: 15 | Fill #0

## 2017-11-09 ENCOUNTER — Emergency Department (HOSPITAL_COMMUNITY): Payer: PRIVATE HEALTH INSURANCE

## 2017-11-09 ENCOUNTER — Encounter (HOSPITAL_COMMUNITY): Payer: Self-pay | Admitting: Emergency Medicine

## 2017-11-09 ENCOUNTER — Other Ambulatory Visit: Payer: Self-pay

## 2017-11-09 ENCOUNTER — Emergency Department (HOSPITAL_COMMUNITY)
Admission: EM | Admit: 2017-11-09 | Discharge: 2017-11-09 | Disposition: A | Discharge status: Home / Self Care | Payer: PRIVATE HEALTH INSURANCE | Attending: Emergency Medicine | Admitting: Emergency Medicine

## 2017-11-09 DIAGNOSIS — Y929 Unspecified place or not applicable: Secondary | ICD-10-CM | POA: Diagnosis not present

## 2017-11-09 DIAGNOSIS — S3992XA Unspecified injury of lower back, initial encounter: Secondary | ICD-10-CM | POA: Diagnosis not present

## 2017-11-09 DIAGNOSIS — M542 Cervicalgia: Secondary | ICD-10-CM | POA: Diagnosis not present

## 2017-11-09 DIAGNOSIS — S1093XA Contusion of unspecified part of neck, initial encounter: Secondary | ICD-10-CM

## 2017-11-09 DIAGNOSIS — Y9389 Activity, other specified: Secondary | ICD-10-CM | POA: Insufficient documentation

## 2017-11-09 DIAGNOSIS — Y990 Civilian activity done for income or pay: Secondary | ICD-10-CM | POA: Diagnosis not present

## 2017-11-09 DIAGNOSIS — Z79899 Other long term (current) drug therapy: Secondary | ICD-10-CM | POA: Insufficient documentation

## 2017-11-09 DIAGNOSIS — S161XXA Strain of muscle, fascia and tendon at neck level, initial encounter: Secondary | ICD-10-CM | POA: Diagnosis not present

## 2017-11-09 DIAGNOSIS — S300XXA Contusion of lower back and pelvis, initial encounter: Secondary | ICD-10-CM

## 2017-11-09 DIAGNOSIS — W07XXXA Fall from chair, initial encounter: Secondary | ICD-10-CM | POA: Diagnosis not present

## 2017-11-09 NOTE — ED Provider Notes (Signed)
[facility] EMERGENCY DEPARTMENT Provider Note   CSN: [id number] Arrival date & time: 11/09/17  1734     History   Chief Complaint Chief Complaint  Patient presents with  . Fall    HPI Latoya Bautista is a 47 y.o. female.  Patient is a 47 year old female who presents to the emergency department following injury to her tailbone and neck.  The patient states that while at work a chair on rollers slipped out from under her, she fell, sustained an injury to the neck, and fell on her tailbone.  She has been having pain in both since the incident.  She is able to ambulate, but states that she has pain with getting around, she particularly has pain with change of position.  She presents now for assistance and evaluation with this issue.     Past Medical History:  Diagnosis Date  . Anemia   . Cancer (HCC)    tongue cancer  . H/O echocardiogram    a. 07/2017: echo showing EF of 55-60%, Grade 1 DD, and no significant valve abnormalities.   . Hepatitis C   . Hepatitis C 04/11/2011  . Leukemia ([place])   . Liver cirrhosis ([place]) 04/11/2011  . Osteoporosis   . Seizures ([place])   . Shingles   . Thrombocytopenia ([place]) 04/11/2011  . Thyroid disease     Patient Active Problem List   Diagnosis Date Noted  . Routine gynecological examination 10/18/2013  . Localization-related epilepsy ([place]) 01/01/2013  . Partial epilepsy with impairment of consciousness ([place]) 12/15/2012  . Postmenopausal bleeding DUE TO hormone tx. 10/08/2012  . Hepatitis C 04/11/2011  . Liver cirrhosis ([place]) 04/11/2011  . Thrombocytopenia ([place]) 04/11/2011    Past Surgical History:  Procedure Laterality Date  . BONE MARROW BIOPSY  05/2011  . BONE MARROW TRANSPLANT    . CHOLECYSTECTOMY    . liver biopsie    . TONGUE BIOPSY    . tongue cancer     surgical removal of area     OB History   None      Home Medications    Prior to Admission medications   Medication Sig Start Date End Date Taking? Authorizing  Provider  acetaZOLAMIDE (DIAMOX) 250 MG tablet Takes 1 in the morning, 2 at night. Patient taking differently: Take 250 mg by mouth 2 (two) times daily.  05/26/13   Philmore Pali, NP  carbamazepine (TEGRETOL XR) 200 MG 12 hr tablet Take 200 mg by mouth 2 (two) times daily.     [provider]  cholecalciferol (VITAMIN D) 1000 units tablet Take 1,000 Units by mouth daily.    [provider]  Ergocalciferol (VITAMIN D2 PO) Take 1.25 mg by mouth.    [provider]  estradiol (ESTRACE) 1 MG tablet TAKE 1 TABLET BY MOUTH DAILY. 10/25/16   [name], MD  fexofenadine (ALLEGRA) 180 MG tablet Take 180 mg by mouth daily as needed. allergies 09/20/10   [provider]  folic acid (FOLVITE) 1 MG tablet TAKE 1 TABLET BY MOUTH DAILY. 03/24/17   Holley Bouche, NP  Garlic 1660 MG CAPS Take 1 capsule by mouth daily.    [provider]  levothyroxine (SYNTHROID, LEVOTHROID) 75 MCG tablet Take 75 mcg by mouth daily before breakfast.     [provider]  metoprolol tartrate (LOPRESSOR) 25 MG tablet Take 0.5 tablets (12.5 mg total) by mouth 2 (two) times daily. 09/19/17 12/18/17  Arnoldo Lenis, MD  Multiple Vitamins-Minerals (  CENTRUM PO) Take 1 tablet by mouth daily.      [provider]  olmesartan (BENICAR) 20 MG tablet Take 20 mg by mouth daily.    [provider]  pantoprazole (PROTONIX) 40 MG tablet Take 40 mg by mouth daily.    [provider]  progesterone (PROMETRIUM) 100 MG capsule TAKE 1 CAPSULE BY MOUTH DAILY FOR 15 DAYS PER MONTH 10/17/17   [name], MD  rosuvastatin (CRESTOR) 40 MG tablet Take 40 mg by mouth every evening.     [provider]  sodium bicarbonate 325 MG tablet Take 325 mg by mouth 2 (two) times daily.    [provider]  vitamin B-12 (CYANOCOBALAMIN) 1000 MCG tablet Take 1,000 mcg by mouth daily.    [provider]  vitamin C (ASCORBIC ACID) 500 MG tablet Take 500  mg by mouth daily.  07/18/10   [provider]  zoledronic acid (RECLAST) 5 MG/100ML SOLN Inject 5 mg into the vein once. Once yearly    [provider]    Family History Family History  Problem Relation Age of Onset  . Hypertension Father   . Diabetes Maternal Uncle     Social History Social History   Tobacco Use  . Smoking status: Never Smoker  . Smokeless tobacco: Never Used  Substance Use Topics  . Alcohol use: No  . Drug use: No     Allergies   Simvastatin; Briviact [brivaracetam]; Keppra [levetiracetam]; Asparaginase derivatives; Elspar [asparaginase]; and Soap   Review of Systems Review of Systems  Constitutional: Negative for activity change.       All ROS Neg except as noted in HPI  HENT: Negative for nosebleeds.   Eyes: Negative for photophobia and discharge.  Respiratory: Negative for cough, shortness of breath and wheezing.   Cardiovascular: Negative for chest pain and palpitations.  Gastrointestinal: Negative for abdominal pain and blood in stool.  Genitourinary: Negative for dysuria, frequency and hematuria.  Musculoskeletal: Positive for back pain and neck pain. Negative for arthralgias.  Skin: Negative.   Neurological: Negative for dizziness, seizures and speech difficulty.  Psychiatric/Behavioral: Negative for confusion and hallucinations.     Physical Exam Updated Vital Signs BP (!) 142/84 (BP Location: Right Arm)   Pulse (!) 108   Temp 98.4 F (36.9 C) (Oral)   Resp 18   Ht '4\' 11"'$  (1.499 m)   Wt 56.7 kg (125 lb)   SpO2 98%   BMI 25.25 kg/m   Physical Exam  Constitutional: She is oriented to person, place, and time. She appears well-developed and well-nourished.  Non-toxic appearance.  HENT:  Head: Normocephalic.  Right Ear: Tympanic membrane and external ear normal.  Left Ear: Tympanic membrane and external ear normal.  Eyes: Pupils are equal, round, and reactive to light. EOM and lids are normal.  Neck: Normal range  of motion. Neck supple. Carotid bruit is not present.  Cardiovascular: Normal rate, regular rhythm, normal heart sounds, intact distal pulses and normal pulses.  Pulmonary/Chest: Breath sounds normal. No respiratory distress.  Abdominal: Soft. Bowel sounds are normal. There is no tenderness. There is no guarding.  Musculoskeletal: Normal range of motion.  There is soreness to palpation of the neck, there is paraspinal area tenderness in the cervical spine area.  No palpable deformity of the cervical spine thoracic spine or lumbar spine.  There is pain in the coccyx area.  There is full range of motion of right and left hips.  Lymphadenopathy:  Head (right side): No submandibular adenopathy present.       Head (left side): No submandibular adenopathy present.    She has no cervical adenopathy.  Neurological: She is alert and oriented to person, place, and time. She has normal strength. No cranial nerve deficit or sensory deficit.  Skin: Skin is warm and dry.  Psychiatric: She has a normal mood and affect. Her speech is normal.  Nursing note and vitals reviewed.    ED Treatments / Results  Labs (all labs ordered are listed, but only abnormal results are displayed) Labs Reviewed - No data to display  EKG None  Radiology Dg Cervical Spine Complete  Result Date: 11/09/2017 CLINICAL DATA:  Posttraumatic neck pain. EXAM: CERVICAL SPINE - COMPLETE 4+ VIEW COMPARISON:  None. FINDINGS: Oblique lateral radiograph. No evidence of fracture. No malalignment. No prevertebral thickening. Cervical carotid atherosclerotic calcification. IMPRESSION: Negative cervical spine radiographs. Electronically Signed   By: Monte Fantasia M.D.   On: 11/09/2017 20:28   Dg Sacrum/coccyx  Result Date: 11/09/2017 CLINICAL DATA:  Chair rolled from under patient, coccygeal injury EXAM: SACRUM AND COCCYX - 2+ VIEW COMPARISON:  Pelvic radiograph 09/21/2012, CT abdomen and pelvis 12/17/2009 FINDINGS: SI joints  symmetric. Sacral foramina grossly symmetric. No definite sacrococcygeal fracture identified. IMPRESSION: No acute sacrococcygeal abnormalities. Electronically Signed   By: Lavonia Dana M.D.   On: 11/09/2017 20:28    Procedures Procedures (including critical care time)  Medications Ordered in ED Medications - No data to display   Initial Impression / Assessment and Plan / ED Course  I have reviewed the triage vital signs and the nursing notes.  Pertinent labs & imaging results that were available during my care of the patient were reviewed by me and considered in my medical decision making (see chart for details).       Final Clinical Impressions(s) / ED Diagnoses MDM Vital signs within normal limits.  The x-ray of the cervical spine and the x-ray of the coccyx above negative for fracture or dislocation.  I have informed the patient of the findings.  The neurologic examination shows no gross neurologic deficits appreciated.  There is no evidence for cauda equina or other emergent changes.  The patient states she would like to use Tylenol and/or ibuprofen for soreness.  I have asked the patient to use a pillow when sitting and to perhaps use an ice pack tonight.  The patient will return to the emergency department if any changes in condition, problems, or concerns.   Final diagnoses:  Coccyx contusion, initial encounter    ED Discharge Orders    None       Lily Kocher, Hershal Coria 11/09/17 2144    Nat Christen, MD 11/10/17 1122

## 2017-11-09 NOTE — ED Triage Notes (Signed)
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 She fell on her butt and hit her neck on the chair.

## 2017-11-09 NOTE — Discharge Instructions (Addendum)
For your x-rays are negative for fracture or dislocation.  Use of a pillow when sitting until the bruise to your coccyx/tailbone is better may be helpful.  Please use an ice pack tonight.  Use Tylenol every 4 hours, or ibuprofen every 6 hours as needed for pain and soreness.  Please see your primary physician, or return to the emergency department if any changes in your condition, problems, or concerns.

## 2017-11-11 DIAGNOSIS — E559 Vitamin D deficiency, unspecified: Secondary | ICD-10-CM | POA: Diagnosis not present

## 2017-11-11 DIAGNOSIS — K739 Chronic hepatitis, unspecified: Secondary | ICD-10-CM | POA: Diagnosis not present

## 2017-11-11 DIAGNOSIS — D471 Chronic myeloproliferative disease: Secondary | ICD-10-CM | POA: Diagnosis not present

## 2017-11-11 DIAGNOSIS — E038 Other specified hypothyroidism: Secondary | ICD-10-CM | POA: Diagnosis not present

## 2017-11-11 DIAGNOSIS — F411 Generalized anxiety disorder: Secondary | ICD-10-CM | POA: Diagnosis not present

## 2017-11-11 DIAGNOSIS — G40201 Localization-related (focal) (partial) symptomatic epilepsy and epileptic syndromes with complex partial seizures, not intractable, with status epilepticus: Secondary | ICD-10-CM | POA: Diagnosis not present

## 2017-11-11 DIAGNOSIS — M810 Age-related osteoporosis without current pathological fracture: Secondary | ICD-10-CM | POA: Diagnosis not present

## 2017-11-14 ENCOUNTER — Other Ambulatory Visit: Payer: Self-pay | Admitting: Obstetrics and Gynecology

## 2017-11-14 MED FILL — SYNTHROID 75 MCG TABLET: 75 | 30 days supply | Qty: 30 | Fill #2

## 2017-11-14 MED FILL — FOLIC ACID 1 MG TABS: 1 | 30 days supply | Qty: 30 | Fill #8

## 2017-11-14 MED FILL — CARBAMAZEPINE ER 400 MG TAB: 400 | 30 days supply | Qty: 60 | Fill #1

## 2017-11-14 MED FILL — METOPROLOL TARTRATE 25 MG T: 25 | 30 days supply | Qty: 30 | Fill #2

## 2017-11-14 MED FILL — PROGESTERONE 100 MG CAPSULE: 100 | 30 days supply | Qty: 15 | Fill #1

## 2017-11-14 MED FILL — DEXILANT DR 60 MG CAPSULE: 60 | 30 days supply | Qty: 30 | Fill #0

## 2017-11-14 MED FILL — ROSUVASTATIN CALCIUM 40 MG: 40 | 30 days supply | Qty: 30 | Fill #2

## 2017-11-16 ENCOUNTER — Other Ambulatory Visit: Payer: Self-pay | Admitting: Obstetrics and Gynecology

## 2017-11-17 MED FILL — ESTRADIOL 1 MG TABLET: 1 | 30 days supply | Qty: 30 | Fill #0

## 2017-11-20 DIAGNOSIS — K739 Chronic hepatitis, unspecified: Secondary | ICD-10-CM | POA: Diagnosis not present

## 2017-11-20 DIAGNOSIS — E038 Other specified hypothyroidism: Secondary | ICD-10-CM | POA: Diagnosis not present

## 2017-11-20 DIAGNOSIS — E559 Vitamin D deficiency, unspecified: Secondary | ICD-10-CM | POA: Diagnosis not present

## 2017-11-20 DIAGNOSIS — M810 Age-related osteoporosis without current pathological fracture: Secondary | ICD-10-CM | POA: Diagnosis not present

## 2017-11-20 DIAGNOSIS — D471 Chronic myeloproliferative disease: Secondary | ICD-10-CM | POA: Diagnosis not present

## 2017-11-20 DIAGNOSIS — G40201 Localization-related (focal) (partial) symptomatic epilepsy and epileptic syndromes with complex partial seizures, not intractable, with status epilepticus: Secondary | ICD-10-CM | POA: Diagnosis not present

## 2017-11-20 DIAGNOSIS — E7800 Pure hypercholesterolemia, unspecified: Secondary | ICD-10-CM | POA: Diagnosis not present

## 2017-11-20 DIAGNOSIS — F411 Generalized anxiety disorder: Secondary | ICD-10-CM | POA: Diagnosis not present

## 2017-12-15 MED FILL — OLMESARTAN MEDOXOMIL 20 MG: 20 | 30 days supply | Qty: 30 | Fill #3

## 2017-12-15 MED FILL — ROSUVASTATIN CALCIUM 40 MG: 40 | 30 days supply | Qty: 30 | Fill #3

## 2017-12-15 MED FILL — DEXILANT DR 60 MG CAPSULE: 60 | 30 days supply | Qty: 30 | Fill #1

## 2017-12-15 MED FILL — FOLIC ACID 1 MG TABS: 1 | 30 days supply | Qty: 30 | Fill #9

## 2017-12-15 MED FILL — METOPROLOL TARTRATE 25 MG T: 25 | 30 days supply | Qty: 30 | Fill #3

## 2017-12-15 MED FILL — CARBAMAZEPINE ER 400 MG TAB: 400 | 30 days supply | Qty: 60 | Fill #2

## 2017-12-15 MED FILL — PROGESTERONE 100 MG CAPSULE: 100 | 30 days supply | Qty: 15 | Fill #2

## 2017-12-15 MED FILL — ESTRADIOL 1 MG TABLET: 1 | 30 days supply | Qty: 30 | Fill #1

## 2017-12-15 MED FILL — SYNTHROID 75 MCG TABLET: 75 | 30 days supply | Qty: 30 | Fill #3

## 2018-01-12 MED FILL — DEXILANT DR 60 MG CAPSULE: 60 | 30 days supply | Qty: 30 | Fill #2

## 2018-01-12 MED FILL — FOLIC ACID 1 MG TABS: 1 | 30 days supply | Qty: 30 | Fill #10

## 2018-01-12 MED FILL — VIT D2 1.25 MG (50,000 UNIT: 1.25 MG | 84 days supply | Qty: 12 | Fill #1

## 2018-01-12 MED FILL — ESTRADIOL 1 MG TABLET: 1 | 30 days supply | Qty: 30 | Fill #2

## 2018-01-12 MED FILL — VASCEPA 1 GM CAPSULE: 1 | 90 days supply | Qty: 360 | Fill #1

## 2018-01-12 MED FILL — METOPROLOL TARTRATE 25 MG T: 25 | 30 days supply | Qty: 30 | Fill #4

## 2018-01-12 MED FILL — SYNTHROID 75 MCG TABLET: 75 | 30 days supply | Qty: 30 | Fill #4

## 2018-01-12 MED FILL — CARBAMAZEPINE ER 400 MG TAB: 400 | 30 days supply | Qty: 60 | Fill #3

## 2018-01-12 MED FILL — OLMESARTAN MEDOXOMIL 20 MG: 20 | 30 days supply | Qty: 30 | Fill #0

## 2018-01-12 MED FILL — ROSUVASTATIN CALCIUM 40 MG: 40 | 30 days supply | Qty: 30 | Fill #4

## 2018-01-12 MED FILL — PROGESTERONE 100 MG CAPSULE: 100 | 30 days supply | Qty: 15 | Fill #3

## 2018-01-16 DIAGNOSIS — I959 Hypotension, unspecified: Secondary | ICD-10-CM | POA: Diagnosis not present

## 2018-01-16 DIAGNOSIS — R000 Tachycardia, unspecified: Secondary | ICD-10-CM | POA: Diagnosis not present

## 2018-01-16 DIAGNOSIS — K7460 Unspecified cirrhosis of liver: Secondary | ICD-10-CM | POA: Diagnosis not present

## 2018-01-16 DIAGNOSIS — G40A09 Absence epileptic syndrome, not intractable, without status epilepticus: Secondary | ICD-10-CM | POA: Diagnosis not present

## 2018-01-16 DIAGNOSIS — G40309 Generalized idiopathic epilepsy and epileptic syndromes, not intractable, without status epilepticus: Secondary | ICD-10-CM | POA: Diagnosis not present

## 2018-01-16 DIAGNOSIS — B182 Chronic viral hepatitis C: Secondary | ICD-10-CM | POA: Diagnosis not present

## 2018-01-16 DIAGNOSIS — E782 Mixed hyperlipidemia: Secondary | ICD-10-CM | POA: Diagnosis not present

## 2018-01-16 DIAGNOSIS — R7301 Impaired fasting glucose: Secondary | ICD-10-CM | POA: Diagnosis not present

## 2018-01-16 DIAGNOSIS — E039 Hypothyroidism, unspecified: Secondary | ICD-10-CM | POA: Diagnosis not present

## 2018-01-16 DIAGNOSIS — R21 Rash and other nonspecific skin eruption: Secondary | ICD-10-CM | POA: Diagnosis not present

## 2018-01-16 DIAGNOSIS — Z6825 Body mass index (BMI) 25.0-25.9, adult: Secondary | ICD-10-CM | POA: Diagnosis not present

## 2018-01-16 DIAGNOSIS — G4089 Other seizures: Secondary | ICD-10-CM | POA: Diagnosis not present

## 2018-01-16 DIAGNOSIS — R002 Palpitations: Secondary | ICD-10-CM | POA: Diagnosis not present

## 2018-02-02 DIAGNOSIS — R569 Unspecified convulsions: Secondary | ICD-10-CM | POA: Diagnosis not present

## 2018-02-02 DIAGNOSIS — G40209 Localization-related (focal) (partial) symptomatic epilepsy and epileptic syndromes with complex partial seizures, not intractable, without status epilepticus: Secondary | ICD-10-CM | POA: Diagnosis not present

## 2018-02-02 DIAGNOSIS — C9101 Acute lymphoblastic leukemia, in remission: Secondary | ICD-10-CM | POA: Diagnosis not present

## 2018-02-02 DIAGNOSIS — E039 Hypothyroidism, unspecified: Secondary | ICD-10-CM | POA: Diagnosis not present

## 2018-02-02 DIAGNOSIS — M8580 Other specified disorders of bone density and structure, unspecified site: Secondary | ICD-10-CM | POA: Diagnosis not present

## 2018-02-02 DIAGNOSIS — Z85810 Personal history of malignant neoplasm of tongue: Secondary | ICD-10-CM | POA: Diagnosis not present

## 2018-02-06 DIAGNOSIS — K1329 Other disturbances of oral epithelium, including tongue: Secondary | ICD-10-CM | POA: Diagnosis not present

## 2018-02-16 MED FILL — CARBAMAZEPINE ER 400 MG TAB: 400 | 30 days supply | Qty: 60 | Fill #4

## 2018-02-16 MED FILL — ROSUVASTATIN CALCIUM 40 MG: 40 | 30 days supply | Qty: 30 | Fill #5

## 2018-02-16 MED FILL — PROGESTERONE 100 MG CAPSULE: 100 | 30 days supply | Qty: 15 | Fill #4

## 2018-02-16 MED FILL — SYNTHROID 75 MCG TABLET: 75 | 30 days supply | Qty: 30 | Fill #5

## 2018-02-16 MED FILL — METOPROLOL TARTRATE 25 MG T: 25 | 30 days supply | Qty: 30 | Fill #5

## 2018-02-16 MED FILL — FOLIC ACID 1 MG TABS: 1 | 30 days supply | Qty: 30 | Fill #11

## 2018-02-16 MED FILL — ESTRADIOL 1 MG TABLET: 1 | 30 days supply | Qty: 30 | Fill #3

## 2018-02-16 MED FILL — DEXILANT DR 60 MG CAPSULE: 60 | 90 days supply | Qty: 90 | Fill #0

## 2018-02-17 DIAGNOSIS — E871 Hypo-osmolality and hyponatremia: Secondary | ICD-10-CM | POA: Diagnosis not present

## 2018-02-23 DIAGNOSIS — E039 Hypothyroidism, unspecified: Secondary | ICD-10-CM | POA: Diagnosis not present

## 2018-02-23 DIAGNOSIS — I10 Essential (primary) hypertension: Secondary | ICD-10-CM | POA: Diagnosis not present

## 2018-02-23 DIAGNOSIS — Z8619 Personal history of other infectious and parasitic diseases: Secondary | ICD-10-CM | POA: Diagnosis not present

## 2018-02-23 DIAGNOSIS — R000 Tachycardia, unspecified: Secondary | ICD-10-CM | POA: Diagnosis not present

## 2018-02-23 DIAGNOSIS — Z9049 Acquired absence of other specified parts of digestive tract: Secondary | ICD-10-CM | POA: Diagnosis not present

## 2018-02-23 DIAGNOSIS — K740 Hepatic fibrosis: Secondary | ICD-10-CM | POA: Diagnosis not present

## 2018-02-23 DIAGNOSIS — Z9481 Bone marrow transplant status: Secondary | ICD-10-CM | POA: Diagnosis not present

## 2018-02-23 DIAGNOSIS — G40909 Epilepsy, unspecified, not intractable, without status epilepticus: Secondary | ICD-10-CM | POA: Diagnosis not present

## 2018-02-23 DIAGNOSIS — B1920 Unspecified viral hepatitis C without hepatic coma: Secondary | ICD-10-CM | POA: Diagnosis not present

## 2018-02-23 DIAGNOSIS — E7800 Pure hypercholesterolemia, unspecified: Secondary | ICD-10-CM | POA: Diagnosis not present

## 2018-02-23 DIAGNOSIS — B182 Chronic viral hepatitis C: Secondary | ICD-10-CM | POA: Diagnosis not present

## 2018-03-16 ENCOUNTER — Other Ambulatory Visit (HOSPITAL_COMMUNITY): Payer: Self-pay | Admitting: *Deleted

## 2018-03-16 ENCOUNTER — Other Ambulatory Visit: Payer: Self-pay | Admitting: Cardiology

## 2018-03-16 DIAGNOSIS — D696 Thrombocytopenia, unspecified: Secondary | ICD-10-CM

## 2018-03-16 MED FILL — ESTRADIOL 1 MG TABLET: 1 | 30 days supply | Qty: 30 | Fill #4

## 2018-03-16 MED FILL — OLMESARTAN MEDOXOMIL 20 MG: 20 | 30 days supply | Qty: 30 | Fill #1

## 2018-03-16 MED FILL — METOPROLOL TARTRATE 25 MG T: 25 | 45 days supply | Qty: 45 | Fill #0

## 2018-03-16 MED FILL — CARBAMAZEPINE ER 400 MG TAB: 400 | 30 days supply | Qty: 60 | Fill #5

## 2018-03-16 MED FILL — PROGESTERONE 100 MG CAPSULE: 100 | 30 days supply | Qty: 15 | Fill #5

## 2018-03-17 ENCOUNTER — Encounter (HOSPITAL_COMMUNITY): Payer: Self-pay | Admitting: Internal Medicine

## 2018-03-17 ENCOUNTER — Inpatient Hospital Stay (HOSPITAL_COMMUNITY): Payer: 59 | Attending: Hematology

## 2018-03-17 ENCOUNTER — Other Ambulatory Visit: Payer: Self-pay

## 2018-03-17 ENCOUNTER — Inpatient Hospital Stay (HOSPITAL_BASED_OUTPATIENT_CLINIC_OR_DEPARTMENT_OTHER): Payer: 59 | Admitting: Internal Medicine

## 2018-03-17 VITALS — BP 135/66 | HR 85 | Temp 98.3°F | Resp 16 | Wt 131.6 lb

## 2018-03-17 DIAGNOSIS — B1920 Unspecified viral hepatitis C without hepatic coma: Secondary | ICD-10-CM | POA: Insufficient documentation

## 2018-03-17 DIAGNOSIS — Z856 Personal history of leukemia: Secondary | ICD-10-CM

## 2018-03-17 DIAGNOSIS — Z1239 Encounter for other screening for malignant neoplasm of breast: Secondary | ICD-10-CM

## 2018-03-17 DIAGNOSIS — Z79899 Other long term (current) drug therapy: Secondary | ICD-10-CM | POA: Diagnosis not present

## 2018-03-17 DIAGNOSIS — D6959 Other secondary thrombocytopenia: Secondary | ICD-10-CM | POA: Diagnosis not present

## 2018-03-17 DIAGNOSIS — Z9481 Bone marrow transplant status: Secondary | ICD-10-CM | POA: Insufficient documentation

## 2018-03-17 DIAGNOSIS — K7460 Unspecified cirrhosis of liver: Secondary | ICD-10-CM

## 2018-03-17 DIAGNOSIS — D696 Thrombocytopenia, unspecified: Secondary | ICD-10-CM

## 2018-03-17 LAB — CBC WITH DIFFERENTIAL/PLATELET
ABS IMMATURE GRANULOCYTES: 0.06 10*3/uL (ref 0.00–0.07)
BASOS ABS: 0.1 10*3/uL (ref 0.0–0.1)
Basophils Relative: 1 %
EOS ABS: 0.1 10*3/uL (ref 0.0–0.5)
Eosinophils Relative: 1 %
HEMATOCRIT: 39.8 % (ref 36.0–46.0)
HEMOGLOBIN: 13.5 g/dL (ref 12.0–15.0)
IMMATURE GRANULOCYTES: 1 %
LYMPHS PCT: 29 %
Lymphs Abs: 2.3 10*3/uL (ref 0.7–4.0)
MCH: 34.1 pg — ABNORMAL HIGH (ref 26.0–34.0)
MCHC: 33.9 g/dL (ref 30.0–36.0)
MCV: 100.5 fL — AB (ref 80.0–100.0)
MONOS PCT: 9 %
Monocytes Absolute: 0.7 10*3/uL (ref 0.1–1.0)
NEUTROS ABS: 4.6 10*3/uL (ref 1.7–7.7)
NRBC: 0 % (ref 0.0–0.2)
Neutrophils Relative %: 59 %
Platelets: 204 10*3/uL (ref 150–400)
RBC: 3.96 MIL/uL (ref 3.87–5.11)
RDW: 12.3 % (ref 11.5–15.5)
WBC: 7.7 10*3/uL (ref 4.0–10.5)

## 2018-03-17 LAB — COMPREHENSIVE METABOLIC PANEL
ALBUMIN: 4.4 g/dL (ref 3.5–5.0)
ALT: 39 U/L (ref 0–44)
AST: 43 U/L — AB (ref 15–41)
Alkaline Phosphatase: 50 U/L (ref 38–126)
Anion gap: 9 (ref 5–15)
BILIRUBIN TOTAL: 0.7 mg/dL (ref 0.3–1.2)
BUN: 10 mg/dL (ref 6–20)
CHLORIDE: 104 mmol/L (ref 98–111)
CO2: 24 mmol/L (ref 22–32)
Calcium: 9.8 mg/dL (ref 8.9–10.3)
Creatinine, Ser: 0.7 mg/dL (ref 0.44–1.00)
GFR calc Af Amer: >60 mL/min (ref >60)
GFR calc non Af Amer: >60 mL/min (ref >60)
GLUCOSE: 106 mg/dL — AB (ref 70–99)
POTASSIUM: 4.2 mmol/L (ref 3.5–5.1)
Sodium: 137 mmol/L (ref 135–145)
Total Protein: 8 g/dL (ref 6.5–8.1)

## 2018-03-17 MED ORDER — FOLIC ACID 1 MG PO TABS: 1.0000 mg | ORAL_TABLET | Freq: Every day | ORAL | 11 refills | Status: DC

## 2018-03-17 MED FILL — FOLIC ACID 1 MG TABS: 1 | 30 days supply | Qty: 30 | Fill #0

## 2018-03-17 NOTE — Patient Instructions (Signed)
[facility]  Discharge Instructions: You saw Dr. Higgs today                               _______________________________________________________________  Thank you for choosing [facility] to provide your oncology and hematology care.  To afford each patient quality time with our providers, please arrive at least 15 minutes before your scheduled appointment.  You need to re-schedule your appointment if you arrive 10 or more minutes late.  We strive to give you quality time with our providers, and arriving late affects you and other patients whose appointments are after yours.  Also, if you no show three or more times for appointments you may be dismissed from the clinic.  Again, thank you for choosing  [facility]. Our hope is that these requests will allow you access to exceptional care and in a timely manner. _______________________________________________________________  If you have questions after your visit, please contact our office at [phone] between the hours of 8:30 a.m. and 5:00 p.m. Voicemails left after 4:30 p.m. will not be returned until the following business day. _______________________________________________________________  For prescription refill requests, have your pharmacy contact our office. _______________________________________________________________  Recommendations made by the consultant and any test results will be sent to your referring physician. _______________________________________________________________ 

## 2018-03-17 NOTE — Progress Notes (Signed)
Diagnosis Thrombocytopenia ([place]) - Plan: CBC with Differential/Platelet, Comprehensive metabolic panel, Lactate dehydrogenase, folic acid (FOLVITE) 1 MG tablet  Cirrhosis of liver without ascites, unspecified hepatic cirrhosis type ([place]) - Plan: folic acid (FOLVITE) 1 MG tablet  Screening breast examination - Plan: MM Digital Screening  Staging Cancer Staging No matching staging information was found for the patient.  Assessment and Plan:  1.  Thrombocytopenia secondary to cirrhosis of liver with H/O Hepatitis C, in remission. She is no longer on her antiviral therapy. She completed 44 weeks of telaprevir triple based therapy for the treatment of her hepatitis C treatment. Completion date was in January 2012. Treatment was discontinued 4 weeks early due to worsening anemia. Her HCV RNA 6 months post treatment remained undetectable confirming SVR. HCV RNA level 11/04/2012 continued to validate SVR (HCV RNA -non-detected).  Her hepatologist is Dr. Lazaro Arms at [facility].  She had been treated previously on two occassions and was a partial responder at best which had been complicated by neutropenia and anemia. She has a history of childhood leukemia (ALL) and underwent two bone marrow transplants (1982 and 1984). She denies any further treatment since last bone marrow transplant.   Labs done 03/17/2018 reviewed and showed WBC 7.7 HB 13.5 plts 204,000.  Chemistries WNL with K+ 4.2 Cr 0.70 and normal LFTs.  Plt count dating back to 2016 shows no evidence of thrombocytopenia.  Pt should continue to follow-up with [facility].   She will RTC in 1 year for follow-up and labs.  She is requesting Rx for folic acid.  Rx sent to pharmacy.  #30 with 11 refills.    2  Health maintenance.  Pt is set up for screening mammogram.    Interval history:  Historical data obtained from note dated 03/17/2017:  47 yr old female previously followed by Dr. Talbert Cage for Thrombocytopenia secondary to cirrhosis of liver with H/O  Hepatitis C, in remission. She is no longer on her antiviral therapy. She completed 44 weeks of telaprevir triple based therapy for the treatment of her hepatitis C treatment. Completion date was in January 2012. Treatment was discontinued 4 weeks early due to worsening anemia. Her HCV RNA 6 months post treatment remained undetectable confirming SVR. HCV RNA level 11/04/2012 continued to validate SVR (HCV RNA -non-detected).  Her hepatologist is Dr. Lazaro Arms at [facility].  She had been treated previously on two occassions and was a partial responder at best which had been complicated by neutropenia and anemia. She has a history of childhood leukemia (ALL) and underwent two bone marrow transplants (1982 and 1984). She denies any further treatment since last bone marrow transplant.   Current Status:  Pt is seen today for follow-up. She is here to go over labs.  When questioned, she has not undergone mammogram.  She is requesting refills on folic acid.   Problem List Patient Active Problem List   Diagnosis Date Noted  . Routine gynecological examination [Z01.419] 10/18/2013  . Localization-related epilepsy ([place]) [Z61.096] 01/01/2013  . Partial epilepsy with impairment of consciousness ([place]) [G40.209] 12/15/2012  . Postmenopausal bleeding DUE TO hormone tx. [N95.0] 10/08/2012  . Hepatitis C [B19.20] 04/11/2011  . Liver cirrhosis ([place]) [K74.60] 04/11/2011  . Thrombocytopenia ([place]) [D69.6] 04/11/2011    Past Medical History Past Medical History:  Diagnosis Date  . Anemia   . Cancer (HCC)    tongue cancer  . H/O echocardiogram    a. 07/2017: echo showing EF of 55-60%, Grade 1 DD, and  no significant valve abnormalities.   . Hepatitis C   . Hepatitis C 04/11/2011  . Leukemia ([place])   . Liver cirrhosis ([place]) 04/11/2011  . Osteoporosis   . Seizures ([place])   . Shingles   . Thrombocytopenia ([place]) 04/11/2011  . Thyroid disease     Past Surgical History Past Surgical History:  Procedure  Laterality Date  . BONE MARROW BIOPSY  05/2011  . BONE MARROW TRANSPLANT    . CHOLECYSTECTOMY    . liver biopsie    . TONGUE BIOPSY    . tongue cancer     surgical removal of area    Family History Family History  Problem Relation Age of Onset  . Hypertension Father   . Diabetes Maternal Uncle      Social History  reports that she has never smoked. She has never used smokeless tobacco. She reports that she does not drink alcohol or use drugs.  Medications  Current Outpatient Medications:  .  carbamazepine (TEGRETOL XR) 400 MG 12 hr tablet, Take 400 mg by mouth daily. , Disp: , Rfl:  .  vitamin E 400 UNIT capsule, Take 400 Units by mouth 2 (two) times daily., Disp: , Rfl:  .  cholecalciferol (VITAMIN D) 1000 units tablet, Take 1,000 Units by mouth daily., Disp: , Rfl:  .  DEXILANT 60 MG capsule, Take 1 capsule by mouth daily., Disp: , Rfl: 0 .  Ergocalciferol (VITAMIN D2 PO), Take 1.25 mg by mouth., Disp: , Rfl:  .  ergocalciferol (VITAMIN D2) 1.25 MG (50000 UT) capsule, Take by mouth., Disp: , Rfl:  .  estradiol (ESTRACE) 1 MG tablet, TAKE 1 TABLET BY MOUTH DAILY., Disp: 30 tablet, Rfl: 12 .  fexofenadine (ALLEGRA) 180 MG tablet, Take 180 mg by mouth daily as needed. allergies, Disp: , Rfl:  .  folic acid (FOLVITE) 1 MG tablet, Take 1 tablet (1 mg total) by mouth daily., Disp: 30 tablet, Rfl: 11 .  Garlic 9937 MG CAPS, Take 1 capsule by mouth daily., Disp: , Rfl:  .  Garlic 1696 MG CAPS, Take by mouth., Disp: , Rfl:  .  levothyroxine (SYNTHROID, LEVOTHROID) 75 MCG tablet, Take 75 mcg by mouth daily before breakfast. , Disp: , Rfl:  .  metoprolol tartrate (LOPRESSOR) 25 MG tablet, TAKE 1/2 TABLET BY MOUTH TWICE A DAY, Disp: 45 tablet, Rfl: 3 .  Multiple Vitamins-Minerals (CENTRUM PO), Take 1 tablet by mouth daily.  , Disp: , Rfl:  .  olmesartan (BENICAR) 20 MG tablet, Take 20 mg by mouth daily., Disp: , Rfl:  .  progesterone (PROMETRIUM) 100 MG capsule, TAKE 1 CAPSULE BY MOUTH  DAILY FOR 15 DAYS PER MONTH, Disp: 15 capsule, Rfl: 11 .  rosuvastatin (CRESTOR) 40 MG tablet, Take 40 mg by mouth every evening. , Disp: , Rfl:  .  sodium bicarbonate 325 MG tablet, Take 325 mg by mouth 2 (two) times daily., Disp: , Rfl:  .  VASCEPA 1 g CAPS, TAKE 2 CAPSULES BY MOUTH 2 TIMES A DAY, Disp: , Rfl: 1 .  vitamin B-12 (CYANOCOBALAMIN) 1000 MCG tablet, Take 1,000 mcg by mouth daily., Disp: , Rfl:  .  vitamin C (ASCORBIC ACID) 500 MG tablet, Take 500 mg by mouth daily. , Disp: , Rfl:  .  zoledronic acid (RECLAST) 5 MG/100ML SOLN, Inject 5 mg into the vein once. Once yearly, Disp: , Rfl:   Allergies Simvastatin; Briviact [brivaracetam]; Keppra [levetiracetam]; Asparaginase derivatives; Elspar [asparaginase]; and Soap  Review of Systems Review of  Systems - Oncology ROS negative   Physical Exam  Vitals Wt Readings from Last 3 Encounters:  03/17/18 131 lb 9.6 oz (59.7 kg)  11/09/17 125 lb (56.7 kg)  09/05/17 127 lb (57.6 kg)   Temp Readings from Last 3 Encounters:  03/17/18 98.3 F (36.8 C) (Oral)  11/09/17 98 F (36.7 C) (Oral)  06/17/16 98.1 F (36.7 C) (Oral)   BP Readings from Last 3 Encounters:  03/17/18 135/66  11/09/17 136/74  09/05/17 138/76   Pulse Readings from Last 3 Encounters:  03/17/18 85  11/09/17 (!) 101  09/05/17 (!) 114   Constitutional: Well-developed, well-nourished, and in no distress.   HENT: Head: Normocephalic and atraumatic.  Mouth/Throat: No oropharyngeal exudate. Mucosa moist. Eyes: Pupils are equal, round, and reactive to light. Conjunctivae are normal. No scleral icterus.  Neck: Normal range of motion. Neck supple. No JVD present.  Cardiovascular: Normal rate, regular rhythm and normal heart sounds.  Exam reveals no gallop and no friction rub.   No murmur heard. Pulmonary/Chest: Effort normal and breath sounds normal. No respiratory distress. No wheezes.No rales.  Abdominal: Soft. Bowel sounds are normal. No distension. There is no  tenderness. There is no guarding.  Musculoskeletal: No edema or tenderness.  Lymphadenopathy: No cervical, axillary or supraclavicular adenopathy.  Neurological: Alert and oriented to person, place, and time. No cranial nerve deficit.  Skin: Skin is warm and dry. No rash noted. No erythema. No pallor.  Psychiatric: Affect and judgment normal.   Labs Appointment on 03/17/2018  Component Date Value Ref Range Status  . WBC 03/17/2018 7.7  4.0 - 10.5 K/uL Final  . RBC 03/17/2018 3.96  3.87 - 5.11 MIL/uL Final  . Hemoglobin 03/17/2018 13.5  12.0 - 15.0 g/dL Final  . HCT 03/17/2018 39.8  36.0 - 46.0 % Final  . MCV 03/17/2018 100.5* 80.0 - 100.0 fL Final  . MCH 03/17/2018 34.1* 26.0 - 34.0 pg Final  . MCHC 03/17/2018 33.9  30.0 - 36.0 g/dL Final  . RDW 03/17/2018 12.3  11.5 - 15.5 % Final  . Platelets 03/17/2018 204  150 - 400 K/uL Final  . nRBC 03/17/2018 0.0  0.0 - 0.2 % Final  . Neutrophils Relative % 03/17/2018 59  % Final  . Neutro Abs 03/17/2018 4.6  1.7 - 7.7 K/uL Final  . Lymphocytes Relative 03/17/2018 29  % Final  . Lymphs Abs 03/17/2018 2.3  0.7 - 4.0 K/uL Final  . Monocytes Relative 03/17/2018 9  % Final  . Monocytes Absolute 03/17/2018 0.7  0.1 - 1.0 K/uL Final  . Eosinophils Relative 03/17/2018 1  % Final  . Eosinophils Absolute 03/17/2018 0.1  0.0 - 0.5 K/uL Final  . Basophils Relative 03/17/2018 1  % Final  . Basophils Absolute 03/17/2018 0.1  0.0 - 0.1 K/uL Final  . Immature Granulocytes 03/17/2018 1  % Final  . Abs Immature Granulocytes 03/17/2018 0.06  0.00 - 0.07 K/uL Final   Performed at [facility], [address], [place], [place] [address]  . Sodium 03/17/2018 137  135 - 145 mmol/L Final  . Potassium 03/17/2018 4.2  3.5 - 5.1 mmol/L Final  . Chloride 03/17/2018 104  98 - 111 mmol/L Final  . CO2 03/17/2018 24  22 - 32 mmol/L Final  . Glucose, Bld 03/17/2018 106* 70 - 99 mg/dL Final  . BUN 03/17/2018 10  6 - 20 mg/dL Final  . Creatinine, Ser 03/17/2018 0.70  0.44  - 1.00 mg/dL Final  . Calcium 03/17/2018 9.8  8.9 - 10.3 mg/dL Final  . Total Protein 03/17/2018 8.0  6.5 - 8.1 g/dL Final  . Albumin 03/17/2018 4.4  3.5 - 5.0 g/dL Final  . AST 03/17/2018 43* 15 - 41 U/L Final  . ALT 03/17/2018 39  0 - 44 U/L Final  . Alkaline Phosphatase 03/17/2018 50  38 - 126 U/L Final  . Total Bilirubin 03/17/2018 0.7  0.3 - 1.2 mg/dL Final  . GFR calc non Af Amer 03/17/2018 >60  >60 mL/min Final  . GFR calc Af Amer 03/17/2018 >60  >60 mL/min Final   Comment: (NOTE) The eGFR has been calculated using the CKD EPI equation. This calculation has not been validated in all clinical situations. eGFR's persistently <60 mL/min signify possible Chronic Kidney Disease.   Georgiann Hahn gap 03/17/2018 9  5 - 15 Final   Performed at [facility], [address], [place], [place] [address]     Pathology Orders Placed This Encounter  Procedures  . MM Digital Screening    Standing Status:   Future    Standing Expiration Date:   03/17/2019    Order Specific Question:   Reason for Exam (SYMPTOM  OR DIAGNOSIS REQUIRED)    Answer:   screening    Order Specific Question:   Is the patient pregnant?    Answer:   No    Order Specific Question:   Preferred imaging location?    Answer:   [facility]  . CBC with Differential/Platelet    Standing Status:   Future    Standing Expiration Date:   03/17/2020  . Comprehensive metabolic panel    Standing Status:   Future    Standing Expiration Date:   03/17/2020  . Lactate dehydrogenase    Standing Status:   Future    Standing Expiration Date:   03/17/2020       Zoila Shutter MD

## 2018-03-18 ENCOUNTER — Other Ambulatory Visit (HOSPITAL_COMMUNITY): Payer: Self-pay | Admitting: *Deleted

## 2018-03-19 ENCOUNTER — Encounter: Payer: Self-pay | Admitting: Cardiology

## 2018-03-19 ENCOUNTER — Ambulatory Visit (INDEPENDENT_AMBULATORY_CARE_PROVIDER_SITE_OTHER): Payer: 59 | Admitting: Cardiology

## 2018-03-19 VITALS — BP 136/78 | HR 83 | Ht 59.0 in | Wt 133.2 lb

## 2018-03-19 DIAGNOSIS — R002 Palpitations: Secondary | ICD-10-CM

## 2018-03-19 MED ORDER — METOPROLOL TARTRATE 25 MG PO TABS: 25.0000 mg | ORAL_TABLET | Freq: Two times a day (BID) | ORAL | 3 refills | Status: DC

## 2018-03-19 NOTE — Progress Notes (Signed)
Clinical Summary Latoya Bautista is a 47 y.o.female seen today for follow up of the following medical problems.   1. Palpitations - symptoms late January. Varies in frequency, at times can be daily.  - feeling of heart racing. No other associated symptoms. At rest or with exertion - episodes can last a few hours.  - no coffee, occasional tea, occasional sodas, no energy drinks, no EtOH - TSH 3.49, free T4 1.28  08/2017 event monitor no significant arrhythmias, occasional PACs and PVCs - started on lopressor 12.'5mg'$  bid to help symptoms    - occasiional palpitations, somewhat better since starting lopressor but not resolved.   2. Heart murmur - she reports told a few years about murmur - no significant SOB/DOE, no LE edema - recent echo was benign   3. History of radiation exposure - history of leukemia, reports radiation treatments in 1980s  4. Hep C cirrhosis   5. Thrombocytopenia - followed by heme Past Medical History:  Diagnosis Date  . Anemia   . Cancer (HCC)    tongue cancer  . H/O echocardiogram    a. 07/2017: echo showing EF of 55-60%, Grade 1 DD, and no significant valve abnormalities.   . Hepatitis C   . Hepatitis C 04/11/2011  . Leukemia ([place])   . Liver cirrhosis ([place]) 04/11/2011  . Osteoporosis   . Seizures ([place])   . Shingles   . Thrombocytopenia ([place]) 04/11/2011  . Thyroid disease      Allergies  Allergen Reactions  . Simvastatin Rash  . Briviact [Brivaracetam]     Elevated LFT's  . Keppra [Levetiracetam]     Rash  . Asparaginase Derivatives Rash  . Elspar [Asparaginase] Rash  . Soap Rash    Procedure prep soap     Current Outpatient Medications  Medication Sig Dispense Refill  . carbamazepine (TEGRETOL XR) 400 MG 12 hr tablet Take 400 mg by mouth daily.     . cholecalciferol (VITAMIN D) 1000 units tablet Take 1,000 Units by mouth daily.    Marland Kitchen DEXILANT 60 MG capsule Take 1 capsule by mouth daily.  0  . Ergocalciferol (VITAMIN D2 PO)  Take 1.25 mg by mouth.    . ergocalciferol (VITAMIN D2) 1.25 MG (50000 UT) capsule Take by mouth.    . estradiol (ESTRACE) 1 MG tablet TAKE 1 TABLET BY MOUTH DAILY. 30 tablet 12  . fexofenadine (ALLEGRA) 180 MG tablet Take 180 mg by mouth daily as needed. allergies    . folic acid (FOLVITE) 1 MG tablet Take 1 tablet (1 mg total) by mouth daily. 30 tablet 11  . Garlic 6979 MG CAPS Take 1 capsule by mouth daily.    . Garlic 4801 MG CAPS Take by mouth.    . levothyroxine (SYNTHROID, LEVOTHROID) 75 MCG tablet Take 75 mcg by mouth daily before breakfast.     . metoprolol tartrate (LOPRESSOR) 25 MG tablet TAKE 1/2 TABLET BY MOUTH TWICE A DAY 45 tablet 3  . Multiple Vitamins-Minerals (CENTRUM PO) Take 1 tablet by mouth daily.      Marland Kitchen olmesartan (BENICAR) 20 MG tablet Take 20 mg by mouth daily.    . progesterone (PROMETRIUM) 100 MG capsule TAKE 1 CAPSULE BY MOUTH DAILY FOR 15 DAYS PER MONTH 15 capsule 11  . rosuvastatin (CRESTOR) 40 MG tablet Take 40 mg by mouth every evening.     . sodium bicarbonate 325 MG tablet Take 325 mg by mouth 2 (two) times daily.    Marland Kitchen  VASCEPA 1 g CAPS TAKE 2 CAPSULES BY MOUTH 2 TIMES A DAY  1  . vitamin B-12 (CYANOCOBALAMIN) 1000 MCG tablet Take 1,000 mcg by mouth daily.    . vitamin C (ASCORBIC ACID) 500 MG tablet Take 500 mg by mouth daily.     . vitamin E 400 UNIT capsule Take 400 Units by mouth 2 (two) times daily.    . zoledronic acid (RECLAST) 5 MG/100ML SOLN Inject 5 mg into the vein once. Once yearly     No current facility-administered medications for this visit.      Past Surgical History:  Procedure Laterality Date  . BONE MARROW BIOPSY  05/2011  . BONE MARROW TRANSPLANT    . CHOLECYSTECTOMY    . liver biopsie    . TONGUE BIOPSY    . tongue cancer     surgical removal of area     Allergies  Allergen Reactions  . Simvastatin Rash  . Briviact [Brivaracetam]     Elevated LFT's  . Keppra [Levetiracetam]     Rash  . Asparaginase Derivatives Rash  .  Elspar [Asparaginase] Rash  . Soap Rash    Procedure prep soap      Family History  Problem Relation Age of Onset  . Hypertension Father   . Diabetes Maternal Uncle      Social History Ms. Raborn reports that she has never smoked. She has never used smokeless tobacco. Ms. Eakes reports that she does not drink alcohol.   Review of Systems CONSTITUTIONAL: No weight loss, fever, chills, weakness or fatigue.  HEENT: Eyes: No visual loss, blurred vision, double vision or yellow sclerae.No hearing loss, sneezing, congestion, runny nose or sore throat.  SKIN: No rash or itching.  CARDIOVASCULAR: per hpi RESPIRATORY: No shortness of breath, cough or sputum.  GASTROINTESTINAL: No anorexia, nausea, vomiting or diarrhea. No abdominal pain or blood.  GENITOURINARY: No burning on urination, no polyuria NEUROLOGICAL: No headache, dizziness, syncope, paralysis, ataxia, numbness or tingling in the extremities. No change in bowel or bladder control.  MUSCULOSKELETAL: No muscle, back pain, joint pain or stiffness.  LYMPHATICS: No enlarged nodes. No history of splenectomy.  PSYCHIATRIC: No history of depression or anxiety.  ENDOCRINOLOGIC: No reports of sweating, cold or heat intolerance. No polyuria or polydipsia.  Marland Kitchen   Physical Examination There were no vitals filed for this visit. Filed Weights   03/19/18 0816  Weight: 133 lb 3.2 oz (60.4 kg)    Gen: resting comfortably, no acute distress HEENT: no scleral icterus, pupils equal round and reactive, no palptable cervical adenopathy,  CV: RRR, 2/6 systolic murmur rusb, no jvd Resp: Clear to auscultation bilaterally GI: abdomen is soft, non-tender, non-distended, normal bowel sounds, no hepatosplenomegaly MSK: extremities are warm, no edema.  Skin: warm, no rash Neuro:  no focal deficits Psych: appropriate affect   Diagnostic Studies 07/2017 echo Study Conclusions  - Left ventricle: The cavity size was normal. Wall thickness was    normal. Systolic function was normal. The estimated ejection   fraction was in the range of 55% to 60%. Wall motion was normal;   there were no regional wall motion abnormalities. Doppler   parameters are consistent with abnormal left ventricular   relaxation (grade 1 diastolic dysfunction). - Aortic valve: Valve area (VTI): 2.1 cm^2. Valve area (Vmax): 2.43   cm^2. Valve area (Vmean): 2.5 cm^2. - Technically adequate study.   08/2017 event monitor  14 day event monitor  Min HR 71, Max HR 173, Avg HR  102  Reported symptoms correlated with sinus rhythm and sinus tachycardia. Rare PACs and PVCs  No significant arrhythmias    Assessment and Plan  1. Palpitations - event monitor with only benign ectopy, no significant arrhythmias - we will increase lopress to '25mg'$  bid due to ongoing symptoms     Arnoldo Lenis, M.D

## 2018-03-19 NOTE — Patient Instructions (Signed)
Medication Instructions:  INCREASE Lopressor to 25 mg twice a day If you need a refill on your cardiac medications before your next appointment, please call your pharmacy.   Lab work: none If you have labs (blood work) drawn today and your tests are completely normal, you will receive your results only by: Marland Kitchen MyChart Message (if you have MyChart) OR . A paper copy in the mail If you have any lab test that is abnormal or we need to change your treatment, we will call you to review the results.  Testing/Procedures: none  Follow-Up: At [facility], you and your health needs are our priority.  As part of our continuing mission to provide you with exceptional heart care, we have created designated Provider Care Teams.  These Care Teams include your primary Cardiologist (physician) and Advanced Practice Providers (APPs -  Physician Assistants and Nurse Practitioners) who all work together to provide you with the care you need, when you need it. You will need a follow up appointment in 6 months.  Please call our office 2 months in advance to schedule this appointment.  You may see Carlyle Dolly, MD or one of the following Advanced Practice Providers on your designated Care Team:   Bernerd Pho, PA-C [facility]) . Ermalinda Barrios, PA-C ([place])  Any Other Special Instructions Will Be Listed Below (If Applicable). NONE

## 2018-03-24 MED FILL — SYNTHROID 75 MCG TABLET: 75 | 30 days supply | Qty: 30 | Fill #0

## 2018-03-25 MED FILL — ROSUVASTATIN CALCIUM 40 MG: 40 | 90 days supply | Qty: 90 | Fill #0

## 2018-03-27 ENCOUNTER — Ambulatory Visit (HOSPITAL_COMMUNITY)
Admission: RE | Admit: 2018-03-27 | Discharge: 2018-03-27 | Disposition: A | Discharge status: Home / Self Care | Payer: 59 | Source: Ambulatory Visit | Attending: Internal Medicine | Admitting: Internal Medicine

## 2018-03-27 DIAGNOSIS — Z1239 Encounter for other screening for malignant neoplasm of breast: Secondary | ICD-10-CM | POA: Diagnosis not present

## 2018-03-27 DIAGNOSIS — Z1231 Encounter for screening mammogram for malignant neoplasm of breast: Secondary | ICD-10-CM | POA: Diagnosis not present

## 2018-03-30 ENCOUNTER — Other Ambulatory Visit (HOSPITAL_COMMUNITY): Payer: Self-pay | Admitting: Family

## 2018-03-30 DIAGNOSIS — K7460 Unspecified cirrhosis of liver: Secondary | ICD-10-CM

## 2018-04-03 MED FILL — METOPROLOL TARTRATE 25 MG T: 25 | 90 days supply | Qty: 180 | Fill #0

## 2018-04-09 ENCOUNTER — Ambulatory Visit (HOSPITAL_COMMUNITY)
Admission: RE | Admit: 2018-04-09 | Discharge: 2018-04-09 | Disposition: A | Discharge status: Home / Self Care | Payer: 59 | Source: Ambulatory Visit | Attending: Family | Admitting: Family

## 2018-04-09 DIAGNOSIS — R772 Abnormality of alphafetoprotein: Secondary | ICD-10-CM | POA: Diagnosis not present

## 2018-04-09 DIAGNOSIS — K7460 Unspecified cirrhosis of liver: Secondary | ICD-10-CM | POA: Diagnosis not present

## 2018-04-09 MED ORDER — GADOBUTROL 1 MMOL/ML IV SOLN
6.0000 mL | Freq: Once | INTRAVENOUS | Status: AC | PRN
  Administered 2018-04-09: 6 mL via INTRAVENOUS

## 2018-04-13 MED FILL — PROGESTERONE 100 MG CAPSULE: 100 | 30 days supply | Qty: 15 | Fill #6

## 2018-04-13 MED FILL — ESTRADIOL 1 MG TABLET: 1 | 30 days supply | Qty: 30 | Fill #5

## 2018-04-13 MED FILL — VASCEPA 1 GM CAPSULE: 1 | 90 days supply | Qty: 360 | Fill #0

## 2018-04-13 MED FILL — CARBAMAZEPINE ER 400 MG TAB: 400 | 30 days supply | Qty: 60 | Fill #6

## 2018-04-13 MED FILL — FOLIC ACID 1 MG TABS: 1 | 30 days supply | Qty: 30 | Fill #1

## 2018-04-17 MED FILL — SYNTHROID 75 MCG TABLET: 75 | 30 days supply | Qty: 30 | Fill #1

## 2018-05-07 DIAGNOSIS — G40201 Localization-related (focal) (partial) symptomatic epilepsy and epileptic syndromes with complex partial seizures, not intractable, with status epilepticus: Secondary | ICD-10-CM | POA: Diagnosis not present

## 2018-05-07 DIAGNOSIS — N951 Menopausal and female climacteric states: Secondary | ICD-10-CM | POA: Diagnosis not present

## 2018-05-07 DIAGNOSIS — E559 Vitamin D deficiency, unspecified: Secondary | ICD-10-CM | POA: Diagnosis not present

## 2018-05-07 DIAGNOSIS — M810 Age-related osteoporosis without current pathological fracture: Secondary | ICD-10-CM | POA: Diagnosis not present

## 2018-05-07 DIAGNOSIS — E038 Other specified hypothyroidism: Secondary | ICD-10-CM | POA: Diagnosis not present

## 2018-05-07 DIAGNOSIS — K229 Disease of esophagus, unspecified: Secondary | ICD-10-CM | POA: Diagnosis not present

## 2018-05-07 DIAGNOSIS — F411 Generalized anxiety disorder: Secondary | ICD-10-CM | POA: Diagnosis not present

## 2018-05-07 DIAGNOSIS — D471 Chronic myeloproliferative disease: Secondary | ICD-10-CM | POA: Diagnosis not present

## 2018-05-07 DIAGNOSIS — E7800 Pure hypercholesterolemia, unspecified: Secondary | ICD-10-CM | POA: Diagnosis not present

## 2018-05-11 MED FILL — VIT D2 1.25 MG (50,000 UNIT: 1.25 MG | 28 days supply | Qty: 4 | Fill #0

## 2018-05-13 DIAGNOSIS — E039 Hypothyroidism, unspecified: Secondary | ICD-10-CM | POA: Diagnosis not present

## 2018-05-13 DIAGNOSIS — B182 Chronic viral hepatitis C: Secondary | ICD-10-CM | POA: Diagnosis not present

## 2018-05-13 DIAGNOSIS — R000 Tachycardia, unspecified: Secondary | ICD-10-CM | POA: Diagnosis not present

## 2018-05-13 DIAGNOSIS — R932 Abnormal findings on diagnostic imaging of liver and biliary tract: Secondary | ICD-10-CM | POA: Diagnosis not present

## 2018-05-13 DIAGNOSIS — G40309 Generalized idiopathic epilepsy and epileptic syndromes, not intractable, without status epilepticus: Secondary | ICD-10-CM | POA: Diagnosis not present

## 2018-05-13 DIAGNOSIS — G4089 Other seizures: Secondary | ICD-10-CM | POA: Diagnosis not present

## 2018-05-13 DIAGNOSIS — E782 Mixed hyperlipidemia: Secondary | ICD-10-CM | POA: Diagnosis not present

## 2018-05-13 DIAGNOSIS — H6121 Impacted cerumen, right ear: Secondary | ICD-10-CM | POA: Diagnosis not present

## 2018-05-13 DIAGNOSIS — R7301 Impaired fasting glucose: Secondary | ICD-10-CM | POA: Diagnosis not present

## 2018-05-18 MED FILL — SYNTHROID 75 MCG TABLET: 75 | 30 days supply | Qty: 30 | Fill #2

## 2018-05-18 MED FILL — CARBAMAZEPINE ER 400 MG TAB: 400 | 30 days supply | Qty: 60 | Fill #7

## 2018-05-18 MED FILL — PROGESTERONE 100 MG CAPSULE: 100 | 30 days supply | Qty: 15 | Fill #7

## 2018-05-18 MED FILL — ESTRADIOL 1 MG TABLET: 1 | 30 days supply | Qty: 30 | Fill #6

## 2018-05-18 MED FILL — FOLIC ACID 1 MG TABS: 1 | 30 days supply | Qty: 30 | Fill #2

## 2018-05-21 DIAGNOSIS — E038 Other specified hypothyroidism: Secondary | ICD-10-CM | POA: Diagnosis not present

## 2018-05-21 DIAGNOSIS — D471 Chronic myeloproliferative disease: Secondary | ICD-10-CM | POA: Diagnosis not present

## 2018-05-21 DIAGNOSIS — M810 Age-related osteoporosis without current pathological fracture: Secondary | ICD-10-CM | POA: Diagnosis not present

## 2018-05-21 DIAGNOSIS — K739 Chronic hepatitis, unspecified: Secondary | ICD-10-CM | POA: Diagnosis not present

## 2018-05-21 DIAGNOSIS — F411 Generalized anxiety disorder: Secondary | ICD-10-CM | POA: Diagnosis not present

## 2018-05-21 DIAGNOSIS — E7800 Pure hypercholesterolemia, unspecified: Secondary | ICD-10-CM | POA: Diagnosis not present

## 2018-05-21 DIAGNOSIS — G40201 Localization-related (focal) (partial) symptomatic epilepsy and epileptic syndromes with complex partial seizures, not intractable, with status epilepticus: Secondary | ICD-10-CM | POA: Diagnosis not present

## 2018-05-21 DIAGNOSIS — E559 Vitamin D deficiency, unspecified: Secondary | ICD-10-CM | POA: Diagnosis not present

## 2018-05-21 MED FILL — ZOLEDRONIC ACID 5 MG/100 ML: 5 | 1 days supply | Qty: 100 | Fill #0

## 2018-06-08 MED FILL — ROSUVASTATIN CALCIUM 40 MG: 40 | 90 days supply | Qty: 90 | Fill #0 | Status: TO

## 2018-06-08 MED FILL — VIT D2 1.25 MG (50,000 UNIT: 1.25 MG | 28 days supply | Qty: 4 | Fill #0

## 2018-06-09 ENCOUNTER — Encounter: Payer: Self-pay | Admitting: Obstetrics and Gynecology

## 2018-06-09 ENCOUNTER — Other Ambulatory Visit (HOSPITAL_COMMUNITY)
Admission: RE | Admit: 2018-06-09 | Discharge: 2018-06-09 | Disposition: A | Discharge status: Home / Self Care | Payer: 59 | Source: Ambulatory Visit | Attending: Obstetrics and Gynecology | Admitting: Obstetrics and Gynecology

## 2018-06-09 ENCOUNTER — Ambulatory Visit (INDEPENDENT_AMBULATORY_CARE_PROVIDER_SITE_OTHER): Payer: 59 | Admitting: Obstetrics and Gynecology

## 2018-06-09 ENCOUNTER — Encounter (INDEPENDENT_AMBULATORY_CARE_PROVIDER_SITE_OTHER): Payer: Self-pay

## 2018-06-09 VITALS — BP 140/83 | HR 87 | Ht 59.0 in | Wt 133.6 lb

## 2018-06-09 DIAGNOSIS — Z01419 Encounter for gynecological examination (general) (routine) without abnormal findings: Secondary | ICD-10-CM | POA: Diagnosis not present

## 2018-06-09 DIAGNOSIS — N9089 Other specified noninflammatory disorders of vulva and perineum: Secondary | ICD-10-CM | POA: Diagnosis not present

## 2018-06-09 NOTE — Addendum Note (Signed)
Addended by: Diona Fanti A on: 06/09/2018 03:06 PM   Modules accepted: Orders

## 2018-06-09 NOTE — Progress Notes (Signed)
Assessment:  Annual Gyn Exam Well woman exam Pigmented vulvar lesion. Right LABIAL MAJORA, RECOMMEND EXCISION. Plan:  1. pap smear done, next pap due 3 years 2. return annually or prn 3    Annual mammogram advised after age 48 4. Patient advised to follow up in the office for vulvar lesion biopsy Subjective:  Latoya Bautista is a 48 y.o. female No obstetric history on file. who presents for annual exam. No LMP recorded. Patient is postmenopausal. The patient has complaints today of no complaints at this time. She hasn't had any side effects of her hepatitis treatment. She has had a recent lesion noted on her liver which will be re-evaluated in April 2020. She does complete breast self exams and denies any issues that she has noticed.   She has had a mole to her buttock, however, she hasn't had it biopsied as of yet. She had an area biopsied to her LLE and was advised that it was cancerous, however, she is unsure of what type of cancer it was. This was completed with Dr. Allyn Kenner.  She has had extensive medical history of abnormal skin lesions including being followed for tongue lesion, and medical history describes it as "tongue cancer"  The following portions of the patient's history were reviewed and updated as appropriate: allergies, current medications, past family history, past medical history, past social history, past surgical history and problem list. Past Medical History:  Diagnosis Date  . Anemia   . Cancer (HCC)    tongue cancer  . H/O echocardiogram    a. 07/2017: echo showing EF of 55-60%, Grade 1 DD, and no significant valve abnormalities.   . Hepatitis C   . Hepatitis C 04/11/2011  . Leukemia ([place])   . Liver cirrhosis ([place]) 04/11/2011  . Osteoporosis   . Seizures ([place])   . Shingles   . Thrombocytopenia ([place]) 04/11/2011  . Thyroid disease     Past Surgical History:  Procedure Laterality Date  . BONE MARROW BIOPSY  05/2011  . BONE MARROW TRANSPLANT    .  CHOLECYSTECTOMY    . liver biopsie    . TONGUE BIOPSY    . tongue cancer     surgical removal of area     Current Outpatient Medications:  .  carbamazepine (TEGRETOL XR) 400 MG 12 hr tablet, Take 400 mg by mouth daily. , Disp: , Rfl:  .  cholecalciferol (VITAMIN D) 1000 units tablet, Take 1,000 Units by mouth daily., Disp: , Rfl:  .  DEXILANT 60 MG capsule, Take 1 capsule by mouth daily., Disp: , Rfl: 0 .  Ergocalciferol (VITAMIN D2 PO), Take 1.25 mg by mouth., Disp: , Rfl:  .  ergocalciferol (VITAMIN D2) 1.25 MG (50000 UT) capsule, Take by mouth., Disp: , Rfl:  .  estradiol (ESTRACE) 1 MG tablet, TAKE 1 TABLET BY MOUTH DAILY., Disp: 30 tablet, Rfl: 12 .  folic acid (FOLVITE) 1 MG tablet, Take 1 tablet (1 mg total) by mouth daily., Disp: 30 tablet, Rfl: 11 .  Garlic 4098 MG CAPS, Take 1 capsule by mouth daily., Disp: , Rfl:  .  levothyroxine (SYNTHROID, LEVOTHROID) 75 MCG tablet, Take 75 mcg by mouth daily before breakfast. , Disp: , Rfl:  .  metoprolol tartrate (LOPRESSOR) 25 MG tablet, Take 1 tablet (25 mg total) by mouth 2 (two) times daily., Disp: 180 tablet, Rfl: 3 .  Multiple Vitamins-Minerals (CENTRUM PO), Take 1 tablet by mouth daily.  , Disp: , Rfl:  .  olmesartan (  BENICAR) 20 MG tablet, Take 20 mg by mouth daily., Disp: , Rfl:  .  progesterone (PROMETRIUM) 100 MG capsule, TAKE 1 CAPSULE BY MOUTH DAILY FOR 15 DAYS PER MONTH, Disp: 15 capsule, Rfl: 11 .  rosuvastatin (CRESTOR) 40 MG tablet, Take 40 mg by mouth every evening. , Disp: , Rfl:  .  VASCEPA 1 g CAPS, TAKE 2 CAPSULES BY MOUTH 2 TIMES A DAY, Disp: , Rfl: 1 .  vitamin B-12 (CYANOCOBALAMIN) 1000 MCG tablet, Take 1,000 mcg by mouth daily., Disp: , Rfl:  .  vitamin C (ASCORBIC ACID) 500 MG tablet, Take 500 mg by mouth daily. , Disp: , Rfl:  .  vitamin E 400 UNIT capsule, Take 400 Units by mouth 2 (two) times daily., Disp: , Rfl:  .  zoledronic acid (RECLAST) 5 MG/100ML SOLN, Inject 5 mg into the vein once. Once yearly, Disp: ,  Rfl:  .  fexofenadine (ALLEGRA) 180 MG tablet, Take 180 mg by mouth daily as needed. allergies, Disp: , Rfl:   Review of Systems Constitutional: negative Gastrointestinal: negative Genitourinary: negative  Objective:  BP 140/83 (BP Location: Right Arm, Patient Position: Sitting, Cuff Size: Normal)   Pulse 87   Ht 4' 11" (1.499 m)   Wt 133 lb 9.6 oz (60.6 kg)   BMI 26.98 kg/m    BMI: Body mass index is 26.98 kg/m.  General Appearance: Alert, appropriate appearance for age. No acute distress HEENT: Grossly normal Neck / Thyroid:  Cardiovascular: RRR; normal S1, S2, no murmur Lungs: CTA bilaterally Back: No CVAT Breast Exam: No dimpling, nipple retraction or discharge. No masses or nodes. Normal to inspection and Normal breast tissue bilaterally Gastrointestinal: Soft, non-tender, no masses or organomegaly Pelvic Exam: External genitalia:  Nevus on right labia majora at 9 o'clock approaching 1 cm.  Vaginal: normal mucosa without prolapse or lesions, normal without tenderness, induration or masses and normal rugae Cervix: normal appearance Adnexa: normal bimanual exam Uterus: normal single, nontender Rectal: good sphincter tone, no masses and guaiac negative Rectovaginal: not indicated Lymphatic Exam: Non-palpable nodes in neck, clavicular, axillary, or inguinal regions Skin: no rash or abnormalities smooth well-healed 1 cm area on the left anterior shin were previous skin biopsy was done Neurologic: Normal gait and speech, no tremor  Psychiatric: Alert and oriented, appropriate affect.   Urinalysis:Not done  Mallory Shirk. MD Pgr [phone] 2:36 PM   By signing my name below, I, [name], attest that this documentation has been prepared under the direction and in the presence of [name], MD. Electronically Signed: [name], Presenter, broadcasting. 06/09/18. 2:36 PM.  I personally performed the services described in this documentation, which was SCRIBED in my  presence. The recorded information has been reviewed and considered accurate. It has been edited as necessary during review. [name], MD

## 2018-06-12 LAB — CYTOLOGY - PAP
Diagnosis: NEGATIVE
HPV: NOT DETECTED

## 2018-06-15 MED FILL — FOLIC ACID 1 MG TABS: 1 | 30 days supply | Qty: 30 | Fill #3

## 2018-06-15 MED FILL — OLMESARTAN MEDOXOMIL 20 MG: 20 | 30 days supply | Qty: 30 | Fill #0 | Status: TO

## 2018-06-15 MED FILL — CARBAMAZEPINE ER 400 MG TAB: 400 | 30 days supply | Qty: 60 | Fill #8

## 2018-06-15 MED FILL — PROGESTERONE 100 MG CAPSULE: 100 | 30 days supply | Qty: 15 | Fill #8

## 2018-06-15 MED FILL — SYNTHROID 75 MCG TABLET: 75 | 90 days supply | Qty: 90 | Fill #0 | Status: TO

## 2018-06-15 MED FILL — ESTRADIOL 1 MG TABLET: 1 | 30 days supply | Qty: 30 | Fill #7

## 2018-06-24 MED FILL — VASCEPA 1 GM CAPSULE: 1 | 90 days supply | Qty: 360 | Fill #0

## 2018-07-05 DIAGNOSIS — Z86006 Personal history of melanoma in-situ: Secondary | ICD-10-CM

## 2018-07-05 HISTORY — DX: Personal history of melanoma in-situ: Z86.006

## 2018-07-05 MED FILL — VIT D2 1.25 MG (50,000 UNIT: 1.25 MG | 28 days supply | Qty: 4 | Fill #1

## 2018-07-06 MED FILL — METOPROLOL TARTRATE 25 MG T: 25 | 90 days supply | Qty: 180 | Fill #1 | Status: TO

## 2018-07-13 MED FILL — FOLIC ACID 1 MG TABS: 1 | 30 days supply | Qty: 30 | Fill #4 | Status: TO

## 2018-07-13 MED FILL — ESTRADIOL 1 MG TABLET: 1 | 30 days supply | Qty: 30 | Fill #8 | Status: TO

## 2018-07-13 MED FILL — CARBAMAZEPINE ER 400 MG TAB: 400 | 30 days supply | Qty: 60 | Fill #9 | Status: TO

## 2018-07-13 MED FILL — PROGESTERONE 100 MG CAPSULE: 100 | 30 days supply | Qty: 15 | Fill #9 | Status: TO

## 2018-07-16 ENCOUNTER — Other Ambulatory Visit: Payer: Self-pay | Admitting: Obstetrics and Gynecology

## 2018-07-16 ENCOUNTER — Ambulatory Visit (INDEPENDENT_AMBULATORY_CARE_PROVIDER_SITE_OTHER): Payer: 59 | Admitting: Obstetrics and Gynecology

## 2018-07-16 ENCOUNTER — Other Ambulatory Visit: Payer: Self-pay

## 2018-07-16 VITALS — BP 139/83 | HR 103 | Ht 59.0 in | Wt 134.8 lb

## 2018-07-16 DIAGNOSIS — N9089 Other specified noninflammatory disorders of vulva and perineum: Secondary | ICD-10-CM

## 2018-07-16 DIAGNOSIS — D038 Melanoma in situ of other sites: Secondary | ICD-10-CM

## 2018-07-16 NOTE — Progress Notes (Signed)
SKIN TAG REMOVAL PROCEDURE NOTE The patient's identification was confirmed and consent was obtained. This procedure was performed by [name] at 1:26 PM Site & Appearance: 2 mm tag on back of neck  Sterile procedures observed yes., alcohol prep Anesthetic used: 1% lidocaine AgNO3 applied no Skin tag excised under local anesthesia, site covered with dry, sterile dressing.  Patient tolerated procedure well without complications. Minimal blood loss. Instructions for care discussed verbally    Vulvar biopsy: Patient consented to vulvar biopsy from the right labia majora, signature obtained.  Perineum prepped with Betadine, local anesthesia x10 cc 1% lidocaine infiltrated around the 1 cm lesion which was then excised with an elliptical incision around the 1.0 cm pigmented lesion.  The excision was full-thickness skin and a small bit of subcutaneous fatty tissue.  The wound was then reapproximated with continuous running 4-0 Prolene with good hemostasis and skin edge approximation.  Specimen was sent for histology evaluation. additional written instructions for homecare and f/u.

## 2018-07-23 ENCOUNTER — Telehealth: Payer: Self-pay | Admitting: *Deleted

## 2018-07-23 NOTE — Telephone Encounter (Signed)
Tiffany with [facility] called with vulva biopsy. Results- melanoma in situ. Peripheral margin is involved. Dr. Aletha Halim sign off and they will fax over results. Thanks!! [place]

## 2018-07-24 NOTE — Telephone Encounter (Signed)
Pt informed that biopsy is "melanoma in situ' with a possible involved margin. Additional margins to be obtained.  Will evaluate at f/u Wednesday and schedule further excision.

## 2018-07-29 ENCOUNTER — Encounter: Payer: Self-pay | Admitting: Obstetrics and Gynecology

## 2018-07-29 ENCOUNTER — Other Ambulatory Visit: Payer: Self-pay

## 2018-07-29 ENCOUNTER — Ambulatory Visit (INDEPENDENT_AMBULATORY_CARE_PROVIDER_SITE_OTHER): Payer: 59 | Admitting: Obstetrics and Gynecology

## 2018-07-29 DIAGNOSIS — N9089 Other specified noninflammatory disorders of vulva and perineum: Secondary | ICD-10-CM

## 2018-07-29 DIAGNOSIS — D038 Melanoma in situ of other sites: Secondary | ICD-10-CM

## 2018-07-29 NOTE — Progress Notes (Addendum)
Patient ID: Latoya Bautista, female   DOB: 1970/06/15, 48 y.o.   MRN: [id number]    [facility] Visit  '@DATE'$ @            Patient name: Latoya Bautista MRN [id number]  Date of birth: [birth year]  CC & HPI:  Latoya Bautista is a 48 y.o. female presenting today for biopsy f/u and suture removal.  Pathology: ROS:  ROS +nevus labia majora @ 9 o'clockDiagnosis Vulva, biopsy MELANOMA IN SITU ARISING IN A DYSPLASTIC NEVUS, PERIPHERAL MARGIN INVOLVED Microscopic Comment There is a proliferation of atypical melanocytes arranged as solitary units and in nests at the dermal epidermal junction with scattered atypical melanocytes throughout the upper reaches of the epidermis. Where nests are present, they tend to confluence. The dermis contains an inflammatory cell infiltrate with occasional bland appearing melanocytes and melanophages, but no diagnostic invasive disease is seen. The findings are those of melanoma in situ arising in the background of a dysplastic nevus. Following review of H&E sections, a Melan-A immunostain was used to help identify and examine the distribution of melanocytes. Ki-67 is low in the dermal component. The staining pattern supports the diagnosis. The lesion extends to the peripheral margin of the specimen. Multiple sections have been examined. Re-excision is recommended in order to ensure complete removal. The slides have also been reviewed by Dr. Fletcher Anon and Dr. Cristina Gong, who is in general agreement with the diagnosis. Results called in to [facility] and repeated back. Faxed report. (tc) 07/23/18 11:24:31 AM Shon Millet MD Dermatopathologist, Electronic Signature (Case signed 07/23/2018) Specimen Gross  Pertinent History Reviewed:   Reviewed:  Medical         Past Medical History:  Diagnosis Date  . Anemia   . Cancer (HCC)    tongue cancer  . H/O echocardiogram    a. 07/2017: echo showing EF of 55-60%, Grade 1 DD, and no significant valve abnormalities.   .  Hepatitis C   . Hepatitis C 04/11/2011  . Leukemia ([place])   . Liver cirrhosis ([place]) 04/11/2011  . Osteoporosis   . Seizures ([place])   . Shingles   . Thrombocytopenia ([place]) 04/11/2011  . Thyroid disease                               Surgical Hx:    Past Surgical History:  Procedure Laterality Date  . BONE MARROW BIOPSY  05/2011  . BONE MARROW TRANSPLANT    . CHOLECYSTECTOMY    . liver biopsie    . TONGUE BIOPSY    . tongue cancer     surgical removal of area   Medications: Reviewed & Updated - see associated section                       Current Outpatient Medications:  .  carbamazepine (TEGRETOL XR) 400 MG 12 hr tablet, Take 400 mg by mouth daily. , Disp: , Rfl:  .  cholecalciferol (VITAMIN D) 1000 units tablet, Take 1,000 Units by mouth daily., Disp: , Rfl:  .  DEXILANT 60 MG capsule, Take 1 capsule by mouth daily., Disp: , Rfl: 0 .  Ergocalciferol (VITAMIN D2 PO), Take 1.25 mg by mouth., Disp: , Rfl:  .  ergocalciferol (VITAMIN D2) 1.25 MG (50000 UT) capsule, Take by mouth., Disp: , Rfl:  .  estradiol (ESTRACE) 1 MG tablet, TAKE 1 TABLET BY MOUTH DAILY., Disp: 30 tablet, Rfl:  12 .  fexofenadine (ALLEGRA) 180 MG tablet, Take 180 mg by mouth daily as needed. allergies, Disp: , Rfl:  .  folic acid (FOLVITE) 1 MG tablet, Take 1 tablet (1 mg total) by mouth daily., Disp: 30 tablet, Rfl: 11 .  Garlic 6004 MG CAPS, Take 1 capsule by mouth daily., Disp: , Rfl:  .  levothyroxine (SYNTHROID, LEVOTHROID) 75 MCG tablet, Take 75 mcg by mouth daily before breakfast. , Disp: , Rfl:  .  metoprolol tartrate (LOPRESSOR) 25 MG tablet, Take 1 tablet (25 mg total) by mouth 2 (two) times daily., Disp: 180 tablet, Rfl: 3 .  Multiple Vitamins-Minerals (CENTRUM PO), Take 1 tablet by mouth daily.  , Disp: , Rfl:  .  olmesartan (BENICAR) 20 MG tablet, Take 20 mg by mouth daily., Disp: , Rfl:  .  progesterone (PROMETRIUM) 100 MG capsule, TAKE 1 CAPSULE BY MOUTH DAILY FOR 15 DAYS PER MONTH, Disp: 15 capsule, Rfl:  11 .  rosuvastatin (CRESTOR) 40 MG tablet, Take 40 mg by mouth every evening. , Disp: , Rfl:  .  VASCEPA 1 g CAPS, TAKE 2 CAPSULES BY MOUTH 2 TIMES A DAY, Disp: , Rfl: 1 .  vitamin B-12 (CYANOCOBALAMIN) 1000 MCG tablet, Take 1,000 mcg by mouth daily., Disp: , Rfl:  .  vitamin C (ASCORBIC ACID) 500 MG tablet, Take 500 mg by mouth daily. , Disp: , Rfl:  .  vitamin E 400 UNIT capsule, Take 400 Units by mouth 2 (two) times daily., Disp: , Rfl:  .  zoledronic acid (RECLAST) 5 MG/100ML SOLN, Inject 5 mg into the vein once. Once yearly, Disp: , Rfl:    Social History: Reviewed -  reports that she has never smoked. She has never used smokeless tobacco.  Objective Findings:  Vitals: There were no vitals taken for this visit.  PHYSICAL EXAMINATION General appearance - alert, well appearing, and in no distress Mental status - normal mood, behavior, speech, dress, motor activity, and thought processes, affect appropriate to mood  PELVIC External genitalia: Nevus on right labia majora was approaching 1 cm excised - 4-0 prolene stitches removed. Vagina - not examined Cervix - not examined Uterus - not examined  Photo in Media section   Assessment & Plan:   A:  1.  Melanoma in situ in Nevus skin lesion on right labia majora @ 9 o'clock, involved margin described on pathology note.  1. removal of prolene stitch 2. F/u in 4 weeks remainder nevus excision gyn: recheck   By signing my name below, I, Samul Dada, attest that this documentation has been prepared under the direction and in the presence of [name], MD. Electronically Signed: [place]. 07/29/18. 5:07 PM.  I personally performed the services described in this documentation, which was SCRIBED in my presence. The recorded information has been reviewed and considered accurate. It has been edited as necessary during review. [name], MD

## 2018-08-04 MED FILL — VIT D2 1.25 MG (50,000 UNIT: 1.25 MG | 35 days supply | Qty: 5 | Fill #0

## 2018-08-13 DIAGNOSIS — E039 Hypothyroidism, unspecified: Secondary | ICD-10-CM | POA: Diagnosis not present

## 2018-08-13 DIAGNOSIS — E782 Mixed hyperlipidemia: Secondary | ICD-10-CM | POA: Diagnosis not present

## 2018-08-13 DIAGNOSIS — R7301 Impaired fasting glucose: Secondary | ICD-10-CM | POA: Diagnosis not present

## 2018-08-13 DIAGNOSIS — I959 Hypotension, unspecified: Secondary | ICD-10-CM | POA: Diagnosis not present

## 2018-08-18 DIAGNOSIS — R000 Tachycardia, unspecified: Secondary | ICD-10-CM | POA: Diagnosis not present

## 2018-08-18 DIAGNOSIS — K7460 Unspecified cirrhosis of liver: Secondary | ICD-10-CM | POA: Diagnosis not present

## 2018-08-18 DIAGNOSIS — B182 Chronic viral hepatitis C: Secondary | ICD-10-CM | POA: Diagnosis not present

## 2018-08-18 DIAGNOSIS — E870 Hyperosmolality and hypernatremia: Secondary | ICD-10-CM | POA: Diagnosis not present

## 2018-08-18 DIAGNOSIS — I9589 Other hypotension: Secondary | ICD-10-CM | POA: Diagnosis not present

## 2018-08-18 DIAGNOSIS — E039 Hypothyroidism, unspecified: Secondary | ICD-10-CM | POA: Diagnosis not present

## 2018-08-18 DIAGNOSIS — D696 Thrombocytopenia, unspecified: Secondary | ICD-10-CM | POA: Diagnosis not present

## 2018-08-18 DIAGNOSIS — E782 Mixed hyperlipidemia: Secondary | ICD-10-CM | POA: Diagnosis not present

## 2018-08-18 DIAGNOSIS — R7301 Impaired fasting glucose: Secondary | ICD-10-CM | POA: Diagnosis not present

## 2018-08-19 ENCOUNTER — Telehealth: Payer: Self-pay

## 2018-08-19 MED FILL — ESTRADIOL 1 MG TABS: 1 | 30 days supply | Qty: 30 | Fill #0

## 2018-08-19 MED FILL — DEXILANT DR 60 MG CAPSULE: 60 | 90 days supply | Qty: 90 | Fill #0

## 2018-08-19 MED FILL — ROSUVASTATIN CALCIUM 40 MG: 40 | 90 days supply | Qty: 90 | Fill #0

## 2018-08-19 MED FILL — FOLIC ACID 1 MG TABS: 1 | 30 days supply | Qty: 30 | Fill #0

## 2018-08-19 MED FILL — OLMESARTAN MEDOXOMIL 20 MG: 20 | 30 days supply | Qty: 30 | Fill #0

## 2018-08-19 MED FILL — PROGESTERONE 100 MG CAPSULE: 100 | 30 days supply | Qty: 15 | Fill #0

## 2018-08-19 MED FILL — CARBAMAZEPINE ER 400 MG TAB: 400 | 30 days supply | Qty: 60 | Fill #0

## 2018-08-19 NOTE — Telephone Encounter (Signed)
Called pt. No answer. Left message for pt to return call.  

## 2018-08-24 ENCOUNTER — Ambulatory Visit (INDEPENDENT_AMBULATORY_CARE_PROVIDER_SITE_OTHER): Payer: 59 | Admitting: Obstetrics and Gynecology

## 2018-08-24 ENCOUNTER — Other Ambulatory Visit: Payer: Self-pay | Admitting: Obstetrics and Gynecology

## 2018-08-24 ENCOUNTER — Other Ambulatory Visit: Payer: Self-pay

## 2018-08-24 ENCOUNTER — Encounter: Payer: Self-pay | Admitting: Obstetrics and Gynecology

## 2018-08-24 ENCOUNTER — Telehealth: Payer: Self-pay | Admitting: Cardiology

## 2018-08-24 VITALS — BP 158/84 | HR 112 | Ht 59.0 in | Wt 137.0 lb

## 2018-08-24 DIAGNOSIS — N9089 Other specified noninflammatory disorders of vulva and perineum: Secondary | ICD-10-CM | POA: Diagnosis not present

## 2018-08-24 DIAGNOSIS — L988 Other specified disorders of the skin and subcutaneous tissue: Secondary | ICD-10-CM | POA: Diagnosis not present

## 2018-08-24 DIAGNOSIS — D0359 Melanoma in situ of other part of trunk: Secondary | ICD-10-CM

## 2018-08-24 NOTE — Progress Notes (Signed)
. Patient ID: Latoya Bautista, female   DOB: 02/20/1971, 48 y.o.   MRN: [id number]  Chief Complaint  Patient presents with  . Follow-up    vulvar lesion    HPI Latoya Bautista is a 48 y.o. female.  She returns for wide local excision of the previous biopsy site diagnosing melanoma in situ on the right portion of the vulva.  Original biopsy showed involved margins so will require 5 to 7 mm margin  caution HPI Indication: Vulvar biopsy showed involved margins on a variegated discolored area 1 cm diameter on the vulva 9:00 Symptoms:   pruritic and not painful and none  Location:  labia majora on the right  Past Medical History:  Diagnosis Date  . Anemia   . Cancer (HCC)    tongue cancer  . H/O echocardiogram    a. 07/2017: echo showing EF of 55-60%, Grade 1 DD, and no significant valve abnormalities.   . Hepatitis C   . Hepatitis C 04/11/2011  . Leukemia ([place])   . Liver cirrhosis ([place]) 04/11/2011  . Osteoporosis   . Seizures ([place])   . Shingles   . Thrombocytopenia ([place]) 04/11/2011  . Thyroid disease     Past Surgical History:  Procedure Laterality Date  . BONE MARROW BIOPSY  05/2011  . BONE MARROW TRANSPLANT    . CHOLECYSTECTOMY    . liver biopsie    . TONGUE BIOPSY    . tongue cancer     surgical removal of area    Family History  Problem Relation Age of Onset  . Hypertension Father   . Diabetes Maternal Uncle     Social History Social History   Tobacco Use  . Smoking status: Never Smoker  . Smokeless tobacco: Never Used  Substance Use Topics  . Alcohol use: No  . Drug use: No    Allergies  Allergen Reactions  . Simvastatin Rash  . Briviact [Brivaracetam]     Elevated LFT's  . Keppra [Levetiracetam]     Rash  . Asparaginase Derivatives Rash  . Elspar [Asparaginase] Rash  . Soap Rash    Procedure prep soap    Current Outpatient Medications  Medication Sig Dispense Refill  . carbamazepine (TEGRETOL XR) 400 MG 12 hr tablet Take 400 mg by mouth daily.      . cholecalciferol (VITAMIN D) 1000 units tablet Take 1,000 Units by mouth daily.    Marland Kitchen DEXILANT 60 MG capsule Take 1 capsule by mouth daily.  0  . Ergocalciferol (VITAMIN D2 PO) Take 1.25 mg by mouth.    . ergocalciferol (VITAMIN D2) 1.25 MG (50000 UT) capsule Take by mouth.    . estradiol (ESTRACE) 1 MG tablet TAKE 1 TABLET BY MOUTH DAILY. 30 tablet 12  . fexofenadine (ALLEGRA) 180 MG tablet Take 180 mg by mouth daily as needed. allergies    . folic acid (FOLVITE) 1 MG tablet Take 1 tablet (1 mg total) by mouth daily. 30 tablet 11  . Garlic 8676 MG CAPS Take 1 capsule by mouth daily.    Marland Kitchen levothyroxine (SYNTHROID, LEVOTHROID) 75 MCG tablet Take 75 mcg by mouth daily before breakfast.     . metoprolol tartrate (LOPRESSOR) 25 MG tablet Take 1 tablet (25 mg total) by mouth 2 (two) times daily. 180 tablet 3  . Multiple Vitamins-Minerals (CENTRUM PO) Take 1 tablet by mouth daily.      Marland Kitchen olmesartan (BENICAR) 20 MG tablet Take 20 mg by mouth daily.    Marland Kitchen  progesterone (PROMETRIUM) 100 MG capsule TAKE 1 CAPSULE BY MOUTH DAILY FOR 15 DAYS PER MONTH 15 capsule 11  . rosuvastatin (CRESTOR) 40 MG tablet Take 40 mg by mouth every evening.     Marland Kitchen VASCEPA 1 g CAPS TAKE 2 CAPSULES BY MOUTH 2 TIMES A DAY  1  . vitamin B-12 (CYANOCOBALAMIN) 1000 MCG tablet Take 1,000 mcg by mouth daily.    . vitamin C (ASCORBIC ACID) 500 MG tablet Take 500 mg by mouth daily.     . vitamin E 400 UNIT capsule Take 400 Units by mouth 2 (two) times daily.    . zoledronic acid (RECLAST) 5 MG/100ML SOLN Inject 5 mg into the vein once. Once yearly     No current facility-administered medications for this visit.     Review of Systems Review of Systems  Height _0  (1.499 m), weight 137 lb (62.1 kg).  Physical Exam Physical Exam Genitourinary:      Data Reviewed Prior pathology report  Assessment    Prepping with Betadine Local anesthesia with 1% Buffered Lidocaine x 10 cc 2 cm long x 1 cm wide performed per  protocol Silver Nitrate applied:  No: sutured, single layer running 3-0 Prolene Well tolerated specimen tagged at edgenearest introitus, Specimen appropriately identified and sent to pathology    Plan    Follow-up:  3 weeks suture removal. Neosporin prn bandaids.      [name] 08/24/2018, 3:38 PM

## 2018-08-24 NOTE — Telephone Encounter (Signed)
° ° °  Virtual Visit Pre-Appointment Phone Call  Steps For Call:  1. Confirm consent - "In the setting of the current Covid19 crisis, you are scheduled for a (phone or video) visit with your provider on (date) at (time).  Just as we do with many in-office visits, in order for you to participate in this visit, we must obtain consent.  If you'd like, I can send this to your mychart (if signed up) or email for you to review.  Otherwise, I can obtain your verbal consent now.  All virtual visits are billed to your insurance company just like a normal visit would be.  By agreeing to a virtual visit, we'd like you to understand that the technology does not allow for your provider to perform an examination, and thus may limit your provider's ability to fully assess your condition. If your provider identifies any concerns that need to be evaluated in person, we will make arrangements to do so.  Finally, though the technology is pretty good, we cannot assure that it will always work on either your or our end, and in the setting of a video visit, we may have to convert it to a phone-only visit.  In either situation, we cannot ensure that we have a secure connection.  Are you willing to proceed?" STAFF: Did the patient verbally acknowledge consent to telehealth visit? Document YES/NO here: Yes  2. Confirm the BEST phone number to call the day of the visit by including in appointment notes  3. Give patient instructions for WebEx/MyChart download to smartphone as below or [email or web address] if video visit (depending on what platform provider is using)  4. Advise patient to be prepared with their blood pressure, heart rate, weight, any heart rhythm information, their current medicines, and a piece of paper and pen handy for any instructions they may receive the day of their visit  5. Inform patient they will receive a phone call 15 minutes prior to their appointment time (may be from unknown caller ID) so they should be  prepared to answer  6. Confirm that appointment type is correct in Epic appointment notes (VIDEO vs PHONE)     TELEPHONE CALL NOTE  Latoya Bautista has been deemed a candidate for a follow-up tele-health visit to limit community exposure during the Covid-19 pandemic. I spoke with the patient via phone to ensure availability of phone/video source, confirm preferred email & phone number, and discuss instructions and expectations.  I reminded [name] to be prepared with any vital sign and/or heart rhythm information that could potentially be obtained via home monitoring, at the time of her visit. I reminded [name] to expect a phone call at the time of her visit if her visit.  [name] 08/24/2018 11:39 AM

## 2018-08-25 ENCOUNTER — Ambulatory Visit: Payer: Self-pay | Admitting: Obstetrics and Gynecology

## 2018-08-25 ENCOUNTER — Encounter: Payer: Self-pay | Admitting: Cardiology

## 2018-08-25 ENCOUNTER — Telehealth (INDEPENDENT_AMBULATORY_CARE_PROVIDER_SITE_OTHER): Payer: 59 | Admitting: Cardiology

## 2018-08-25 VITALS — BP 136/80 | HR 95 | Ht 59.0 in | Wt 135.0 lb

## 2018-08-25 DIAGNOSIS — R002 Palpitations: Secondary | ICD-10-CM

## 2018-08-25 NOTE — Progress Notes (Signed)
Medication Instructions:  Your physician recommends that you continue on your current medications as directed. Please refer to the Current Medication list given to you today.   Labwork: I will request labs from pcp  Testing/Procedures: None   Follow-Up: Your physician wants you to follow-up in: 1 year.  You will receive a reminder letter in the mail two months in advance. If you don't receive a letter, please call our office to schedule the follow-up appointment.  Any Other Special Instructions Will Be Listed Below (If Applicable).     If you need a refill on your cardiac medications before your next appointment, please call your pharmacy.

## 2018-08-25 NOTE — Progress Notes (Signed)
Virtual Visit via Video Note   This visit type was conducted due to national recommendations for restrictions regarding the COVID-19 Pandemic (e.g. social distancing) in an effort to limit this patient's exposure and mitigate transmission in our community.  Due to her co-morbid illnesses, this patient is at least at moderate risk for complications without adequate follow up.  This format is felt to be most appropriate for this patient at this time.  All issues noted in this document were discussed and addressed.  A limited physical exam was performed with this format.  Please refer to the patient's chart for her consent to telehealth for [facility].   Evaluation Performed:  Follow-up visit  Date:  08/25/2018   ID:  Latoya Bautista, DOB Sep 27, 1970, MRN [id number]  Patient Location: Home Provider Location: Home  PCP:  [name], MD  Cardiologist:  Carlyle Dolly, MD  Electrophysiologist:  None   Chief Complaint:  6 month follow up  History of Present Illness:    Latoya Bautista is a 48 y.o. female seen today for follow up of the following medical problems.    1. Palpitations4/2019 event monitor no significant arrhythmias, occasional PACs and PVCs - started on lopressor 12.'5mg'$  bid to help symptoms   - last visit we increased lopressor to '25mg'$  bid.  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    Past Medical History:  Diagnosis Date  . Anemia   . Cancer (HCC)    tongue cancer  . H/O echocardiogram    a. 07/2017: echo showing EF of 55-60%, Grade 1 DD, and no significant valve abnormalities.   . Hepatitis C   . Hepatitis C 04/11/2011  . Leukemia ([place])   . Liver cirrhosis ([place]) 04/11/2011  . Osteoporosis   . Seizures ([place])   . Shingles   . Thrombocytopenia ([place]) 04/11/2011  . Thyroid disease    Past Surgical History:   Procedure Laterality Date  . BONE MARROW BIOPSY  05/2011  . BONE MARROW TRANSPLANT    . CHOLECYSTECTOMY    . liver biopsie    . TONGUE BIOPSY    . tongue cancer     surgical removal of area     No outpatient medications have been marked as taking for the 08/25/18 encounter (Appointment) with Arnoldo Lenis, MD.     Allergies:   Simvastatin; Briviact [brivaracetam]; Keppra [levetiracetam]; Asparaginase derivatives; Elspar [asparaginase]; and Soap   Social History   Tobacco Use  . Smoking status: Never Smoker  . Smokeless tobacco: Never Used  Substance Use Topics  . Alcohol use: No  . Drug use: No     Family Hx: The patient's family history includes Diabetes in her maternal uncle; Hypertension in her father.  ROS:   Please see the history of present illness.    All other systems reviewed and are negative.   Prior CV studies:   The following studies were reviewed today:  07/2017 echo Study Conclusions  - Left ventricle: The cavity size was normal. Wall thickness was normal. Systolic function was normal. The estimated ejection fraction was in the range of 55% to 60%. Wall motion was normal; there were no regional wall motion abnormalities. Doppler parameters are consistent with abnormal left ventricular relaxation (grade 1 diastolic dysfunction). - Aortic valve: Valve area (VTI): 2.1 cm^2. Valve area (Vmax):  2.43 cm^2. Valve area (Vmean): 2.5 cm^2. - Technically adequate study.   08/2017 event monitor  14 day event monitor  Min HR 71, Max HR 173, Avg HR 102  Reported symptoms correlated with sinus rhythm and sinus tachycardia. Rare PACs and PVCs  No significant arrhythmias  Labs/Other Tests and Data Reviewed:    EKG:  na  Recent Labs: 03/17/2018: ALT 39; BUN 10; Creatinine, Ser 0.70; Hemoglobin 13.5; Platelets 204; Potassium 4.2; Sodium 137   Recent Lipid Panel No results found for: CHOL, TRIG, HDL, CHOLHDL, LDLCALC, LDLDIRECT  Wt  Readings from Last 3 Encounters:  08/24/18 137 lb (62.1 kg)  07/16/18 134 lb 12.8 oz (61.1 kg)  06/09/18 133 lb 9.6 oz (60.6 kg)     Objective:    Vital Signs:  p 95 bp 136/80  ASSESSMENT & PLAN:     1. Palpitations - event monitor with only benign ectopy, no significant arrhythmias - home vitals show heart rate elevated 112, from chart review prior visits and prior EKGs show heart rates have been around 110 in the past. Previous heart monitor showed average HR 102  - overall doing well, continue current lopressor dose.       COVID-19 Education: The signs and symptoms of COVID-19 were discussed with the patient and how to seek care for testing (follow up with PCP or arrange E-visit).  The importance of social distancing was discussed today.  Time:   Today, I have spent 15 minutes with the patient with telehealth technology discussing the above problems.     Medication Adjustments/Labs and Tests Ordered: Current medicines are reviewed at length with the patient today.  Concerns regarding medicines are outlined above.   Tests Ordered: No orders of the defined types were placed in this encounter.   Medication Changes: No orders of the defined types were placed in this encounter.   Disposition:  Follow up 1 year  Signed, Carlyle Dolly, MD  08/25/2018 7:43 AM    [place]

## 2018-08-26 DIAGNOSIS — E559 Vitamin D deficiency, unspecified: Secondary | ICD-10-CM | POA: Diagnosis not present

## 2018-08-26 DIAGNOSIS — E038 Other specified hypothyroidism: Secondary | ICD-10-CM | POA: Diagnosis not present

## 2018-08-27 ENCOUNTER — Other Ambulatory Visit: Payer: Self-pay | Admitting: Family

## 2018-08-27 ENCOUNTER — Other Ambulatory Visit (HOSPITAL_COMMUNITY): Payer: Self-pay | Admitting: Family

## 2018-08-27 ENCOUNTER — Encounter: Payer: Self-pay | Admitting: Obstetrics and Gynecology

## 2018-08-27 DIAGNOSIS — K7460 Unspecified cirrhosis of liver: Secondary | ICD-10-CM | POA: Diagnosis not present

## 2018-08-27 DIAGNOSIS — D0359 Melanoma in situ of other part of trunk: Secondary | ICD-10-CM | POA: Insufficient documentation

## 2018-08-27 DIAGNOSIS — K7400 Hepatic fibrosis, unspecified: Secondary | ICD-10-CM

## 2018-08-27 DIAGNOSIS — K769 Liver disease, unspecified: Secondary | ICD-10-CM | POA: Diagnosis not present

## 2018-08-27 DIAGNOSIS — R772 Abnormality of alphafetoprotein: Secondary | ICD-10-CM

## 2018-09-01 ENCOUNTER — Ambulatory Visit (HOSPITAL_COMMUNITY)
Admission: RE | Admit: 2018-09-01 | Discharge: 2018-09-01 | Disposition: A | Discharge status: Home / Self Care | Payer: 59 | Source: Ambulatory Visit | Attending: Family | Admitting: Family

## 2018-09-01 ENCOUNTER — Other Ambulatory Visit: Payer: Self-pay

## 2018-09-01 DIAGNOSIS — K769 Liver disease, unspecified: Secondary | ICD-10-CM | POA: Insufficient documentation

## 2018-09-01 DIAGNOSIS — R772 Abnormality of alphafetoprotein: Secondary | ICD-10-CM | POA: Insufficient documentation

## 2018-09-01 DIAGNOSIS — B1920 Unspecified viral hepatitis C without hepatic coma: Secondary | ICD-10-CM | POA: Diagnosis not present

## 2018-09-01 DIAGNOSIS — K7400 Hepatic fibrosis, unspecified: Secondary | ICD-10-CM

## 2018-09-01 DIAGNOSIS — E7800 Pure hypercholesterolemia, unspecified: Secondary | ICD-10-CM | POA: Diagnosis not present

## 2018-09-01 DIAGNOSIS — K739 Chronic hepatitis, unspecified: Secondary | ICD-10-CM | POA: Diagnosis not present

## 2018-09-01 DIAGNOSIS — E038 Other specified hypothyroidism: Secondary | ICD-10-CM | POA: Diagnosis not present

## 2018-09-01 DIAGNOSIS — G40201 Localization-related (focal) (partial) symptomatic epilepsy and epileptic syndromes with complex partial seizures, not intractable, with status epilepticus: Secondary | ICD-10-CM | POA: Diagnosis not present

## 2018-09-01 DIAGNOSIS — E559 Vitamin D deficiency, unspecified: Secondary | ICD-10-CM | POA: Diagnosis not present

## 2018-09-01 DIAGNOSIS — K7460 Unspecified cirrhosis of liver: Secondary | ICD-10-CM | POA: Diagnosis not present

## 2018-09-01 DIAGNOSIS — F411 Generalized anxiety disorder: Secondary | ICD-10-CM | POA: Diagnosis not present

## 2018-09-01 DIAGNOSIS — M810 Age-related osteoporosis without current pathological fracture: Secondary | ICD-10-CM | POA: Diagnosis not present

## 2018-09-01 DIAGNOSIS — D471 Chronic myeloproliferative disease: Secondary | ICD-10-CM | POA: Diagnosis not present

## 2018-09-01 LAB — POCT I-STAT CREATININE: Creatinine, Ser: 0.7 mg/dL (ref 0.44–1.00)

## 2018-09-01 MED ORDER — GADOBUTROL 1 MMOL/ML IV SOLN
6.0000 mL | Freq: Once | INTRAVENOUS | Status: AC | PRN
  Administered 2018-09-01: 6 mL via INTRAVENOUS

## 2018-09-01 MED FILL — VIT D2 1.25 MG (50,000 UNIT: 1.25 MG | 84 days supply | Qty: 12 | Fill #0

## 2018-09-01 MED FILL — SYNTHROID 75 MCG TABLET: 75 | 90 days supply | Qty: 90 | Fill #0

## 2018-09-03 DIAGNOSIS — K769 Liver disease, unspecified: Secondary | ICD-10-CM | POA: Diagnosis not present

## 2018-09-14 ENCOUNTER — Telehealth: Payer: Self-pay | Admitting: *Deleted

## 2018-09-14 NOTE — Telephone Encounter (Signed)
Pt aware no visitors or children at tomorrow's appt. Also, pt hasn't come in contact with anyone in the last month that has been confirmed or suspected of having Covid-19 nor is she experiencing any symptoms herself. Pt plans on coming to appt. Pt will bring her own mask. [place]

## 2018-09-15 ENCOUNTER — Encounter: Payer: Self-pay | Admitting: Obstetrics and Gynecology

## 2018-09-15 ENCOUNTER — Other Ambulatory Visit: Payer: Self-pay

## 2018-09-15 ENCOUNTER — Ambulatory Visit: Payer: 59 | Admitting: Obstetrics and Gynecology

## 2018-09-15 VITALS — BP 139/80 | HR 82 | Temp 97.9°F | Ht 59.0 in | Wt 134.8 lb

## 2018-09-15 DIAGNOSIS — D0359 Melanoma in situ of other part of trunk: Secondary | ICD-10-CM | POA: Diagnosis not present

## 2018-09-15 NOTE — Progress Notes (Signed)
Patient ID: Latoya Bautista, female   DOB: 09/01/1970, 48 y.o.   MRN: [id number]    [facility] Visit  _0 @            Patient name: Latoya Bautista MRN [id number]  Date of birth: Aug 19, 1970  CC & HPI:  Latoya Bautista is a 48 y.o. female presenting today for 3 week f/u after vulvar biopsy & to have suture removal. Pt had  Melanoma in situ on right portion of vulva.  She says the pain after this excision is much less than after the initial biopsy  ROS:  ROS Vulvar lesion  Pertinent History Reviewed:   Reviewed: Pathology report which shows the wider local excision was entirely clean, with no residual melanoma in situ identifiable Medical         Past Medical History:  Diagnosis Date  . Anemia   . Cancer (HCC)    tongue cancer  . H/O echocardiogram    a. 07/2017: echo showing EF of 55-60%, Grade 1 DD, and no significant valve abnormalities.   . Hepatitis C   . Hepatitis C 04/11/2011  . Leukemia ([place])   . Liver cirrhosis ([place]) 04/11/2011  . Osteoporosis   . Seizures ([place])   . Shingles   . Thrombocytopenia ([place]) 04/11/2011  . Thyroid disease                               Surgical Hx:    Past Surgical History:  Procedure Laterality Date  . BONE MARROW BIOPSY  05/2011  . BONE MARROW TRANSPLANT    . CHOLECYSTECTOMY    . liver biopsie    . TONGUE BIOPSY    . tongue cancer     surgical removal of area   Medications: Reviewed & Updated - see associated section                       Current Outpatient Medications:  .  carbamazepine (TEGRETOL XR) 400 MG 12 hr tablet, Take 400 mg by mouth daily. , Disp: , Rfl:  .  cholecalciferol (VITAMIN D) 1000 units tablet, Take 1,000 Units by mouth daily., Disp: , Rfl:  .  DEXILANT 60 MG capsule, Take 1 capsule by mouth daily., Disp: , Rfl: 0 .  Ergocalciferol (VITAMIN D2 PO), Take 1.25 mg by mouth., Disp: , Rfl:  .  ergocalciferol (VITAMIN D2) 1.25 MG (50000 UT) capsule, Take by mouth., Disp: , Rfl:  .  estradiol (ESTRACE) 1 MG  tablet, TAKE 1 TABLET BY MOUTH DAILY., Disp: 30 tablet, Rfl: 12 .  fexofenadine (ALLEGRA) 180 MG tablet, Take 180 mg by mouth daily as needed. allergies, Disp: , Rfl:  .  folic acid (FOLVITE) 1 MG tablet, Take 1 tablet (1 mg total) by mouth daily., Disp: 30 tablet, Rfl: 11 .  Garlic 5732 MG CAPS, Take 1 capsule by mouth daily., Disp: , Rfl:  .  levothyroxine (SYNTHROID, LEVOTHROID) 75 MCG tablet, Take 75 mcg by mouth daily before breakfast. , Disp: , Rfl:  .  metoprolol tartrate (LOPRESSOR) 25 MG tablet, Take 1 tablet (25 mg total) by mouth 2 (two) times daily., Disp: 180 tablet, Rfl: 3 .  Multiple Vitamins-Minerals (CENTRUM PO), Take 1 tablet by mouth daily.  , Disp: , Rfl:  .  olmesartan (BENICAR) 20 MG tablet, Take 20 mg by mouth daily., Disp: , Rfl:  .  progesterone (PROMETRIUM) 100 MG  capsule, TAKE 1 CAPSULE BY MOUTH DAILY FOR 15 DAYS PER MONTH, Disp: 15 capsule, Rfl: 11 .  rosuvastatin (CRESTOR) 40 MG tablet, Take 40 mg by mouth every evening. , Disp: , Rfl:  .  VASCEPA 1 g CAPS, TAKE 2 CAPSULES BY MOUTH 2 TIMES A DAY, Disp: , Rfl: 1 .  vitamin B-12 (CYANOCOBALAMIN) 1000 MCG tablet, Take 1,000 mcg by mouth daily., Disp: , Rfl:  .  vitamin C (ASCORBIC ACID) 500 MG tablet, Take 500 mg by mouth daily. , Disp: , Rfl:  .  vitamin E 400 UNIT capsule, Take 400 Units by mouth 2 (two) times daily., Disp: , Rfl:  .  zoledronic acid (RECLAST) 5 MG/100ML SOLN, Inject 5 mg into the vein once. Once yearly, Disp: , Rfl:    Social History: Reviewed -  reports that she has never smoked. She has never used smokeless tobacco.  Objective Findings:  Vitals: There were no vitals taken for this visit.  PHYSICAL EXAMINATION General appearance - alert, well appearing, and in no distress Mental status - alert, oriented to person, place, and time, normal mood, behavior, speech, dress, motor activity, and thought processes, affect appropriate to mood  PELVIC External genitalia - normal appearing Vulva -  melanoma in situ site on the right gluteal cheek is well-healed, swelling decreased, sutures removed Vagina - 66m granulation tissue at the inner most suture is trimmed and base treated with silver nitrate Cervix - not exmained Uterus - not examined    Assessment & Plan:   A:  1.  Vulvar lesion melanoma in situ, healing well, sutures removed 2. Granulations tissue  255mat inner entrance, non-worrisome, trimmed 3. Hx of cancer  P:  1. F/u if excision bleeds more than 2 days 2. F/u 1 year   By signing my name below, I, MaSamul Dadaattest that this documentation has been prepared under the direction and in the presence of FeJonnie KindMD. Electronically Signed: Ma[place]ica Beach05/12/20. 2:08 PM.  I personally performed the services described in this documentation, which was SCRIBED in my presence. The recorded information has been reviewed and considered accurate. It has been edited as necessary during review. JoJonnie KindMD

## 2018-09-20 MED FILL — OLMESARTAN MEDOXOMIL 20 MG: 20 | 30 days supply | Qty: 30 | Fill #1

## 2018-09-20 MED FILL — FOLIC ACID 1 MG TABS: 1 | 30 days supply | Qty: 30 | Fill #1

## 2018-09-20 MED FILL — CARBAMAZEPINE ER 400 MG TAB: 400 | 30 days supply | Qty: 60 | Fill #1

## 2018-09-20 MED FILL — ESTRADIOL 1 MG TABS: 1 | 30 days supply | Qty: 30 | Fill #1

## 2018-09-22 MED FILL — VASCEPA 1 GM CAPSULE: 1 | 90 days supply | Qty: 360 | Fill #0

## 2018-10-08 MED FILL — METOPROLOL TARTRATE 25 MG T: 25 | 90 days supply | Qty: 180 | Fill #0

## 2018-10-19 ENCOUNTER — Other Ambulatory Visit: Payer: Self-pay | Admitting: Obstetrics and Gynecology

## 2018-10-19 MED FILL — ESTRADIOL 1 MG TABLET: 1 | 30 days supply | Qty: 30 | Fill #0

## 2018-10-19 MED FILL — FOLIC ACID 1 MG TABS: 1 | 30 days supply | Qty: 30 | Fill #0

## 2018-10-19 MED FILL — OLMESARTAN MEDOXOMIL 20 MG: 20 | 30 days supply | Qty: 30 | Fill #0

## 2018-10-20 MED FILL — CARBAMAZEPINE ER 400 MG TAB: 400 | 30 days supply | Qty: 60 | Fill #0

## 2018-10-20 MED FILL — PROGESTERONE 100 MG CAPSULE: 100 | 30 days supply | Qty: 15 | Fill #0

## 2018-10-20 NOTE — Telephone Encounter (Signed)
refil prometrium to take 15 days per month x 1 yr . Sent to pharmacy

## 2018-11-01 DIAGNOSIS — M79672 Pain in left foot: Secondary | ICD-10-CM | POA: Diagnosis not present

## 2018-11-01 DIAGNOSIS — M84375A Stress fracture, left foot, initial encounter for fracture: Secondary | ICD-10-CM | POA: Diagnosis not present

## 2018-11-03 ENCOUNTER — Telehealth: Payer: Self-pay | Admitting: Orthopaedic Surgery

## 2018-11-03 NOTE — Telephone Encounter (Signed)
Latoya Bautista called this morning stating that she was seen at [facility] for foot pain.  This was on Sunday, 11/01/18.  She said they told her that they would need to refer her to ortho and was going to fax her notes to our office.  I checked both of our workqueues and paper referrals. I could not find any referral for this patient.  I told her this and asked her if she would call them back to see if they could re-fax the information.  I gave her our fax number to give to the urgent care.  I told her once we had this information, we would call her with an appointment.

## 2018-11-04 DIAGNOSIS — E559 Vitamin D deficiency, unspecified: Secondary | ICD-10-CM | POA: Diagnosis not present

## 2018-11-04 DIAGNOSIS — G40201 Localization-related (focal) (partial) symptomatic epilepsy and epileptic syndromes with complex partial seizures, not intractable, with status epilepticus: Secondary | ICD-10-CM | POA: Diagnosis not present

## 2018-11-04 DIAGNOSIS — M810 Age-related osteoporosis without current pathological fracture: Secondary | ICD-10-CM | POA: Diagnosis not present

## 2018-11-04 DIAGNOSIS — N951 Menopausal and female climacteric states: Secondary | ICD-10-CM | POA: Diagnosis not present

## 2018-11-04 DIAGNOSIS — E038 Other specified hypothyroidism: Secondary | ICD-10-CM | POA: Diagnosis not present

## 2018-11-04 DIAGNOSIS — D471 Chronic myeloproliferative disease: Secondary | ICD-10-CM | POA: Diagnosis not present

## 2018-11-04 DIAGNOSIS — K739 Chronic hepatitis, unspecified: Secondary | ICD-10-CM | POA: Diagnosis not present

## 2018-11-04 DIAGNOSIS — F411 Generalized anxiety disorder: Secondary | ICD-10-CM | POA: Diagnosis not present

## 2018-11-05 ENCOUNTER — Other Ambulatory Visit: Payer: Self-pay

## 2018-11-05 ENCOUNTER — Ambulatory Visit: Payer: 59 | Admitting: Orthopaedic Surgery

## 2018-11-05 ENCOUNTER — Encounter: Payer: Self-pay | Admitting: Orthopaedic Surgery

## 2018-11-05 VITALS — BP 130/80 | HR 96 | Ht 59.0 in | Wt 127.0 lb

## 2018-11-05 DIAGNOSIS — M79672 Pain in left foot: Secondary | ICD-10-CM | POA: Diagnosis not present

## 2018-11-05 NOTE — Progress Notes (Signed)
Subjective:    Patient ID: Latoya Bautista, female    DOB: 1970-12-07, 48 y.o.   MRN: [id number]  HPI She has had foot pain on the left for a little while.  From talking to her I am still unable to determine how long a little while is.  She was seen at the Urgent Care at [facility] and had x-rays done. She was told she had a fracture of the great toe.  She was seen by her endocrinologist a few days ago and was told there is no fracture.  She wears a silicone spacer between the first and second toes.    It is difficult to get a fully definitive answer from her. She has no trauma, no redness, no swelling but she has pain and she has a very hard time trying to define the pain she has.  She says it just hurts in the foot but not always, often after walking, usually not at rest.  She has no numbness.  I have reviewed the x-rays from the urgent care and I do not see a fracture.  I have told the patient this.   Review of Systems  Constitutional: Positive for activity change.  Musculoskeletal: Positive for arthralgias, gait problem and joint swelling.  Neurological: Positive for seizures.  All other systems reviewed and are negative.  For Review of Systems, all other systems reviewed and are negative.  The following is a summary of the past history medically, past history surgically, known current medicines, social history and family history.  This information is gathered electronically by the computer from prior information and documentation.  I review this each visit and have found including this information at this point in the chart is beneficial and informative.   Past Medical History:  Diagnosis Date  . Anemia   . Cancer (HCC)    tongue cancer  . H/O echocardiogram    a. 07/2017: echo showing EF of 55-60%, Grade 1 DD, and no significant valve abnormalities.   . Hepatitis C   . Hepatitis C 04/11/2011  . Leukemia ([place])   . Liver cirrhosis ([place]) 04/11/2011  . Osteoporosis   .  Seizures ([place])   . Shingles   . Thrombocytopenia ([place]) 04/11/2011  . Thyroid disease     Past Surgical History:  Procedure Laterality Date  . BONE MARROW BIOPSY  05/2011  . BONE MARROW TRANSPLANT    . CHOLECYSTECTOMY    . liver biopsie    . TONGUE BIOPSY    . tongue cancer     surgical removal of area    Current Outpatient Medications on File Prior to Visit  Medication Sig Dispense Refill  . carbamazepine (TEGRETOL XR) 400 MG 12 hr tablet carbamazepine ER 400 mg tablet,extended release,12 hr    . cholecalciferol (VITAMIN D) 1000 units tablet Take 1,000 Units by mouth daily.    Marland Kitchen DEXILANT 60 MG capsule Take 1 capsule by mouth daily.  0  . Ergocalciferol (VITAMIN D2 PO) Take 1.25 mg by mouth.    . ergocalciferol (VITAMIN D2) 1.25 MG (50000 UT) capsule Take by mouth.    . estradiol (ESTRACE) 1 MG tablet TAKE 1 TABLET BY MOUTH DAILY. 30 tablet 12  . fexofenadine (ALLEGRA) 180 MG tablet Take 180 mg by mouth daily as needed. allergies    . folic acid (FOLVITE) 1 MG tablet Take 1 tablet (1 mg total) by mouth daily. 30 tablet 11  . Garlic 6203 MG CAPS Take 1 capsule by  mouth daily.    Marland Kitchen levothyroxine (SYNTHROID, LEVOTHROID) 75 MCG tablet Take 75 mcg by mouth daily before breakfast.     . metoprolol tartrate (LOPRESSOR) 25 MG tablet Take 1 tablet (25 mg total) by mouth 2 (two) times daily. 180 tablet 3  . Multiple Vitamins-Minerals (CENTRUM PO) Take 1 tablet by mouth daily.      Marland Kitchen olmesartan (BENICAR) 20 MG tablet Take 20 mg by mouth daily.    . progesterone (PROMETRIUM) 100 MG capsule TAKE 1 CAPSULE BY MOUTH DAILY FOR 15 DAYS PER MONTH 15 capsule 11  . rosuvastatin (CRESTOR) 40 MG tablet Take 40 mg by mouth every evening.     Marland Kitchen VASCEPA 1 g CAPS TAKE 2 CAPSULES BY MOUTH 2 TIMES A DAY  1  . vitamin B-12 (CYANOCOBALAMIN) 1000 MCG tablet Take 1,000 mcg by mouth daily.    . vitamin C (ASCORBIC ACID) 500 MG tablet Take 500 mg by mouth daily.     . vitamin E 400 UNIT capsule Take 400 Units by  mouth 2 (two) times daily.    . zoledronic acid (RECLAST) 5 MG/100ML SOLN Inject 5 mg into the vein once. Once yearly    . carbamazepine (TEGRETOL XR) 400 MG 12 hr tablet Take 400 mg by mouth daily.      No current facility-administered medications on file prior to visit.     Social History   Socioeconomic History  . Marital status: Married    Spouse name: Shanon Brow  . Number of children: 0  . Years of education: 12th  . Highest education level: Not on file  Occupational History    Employer: [employer]  . Financial resource strain: Not on file  . Food insecurity    Worry: Not on file    Inability: Not on file  . Transportation needs    Medical: Not on file    Non-medical: Not on file  Tobacco Use  . Smoking status: Never Smoker  . Smokeless tobacco: Never Used  Substance and Sexual Activity  . Alcohol use: No  . Drug use: No  . Sexual activity: Never    Birth control/protection: None, Post-menopausal  Lifestyle  . Physical activity    Days per week: Not on file    Minutes per session: Not on file  . Stress: Not on file  Relationships  . Social Herbalist on phone: Not on file    Gets together: Not on file    Attends religious service: Not on file    Active member of club or organization: Not on file    Attends meetings of clubs or organizations: Not on file    Relationship status: Not on file  . Intimate partner violence    Fear of current or ex partner: Not on file    Emotionally abused: Not on file    Physically abused: Not on file    Forced sexual activity: Not on file  Other Topics Concern  . Not on file  Social History Narrative   Patient lives at home with her spouse.   Caffeine Use: 16oz bottle of soda daily    Family History  Problem Relation Age of Onset  . Hypertension Father   . Diabetes Maternal Uncle     BP 130/80   Pulse 96   Ht _0  (1.499 m)   Wt 127 lb (57.6 kg)   BMI 25.65 kg/m   Body mass  index is 25.65 kg/m.  Objective:   Physical Exam Vitals signs reviewed.  Constitutional:      Appearance: She is well-developed.  HENT:     Head: Normocephalic and atraumatic.  Eyes:     Conjunctiva/sclera: Conjunctivae normal.     Pupils: Pupils are equal, round, and reactive to light.  Neck:     Musculoskeletal: Normal range of motion and neck supple.  Cardiovascular:     Rate and Rhythm: Normal rate and regular rhythm.  Pulmonary:     Effort: Pulmonary effort is normal.  Abdominal:     Palpations: Abdomen is soft.  Musculoskeletal:       Feet:  Skin:    General: Skin is warm and dry.  Neurological:     Mental Status: She is alert and oriented to person, place, and time.     Cranial Nerves: No cranial nerve deficit.     Motor: No abnormal muscle tone.     Coordination: Coordination normal.     Deep Tendon Reflexes: Reflexes are normal and symmetric. Reflexes normal.  Psychiatric:        Behavior: Behavior normal.        Thought Content: Thought content normal.        Judgment: Judgment normal.           Assessment & Plan:   Encounter Diagnosis  Name Primary?  . Pain in left foot Yes   I will have podiatry see her.    She does have flat feet and loss of the metatarsal arch.  I mentioned possible inserts but she says she cannot use them.  I mentioned use of ibuprofen or Aleve.  She says she will take Tylenol.  I told her Tylenol does not have the anti-inflammatory effect like the other two but she said she will stick with the Tylenol.  Electronically Signed Sanjuana Kava, MD 7/2/20209:27 AM

## 2018-11-05 NOTE — Patient Instructions (Signed)
Out of work until appt with podiatry

## 2018-11-10 ENCOUNTER — Ambulatory Visit: Payer: 59 | Admitting: Sports Medicine

## 2018-11-10 ENCOUNTER — Ambulatory Visit: Payer: 59

## 2018-11-10 ENCOUNTER — Other Ambulatory Visit: Payer: Self-pay

## 2018-11-10 ENCOUNTER — Encounter: Payer: Self-pay | Admitting: Sports Medicine

## 2018-11-10 VITALS — BP 153/85 | HR 108 | Temp 98.1°F | Resp 16

## 2018-11-10 DIAGNOSIS — M729 Fibroblastic disorder, unspecified: Secondary | ICD-10-CM | POA: Diagnosis not present

## 2018-11-10 DIAGNOSIS — M2142 Flat foot [pes planus] (acquired), left foot: Secondary | ICD-10-CM

## 2018-11-10 DIAGNOSIS — M2141 Flat foot [pes planus] (acquired), right foot: Secondary | ICD-10-CM | POA: Diagnosis not present

## 2018-11-10 DIAGNOSIS — S96812A Strain of other specified muscles and tendons at ankle and foot level, left foot, initial encounter: Secondary | ICD-10-CM

## 2018-11-10 DIAGNOSIS — S96819A Strain of other specified muscles and tendons at ankle and foot level, unspecified foot, initial encounter: Secondary | ICD-10-CM

## 2018-11-10 DIAGNOSIS — M79672 Pain in left foot: Secondary | ICD-10-CM | POA: Diagnosis not present

## 2018-11-10 MED ORDER — METHYLPREDNISOLONE 4 MG PO TBPK: ORAL_TABLET | ORAL | 0 refills | Status: DC

## 2018-11-10 NOTE — Progress Notes (Signed)
Subjective: Latoya Bautista is a 48 y.o. female patient who presents to office for evaluation of left foot pain. Patient complains of progressive pain since 2 weeks ago states that she was walking in the store and was standing and felt a pull through her ball of her foot that radiated to her arch states that anytime she bends her toes up there is pain that shoots down her foot into the arch and also sometimes pain across the top of her foot states that she went done and they told her to take some Tylenol and Aleve which has not healed and to rest the foot and told her that she can stay out of work until her office visit with [place].  Patient reports that she is flat-footed and has used toe spacers for many years to help with the bunions and take pressure off her hammertoes however this pain that she is having now is new in nature.  Patient denies any known injury or trauma fall trip or sprain.  Patient states that the pain is a difficult for her to walk especially barefoot but the pain feels a little bit better when she is in shoes.  Patient denies any other pedal complaints.   Review of Systems  All other systems reviewed and are negative.    Patient Active Problem List   Diagnosis Date Noted  . Melanoma in situ of skin of buttock ([place]) 08/27/2018  . Vulvar lesion 06/09/2018  . History of thrombocytopenia 12/11/2015  . Well woman exam with routine gynecological exam 10/18/2013  . Localization-related epilepsy ([place]) 01/01/2013  . Partial epilepsy with impairment of consciousness ([place]) 12/15/2012  . Postmenopausal bleeding DUE TO hormone tx. 10/08/2012  . Hepatitis C 04/11/2011  . Liver cirrhosis ([place]) 04/11/2011  . Thrombocytopenia ([place]) 04/11/2011    Current Outpatient Medications on File Prior to Visit  Medication Sig Dispense Refill  . carbamazepine (TEGRETOL XR) 400 MG 12 hr tablet carbamazepine ER 400 mg tablet,extended release,12 hr    . cholecalciferol (VITAMIN D) 1000 units tablet Take  1,000 Units by mouth daily.    Marland Kitchen DEXILANT 60 MG capsule Take 1 capsule by mouth daily.  0  . Ergocalciferol (VITAMIN D2 PO) Take 1.25 mg by mouth.    . ergocalciferol (VITAMIN D2) 1.25 MG (50000 UT) capsule Take by mouth.    . estradiol (ESTRACE) 1 MG tablet TAKE 1 TABLET BY MOUTH DAILY. 30 tablet 12  . fexofenadine (ALLEGRA) 180 MG tablet Take 180 mg by mouth daily as needed. allergies    . folic acid (FOLVITE) 1 MG tablet Take 1 tablet (1 mg total) by mouth daily. 30 tablet 11  . Garlic 7341 MG CAPS Take 1 capsule by mouth daily.    Marland Kitchen levothyroxine (SYNTHROID, LEVOTHROID) 75 MCG tablet Take 75 mcg by mouth daily before breakfast.     . metoprolol tartrate (LOPRESSOR) 25 MG tablet Take 1 tablet (25 mg total) by mouth 2 (two) times daily. 180 tablet 3  . Multiple Vitamins-Minerals (CENTRUM PO) Take 1 tablet by mouth daily.      Marland Kitchen olmesartan (BENICAR) 20 MG tablet Take 20 mg by mouth daily.    . progesterone (PROMETRIUM) 100 MG capsule TAKE 1 CAPSULE BY MOUTH DAILY FOR 15 DAYS PER MONTH 15 capsule 11  . rosuvastatin (CRESTOR) 40 MG tablet Take 40 mg by mouth every evening.     Marland Kitchen VASCEPA 1 g CAPS TAKE 2 CAPSULES BY MOUTH 2 TIMES A DAY  1  . vitamin  B-12 (CYANOCOBALAMIN) 1000 MCG tablet Take 1,000 mcg by mouth daily.    . vitamin C (ASCORBIC ACID) 500 MG tablet Take 500 mg by mouth daily.     . vitamin E 400 UNIT capsule Take 400 Units by mouth 2 (two) times daily.    . zoledronic acid (RECLAST) 5 MG/100ML SOLN Inject 5 mg into the vein once. Once yearly     No current facility-administered medications on file prior to visit.     Allergies  Allergen Reactions  . Simvastatin Rash  . Briviact [Brivaracetam]     Elevated LFT's  . Keppra [Levetiracetam]     Rash  . Asparaginase Derivatives Rash  . Elspar [Asparaginase] Rash  . Soap Rash    Procedure prep soap    Objective:  General: Alert and oriented x3 in no acute distress  Dermatology: No open lesions bilateral lower extremities,  no webspace macerations, no ecchymosis bilateral, all nails x 10 are well manicured.  Vascular: Dorsalis Pedis and Posterior Tibial pedal pulses palpable, Capillary Fill Time 3 seconds,(+) pedal hair growth bilateral, no edema bilateral lower extremities, Temperature gradient within normal limits.  Neurology: Johney Maine sensation intact via light touch bilateral.  Musculoskeletal: Mild tenderness with palpation at ball of the left foot that worsens as the to press along the plantar fascia at the level of the arch on the left foot, pes planus foot type, bunion and hammertoes that are currently asymptomatic using toe spacers. Strength within normal limits in all groups bilateral with mild guarding with flexion extension especially on the left due to pain.   Gait: Antalgic gait  Assessment and Plan: Problem List Items Addressed This Visit    None    Visit Diagnoses    Arch pain of left foot    -  Primary   Pes planus of both feet       Strain of flexor tendon of foot       Fasciitis           -Complete examination performed -Xrays reviewed on CD which is negative for any acute pathology -Discussed treatement options for likely plantar fascial strain and arch pain -Rx Medrol Dosepak to take as instructed -Advised rest ice elevation and to avoid over stretching or extending the toes -Dispense fascial brace to wear as instructed to add as some additional arch support to take stress and strain off the plantar fascia -Advised patient to continue with no work for at least the next 3 weeks will determine if she can return to work based on how she is doing after next office visit -Patient to return to office in 3 weeks or sooner if condition worsens.  Landis Martins, DPM

## 2018-11-11 MED FILL — METHYLPREDNISOLONE 4 MG TAB: 4 | 6 days supply | Qty: 21 | Fill #0

## 2018-11-20 ENCOUNTER — Other Ambulatory Visit: Payer: Self-pay | Admitting: Obstetrics and Gynecology

## 2018-11-20 MED FILL — VIT D2 1.25 MG (50,000 UNIT: 1.25 MG | 84 days supply | Qty: 12 | Fill #0

## 2018-11-20 MED FILL — DEXILANT DR 60 MG CAPSULE: 60 | 90 days supply | Qty: 90 | Fill #0

## 2018-11-20 MED FILL — CARBAMAZEPINE ER 400 MG TAB: 400 | 30 days supply | Qty: 60 | Fill #1

## 2018-11-20 MED FILL — PROGESTERONE 100 MG CAPSULE: 100 | 30 days supply | Qty: 15 | Fill #1

## 2018-11-20 MED FILL — OLMESARTAN MEDOXOMIL 20 MG: 20 | 30 days supply | Qty: 30 | Fill #1

## 2018-11-20 MED FILL — SYNTHROID 75 MCG TABLET: 75 | 90 days supply | Qty: 90 | Fill #0

## 2018-11-20 MED FILL — FOLIC ACID 1 MG TABS: 1 | 30 days supply | Qty: 30 | Fill #1

## 2018-11-20 MED FILL — ROSUVASTATIN CALCIUM 40 MG: 40 | 90 days supply | Qty: 90 | Fill #0

## 2018-11-23 ENCOUNTER — Encounter: Payer: Self-pay | Admitting: Obstetrics and Gynecology

## 2018-11-24 MED FILL — ESTRADIOL 1 MG TABLET: 1 | 30 days supply | Qty: 30 | Fill #0

## 2018-12-01 ENCOUNTER — Telehealth: Payer: Self-pay | Admitting: *Deleted

## 2018-12-01 ENCOUNTER — Ambulatory Visit: Payer: 59 | Admitting: Sports Medicine

## 2018-12-01 ENCOUNTER — Encounter: Payer: Self-pay | Admitting: Sports Medicine

## 2018-12-01 ENCOUNTER — Other Ambulatory Visit: Payer: Self-pay

## 2018-12-01 VITALS — Temp 97.9°F

## 2018-12-01 DIAGNOSIS — M729 Fibroblastic disorder, unspecified: Secondary | ICD-10-CM

## 2018-12-01 DIAGNOSIS — S96819A Strain of other specified muscles and tendons at ankle and foot level, unspecified foot, initial encounter: Secondary | ICD-10-CM

## 2018-12-01 DIAGNOSIS — T148XXA Other injury of unspecified body region, initial encounter: Secondary | ICD-10-CM

## 2018-12-01 DIAGNOSIS — M79672 Pain in left foot: Secondary | ICD-10-CM | POA: Diagnosis not present

## 2018-12-01 NOTE — Telephone Encounter (Signed)
Orders to Gretta Arab, RN for pre-cert and faxed to [facility]

## 2018-12-01 NOTE — Telephone Encounter (Signed)
I spoke with [place] states orders should be faxed to [phone], and MRI contact phone [phone].

## 2018-12-01 NOTE — Telephone Encounter (Signed)
Pt called states she would like to have the MRI of the Left foot performed at the [facility] on [address], [place].

## 2018-12-01 NOTE — Telephone Encounter (Signed)
I called pt and asked which of the Imaging centers in [facility] she would prefer and she stated the one on the exit. I read the name and address of the imaging centers in [facility] and she stated she would check MyChart and call again.

## 2018-12-01 NOTE — Progress Notes (Addendum)
Subjective: Latoya Bautista is a 48 y.o. female patient who returns to office for follow up evaluation of Left foot pain at ball and arch. Patient now ranks pain the same and states that she is not feeling better. Patient has been using medrol and brace without relief in symptoms. Admits some swelling and that pain has been so bad that she had to use Tylenol at night and topical pain cream. Patient denies any other pedal complaints.   Patient Active Problem List   Diagnosis Date Noted  . Melanoma in situ of skin of buttock ([place]) 08/27/2018  . Vulvar lesion 06/09/2018  . History of thrombocytopenia 12/11/2015  . Well woman exam with routine gynecological exam 10/18/2013  . Localization-related epilepsy ([place]) 01/01/2013  . Partial epilepsy with impairment of consciousness ([place]) 12/15/2012  . Postmenopausal bleeding DUE TO hormone tx. 10/08/2012  . Hepatitis C 04/11/2011  . Liver cirrhosis ([place]) 04/11/2011  . Thrombocytopenia ([place]) 04/11/2011   Current Outpatient Medications on File Prior to Visit  Medication Sig Dispense Refill  . carbamazepine (TEGRETOL XR) 400 MG 12 hr tablet carbamazepine ER 400 mg tablet,extended release,12 hr    . cholecalciferol (VITAMIN D) 1000 units tablet Take 1,000 Units by mouth daily.    Marland Kitchen DEXILANT 60 MG capsule Take 1 capsule by mouth daily.  0  . Ergocalciferol (VITAMIN D2 PO) Take 1.25 mg by mouth.    . ergocalciferol (VITAMIN D2) 1.25 MG (50000 UT) capsule Take by mouth.    . estradiol (ESTRACE) 1 MG tablet TAKE 1 TABLET BY MOUTH DAILY. 30 tablet 1  . fexofenadine (ALLEGRA) 180 MG tablet Take 180 mg by mouth daily as needed. allergies    . folic acid (FOLVITE) 1 MG tablet Take 1 tablet (1 mg total) by mouth daily. 30 tablet 11  . Garlic 6720 MG CAPS Take 1 capsule by mouth daily.    Marland Kitchen levothyroxine (SYNTHROID, LEVOTHROID) 75 MCG tablet Take 75 mcg by mouth daily before breakfast.     . methylPREDNISolone (MEDROL DOSEPAK) 4 MG TBPK tablet Take as directed 21  tablet 0  . metoprolol tartrate (LOPRESSOR) 25 MG tablet Take 1 tablet (25 mg total) by mouth 2 (two) times daily. 180 tablet 3  . Multiple Vitamins-Minerals (CENTRUM PO) Take 1 tablet by mouth daily.      Marland Kitchen olmesartan (BENICAR) 20 MG tablet Take 20 mg by mouth daily.    . progesterone (PROMETRIUM) 100 MG capsule TAKE 1 CAPSULE BY MOUTH DAILY FOR 15 DAYS PER MONTH 15 capsule 11  . rosuvastatin (CRESTOR) 40 MG tablet Take 40 mg by mouth every evening.     Marland Kitchen VASCEPA 1 g CAPS TAKE 2 CAPSULES BY MOUTH 2 TIMES A DAY  1  . vitamin B-12 (CYANOCOBALAMIN) 1000 MCG tablet Take 1,000 mcg by mouth daily.    . vitamin C (ASCORBIC ACID) 500 MG tablet Take 500 mg by mouth daily.     . vitamin E 400 UNIT capsule Take 400 Units by mouth 2 (two) times daily.    . zoledronic acid (RECLAST) 5 MG/100ML SOLN Inject 5 mg into the vein once. Once yearly     No current facility-administered medications on file prior to visit.    Allergies  Allergen Reactions  . Simvastatin Rash  . Briviact [Brivaracetam]     Elevated LFT's  . Keppra [Levetiracetam]     Rash  . Asparaginase Derivatives Rash  . Elspar [Asparaginase] Rash  . Soap Rash    Procedure prep soap  Objective:  Dermatology: No open lesions bilateral lower extremities, no webspace macerations, no ecchymosis bilateral, all nails x 10 are well manicured.  Vascular: Dorsalis Pedis and Posterior Tibial pedal pulses palpable, Capillary Fill Time 3 seconds,(+) pedal hair growth bilateral, no edema bilateral lower extremities, Temperature gradient within normal limits.  Neurology: Johney Maine sensation intact via light touch bilateral.  Musculoskeletal: Mild to moderate tenderness with palpation at ball of the left foot that worsens with pressing along the plantar fascia at the level of the arch on the left foot, pes planus foot type, bunion and hammertoes that are currently asymptomatic using toe spacers. Strength within normal limits in all groups bilateral  with mild guarding with flexion extension especially on the left due to pain like before with some pain that goes to calf on left.   Assessment and Plan: Problem List Items Addressed This Visit    None    Visit Diagnoses    Tendon tear    -  Primary   Strain of flexor tendon of foot       Fasciitis       Arch pain of left foot          -Complete examination performed -Discussed treatement options for strain vs tear -Rx MRI for further eval r/o tear at plantar fascia -Dispensed CAM boot and advised patient to use this when doing a lot of standing/walking -Continue with no work until MRI is done -Recommend rest, ice, elevation until symptoms get better  -Patient to return to office after MRI or sooner if condition worsens.  MRI HAS BEEN COMPLETED NOW. RECOMMEND TO EXTEND LEAVE/NO WORK UNTIL NEXT OFFICE VISIT. PATIENT HAS A STRESS FRACTURE AND MUST WEAR CAM BOOT; UNABLE AT THIS TIME TO EXCESSIVE WALKING AND STANDING, LIFTING, PUSHING, PULLING DUE TO FRACTURE AND PAIN IN FOOT.  Landis Martins, DPM

## 2018-12-01 NOTE — Telephone Encounter (Signed)
-----   Message from Landis Martins, [place] sent at 12/01/2018  8:31 AM EDT ----- Regarding: MRI L foot R/o tear Worsening pain and swelling at ball and plantar fascia on left  Patient wants to see if she can get it scheduled for Thursday at imaging center in [facility]

## 2018-12-03 ENCOUNTER — Telehealth: Payer: Self-pay | Admitting: Sports Medicine

## 2018-12-03 DIAGNOSIS — Z79899 Other long term (current) drug therapy: Secondary | ICD-10-CM | POA: Diagnosis not present

## 2018-12-03 DIAGNOSIS — K7460 Unspecified cirrhosis of liver: Secondary | ICD-10-CM | POA: Diagnosis not present

## 2018-12-03 DIAGNOSIS — M729 Fibroblastic disorder, unspecified: Secondary | ICD-10-CM

## 2018-12-03 DIAGNOSIS — G40209 Localization-related (focal) (partial) symptomatic epilepsy and epileptic syndromes with complex partial seizures, not intractable, without status epilepticus: Secondary | ICD-10-CM | POA: Diagnosis not present

## 2018-12-03 DIAGNOSIS — M8580 Other specified disorders of bone density and structure, unspecified site: Secondary | ICD-10-CM | POA: Diagnosis not present

## 2018-12-03 DIAGNOSIS — E039 Hypothyroidism, unspecified: Secondary | ICD-10-CM | POA: Diagnosis not present

## 2018-12-03 DIAGNOSIS — T148XXA Other injury of unspecified body region, initial encounter: Secondary | ICD-10-CM

## 2018-12-03 DIAGNOSIS — S96819A Strain of other specified muscles and tendons at ankle and foot level, unspecified foot, initial encounter: Secondary | ICD-10-CM

## 2018-12-03 DIAGNOSIS — K769 Liver disease, unspecified: Secondary | ICD-10-CM | POA: Diagnosis not present

## 2018-12-03 DIAGNOSIS — M79672 Pain in left foot: Secondary | ICD-10-CM

## 2018-12-03 NOTE — Telephone Encounter (Signed)
Pt was planning to get an MRI done at [facility] at the same time as another MRI for a different office but did not get authorization from insurance in time.   Pt would now like to change the location on the MRI to a facility in [place] now.

## 2018-12-04 NOTE — Addendum Note (Signed)
Addended by: Harriett Sine D on: 12/04/2018 01:03 PM   Modules accepted: Orders

## 2018-12-04 NOTE — Telephone Encounter (Signed)
I told pt I would have the MRI location changed to [facility] and it would need to have the location changed for her insurance.

## 2018-12-04 NOTE — Telephone Encounter (Signed)
Faxed new orders with the location of [facility].

## 2018-12-08 DIAGNOSIS — Z20828 Contact with and (suspected) exposure to other viral communicable diseases: Secondary | ICD-10-CM | POA: Diagnosis not present

## 2018-12-09 ENCOUNTER — Ambulatory Visit (HOSPITAL_COMMUNITY)
Admission: RE | Admit: 2018-12-09 | Discharge: 2018-12-09 | Disposition: A | Discharge status: Home / Self Care | Payer: 59 | Source: Ambulatory Visit | Attending: Sports Medicine | Admitting: Sports Medicine

## 2018-12-09 ENCOUNTER — Other Ambulatory Visit: Payer: Self-pay

## 2018-12-09 DIAGNOSIS — M79672 Pain in left foot: Secondary | ICD-10-CM | POA: Diagnosis not present

## 2018-12-09 DIAGNOSIS — T148XXA Other injury of unspecified body region, initial encounter: Secondary | ICD-10-CM | POA: Diagnosis not present

## 2018-12-09 DIAGNOSIS — M729 Fibroblastic disorder, unspecified: Secondary | ICD-10-CM | POA: Insufficient documentation

## 2018-12-09 DIAGNOSIS — S96819A Strain of other specified muscles and tendons at ankle and foot level, unspecified foot, initial encounter: Secondary | ICD-10-CM | POA: Diagnosis not present

## 2018-12-09 DIAGNOSIS — S92335A Nondisplaced fracture of third metatarsal bone, left foot, initial encounter for closed fracture: Secondary | ICD-10-CM | POA: Diagnosis not present

## 2018-12-10 ENCOUNTER — Telehealth: Payer: Self-pay | Admitting: Sports Medicine

## 2018-12-10 NOTE — Telephone Encounter (Signed)
I CALLED HER YESTERDAY AND TODAY; Will you let patient know that her MRI shows that she has a stress fracture of her foot. She should continue with CAM boot at all times. It will take 8-12 weeks for this to heal. There is no need for surgery for this type of fracture. She should follow up in office in 1 month for [place] to check to see how she is doing.  Thanks  Dr. Chauncey Cruel

## 2018-12-10 NOTE — Telephone Encounter (Signed)
Pt had an MRI done yesterday and is calling to follow up and see if the doctor has her results. Please give patient a call.

## 2018-12-11 ENCOUNTER — Telehealth: Payer: Self-pay | Admitting: *Deleted

## 2018-12-11 NOTE — Telephone Encounter (Signed)
I informed pt of Dr. Leeanne Rio review of MRI results and orders. Pt asked if her leave would be extended past the 17th. I told pt I would send to Dr. Cannon Kettle and FMLA.

## 2018-12-11 NOTE — Telephone Encounter (Addendum)
Emailed letter to [email or web address] pt's FMLA leave per Dr. Cannon Kettle until reevaluated 01/12/2019.

## 2018-12-11 NOTE — Telephone Encounter (Signed)
Pt contacted.

## 2018-12-11 NOTE — Telephone Encounter (Signed)
Patient is scheduled for an appt on 01/12/19 @ 1:45PM with Dr.Stover.

## 2018-12-11 NOTE — Telephone Encounter (Signed)
Thanks

## 2018-12-11 NOTE — Telephone Encounter (Signed)
-----   Message from Landis Martins, [place] sent at 12/09/2018  5:40 PM EDT ----- Will you let patient know that her MRI shows that she has a stress fracture of her foot. She should continue with CAM boot at all times. It will take 8-12 weeks for this to heal. There is no need for surgery for this type of fracture. She should follow up in office in 1 month for [place] to check to see how she is doing.  Thanks Dr. Chauncey Cruel

## 2018-12-21 ENCOUNTER — Other Ambulatory Visit: Payer: Self-pay | Admitting: Obstetrics and Gynecology

## 2018-12-21 MED FILL — PROGESTERONE 100 MG CAPSULE: 100 | 30 days supply | Qty: 15 | Fill #0

## 2018-12-21 MED FILL — CARBAMAZEPINE ER 400 MG TAB: 400 | 90 days supply | Qty: 180 | Fill #0

## 2018-12-21 MED FILL — FOLIC ACID 1 MG TABS: 1 | 30 days supply | Qty: 30 | Fill #2

## 2018-12-21 MED FILL — ESTRADIOL 1 MG TABLET: 1 | 30 days supply | Qty: 30 | Fill #1

## 2018-12-28 MED FILL — VASCEPA 1 GM CAPSULE: 1 | 90 days supply | Qty: 360 | Fill #0

## 2019-01-04 MED FILL — METOPROLOL TARTRATE 25 MG T: 25 | 90 days supply | Qty: 180 | Fill #0

## 2019-01-12 ENCOUNTER — Ambulatory Visit: Payer: Self-pay

## 2019-01-12 ENCOUNTER — Encounter: Payer: Self-pay | Admitting: Sports Medicine

## 2019-01-12 ENCOUNTER — Other Ambulatory Visit: Payer: Self-pay

## 2019-01-12 ENCOUNTER — Ambulatory Visit: Payer: 59 | Admitting: Sports Medicine

## 2019-01-12 DIAGNOSIS — M79672 Pain in left foot: Secondary | ICD-10-CM

## 2019-01-12 DIAGNOSIS — T148XXA Other injury of unspecified body region, initial encounter: Secondary | ICD-10-CM | POA: Diagnosis not present

## 2019-01-12 DIAGNOSIS — M84375D Stress fracture, left foot, subsequent encounter for fracture with routine healing: Secondary | ICD-10-CM

## 2019-01-12 NOTE — Progress Notes (Signed)
Subjective: Latoya Bautista is a 48 y.o. female patient who returns to office for follow up evaluation of Left foot pain at ball and arch. Patient now ranks pain the pain 2/10 with some relief with use of cam boot. Admits some swelling and that is better. Boot sometimes hurts. Patient denies any other pedal complaints.   Patient Active Problem List   Diagnosis Date Noted  . Melanoma in situ of skin of buttock ([place]) 08/27/2018  . Vulvar lesion 06/09/2018  . History of thrombocytopenia 12/11/2015  . Well woman exam with routine gynecological exam 10/18/2013  . Localization-related epilepsy ([place]) 01/01/2013  . Partial epilepsy with impairment of consciousness ([place]) 12/15/2012  . Postmenopausal bleeding DUE TO hormone tx. 10/08/2012  . Hepatitis C 04/11/2011  . Liver cirrhosis ([place]) 04/11/2011  . Thrombocytopenia ([place]) 04/11/2011   Current Outpatient Medications on File Prior to Visit  Medication Sig Dispense Refill  . carbamazepine (TEGRETOL XR) 400 MG 12 hr tablet carbamazepine ER 400 mg tablet,extended release,12 hr    . cholecalciferol (VITAMIN D) 1000 units tablet Take 1,000 Units by mouth daily.    Marland Kitchen DEXILANT 60 MG capsule Take 1 capsule by mouth daily.  0  . Ergocalciferol (VITAMIN D2 PO) Take 1.25 mg by mouth.    . ergocalciferol (VITAMIN D2) 1.25 MG (50000 UT) capsule Take by mouth.    . estradiol (ESTRACE) 1 MG tablet TAKE 1 TABLET BY MOUTH DAILY. 30 tablet 1  . fexofenadine (ALLEGRA) 180 MG tablet Take 180 mg by mouth daily as needed. allergies    . folic acid (FOLVITE) 1 MG tablet Take 1 tablet (1 mg total) by mouth daily. 30 tablet 11  . Garlic 0349 MG CAPS Take 1 capsule by mouth daily.    Marland Kitchen levothyroxine (SYNTHROID, LEVOTHROID) 75 MCG tablet Take 75 mcg by mouth daily before breakfast.     . methylPREDNISolone (MEDROL DOSEPAK) 4 MG TBPK tablet Take as directed (Patient not taking: Reported on 12/01/2018) 21 tablet 0  . metoprolol tartrate (LOPRESSOR) 25 MG tablet Take 1 tablet  (25 mg total) by mouth 2 (two) times daily. 180 tablet 3  . Multiple Vitamins-Minerals (CENTRUM PO) Take 1 tablet by mouth daily.      Marland Kitchen olmesartan (BENICAR) 20 MG tablet Take 20 mg by mouth daily.    . progesterone (PROMETRIUM) 100 MG capsule TAKE 1 CAPSULE BY MOUTH DAILY FOR 15 DAYS PER MONTH 15 capsule 1  . rosuvastatin (CRESTOR) 40 MG tablet Take 40 mg by mouth every evening.     Marland Kitchen VASCEPA 1 g CAPS TAKE 2 CAPSULES BY MOUTH 2 TIMES A DAY  1  . vitamin B-12 (CYANOCOBALAMIN) 1000 MCG tablet Take 1,000 mcg by mouth daily.    . vitamin C (ASCORBIC ACID) 500 MG tablet Take 500 mg by mouth daily.     . vitamin E 400 UNIT capsule Take 400 Units by mouth 2 (two) times daily.    . zoledronic acid (RECLAST) 5 MG/100ML SOLN Inject 5 mg into the vein once. Once yearly     No current facility-administered medications on file prior to visit.    Allergies  Allergen Reactions  . Simvastatin Rash  . Briviact [Brivaracetam]     Elevated LFT's  . Keppra [Levetiracetam]     Rash  . Asparaginase Derivatives Rash  . Elspar [Asparaginase] Rash  . Soap Rash    Procedure prep soap    Objective:  Dermatology: No open lesions bilateral lower extremities, no webspace macerations, no ecchymosis  bilateral, all nails x 10 are well manicured.  Vascular: Dorsalis Pedis and Posterior Tibial pedal pulses palpable, Capillary Fill Time 3 seconds,(+) pedal hair growth bilateral, no edema bilateral lower extremities, Temperature gradient within normal limits.  Neurology: Johney Maine sensation intact via light touch bilateral.  Musculoskeletal: Mild to base at 3rd met base on left, moderate tenderness with palpation at ball of the left foot that worsens with pressing along the plantar fascia at the level of the arch on the left foot, pes planus foot type, bunion and hammertoes that are currently asymptomatic using toe spacers. Strength within normal limits in all groups bilateral with mild guarding with flexion extension  especially on the left due to pain like before with some pain that goes to calf on left.   Assessment and Plan: Problem List Items Addressed This Visit    None    Visit Diagnoses    Stress fracture of metatarsal bone of left foot with routine healing, subsequent encounter    -  Primary   Tendon tear       Relevant Orders   DG Foot Complete Left   Arch pain of left foot       Relevant Orders   DG Foot Complete Left   Left foot pain          -Complete examination performed -Discussed treatement options for stress fracture -Continue with CAM boot for 1 more week with transition to shoe with brace -Medical leave for 2 more weeks then can try tennis shoe at work with expected return on 9-19 to light duty  -Recommend rest, ice, elevation   -Patient to return in 1 month for final xray  Landis Martins, DPM

## 2019-01-18 MED FILL — FOLIC ACID 1 MG TABS: 1 | 30 days supply | Qty: 30 | Fill #3

## 2019-01-18 MED FILL — PROGESTERONE 100 MG CAPSULE: 100 | 30 days supply | Qty: 15 | Fill #1

## 2019-01-19 ENCOUNTER — Telehealth: Payer: Self-pay | Admitting: Sports Medicine

## 2019-01-19 NOTE — Telephone Encounter (Signed)
Return to work 9/19. Allow patient periods of sitting and to wear tennis shoes at work. Limit her total walking/standing time to no more than 6 hours per shift.  Patient to transition out of boot to tennis shoe

## 2019-01-19 NOTE — Telephone Encounter (Signed)
Pt called and states FMLA would like to know restriction and when pt is to return to work, scheduled to return to work 01/23/2019 or 01/24/2019. Pt states Dr. Cannon Kettle said if she should have problems transitioning from the boot to a shoe to let her know, but she doesn't know if having pain in the boot and in the shoe is what Dr.Stover was referring to.

## 2019-01-19 NOTE — Telephone Encounter (Signed)
Left message for pt to call to discuss the boot and then I would discuss with Dr. Cannon Kettle her restrictions for returning to work.

## 2019-01-19 NOTE — Telephone Encounter (Signed)
Pt called and stated that she wanted to speak to Dr. Cannon Kettle about her boot. She also wanted to know about the restrictions when she goes back to work. Please call patient.

## 2019-01-20 NOTE — Telephone Encounter (Signed)
I informed pt, Dr. Leeanne Rio recommendations for return to work and would have ready for pick up or delivery. Pt states she will have to call back with the fax number.

## 2019-01-20 NOTE — Telephone Encounter (Signed)
Pt presented to office to pick up letter for work.

## 2019-02-01 ENCOUNTER — Other Ambulatory Visit: Payer: Self-pay | Admitting: Obstetrics and Gynecology

## 2019-02-01 MED FILL — OLMESARTAN MEDOXOMIL 20 MG: 20 | 30 days supply | Qty: 30 | Fill #2

## 2019-02-02 DIAGNOSIS — E782 Mixed hyperlipidemia: Secondary | ICD-10-CM | POA: Diagnosis not present

## 2019-02-02 DIAGNOSIS — R7301 Impaired fasting glucose: Secondary | ICD-10-CM | POA: Diagnosis not present

## 2019-02-02 DIAGNOSIS — E039 Hypothyroidism, unspecified: Secondary | ICD-10-CM | POA: Diagnosis not present

## 2019-02-04 MED FILL — ESTRADIOL 1 MG TABLET: 1 | 30 days supply | Qty: 30 | Fill #0

## 2019-02-05 MED FILL — DEXILANT DR 60 MG CAPSULE: 60 | 90 days supply | Qty: 90 | Fill #0

## 2019-02-09 ENCOUNTER — Encounter: Payer: Self-pay | Admitting: Sports Medicine

## 2019-02-09 ENCOUNTER — Ambulatory Visit (INDEPENDENT_AMBULATORY_CARE_PROVIDER_SITE_OTHER): Payer: 59

## 2019-02-09 ENCOUNTER — Other Ambulatory Visit: Payer: Self-pay

## 2019-02-09 ENCOUNTER — Ambulatory Visit (INDEPENDENT_AMBULATORY_CARE_PROVIDER_SITE_OTHER): Payer: 59 | Admitting: Sports Medicine

## 2019-02-09 DIAGNOSIS — M79672 Pain in left foot: Secondary | ICD-10-CM | POA: Diagnosis not present

## 2019-02-09 DIAGNOSIS — M84375D Stress fracture, left foot, subsequent encounter for fracture with routine healing: Secondary | ICD-10-CM

## 2019-02-09 NOTE — Progress Notes (Signed)
Subjective: KAICEE SCARPINO is a 48 y.o. female patient who returns to office for follow up evaluation of Left foot pain at ball and arch. Patient now ranks pain the pain 1-2/10 reports that brace hurts. Reports that she is back at work and they did not let her work light duty. Patient denies any other pedal complaints.   Patient Active Problem List   Diagnosis Date Noted  . Melanoma in situ of skin of buttock ([place]) 08/27/2018  . Vulvar lesion 06/09/2018  . History of thrombocytopenia 12/11/2015  . Well woman exam with routine gynecological exam 10/18/2013  . Localization-related epilepsy ([place]) 01/01/2013  . Partial epilepsy with impairment of consciousness ([place]) 12/15/2012  . Postmenopausal bleeding DUE TO hormone tx. 10/08/2012  . Hepatitis C 04/11/2011  . Liver cirrhosis ([place]) 04/11/2011  . Thrombocytopenia ([place]) 04/11/2011   Current Outpatient Medications on File Prior to Visit  Medication Sig Dispense Refill  . carbamazepine (TEGRETOL XR) 400 MG 12 hr tablet carbamazepine ER 400 mg tablet,extended release,12 hr    . cholecalciferol (VITAMIN D) 1000 units tablet Take 1,000 Units by mouth daily.    Marland Kitchen DEXILANT 60 MG capsule Take 1 capsule by mouth daily.  0  . Ergocalciferol (VITAMIN D2 PO) Take 1.25 mg by mouth.    . ergocalciferol (VITAMIN D2) 1.25 MG (50000 UT) capsule Take by mouth.    . estradiol (ESTRACE) 1 MG tablet TAKE 1 TABLET BY MOUTH DAILY. 30 tablet 10  . fexofenadine (ALLEGRA) 180 MG tablet Take 180 mg by mouth daily as needed. allergies    . folic acid (FOLVITE) 1 MG tablet Take 1 tablet (1 mg total) by mouth daily. 30 tablet 11  . Garlic 0174 MG CAPS Take 1 capsule by mouth daily.    Marland Kitchen levothyroxine (SYNTHROID, LEVOTHROID) 75 MCG tablet Take 75 mcg by mouth daily before breakfast.     . metoprolol tartrate (LOPRESSOR) 25 MG tablet Take 1 tablet (25 mg total) by mouth 2 (two) times daily. 180 tablet 3  . Multiple Vitamins-Minerals (CENTRUM PO) Take 1 tablet by mouth  daily.      Marland Kitchen olmesartan (BENICAR) 20 MG tablet Take 20 mg by mouth daily.    . progesterone (PROMETRIUM) 100 MG capsule TAKE 1 CAPSULE BY MOUTH DAILY FOR 15 DAYS PER MONTH 15 capsule 1  . rosuvastatin (CRESTOR) 40 MG tablet Take 40 mg by mouth every evening.     Marland Kitchen VASCEPA 1 g CAPS TAKE 2 CAPSULES BY MOUTH 2 TIMES A DAY  1  . vitamin B-12 (CYANOCOBALAMIN) 1000 MCG tablet Take 1,000 mcg by mouth daily.    . vitamin C (ASCORBIC ACID) 500 MG tablet Take 500 mg by mouth daily.     . vitamin E 400 UNIT capsule Take 400 Units by mouth 2 (two) times daily.    . zoledronic acid (RECLAST) 5 MG/100ML SOLN Inject 5 mg into the vein once. Once yearly     No current facility-administered medications on file prior to visit.    Allergies  Allergen Reactions  . Simvastatin Rash  . Briviact [Brivaracetam]     Elevated LFT's  . Keppra [Levetiracetam]     Rash  . Asparaginase Derivatives Rash  . Elspar [Asparaginase] Rash  . Soap Rash    Procedure prep soap    Objective:  Dermatology: No open lesions bilateral lower extremities, no webspace macerations, no ecchymosis bilateral, all nails x 10 are well manicured.  Vascular: Dorsalis Pedis and Posterior Tibial pedal pulses palpable, Capillary  Fill Time 3 seconds,(+) pedal hair growth bilateral, no edema bilateral lower extremities, Temperature gradient within normal limits.  Neurology: Johney Maine sensation intact via light touch bilateral.  Musculoskeletal: Decreased tenderness to base at 3rd met base, decreased pain to plantar foot on left. No other acute findings.   Assessment and Plan: Problem List Items Addressed This Visit    None    Visit Diagnoses    Stress fracture of metatarsal bone of left foot with routine healing, subsequent encounter    -  Primary   Relevant Orders   DG Foot Complete Left (Completed)   Arch pain of left foot       Left foot pain          -Complete examination performed -Re-Discussed long term care after stress  fracture -Patient may return to work with no restrictions  -Recommend rest, ice, elevation as needed and good supportive shoes; Added arch pad to left -May discontinue fascial brace since not helping any more  -Patient to return as needed or sooner if problems arise Landis Martins, DPM

## 2019-02-11 DIAGNOSIS — D696 Thrombocytopenia, unspecified: Secondary | ICD-10-CM | POA: Diagnosis not present

## 2019-02-11 DIAGNOSIS — R7301 Impaired fasting glucose: Secondary | ICD-10-CM | POA: Diagnosis not present

## 2019-02-11 DIAGNOSIS — K7460 Unspecified cirrhosis of liver: Secondary | ICD-10-CM | POA: Diagnosis not present

## 2019-02-11 DIAGNOSIS — E870 Hyperosmolality and hypernatremia: Secondary | ICD-10-CM | POA: Diagnosis not present

## 2019-02-11 DIAGNOSIS — I9589 Other hypotension: Secondary | ICD-10-CM | POA: Diagnosis not present

## 2019-02-11 DIAGNOSIS — B182 Chronic viral hepatitis C: Secondary | ICD-10-CM | POA: Diagnosis not present

## 2019-02-11 DIAGNOSIS — E782 Mixed hyperlipidemia: Secondary | ICD-10-CM | POA: Diagnosis not present

## 2019-02-11 DIAGNOSIS — R000 Tachycardia, unspecified: Secondary | ICD-10-CM | POA: Diagnosis not present

## 2019-02-11 DIAGNOSIS — E039 Hypothyroidism, unspecified: Secondary | ICD-10-CM | POA: Diagnosis not present

## 2019-02-15 ENCOUNTER — Other Ambulatory Visit: Payer: Self-pay | Admitting: Obstetrics and Gynecology

## 2019-02-15 MED FILL — PROGESTERONE 100 MG CAPSULE: 100 | 30 days supply | Qty: 15 | Fill #0

## 2019-02-15 NOTE — Telephone Encounter (Signed)
Refilled progesterone x 5 months

## 2019-02-17 ENCOUNTER — Telehealth: Payer: Self-pay | Admitting: *Deleted

## 2019-02-17 NOTE — Telephone Encounter (Signed)
Patient informed refill sent on 10/12. Verbalized understanding and stated she would check with pharmacy again.

## 2019-02-17 NOTE — Telephone Encounter (Signed)
Needs a refill on Prometrium.

## 2019-02-25 DIAGNOSIS — E559 Vitamin D deficiency, unspecified: Secondary | ICD-10-CM | POA: Diagnosis not present

## 2019-02-25 DIAGNOSIS — E038 Other specified hypothyroidism: Secondary | ICD-10-CM | POA: Diagnosis not present

## 2019-03-03 DIAGNOSIS — E038 Other specified hypothyroidism: Secondary | ICD-10-CM | POA: Diagnosis not present

## 2019-03-03 DIAGNOSIS — G40201 Localization-related (focal) (partial) symptomatic epilepsy and epileptic syndromes with complex partial seizures, not intractable, with status epilepticus: Secondary | ICD-10-CM | POA: Diagnosis not present

## 2019-03-03 DIAGNOSIS — E559 Vitamin D deficiency, unspecified: Secondary | ICD-10-CM | POA: Diagnosis not present

## 2019-03-03 DIAGNOSIS — N951 Menopausal and female climacteric states: Secondary | ICD-10-CM | POA: Diagnosis not present

## 2019-03-03 DIAGNOSIS — M810 Age-related osteoporosis without current pathological fracture: Secondary | ICD-10-CM | POA: Diagnosis not present

## 2019-03-03 DIAGNOSIS — K739 Chronic hepatitis, unspecified: Secondary | ICD-10-CM | POA: Diagnosis not present

## 2019-03-03 DIAGNOSIS — D471 Chronic myeloproliferative disease: Secondary | ICD-10-CM | POA: Diagnosis not present

## 2019-03-03 DIAGNOSIS — F411 Generalized anxiety disorder: Secondary | ICD-10-CM | POA: Diagnosis not present

## 2019-03-03 MED FILL — VIT D2 1.25 MG (50,000 UNIT: 1.25 MG | 84 days supply | Qty: 12 | Fill #0

## 2019-03-03 MED FILL — SYNTHROID 75 MCG TABLET: 75 | 90 days supply | Qty: 90 | Fill #0

## 2019-03-08 DIAGNOSIS — K7460 Unspecified cirrhosis of liver: Secondary | ICD-10-CM | POA: Diagnosis not present

## 2019-03-08 DIAGNOSIS — K769 Liver disease, unspecified: Secondary | ICD-10-CM | POA: Diagnosis not present

## 2019-03-08 MED FILL — FOLIC ACID 1 MG TABS: 1 | 30 days supply | Qty: 30 | Fill #4

## 2019-03-08 MED FILL — ESTRADIOL 1 MG TABLET: 1 | 30 days supply | Qty: 30 | Fill #1

## 2019-03-15 ENCOUNTER — Other Ambulatory Visit (HOSPITAL_COMMUNITY): Payer: Self-pay | Admitting: *Deleted

## 2019-03-15 MED FILL — VASCEPA 1 GM CAPSULE: 1 | 90 days supply | Qty: 360 | Fill #0

## 2019-03-15 MED FILL — ROSUVASTATIN CALCIUM 40 MG: 40 | 90 days supply | Qty: 90 | Fill #1

## 2019-03-16 ENCOUNTER — Other Ambulatory Visit: Payer: Self-pay

## 2019-03-16 ENCOUNTER — Inpatient Hospital Stay (HOSPITAL_COMMUNITY): Payer: 59 | Attending: Hematology

## 2019-03-16 DIAGNOSIS — E079 Disorder of thyroid, unspecified: Secondary | ICD-10-CM | POA: Insufficient documentation

## 2019-03-16 DIAGNOSIS — D696 Thrombocytopenia, unspecified: Secondary | ICD-10-CM | POA: Diagnosis not present

## 2019-03-16 DIAGNOSIS — E870 Hyperosmolality and hypernatremia: Secondary | ICD-10-CM | POA: Diagnosis not present

## 2019-03-16 DIAGNOSIS — C52 Malignant neoplasm of vagina: Secondary | ICD-10-CM | POA: Diagnosis not present

## 2019-03-16 DIAGNOSIS — B1920 Unspecified viral hepatitis C without hepatic coma: Secondary | ICD-10-CM | POA: Insufficient documentation

## 2019-03-16 DIAGNOSIS — K7689 Other specified diseases of liver: Secondary | ICD-10-CM | POA: Diagnosis not present

## 2019-03-16 DIAGNOSIS — Z9481 Bone marrow transplant status: Secondary | ICD-10-CM | POA: Diagnosis not present

## 2019-03-16 DIAGNOSIS — K7460 Unspecified cirrhosis of liver: Secondary | ICD-10-CM | POA: Diagnosis not present

## 2019-03-16 DIAGNOSIS — Z79899 Other long term (current) drug therapy: Secondary | ICD-10-CM | POA: Diagnosis not present

## 2019-03-16 DIAGNOSIS — K1370 Unspecified lesions of oral mucosa: Secondary | ICD-10-CM | POA: Diagnosis not present

## 2019-03-16 DIAGNOSIS — Z85810 Personal history of malignant neoplasm of tongue: Secondary | ICD-10-CM | POA: Insufficient documentation

## 2019-03-16 DIAGNOSIS — R7301 Impaired fasting glucose: Secondary | ICD-10-CM | POA: Diagnosis not present

## 2019-03-16 DIAGNOSIS — M818 Other osteoporosis without current pathological fracture: Secondary | ICD-10-CM | POA: Diagnosis not present

## 2019-03-16 DIAGNOSIS — Z856 Personal history of leukemia: Secondary | ICD-10-CM | POA: Diagnosis not present

## 2019-03-16 DIAGNOSIS — D6959 Other secondary thrombocytopenia: Secondary | ICD-10-CM | POA: Diagnosis not present

## 2019-03-16 DIAGNOSIS — K219 Gastro-esophageal reflux disease without esophagitis: Secondary | ICD-10-CM | POA: Diagnosis not present

## 2019-03-16 LAB — CBC WITH DIFFERENTIAL/PLATELET
Abs Immature Granulocytes: 0.06 10*3/uL (ref 0.00–0.07)
Basophils Absolute: 0.1 10*3/uL (ref 0.0–0.1)
Basophils Relative: 1 %
Eosinophils Absolute: 0.1 10*3/uL (ref 0.0–0.5)
Eosinophils Relative: 1 %
HCT: 39.2 % (ref 36.0–46.0)
Hemoglobin: 13.4 g/dL (ref 12.0–15.0)
Immature Granulocytes: 1 %
Lymphocytes Relative: 27 %
Lymphs Abs: 2.3 10*3/uL (ref 0.7–4.0)
MCH: 33.7 pg (ref 26.0–34.0)
MCHC: 34.2 g/dL (ref 30.0–36.0)
MCV: 98.5 fL (ref 80.0–100.0)
Monocytes Absolute: 0.7 10*3/uL (ref 0.1–1.0)
Monocytes Relative: 8 %
Neutro Abs: 5.3 10*3/uL (ref 1.7–7.7)
Neutrophils Relative %: 62 %
Platelets: 169 10*3/uL (ref 150–400)
RBC: 3.98 MIL/uL (ref 3.87–5.11)
RDW: 11.9 % (ref 11.5–15.5)
WBC: 8.5 10*3/uL (ref 4.0–10.5)
nRBC: 0 % (ref 0.0–0.2)

## 2019-03-16 LAB — COMPREHENSIVE METABOLIC PANEL
ALT: 40 U/L (ref 0–44)
AST: 39 U/L (ref 15–41)
Albumin: 4.3 g/dL (ref 3.5–5.0)
Alkaline Phosphatase: 55 U/L (ref 38–126)
Anion gap: 12 (ref 5–15)
BUN: 23 mg/dL — ABNORMAL HIGH (ref 6–20)
CO2: 21 mmol/L — ABNORMAL LOW (ref 22–32)
Calcium: 9.7 mg/dL (ref 8.9–10.3)
Chloride: 94 mmol/L — ABNORMAL LOW (ref 98–111)
Creatinine, Ser: 0.64 mg/dL (ref 0.44–1.00)
GFR calc Af Amer: >60 mL/min (ref >60)
GFR calc non Af Amer: >60 mL/min (ref >60)
Glucose, Bld: 107 mg/dL — ABNORMAL HIGH (ref 70–99)
Potassium: 4 mmol/L (ref 3.5–5.1)
Sodium: 127 mmol/L — ABNORMAL LOW (ref 135–145)
Total Bilirubin: 0.6 mg/dL (ref 0.3–1.2)
Total Protein: 7.6 g/dL (ref 6.5–8.1)

## 2019-03-18 ENCOUNTER — Inpatient Hospital Stay (HOSPITAL_COMMUNITY): Payer: 59

## 2019-03-22 MED FILL — OLMESARTAN MEDOXOMIL 20 MG: 20 | 30 days supply | Qty: 30 | Fill #0

## 2019-03-22 MED FILL — PROGESTERONE 100 MG CAPSULE: 100 | 30 days supply | Qty: 15 | Fill #1

## 2019-03-23 ENCOUNTER — Inpatient Hospital Stay (HOSPITAL_COMMUNITY): Payer: 59 | Admitting: Nurse Practitioner

## 2019-03-23 ENCOUNTER — Other Ambulatory Visit: Payer: Self-pay

## 2019-03-23 DIAGNOSIS — D696 Thrombocytopenia, unspecified: Secondary | ICD-10-CM | POA: Diagnosis not present

## 2019-03-23 DIAGNOSIS — Z85810 Personal history of malignant neoplasm of tongue: Secondary | ICD-10-CM | POA: Diagnosis not present

## 2019-03-23 DIAGNOSIS — Z856 Personal history of leukemia: Secondary | ICD-10-CM | POA: Diagnosis not present

## 2019-03-23 DIAGNOSIS — B1920 Unspecified viral hepatitis C without hepatic coma: Secondary | ICD-10-CM | POA: Diagnosis not present

## 2019-03-23 DIAGNOSIS — E079 Disorder of thyroid, unspecified: Secondary | ICD-10-CM | POA: Diagnosis not present

## 2019-03-23 DIAGNOSIS — K7460 Unspecified cirrhosis of liver: Secondary | ICD-10-CM | POA: Diagnosis not present

## 2019-03-23 DIAGNOSIS — D6959 Other secondary thrombocytopenia: Secondary | ICD-10-CM | POA: Diagnosis not present

## 2019-03-23 DIAGNOSIS — Z79899 Other long term (current) drug therapy: Secondary | ICD-10-CM | POA: Diagnosis not present

## 2019-03-23 DIAGNOSIS — Z9481 Bone marrow transplant status: Secondary | ICD-10-CM | POA: Diagnosis not present

## 2019-03-23 NOTE — Progress Notes (Signed)
Latoya Bautista, [place] [id number]   CLINIC:  Medical Oncology/Hematology  PCP:  [name], MD [address] [name] [address] [phone]   REASON FOR VISIT: Follow-up for thrombocytopenia secondary to cirrhosis of the liver  CURRENT THERAPY: Observation    INTERVAL HISTORY:  Latoya Bautista 48 y.o. female returns for routine follow-up for thrombocytopenia secondary to cirrhosis of the liver. She is doing well since her last appointment. Denies any nausea, vomiting, or diarrhea. Denies any new pains. Had not noticed any recent bleeding such as epistaxis, hematuria or hematochezia. Denies recent chest pain on exertion, shortness of breath on minimal exertion, pre-syncopal episodes, or palpitations. Denies any numbness or tingling in hands or feet. Denies any recent fevers, infections, or recent hospitalizations. Patient reports appetite at 100% and energy level at 100%. She is eating well and maintaining her weight at this time.     REVIEW OF SYSTEMS:  Review of Systems  All other systems reviewed and are negative.    PAST MEDICAL/SURGICAL HISTORY:  Past Medical History:  Diagnosis Date  . Anemia   . Cancer (HCC)    tongue cancer  . H/O echocardiogram    a. 07/2017: echo showing EF of 55-60%, Grade 1 DD, and no significant valve abnormalities.   . Hepatitis C   . Hepatitis C 04/11/2011  . Leukemia ([place])   . Liver cirrhosis ([place]) 04/11/2011  . Osteoporosis   . Seizures ([place])   . Shingles   . Thrombocytopenia ([place]) 04/11/2011  . Thyroid disease    Past Surgical History:  Procedure Laterality Date  . BONE MARROW BIOPSY  05/2011  . BONE MARROW TRANSPLANT    . CHOLECYSTECTOMY    . liver biopsie    . TONGUE BIOPSY    . tongue cancer     surgical removal of area     SOCIAL HISTORY:  Social History   Socioeconomic History  . Marital status: Married    Spouse name: Shanon Brow  . Number of children: 0  . Years of education: 12th   . Highest education level: Not on file  Occupational History    Employer: [employer]  . Financial resource strain: Not on file  . Food insecurity    Worry: Not on file    Inability: Not on file  . Transportation needs    Medical: Not on file    Non-medical: Not on file  Tobacco Use  . Smoking status: Never Smoker  . Smokeless tobacco: Never Used  Substance and Sexual Activity  . Alcohol use: No  . Drug use: No  . Sexual activity: Never    Birth control/protection: None, Post-menopausal  Lifestyle  . Physical activity    Days per week: Not on file    Minutes per session: Not on file  . Stress: Not on file  Relationships  . Social Herbalist on phone: Not on file    Gets together: Not on file    Attends religious service: Not on file    Active member of club or organization: Not on file    Attends meetings of clubs or organizations: Not on file    Relationship status: Not on file  . Intimate partner violence    Fear of current or ex partner: Not on file    Emotionally abused: Not on file    Physically abused: Not on file    Forced sexual activity:  Not on file  Other Topics Concern  . Not on file  Social History Narrative   Patient lives at home with her spouse.   Caffeine Use: 16oz bottle of soda daily    FAMILY HISTORY:  Family History  Problem Relation Age of Onset  . Hypertension Father   . Diabetes Maternal Uncle     CURRENT MEDICATIONS:  Outpatient Encounter Medications as of 03/23/2019  Medication Sig Note  . carbamazepine (TEGRETOL XR) 400 MG 12 hr tablet carbamazepine ER 400 mg tablet,extended release,12 hr   . cholecalciferol (VITAMIN D) 1000 units tablet Take 1,000 Units by mouth daily.   Marland Kitchen DEXILANT 60 MG capsule Take 1 capsule by mouth daily.   . Ergocalciferol (VITAMIN D2 PO) Take 1.25 mg by mouth.   . estradiol (ESTRACE) 1 MG tablet TAKE 1 TABLET BY MOUTH DAILY.   . folic acid (FOLVITE) 1 MG tablet Take 1  tablet (1 mg total) by mouth daily.   . Garlic 2952 MG CAPS Take 1 capsule by mouth daily.   Marland Kitchen levothyroxine (SYNTHROID, LEVOTHROID) 75 MCG tablet Take 75 mcg by mouth daily before breakfast.    . metoprolol tartrate (LOPRESSOR) 25 MG tablet Take 1 tablet (25 mg total) by mouth 2 (two) times daily.   . Multiple Vitamins-Minerals (CENTRUM PO) Take 1 tablet by mouth daily.     Marland Kitchen olmesartan (BENICAR) 20 MG tablet Take 20 mg by mouth daily.   . progesterone (PROMETRIUM) 100 MG capsule TAKE 1 CAPSULE BY MOUTH DAILY FOR 15 DAYS PER MONTH   . rosuvastatin (CRESTOR) 40 MG tablet Take 40 mg by mouth every evening.  03/17/2015: Received from: [facility]: Take 40 mg by mouth.  Marland Kitchen VASCEPA 1 g CAPS TAKE 2 CAPSULES BY MOUTH 2 TIMES A DAY   . vitamin B-12 (CYANOCOBALAMIN) 1000 MCG tablet Take 1,000 mcg by mouth daily.   . vitamin C (ASCORBIC ACID) 500 MG tablet Take 500 mg by mouth daily.    . vitamin E 400 UNIT capsule Take 400 Units by mouth 2 (two) times daily.   . zoledronic acid (RECLAST) 5 MG/100ML SOLN Inject 5 mg into the vein once. Once yearly   . fexofenadine (ALLEGRA) 180 MG tablet Take 180 mg by mouth daily as needed. allergies   . [DISCONTINUED] ergocalciferol (VITAMIN D2) 1.25 MG (50000 UT) capsule Take by mouth.    No facility-administered encounter medications on file as of 03/23/2019.     ALLERGIES:  Allergies  Allergen Reactions  . Simvastatin Rash  . Briviact [Brivaracetam]     Elevated LFT's  . Keppra [Levetiracetam]     Rash  . Asparaginase Derivatives Rash  . Elspar [Asparaginase] Rash  . Soap Rash    Procedure prep soap     PHYSICAL EXAM:  ECOG Performance status: 1  Vitals:   03/23/19 0818  BP: 140/74  Pulse: 83  Resp: 16  Temp: 97.9 F (36.6 C)  SpO2: 99%   Filed Weights   03/23/19 0818  Weight: 126 lb 9.6 oz (57.4 kg)    Physical Exam Constitutional:      Appearance: Normal appearance. She is normal weight.  Cardiovascular:     Rate  and Rhythm: Normal rate and regular rhythm.     Heart sounds: Normal heart sounds.  Pulmonary:     Effort: Pulmonary effort is normal.     Breath sounds: Normal breath sounds.  Abdominal:     General: Bowel sounds are normal.  Palpations: Abdomen is soft.  Musculoskeletal: Normal range of motion.  Skin:    General: Skin is warm.  Neurological:     Mental Status: She is alert and oriented to person, place, and time. Mental status is at baseline.  Psychiatric:        Mood and Affect: Mood normal.        Behavior: Behavior normal.        Thought Content: Thought content normal.        Judgment: Judgment normal.      LABORATORY DATA:  I have reviewed the labs as listed.  CBC    Component Value Date/Time   WBC 8.5 03/16/2019 1344   RBC 3.98 03/16/2019 1344   HGB 13.4 03/16/2019 1344   HCT 39.2 03/16/2019 1344   PLT 169 03/16/2019 1344   MCV 98.5 03/16/2019 1344   MCH 33.7 03/16/2019 1344   MCHC 34.2 03/16/2019 1344   RDW 11.9 03/16/2019 1344   LYMPHSABS 2.3 03/16/2019 1344   MONOABS 0.7 03/16/2019 1344   EOSABS 0.1 03/16/2019 1344   BASOSABS 0.1 03/16/2019 1344   CMP Latest Ref Rng & Units 03/16/2019 09/01/2018 03/17/2018  Glucose 70 - 99 mg/dL 107(H) - 106(H)  BUN 6 - 20 mg/dL 23(H) - 10  Creatinine 0.44 - 1.00 mg/dL 0.64 0.70 0.70  Sodium 135 - 145 mmol/L 127(L) - 137  Potassium 3.5 - 5.1 mmol/L 4.0 - 4.2  Chloride 98 - 111 mmol/L 94(L) - 104  CO2 22 - 32 mmol/L 21(L) - 24  Calcium 8.9 - 10.3 mg/dL 9.7 - 9.8  Total Protein 6.5 - 8.1 g/dL 7.6 - 8.0  Total Bilirubin 0.3 - 1.2 mg/dL 0.6 - 0.7  Alkaline Phos 38 - 126 U/L 55 - 50  AST 15 - 41 U/L 39 - 43(H)  ALT 0 - 44 U/L 40 - 39    I personally performed a face-to-face visit.  All questions were answered to patient's stated satisfaction. Encouraged patient to call with any new concerns or questions before his next visit to the cancer center and we can certain see him sooner, if needed.     ASSESSMENT & PLAN:    Thrombocytopenia 1. Thrombocytopenia secondary to cirrhosis of the liver: -She has a history of hepatitis C in remission. She is no longer on antiviral therapy. She completed 44 weeks of telaprevir triple based therapy for the treatment of her hepatitis C. -Completion date was January 2012. Treatment was discontinued 4 weeks early due to worsening anemia. -Her HCV RNA 6 months post treatment remain undetectable confirming SVR. -HCV RNA level on 11/04/2012 continue to validate SVR. -Her hepatologist is Dr. Legrand Como fried at [facility]. 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 She denies any further treatment since her last pulmonary transplant. -Platelets dating back to 2016 shows no evidence of thrombocytopenia. -Labs done on 03/16/2019 showed her platelet counts 169 -She will follow-up in 1 year with repeat labs.      Orders placed this encounter:  Orders Placed This Encounter  Procedures  . Lactate dehydrogenase  . CBC with Differential/Platelet  . Comprehensive metabolic panel  . Vitamin B12  . Vitamin D 25 hydroxy     Francene Finders, FNP-C [facility] [phone]

## 2019-03-23 NOTE — Assessment & Plan Note (Signed)
1. Thrombocytopenia secondary to cirrhosis of the liver: -She has a history of hepatitis C in remission. She is no longer on antiviral therapy. She completed 44 weeks of telaprevir triple based therapy for the treatment of her hepatitis C. -Completion date was January 2012. Treatment was discontinued 4 weeks early due to worsening anemia. -Her HCV RNA 6 months post treatment remain undetectable confirming SVR. -HCV RNA level on 11/04/2012 continue to validate SVR. -Her hepatologist is Dr. Legrand Como fried at [facility]. 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 She denies any further treatment since her last pulmonary transplant. -Platelets dating back to 2016 shows no evidence of thrombocytopenia. -Labs done on 03/16/2019 showed her platelet counts 169 -She will follow-up in 1 year with repeat labs.

## 2019-03-29 MED FILL — CARBAMAZEPINE ER 400 MG TAB: 400 | 90 days supply | Qty: 180 | Fill #1

## 2019-04-05 ENCOUNTER — Other Ambulatory Visit: Payer: Self-pay | Admitting: Cardiology

## 2019-04-05 ENCOUNTER — Other Ambulatory Visit (HOSPITAL_COMMUNITY): Payer: Self-pay | Admitting: Internal Medicine

## 2019-04-05 DIAGNOSIS — K7460 Unspecified cirrhosis of liver: Secondary | ICD-10-CM

## 2019-04-05 DIAGNOSIS — D696 Thrombocytopenia, unspecified: Secondary | ICD-10-CM

## 2019-04-05 MED FILL — METOPROLOL TARTRATE 25 MG T: 25 | 90 days supply | Qty: 180 | Fill #0

## 2019-04-05 MED FILL — ESTRADIOL 1 MG TABLET: 1 | 30 days supply | Qty: 30 | Fill #2

## 2019-04-05 MED FILL — FOLIC ACID 1 MG TABS: 1 | 30 days supply | Qty: 30 | Fill #0

## 2019-04-05 NOTE — Telephone Encounter (Signed)
Refill request

## 2019-04-12 ENCOUNTER — Other Ambulatory Visit: Payer: Self-pay | Admitting: Cardiology

## 2019-04-19 MED FILL — OLMESARTAN MEDOXOMIL 20 MG: 20 | 30 days supply | Qty: 30 | Fill #1

## 2019-04-19 MED FILL — PROGESTERONE 100 MG CAPSULE: 100 | 30 days supply | Qty: 15 | Fill #2

## 2019-05-03 MED FILL — FOLIC ACID 1 MG TABS: 1 | 30 days supply | Qty: 30 | Fill #1

## 2019-05-03 MED FILL — ESTRADIOL 1 MG TABLET: 1 | 30 days supply | Qty: 30 | Fill #3

## 2019-05-07 DIAGNOSIS — C021 Malignant neoplasm of border of tongue: Secondary | ICD-10-CM

## 2019-05-07 HISTORY — DX: Malignant neoplasm of border of tongue: C02.1

## 2019-05-10 MED FILL — DEXILANT DR 60 MG CAPSULE: 60 | 90 days supply | Qty: 90 | Fill #0

## 2019-05-10 MED FILL — VIT D2 1.25 MG (50,000 UNIT: 1.25 MG | 84 days supply | Qty: 12 | Fill #1

## 2019-05-20 DIAGNOSIS — R002 Palpitations: Secondary | ICD-10-CM | POA: Diagnosis not present

## 2019-05-20 DIAGNOSIS — B182 Chronic viral hepatitis C: Secondary | ICD-10-CM | POA: Diagnosis not present

## 2019-05-20 DIAGNOSIS — G40309 Generalized idiopathic epilepsy and epileptic syndromes, not intractable, without status epilepticus: Secondary | ICD-10-CM | POA: Diagnosis not present

## 2019-05-20 DIAGNOSIS — R000 Tachycardia, unspecified: Secondary | ICD-10-CM | POA: Diagnosis not present

## 2019-05-20 DIAGNOSIS — R21 Rash and other nonspecific skin eruption: Secondary | ICD-10-CM | POA: Diagnosis not present

## 2019-05-20 DIAGNOSIS — Z6825 Body mass index (BMI) 25.0-25.9, adult: Secondary | ICD-10-CM | POA: Diagnosis not present

## 2019-05-20 DIAGNOSIS — G4089 Other seizures: Secondary | ICD-10-CM | POA: Diagnosis not present

## 2019-05-20 DIAGNOSIS — G40A09 Absence epileptic syndrome, not intractable, without status epilepticus: Secondary | ICD-10-CM | POA: Diagnosis not present

## 2019-05-24 MED FILL — PROGESTERONE 100 MG CAPSULE: 100 | 30 days supply | Qty: 15 | Fill #3

## 2019-05-24 MED FILL — OLMESARTAN MEDOXOMIL 20 MG: 20 | 30 days supply | Qty: 30 | Fill #2

## 2019-05-27 DIAGNOSIS — E782 Mixed hyperlipidemia: Secondary | ICD-10-CM | POA: Diagnosis not present

## 2019-05-27 DIAGNOSIS — I9589 Other hypotension: Secondary | ICD-10-CM | POA: Diagnosis not present

## 2019-05-27 DIAGNOSIS — R000 Tachycardia, unspecified: Secondary | ICD-10-CM | POA: Diagnosis not present

## 2019-05-27 DIAGNOSIS — K7460 Unspecified cirrhosis of liver: Secondary | ICD-10-CM | POA: Diagnosis not present

## 2019-05-27 DIAGNOSIS — E039 Hypothyroidism, unspecified: Secondary | ICD-10-CM | POA: Diagnosis not present

## 2019-05-27 DIAGNOSIS — R7301 Impaired fasting glucose: Secondary | ICD-10-CM | POA: Diagnosis not present

## 2019-05-27 DIAGNOSIS — B182 Chronic viral hepatitis C: Secondary | ICD-10-CM | POA: Diagnosis not present

## 2019-05-27 DIAGNOSIS — D696 Thrombocytopenia, unspecified: Secondary | ICD-10-CM | POA: Diagnosis not present

## 2019-05-27 DIAGNOSIS — E870 Hyperosmolality and hypernatremia: Secondary | ICD-10-CM | POA: Diagnosis not present

## 2019-05-31 MED FILL — ESTRADIOL 1 MG TABLET: 1 | 30 days supply | Qty: 30 | Fill #4

## 2019-05-31 MED FILL — FOLIC ACID 1 MG TABS: 1 | 30 days supply | Qty: 30 | Fill #2

## 2019-06-14 MED FILL — VASCEPA 1 GM CAPSULE: 1 | 90 days supply | Qty: 360 | Fill #0

## 2019-06-15 ENCOUNTER — Emergency Department (HOSPITAL_COMMUNITY)
Admission: EM | Admit: 2019-06-15 | Discharge: 2019-06-15 | Disposition: A | Discharge status: Home / Self Care | Payer: 59 | Attending: Emergency Medicine | Admitting: Emergency Medicine

## 2019-06-15 ENCOUNTER — Emergency Department (HOSPITAL_COMMUNITY): Payer: 59

## 2019-06-15 ENCOUNTER — Other Ambulatory Visit: Payer: Self-pay

## 2019-06-15 ENCOUNTER — Encounter (HOSPITAL_COMMUNITY): Payer: Self-pay | Admitting: Emergency Medicine

## 2019-06-15 DIAGNOSIS — Y990 Civilian activity done for income or pay: Secondary | ICD-10-CM | POA: Diagnosis not present

## 2019-06-15 DIAGNOSIS — M79671 Pain in right foot: Secondary | ICD-10-CM | POA: Diagnosis not present

## 2019-06-15 DIAGNOSIS — M79672 Pain in left foot: Secondary | ICD-10-CM | POA: Insufficient documentation

## 2019-06-15 DIAGNOSIS — Y9301 Activity, walking, marching and hiking: Secondary | ICD-10-CM | POA: Diagnosis not present

## 2019-06-15 DIAGNOSIS — Y929 Unspecified place or not applicable: Secondary | ICD-10-CM | POA: Insufficient documentation

## 2019-06-15 MED ORDER — DICLOFENAC SODIUM 1 % EX GEL: 2.0000 g | Freq: Four times a day (QID) | CUTANEOUS | 0 refills | Status: DC

## 2019-06-15 MED FILL — DICLOFENAC SODIUM 1 % GEL: 1 | 12 days supply | Qty: 100 | Fill #0

## 2019-06-15 NOTE — ED Triage Notes (Signed)
Pt reports right foot pain. Pt reports history of "stretching ligaments" in left foot and reports pain feels the same. Pt reports went to reach for something while at work and reports felt something "pull" in right foot. Pt able to bear weight but with discomfort.

## 2019-06-15 NOTE — Discharge Instructions (Addendum)
There is no evidence for obvious acute fracture or other injury in your foot.  However,  as your symptoms are similar to your prior stress fracture in your left foot, you have been placed in a splint to protect you in case this is occurring in your right foot.  Elevate as much as possible, use ice as is comfortable for the next several days to minimize any swelling.  Plan to see your podiatrist for a recheck of your symptoms if these treatments do not completely eliminate your symptoms.

## 2019-06-16 ENCOUNTER — Encounter: Payer: Self-pay | Admitting: Podiatry

## 2019-06-16 ENCOUNTER — Ambulatory Visit: Payer: 59

## 2019-06-16 ENCOUNTER — Other Ambulatory Visit: Payer: Self-pay

## 2019-06-16 ENCOUNTER — Ambulatory Visit: Payer: 59 | Admitting: Podiatry

## 2019-06-16 DIAGNOSIS — M84375D Stress fracture, left foot, subsequent encounter for fracture with routine healing: Secondary | ICD-10-CM | POA: Diagnosis not present

## 2019-06-16 DIAGNOSIS — M84374G Stress fracture, right foot, subsequent encounter for fracture with delayed healing: Secondary | ICD-10-CM

## 2019-06-16 NOTE — ED Provider Notes (Signed)
[facility] Provider Note   CSN: [id number] Arrival date & time: 06/15/19  1333     History Chief Complaint  Patient presents with  . Foot Pain    Latoya Bautista is a 49 y.o. female.  The history is provided by the patient.  Foot Pain This is a new problem. The current episode started 1 to 2 hours ago (feels a stretching like pain in her right midfoot since walking at work today. reminds her of the pain she had with a stress fracture of the left foot. ). Episode frequency: pain with weight bearing. The symptoms are aggravated by standing and walking. Relieved by: rest. She has tried nothing for the symptoms.       Past Medical History:  Diagnosis Date  . Anemia   . Cancer (HCC)    tongue cancer  . H/O echocardiogram    a. 07/2017: echo showing EF of 55-60%, Grade 1 DD, and no significant valve abnormalities.   . Hepatitis C   . Hepatitis C 04/11/2011  . Leukemia ([place])   . Liver cirrhosis ([place]) 04/11/2011  . Osteoporosis   . Seizures ([place])   . Shingles   . Thrombocytopenia ([place]) 04/11/2011  . Thyroid disease     Patient Active Problem List   Diagnosis Date Noted  . Melanoma in situ of skin of buttock ([place]) 08/27/2018  . Vulvar lesion 06/09/2018  . History of thrombocytopenia 12/11/2015  . Well woman exam with routine gynecological exam 10/18/2013  . Localization-related epilepsy ([place]) 01/01/2013  . Partial epilepsy with impairment of consciousness ([place]) 12/15/2012  . Postmenopausal bleeding DUE TO hormone tx. 10/08/2012  . Hepatitis C 04/11/2011  . Liver cirrhosis ([place]) 04/11/2011  . Thrombocytopenia ([place]) 04/11/2011    Past Surgical History:  Procedure Laterality Date  . BONE MARROW BIOPSY  05/2011  . BONE MARROW TRANSPLANT    . CHOLECYSTECTOMY    . liver biopsie    . TONGUE BIOPSY    . tongue cancer     surgical removal of area     OB History   No obstetric history on file.     Family History  Problem Relation Age of Onset  .  Hypertension Father   . Diabetes Maternal Uncle     Social History   Tobacco Use  . Smoking status: Never Smoker  . Smokeless tobacco: Never Used  Substance Use Topics  . Alcohol use: No  . Drug use: No    Home Medications Prior to Admission medications   Medication Sig Start Date End Date Taking? Authorizing Provider  carbamazepine (TEGRETOL XR) 400 MG 12 hr tablet carbamazepine ER 400 mg tablet,extended release,12 hr 10/20/18   [provider]  cholecalciferol (VITAMIN D) 1000 units tablet Take 1,000 Units by mouth daily.    [provider]  DEXILANT 60 MG capsule Take 1 capsule by mouth daily. 02/16/18   [provider]  diclofenac Sodium (VOLTAREN) 1 % GEL Apply 2 g topically 4 (four) times daily. 06/15/19   Evalee Jefferson, PA-C  Ergocalciferol (VITAMIN D2 PO) Take 1.25 mg by mouth.    [provider]  estradiol (ESTRACE) 1 MG tablet TAKE 1 TABLET BY MOUTH DAILY. 02/04/19   [name], MD  fexofenadine (ALLEGRA) 180 MG tablet Take 180 mg by mouth daily as needed. allergies 09/20/10   [provider]  folic acid (FOLVITE) 1 MG tablet TAKE 1 TABLET (1 MG TOTAL) BY MOUTH DAILY. 04/05/19   Francene Finders  L, NP-C  Garlic 5093 MG CAPS Take 1 capsule by mouth daily.    [provider]  levothyroxine (SYNTHROID, LEVOTHROID) 75 MCG tablet Take 75 mcg by mouth daily before breakfast.     [provider]  metoprolol tartrate (LOPRESSOR) 25 MG tablet TAKE 1 TABLET BY MOUTH TWICE A DAY 04/12/19   Arnoldo Lenis, MD  Multiple Vitamins-Minerals (CENTRUM PO) Take 1 tablet by mouth daily.      [provider]  olmesartan (BENICAR) 20 MG tablet Take 20 mg by mouth daily.    [provider]  progesterone (PROMETRIUM) 100 MG capsule TAKE 1 CAPSULE BY MOUTH DAILY FOR 15 DAYS PER MONTH 02/15/19   [name], MD  rosuvastatin (CRESTOR) 40 MG tablet Take 40 mg by mouth every evening.     [provider]    VASCEPA 1 g CAPS TAKE 2 CAPSULES BY MOUTH 2 TIMES A DAY 01/12/18   [provider]  vitamin B-12 (CYANOCOBALAMIN) 1000 MCG tablet Take 1,000 mcg by mouth daily.    [provider]  vitamin C (ASCORBIC ACID) 500 MG tablet Take 500 mg by mouth daily.  07/18/10   [provider]  vitamin E 400 UNIT capsule Take 400 Units by mouth 2 (two) times daily.    [provider]  zoledronic acid (RECLAST) 5 MG/100ML SOLN Inject 5 mg into the vein once. Once yearly    [provider]    Allergies    Simvastatin, Briviact [brivaracetam], Keppra [levetiracetam], Asparaginase derivatives, Elspar [asparaginase], and Soap  Review of Systems   Review of Systems  Constitutional: Negative for fever.  Musculoskeletal: Positive for arthralgias. Negative for joint swelling and myalgias.  Neurological: Negative for weakness and numbness.    Physical Exam Updated Vital Signs BP (!) 162/82   Pulse (!) 102   Temp 98.6 F (37 C)   Resp 18   Ht '4\' 11"'$  (1.499 m)   Wt 57.2 kg   SpO2 99%   BMI 25.45 kg/m   Physical Exam Constitutional:      Appearance: She is well-developed.  HENT:     Head: Atraumatic.  Cardiovascular:     Comments: Pulses equal bilaterally Musculoskeletal:        General: Tenderness present.     Cervical back: Normal range of motion.     Right foot: Normal range of motion and normal capillary refill. Bunion and bony tenderness present. No swelling, deformity or crepitus. Normal pulse.     Comments: ttp dorsal right midfoot without edema or erythema.  Distal sensation intact.   Skin:    General: Skin is warm and dry.  Neurological:     Mental Status: She is alert.     Sensory: No sensory deficit.     Deep Tendon Reflexes: Reflexes normal.     ED Results / Procedures / Treatments   Labs (all labs ordered are listed, but only abnormal results are displayed) Labs Reviewed - No data to display  EKG None  Radiology DG Foot Complete  Right  Result Date: 06/15/2019 CLINICAL DATA:  Right foot pain. Felt a pull in right foot when reaching for something. EXAM: RIGHT FOOT COMPLETE - 3+ VIEW COMPARISON:  No prior right foot exam FINDINGS: Hallux valgus with minimal degenerative change of the first metatarsal phalangeal joint. No acute fracture or dislocation. Small accessory os navicular. Small plantar calcaneal spur. No erosions or bony destructive change. No focal soft tissue abnormality. IMPRESSION: Hallux valgus with minimal  degenerative change of the first metatarsophalangeal joint. No acute abnormality. Electronically Signed   By: Keith Rake M.D.   On: 06/15/2019 14:09    Procedures Procedures (including critical care time)  Medications Ordered in ED Medications - No data to display  ED Course  I have reviewed the triage vital signs and the nursing notes.  Pertinent labs & imaging results that were available during my care of the patient were reviewed by me and considered in my medical decision making (see chart for details).    MDM Rules/Calculators/A&P                     xrays reviewed and interpreted, discussed with pt.  No obvious fracture.  She was placed in a walking shoe, discssed role of ice/heat and careful weight bearing. Trial of voltaren gel topical for pain relief.  She has seen podiatry in the past, advised recheck with this specialist if sx not improving with this tx plan.  Final Clinical Impression(s) / ED Diagnoses Final diagnoses:  Foot pain, right    Rx / DC Orders ED Discharge Orders         Ordered    diclofenac Sodium (VOLTAREN) 1 % GEL  4 times daily     06/15/19 1616           Landis Martins 06/16/19 1705    Nat Christen, MD 06/17/19 [phone]

## 2019-06-17 ENCOUNTER — Encounter: Payer: Self-pay | Admitting: Podiatry

## 2019-06-17 NOTE — Progress Notes (Signed)
Subjective:  Patient ID: Latoya Bautista, female    DOB: [birth year],  MRN: [id number]  Chief Complaint  Patient presents with  . Foot Pain    pt is here for right foot pain stress fracture. pt was seen yesterday at the ER. pt states that pain is elevated when she is not wearing the boot.    49 y.o. female presents with the above complaint.  Patient presents with right foot possible stress fracture.  Patient went to the emergency room yesterday who had x-rays done and are in epic.  The x-rays were reviewed by me.  Patient states that she is wearing a boot is helping but is still in pain.  Patient has been applying over-the-counter cream to the foot which has been helping slightly.  Her pain scale is 2 out of 10.  She was sent here to be evaluated by me to see if there was any further management needed.   Review of Systems: Negative except as noted in the HPI. Denies N/V/F/Ch.  Past Medical History:  Diagnosis Date  . Anemia   . Cancer (HCC)    tongue cancer  . H/O echocardiogram    a. 07/2017: echo showing EF of 55-60%, Grade 1 DD, and no significant valve abnormalities.   . Hepatitis C   . Hepatitis C 04/11/2011  . Leukemia ([place])   . Liver cirrhosis ([place]) 04/11/2011  . Osteoporosis   . Seizures ([place])   . Shingles   . Thrombocytopenia ([place]) 04/11/2011  . Thyroid disease     Current Outpatient Medications:  .  carbamazepine (TEGRETOL XR) 400 MG 12 hr tablet, carbamazepine ER 400 mg tablet,extended release,12 hr, Disp: , Rfl:  .  cholecalciferol (VITAMIN D) 1000 units tablet, Take 1,000 Units by mouth daily., Disp: , Rfl:  .  DEXILANT 60 MG capsule, Take 1 capsule by mouth daily., Disp: , Rfl: 0 .  diclofenac Sodium (VOLTAREN) 1 % GEL, Apply 2 g topically 4 (four) times daily., Disp: 100 g, Rfl: 0 .  Ergocalciferol (VITAMIN D2 PO), Take 1.25 mg by mouth., Disp: , Rfl:  .  estradiol (ESTRACE) 1 MG tablet, TAKE 1 TABLET BY MOUTH DAILY., Disp: 30 tablet, Rfl: 10 .  fexofenadine  (ALLEGRA) 180 MG tablet, Take 180 mg by mouth daily as needed. allergies, Disp: , Rfl:  .  folic acid (FOLVITE) 1 MG tablet, TAKE 1 TABLET (1 MG TOTAL) BY MOUTH DAILY., Disp: 30 tablet, Rfl: 4 .  Garlic 123XX123 MG CAPS, Take 1 capsule by mouth daily., Disp: , Rfl:  .  levothyroxine (SYNTHROID, LEVOTHROID) 75 MCG tablet, Take 75 mcg by mouth daily before breakfast. , Disp: , Rfl:  .  metoprolol tartrate (LOPRESSOR) 25 MG tablet, TAKE 1 TABLET BY MOUTH TWICE A DAY, Disp: 180 tablet, Rfl: 3 .  Multiple Vitamins-Minerals (CENTRUM PO), Take 1 tablet by mouth daily.  , Disp: , Rfl:  .  olmesartan (BENICAR) 20 MG tablet, Take 20 mg by mouth daily., Disp: , Rfl:  .  progesterone (PROMETRIUM) 100 MG capsule, TAKE 1 CAPSULE BY MOUTH DAILY FOR 15 DAYS PER MONTH, Disp: 15 capsule, Rfl: 5 .  rosuvastatin (CRESTOR) 40 MG tablet, Take 40 mg by mouth every evening. , Disp: , Rfl:  .  VASCEPA 1 g CAPS, TAKE 2 CAPSULES BY MOUTH 2 TIMES A DAY, Disp: , Rfl: 1 .  vitamin B-12 (CYANOCOBALAMIN) 1000 MCG tablet, Take 1,000 mcg by mouth daily., Disp: , Rfl:  .  vitamin C (ASCORBIC ACID) 500  MG tablet, Take 500 mg by mouth daily. , Disp: , Rfl:  .  vitamin E 400 UNIT capsule, Take 400 Units by mouth 2 (two) times daily., Disp: , Rfl:  .  zoledronic acid (RECLAST) 5 MG/100ML SOLN, Inject 5 mg into the vein once. Once yearly, Disp: , Rfl:   Social History   Tobacco Use  Smoking Status Never Smoker  Smokeless Tobacco Never Used    Allergies  Allergen Reactions  . Simvastatin Rash  . Briviact [Brivaracetam]     Elevated LFT's  . Keppra [Levetiracetam]     Rash  . Asparaginase Derivatives Rash  . Elspar [Asparaginase] Rash  . Soap Rash    Procedure prep soap   Objective:  There were no vitals filed for this visit. There is no height or weight on file to calculate BMI. Constitutional Well developed. Well nourished.  Vascular Dorsalis pedis pulses palpable bilaterally. Posterior tibial pulses palpable  bilaterally. Capillary refill normal to all digits.  No cyanosis or clubbing noted. Pedal hair growth normal.  Neurologic Normal speech. Oriented to person, place, and time. Epicritic sensation to light touch grossly present bilaterally.  Dermatologic Nails well groomed and normal in appearance. No open wounds. No skin lesions.  Orthopedic:  Pain on palpation to the right third metatarsal midshaft.  Mild pain at the head of the third metatarsal.  No pain with range of motion of the third metatarsal.  Mild pain in the second intermetatarsal space.  Negative Mulder's click however could be a masked due to the stress fracture.   Radiographs: Hallux valgus with minimal degenerative change of the first metatarsophalangeal joint. No acute abnormality.  Assessment:   1. Stress fracture of metatarsal bone of left foot with routine healing, subsequent encounter    Plan:  Patient was evaluated and treated and all questions answered.  Right third metatarsal stress fracture cannot rule out neuroma of the second interspace -I explained to the patient the etiology of her pain with the third metatarsal stress fracture even though was negative on x-ray.  I explained to the patient that he can take about 10 to 14 days and sometimes up to 3 weeks for it to be seen on an x-ray.  However given that patient is having significant improvement with a cam boot on.  I will have have the patient continue to wear the cam boot for next 4 weeks. -Work note was given to be taken out for work for next few weeks.  Return in about 4 weeks (around 07/14/2019).

## 2019-06-22 ENCOUNTER — Telehealth: Payer: Self-pay | Admitting: Podiatry

## 2019-06-22 NOTE — Telephone Encounter (Signed)
Dr. Posey Pronto, I am trying to work on your pts. FMLA forms and i'm confused as to which foot she is asking for FMLA for? Please advice so I can complete paperwork.

## 2019-06-22 NOTE — Telephone Encounter (Signed)
Hi Latoya Bautista,  My apologies.  It should be on the right foot.  I have addended my last note diagnosis.

## 2019-06-25 MED FILL — ROSUVASTATIN CALCIUM 40 MG: 40 | 90 days supply | Qty: 90 | Fill #0

## 2019-06-25 MED FILL — OLMESARTAN MEDOXOMIL 20 MG: 20 | 30 days supply | Qty: 30 | Fill #0

## 2019-06-25 MED FILL — PROGESTERONE 100 MG CAPSULE: 100 | 30 days supply | Qty: 15 | Fill #4

## 2019-06-25 MED FILL — SYNTHROID 75 MCG TABLET: 75 | 90 days supply | Qty: 90 | Fill #1

## 2019-06-25 MED FILL — CARBAMAZEPINE ER 400 MG TAB: 400 | 90 days supply | Qty: 180 | Fill #2

## 2019-07-05 MED FILL — METOPROLOL TARTRATE 25 MG T: 25 | 90 days supply | Qty: 180 | Fill #0

## 2019-07-05 MED FILL — FOLIC ACID 1 MG TABS: 1 | 30 days supply | Qty: 30 | Fill #3

## 2019-07-05 MED FILL — ESTRADIOL 1 MG TABLET: 1 | 30 days supply | Qty: 30 | Fill #5

## 2019-07-16 ENCOUNTER — Other Ambulatory Visit: Payer: Self-pay

## 2019-07-16 ENCOUNTER — Encounter: Payer: Self-pay | Admitting: Podiatry

## 2019-07-16 ENCOUNTER — Ambulatory Visit (INDEPENDENT_AMBULATORY_CARE_PROVIDER_SITE_OTHER): Payer: 59

## 2019-07-16 ENCOUNTER — Ambulatory Visit: Payer: 59 | Admitting: Podiatry

## 2019-07-16 DIAGNOSIS — G588 Other specified mononeuropathies: Secondary | ICD-10-CM | POA: Diagnosis not present

## 2019-07-16 DIAGNOSIS — M84374G Stress fracture, right foot, subsequent encounter for fracture with delayed healing: Secondary | ICD-10-CM | POA: Diagnosis not present

## 2019-07-16 DIAGNOSIS — T148XXA Other injury of unspecified body region, initial encounter: Secondary | ICD-10-CM

## 2019-07-19 ENCOUNTER — Encounter: Payer: Self-pay | Admitting: Podiatry

## 2019-07-19 MED FILL — PROGESTERONE 100 MG CAPSULE: 100 | 30 days supply | Qty: 15 | Fill #5

## 2019-07-19 MED FILL — OLMESARTAN MEDOXOMIL 20 MG: 20 | 30 days supply | Qty: 30 | Fill #1

## 2019-07-19 NOTE — Progress Notes (Signed)
Subjective:  Patient ID: Latoya Bautista, female    DOB: November 10, 1970,  MRN: [id number]  Chief Complaint  Patient presents with  . Foot Pain    pt is here for a f/u on stress fracture of the right foot, pt states she is still feeling pain since the last time she was here, pt states she has been wearing the boot as well.     49 y.o. female presents with the above complaint.  Patient is following up for right foot stress fracture that has not been ruled in with radiographic studies yet.  Patient states is still hurting.  Patient been wearing a cam boot.  Pain is on and off.  Pain scale is 4 out of 10 pain scale.  Patient is a Equities trader.  Another set of x-ray was obtained today for continued evaluation and monitoring.   Review of Systems: Negative except as noted in the HPI. Denies N/V/F/Ch.  Past Medical History:  Diagnosis Date  . Anemia   . Cancer (HCC)    tongue cancer  . H/O echocardiogram    a. 07/2017: echo showing EF of 55-60%, Grade 1 DD, and no significant valve abnormalities.   . Hepatitis C   . Hepatitis C 04/11/2011  . Leukemia ([place])   . Liver cirrhosis ([place]) 04/11/2011  . Osteoporosis   . Seizures ([place])   . Shingles   . Thrombocytopenia ([place]) 04/11/2011  . Thyroid disease     Current Outpatient Medications:  .  carbamazepine (TEGRETOL XR) 400 MG 12 hr tablet, carbamazepine ER 400 mg tablet,extended release,12 hr, Disp: , Rfl:  .  cholecalciferol (VITAMIN D) 1000 units tablet, Take 1,000 Units by mouth daily., Disp: , Rfl:  .  DEXILANT 60 MG capsule, Take 1 capsule by mouth daily., Disp: , Rfl: 0 .  diclofenac Sodium (VOLTAREN) 1 % GEL, Apply 2 g topically 4 (four) times daily., Disp: 100 g, Rfl: 0 .  Ergocalciferol (VITAMIN D2 PO), Take 1.25 mg by mouth., Disp: , Rfl:  .  estradiol (ESTRACE) 1 MG tablet, TAKE 1 TABLET BY MOUTH DAILY., Disp: 30 tablet, Rfl: 10 .  fexofenadine (ALLEGRA) 180 MG tablet, Take 180 mg by mouth daily as needed. allergies, Disp: , Rfl:    .  folic acid (FOLVITE) 1 MG tablet, TAKE 1 TABLET (1 MG TOTAL) BY MOUTH DAILY., Disp: 30 tablet, Rfl: 4 .  Garlic 123XX123 MG CAPS, Take 1 capsule by mouth daily., Disp: , Rfl:  .  levothyroxine (SYNTHROID, LEVOTHROID) 75 MCG tablet, Take 75 mcg by mouth daily before breakfast. , Disp: , Rfl:  .  metoprolol tartrate (LOPRESSOR) 25 MG tablet, TAKE 1 TABLET BY MOUTH TWICE A DAY, Disp: 180 tablet, Rfl: 3 .  Multiple Vitamins-Minerals (CENTRUM PO), Take 1 tablet by mouth daily.  , Disp: , Rfl:  .  olmesartan (BENICAR) 20 MG tablet, Take 20 mg by mouth daily., Disp: , Rfl:  .  progesterone (PROMETRIUM) 100 MG capsule, TAKE 1 CAPSULE BY MOUTH DAILY FOR 15 DAYS PER MONTH, Disp: 15 capsule, Rfl: 5 .  rosuvastatin (CRESTOR) 40 MG tablet, Take 40 mg by mouth every evening. , Disp: , Rfl:  .  VASCEPA 1 g CAPS, TAKE 2 CAPSULES BY MOUTH 2 TIMES A DAY, Disp: , Rfl: 1 .  vitamin B-12 (CYANOCOBALAMIN) 1000 MCG tablet, Take 1,000 mcg by mouth daily., Disp: , Rfl:  .  vitamin C (ASCORBIC ACID) 500 MG tablet, Take 500 mg by mouth daily. , Disp: , Rfl:  .  vitamin E 400 UNIT capsule, Take 400 Units by mouth 2 (two) times daily., Disp: , Rfl:  .  zoledronic acid (RECLAST) 5 MG/100ML SOLN, Inject 5 mg into the vein once. Once yearly, Disp: , Rfl:   Social History   Tobacco Use  Smoking Status Never Smoker  Smokeless Tobacco Never Used    Allergies  Allergen Reactions  . Simvastatin Rash  . Briviact [Brivaracetam]     Elevated LFT's  . Keppra [Levetiracetam]     Rash  . Asparaginase Derivatives Rash  . Elspar [Asparaginase] Rash  . Soap Rash    Procedure prep soap   Objective:  There were no vitals filed for this visit. There is no height or weight on file to calculate BMI. Constitutional Well developed. Well nourished.  Vascular Dorsalis pedis pulses palpable bilaterally. Posterior tibial pulses palpable bilaterally. Capillary refill normal to all digits.  No cyanosis or clubbing noted. Pedal hair  growth normal.  Neurologic Normal speech. Oriented to person, place, and time. Epicritic sensation to light touch grossly present bilaterally.  Dermatologic Nails well groomed and normal in appearance. No open wounds. No skin lesions.  Orthopedic:  Pain on palpation to the right third metatarsal midshaft.  Mild pain at the head of the third metatarsal.  No pain with range of motion of the third metatarsal.  Mild pain in the second intermetatarsal space.  Negative Mulder's click however could be a masked due to the stress fracture.   Radiographs: Hallux valgus with minimal degenerative change of the first metatarsophalangeal joint.  No stress fracture noted radiographically. Assessment:   1. Stress fracture of metatarsal bone of right foot with delayed healing, subsequent encounter   2. Tendon tear   3. Neuroma digital nerve    Plan:  Patient was evaluated and treated and all questions answered.  Right third metatarsal stress fracture cannot rule out neuroma of the second interspace -All questions and concerns were discussed with the patient.  I spoke to her extensively about stress fracture versus symptoms of neuroma in the second interspace.  Given that radiographic findings have been negative for stress fracture I am more inclined to rule in neuroma related symptoms.  I believe patient will benefit from a injection/nerve block and if patient give symptomatic relief I will consider discussing neuroma excision versus alcohol 4% sclerosing injection.  I also believe that patient will benefit from MRI evaluation as it is more sensitive to ruling stress fracture with MRI and also be possible to evaluate if there is any tendon tearing. -MRI will be ordered of the right foot to evaluate tendon tears versus stress fracture. -After obtaining consent, and per orders of Dr. Boneta Lucks, injection of one-to-one mixture of 1% lidocaine half percent Marcaine plain given by Felipa Furnace. Patient  instructed to remain in clinic for 20 minutes afterwards, and to report any adverse reaction to me immediately.   No follow-ups on file.

## 2019-07-20 ENCOUNTER — Telehealth: Payer: Self-pay | Admitting: *Deleted

## 2019-07-20 DIAGNOSIS — M84374G Stress fracture, right foot, subsequent encounter for fracture with delayed healing: Secondary | ICD-10-CM

## 2019-07-20 DIAGNOSIS — T148XXA Other injury of unspecified body region, initial encounter: Secondary | ICD-10-CM

## 2019-07-20 NOTE — Telephone Encounter (Signed)
Orders to Dr. Serita Grit assistant, Etheleen Mayhew, CMA for pre-cert, faxed to [facility]

## 2019-07-20 NOTE — Telephone Encounter (Signed)
-----   Message from Felipa Furnace, DPM sent at 07/19/2019  1:18 PM EDT ----- Regarding: MRI right foot Hi Gaile Allmon,  Can you order MRI of the right foot for this patient to rule out tendon tear versus evaluate for stress fracture of the third metatarsal   Thanks Lennette Bihari

## 2019-07-21 ENCOUNTER — Telehealth: Payer: Self-pay

## 2019-07-21 NOTE — Telephone Encounter (Signed)
Etheleen Mayhew, CMA states no prior authorization is needed. Faxed to [facility].

## 2019-07-28 ENCOUNTER — Ambulatory Visit
Admission: RE | Admit: 2019-07-28 | Discharge: 2019-07-28 | Disposition: A | Discharge status: Home / Self Care | Payer: 59 | Source: Ambulatory Visit | Attending: Podiatry | Admitting: Podiatry

## 2019-07-28 ENCOUNTER — Other Ambulatory Visit: Payer: Self-pay

## 2019-07-28 DIAGNOSIS — T148XXA Other injury of unspecified body region, initial encounter: Secondary | ICD-10-CM

## 2019-07-28 DIAGNOSIS — M84374G Stress fracture, right foot, subsequent encounter for fracture with delayed healing: Secondary | ICD-10-CM

## 2019-07-28 DIAGNOSIS — S92324A Nondisplaced fracture of second metatarsal bone, right foot, initial encounter for closed fracture: Secondary | ICD-10-CM | POA: Diagnosis not present

## 2019-08-02 MED FILL — FOLIC ACID 1 MG TABS: 1 | 30 days supply | Qty: 30 | Fill #4

## 2019-08-02 MED FILL — ESTRADIOL 1 MG TABS: 1 | 30 days supply | Qty: 30 | Fill #6

## 2019-08-07 ENCOUNTER — Other Ambulatory Visit: Payer: 59

## 2019-08-11 MED FILL — VIT D2 1.25 MG (50,000 UNIT: 1.25 MG | 13 days supply | Qty: 2 | Fill #2

## 2019-08-13 ENCOUNTER — Encounter: Payer: Self-pay | Admitting: Podiatry

## 2019-08-13 ENCOUNTER — Other Ambulatory Visit: Payer: Self-pay

## 2019-08-13 ENCOUNTER — Ambulatory Visit: Payer: 59 | Admitting: Podiatry

## 2019-08-13 ENCOUNTER — Ambulatory Visit (INDEPENDENT_AMBULATORY_CARE_PROVIDER_SITE_OTHER): Payer: 59

## 2019-08-13 DIAGNOSIS — M84374G Stress fracture, right foot, subsequent encounter for fracture with delayed healing: Secondary | ICD-10-CM

## 2019-08-16 ENCOUNTER — Encounter: Payer: Self-pay | Admitting: Podiatry

## 2019-08-16 NOTE — Progress Notes (Addendum)
Subjective:  Patient ID: Latoya Bautista, female    DOB: [birth year],  MRN: [id number]  Chief Complaint  Patient presents with  . Foot Problem    i don't feel like it is getting any better on the right foot and the bone hurts with standing or sitting and i have used the cream 3 times yesterday     49 y.o. female presents with the above complaint.  Patient is following up for right second metatarsal base stress fracture.  Patient had an MRI done which showed transverse linear stress fracture that has not been healing.  Patient states that she is still in continuous pain.  Patient had recently had her father passed away and has been on her foot a little more than she should be.  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  She states the pain is still unbearable.  She has been ambulating with a cam boot.  She has tried pain cream which does help a little bit but does not make the pain go away.  MRI was reviewed.  She denies any other acute complaints   Review of Systems: Negative except as noted in the HPI. Denies N/V/F/Ch.  Past Medical History:  Diagnosis Date  . Anemia   . Cancer (HCC)    tongue cancer  . H/O echocardiogram    a. 07/2017: echo showing EF of 55-60%, Grade 1 DD, and no significant valve abnormalities.   . Hepatitis C   . Hepatitis C 04/11/2011  . Leukemia ([place])   . Liver cirrhosis ([place]) 04/11/2011  . Osteoporosis   . Seizures ([place])   . Shingles   . Thrombocytopenia ([place]) 04/11/2011  . Thyroid disease     Current Outpatient Medications:  .  carbamazepine (TEGRETOL XR) 400 MG 12 hr tablet, carbamazepine ER 400 mg tablet,extended release,12 hr, Disp: , Rfl:  .  cholecalciferol (VITAMIN D) 1000 units tablet, Take 1,000 Units by mouth daily., Disp: , Rfl:  .  DEXILANT 60 MG capsule, Take 1 capsule by mouth daily., Disp: , Rfl: 0 .  diclofenac Sodium (VOLTAREN) 1 % GEL, Apply 2 g topically 4 (four)  times daily., Disp: 100 g, Rfl: 0 .  Ergocalciferol (VITAMIN D2 PO), Take 1.25 mg by mouth., Disp: , Rfl:  .  estradiol (ESTRACE) 1 MG tablet, TAKE 1 TABLET BY MOUTH DAILY., Disp: 30 tablet, Rfl: 10 .  fexofenadine (ALLEGRA) 180 MG tablet, Take 180 mg by mouth daily as needed. allergies, Disp: , Rfl:  .  folic acid (FOLVITE) 1 MG tablet, TAKE 1 TABLET (1 MG TOTAL) BY MOUTH DAILY., Disp: 30 tablet, Rfl: 4 .  Garlic 123XX123 MG CAPS, Take 1 capsule by mouth daily., Disp: , Rfl:  .  levothyroxine (SYNTHROID, LEVOTHROID) 75 MCG tablet, Take 75 mcg by mouth daily before breakfast. , Disp: , Rfl:  .  metoprolol tartrate (LOPRESSOR) 25 MG tablet, TAKE 1 TABLET BY MOUTH TWICE A DAY, Disp: 180 tablet, Rfl: 3 .  Multiple Vitamins-Minerals (CENTRUM PO), Take 1 tablet by mouth daily.  , Disp: , Rfl:  .  olmesartan (BENICAR) 20 MG tablet, Take 20 mg by mouth daily., Disp: , Rfl:  .  progesterone (PROMETRIUM) 100 MG capsule, TAKE 1 CAPSULE BY MOUTH DAILY FOR 15 DAYS PER MONTH, Disp: 15 capsule, Rfl: 5 .  rosuvastatin (CRESTOR) 40 MG tablet, Take 40 mg by mouth every evening. , Disp: , Rfl:  .  VASCEPA 1 g CAPS, TAKE 2 CAPSULES BY MOUTH 2 TIMES A DAY, Disp: , Rfl: 1 .  vitamin B-12 (CYANOCOBALAMIN) 1000 MCG tablet, Take 1,000 mcg by mouth daily., Disp: , Rfl:  .  vitamin C (ASCORBIC ACID) 500 MG tablet, Take 500 mg by mouth daily. , Disp: , Rfl:  .  vitamin E 400 UNIT capsule, Take 400 Units by mouth 2 (two) times daily., Disp: , Rfl:  .  zoledronic acid (RECLAST) 5 MG/100ML SOLN, Inject 5 mg into the vein once. Once yearly, Disp: , Rfl:   Social History   Tobacco Use  Smoking Status Never Smoker  Smokeless Tobacco Never Used    Allergies  Allergen Reactions  . Simvastatin Rash  . Briviact [Brivaracetam]     Elevated LFT's  . Keppra [Levetiracetam]     Rash  . Asparaginase Derivatives Rash  . Elspar [Asparaginase] Rash  . Soap Rash    Procedure prep soap   Objective:  There were no vitals filed  for this visit. There is no height or weight on file to calculate BMI. Constitutional Well developed. Well nourished.  Vascular Dorsalis pedis pulses palpable bilaterally. Posterior tibial pulses palpable bilaterally. Capillary refill normal to all digits.  No cyanosis or clubbing noted. Pedal hair growth normal.  Neurologic Normal speech. Oriented to person, place, and time. Epicritic sensation to light touch grossly present bilaterally.  Dermatologic Nails well groomed and normal in appearance. No open wounds. No skin lesions.  Orthopedic:  Pain on palpation to the right second metatarsal metatarsal base.  No pain with range of motion of the second metatarsal.  Mild pain in the second intermetatarsal space.  Negative Mulder's click however could be a masked due to the stress fracture.   MRI findings: Transverse linear low signal at the base of the second metatarsal with severe surrounding soft tissue edema most concerning for stress fracture without displacement or angulation. No other acute fracture or dislocation. Normal alignment. No joint effusion.  Radiographs: 3 views of skeletally mature adult foot right: -No callus formation noted.  Radiolucency line noted at the base of the second metatarsal.  No other bony abnormalities identified. Assessment:   1. Stress fracture of metatarsal bone of right foot with delayed healing, subsequent encounter    Plan:  Patient was evaluated and treated and all questions answered.  Right second metatarsal base fracture -All questions and concerns were discussed with the patient.  I extensively spoke to her about stress fracture and given that there is no callus formation around it there might be too much stress around the bone itself.  I explained to her the importance of wearing the boot at all times as well as taking it easy.  Patient states understanding and will do so. -I will also consider doing a bone stim application to provide  accelerated bone bridging across a stress fracture site given that patient has 1 mm of mild displacement -Bone stim representative will be contacted. -MRI was reviewed with the patient.  If she does not have any pain resolve meant after undergoing bone scan, we may have to consider surgical intervention to help address the stress fracture.  I am worried that given that there is no callus formation around the the fracture site this may be more of the atrophic nonunion given the length that the pain has presented for the patient.   No follow-ups on file.

## 2019-08-17 ENCOUNTER — Telehealth: Payer: Self-pay | Admitting: *Deleted

## 2019-08-17 DIAGNOSIS — M84374G Stress fracture, right foot, subsequent encounter for fracture with delayed healing: Secondary | ICD-10-CM

## 2019-08-17 NOTE — Telephone Encounter (Deleted)
-----   Message from Felipa Furnace, DPM sent at 08/16/2019  8:41 AM EDT ----- Regarding: Bone stimulator Hi Genevive Printup,  Would you be able to order a bone stimulator for this patient for the right foot.  Patient has a right second metatarsal base stress fracture that has been delayed healing/nonunion to the area and she will benefit from a bone stim.  Thank you Lennette Bihari

## 2019-08-18 NOTE — Telephone Encounter (Signed)
Left message informing [facility] - Latoya Bautista of Latoya Bautista orders. Prepared clinicals with MRI of right foot, demographics and rx for Bone Growth Stimulator using dx of 08/13/2019 for Latoya Bautista to pick up.

## 2019-08-18 NOTE — Telephone Encounter (Signed)
-----   Message from Felipa Furnace, DPM sent at 08/16/2019  8:41 AM EDT ----- Regarding: Bone stimulator Hi Valery,  Would you be able to order a bone stimulator for this patient for the right foot.  Patient has a right second metatarsal base stress fracture that has been delayed healing/nonunion to the area and she will benefit from a bone stim.  Thank you Lennette Bihari

## 2019-08-19 MED FILL — OLMESARTAN MEDOXOMIL 20 MG: 20 | 30 days supply | Qty: 30 | Fill #2

## 2019-08-23 DIAGNOSIS — N951 Menopausal and female climacteric states: Secondary | ICD-10-CM | POA: Diagnosis not present

## 2019-08-23 DIAGNOSIS — D528 Other folate deficiency anemias: Secondary | ICD-10-CM | POA: Diagnosis not present

## 2019-08-23 DIAGNOSIS — G40201 Localization-related (focal) (partial) symptomatic epilepsy and epileptic syndromes with complex partial seizures, not intractable, with status epilepticus: Secondary | ICD-10-CM | POA: Diagnosis not present

## 2019-08-23 DIAGNOSIS — E7800 Pure hypercholesterolemia, unspecified: Secondary | ICD-10-CM | POA: Diagnosis not present

## 2019-08-23 DIAGNOSIS — E559 Vitamin D deficiency, unspecified: Secondary | ICD-10-CM | POA: Diagnosis not present

## 2019-08-23 DIAGNOSIS — E038 Other specified hypothyroidism: Secondary | ICD-10-CM | POA: Diagnosis not present

## 2019-08-23 DIAGNOSIS — D471 Chronic myeloproliferative disease: Secondary | ICD-10-CM | POA: Diagnosis not present

## 2019-08-23 DIAGNOSIS — K229 Disease of esophagus, unspecified: Secondary | ICD-10-CM | POA: Diagnosis not present

## 2019-08-24 ENCOUNTER — Other Ambulatory Visit: Payer: Self-pay | Admitting: Obstetrics and Gynecology

## 2019-08-24 MED FILL — PROGESTERONE 100 MG CAPSULE: 100 | 30 days supply | Qty: 15 | Fill #0

## 2019-08-24 MED FILL — VIT D2 1.25 MG (50,000 UNIT: 1.25 MG | 84 days supply | Qty: 12 | Fill #0

## 2019-08-24 MED FILL — DEXILANT DR 60 MG CAPSULE: 60 | 90 days supply | Qty: 90 | Fill #0

## 2019-08-30 DIAGNOSIS — L308 Other specified dermatitis: Secondary | ICD-10-CM | POA: Diagnosis not present

## 2019-09-01 ENCOUNTER — Other Ambulatory Visit (HOSPITAL_COMMUNITY): Payer: Self-pay | Admitting: Nurse Practitioner

## 2019-09-01 DIAGNOSIS — K7460 Unspecified cirrhosis of liver: Secondary | ICD-10-CM

## 2019-09-01 DIAGNOSIS — D696 Thrombocytopenia, unspecified: Secondary | ICD-10-CM

## 2019-09-01 MED FILL — FOLIC ACID 1 MG TABS: 1 | 30 days supply | Qty: 30 | Fill #0

## 2019-09-01 MED FILL — ESTRADIOL 1 MG TABS: 1 | 30 days supply | Qty: 30 | Fill #7

## 2019-09-02 DIAGNOSIS — K739 Chronic hepatitis, unspecified: Secondary | ICD-10-CM | POA: Diagnosis not present

## 2019-09-02 DIAGNOSIS — E7800 Pure hypercholesterolemia, unspecified: Secondary | ICD-10-CM | POA: Diagnosis not present

## 2019-09-02 DIAGNOSIS — N951 Menopausal and female climacteric states: Secondary | ICD-10-CM | POA: Diagnosis not present

## 2019-09-02 DIAGNOSIS — D471 Chronic myeloproliferative disease: Secondary | ICD-10-CM | POA: Diagnosis not present

## 2019-09-02 DIAGNOSIS — F411 Generalized anxiety disorder: Secondary | ICD-10-CM | POA: Diagnosis not present

## 2019-09-02 DIAGNOSIS — G40201 Localization-related (focal) (partial) symptomatic epilepsy and epileptic syndromes with complex partial seizures, not intractable, with status epilepticus: Secondary | ICD-10-CM | POA: Diagnosis not present

## 2019-09-02 DIAGNOSIS — E559 Vitamin D deficiency, unspecified: Secondary | ICD-10-CM | POA: Diagnosis not present

## 2019-09-02 DIAGNOSIS — M810 Age-related osteoporosis without current pathological fracture: Secondary | ICD-10-CM | POA: Diagnosis not present

## 2019-09-02 DIAGNOSIS — E038 Other specified hypothyroidism: Secondary | ICD-10-CM | POA: Diagnosis not present

## 2019-09-06 DIAGNOSIS — F411 Generalized anxiety disorder: Secondary | ICD-10-CM | POA: Diagnosis not present

## 2019-09-06 DIAGNOSIS — K739 Chronic hepatitis, unspecified: Secondary | ICD-10-CM | POA: Diagnosis not present

## 2019-09-06 DIAGNOSIS — G40201 Localization-related (focal) (partial) symptomatic epilepsy and epileptic syndromes with complex partial seizures, not intractable, with status epilepticus: Secondary | ICD-10-CM | POA: Diagnosis not present

## 2019-09-06 DIAGNOSIS — N951 Menopausal and female climacteric states: Secondary | ICD-10-CM | POA: Diagnosis not present

## 2019-09-06 DIAGNOSIS — E038 Other specified hypothyroidism: Secondary | ICD-10-CM | POA: Diagnosis not present

## 2019-09-06 DIAGNOSIS — D471 Chronic myeloproliferative disease: Secondary | ICD-10-CM | POA: Diagnosis not present

## 2019-09-06 DIAGNOSIS — M810 Age-related osteoporosis without current pathological fracture: Secondary | ICD-10-CM | POA: Diagnosis not present

## 2019-09-06 DIAGNOSIS — E559 Vitamin D deficiency, unspecified: Secondary | ICD-10-CM | POA: Diagnosis not present

## 2019-09-08 ENCOUNTER — Telehealth: Payer: Self-pay | Admitting: Podiatry

## 2019-09-08 DIAGNOSIS — M84374D Stress fracture, right foot, subsequent encounter for fracture with routine healing: Secondary | ICD-10-CM | POA: Diagnosis not present

## 2019-09-08 NOTE — Telephone Encounter (Signed)
Pt called and asking about a bone stimulator that was suppose to be ordered for her please advise

## 2019-09-08 NOTE — Telephone Encounter (Signed)
I spoke with [employer] - T. Nicole Kindred and he states they have contacted pt and will contact pt again, at last review cases was still pending.

## 2019-09-15 DIAGNOSIS — Z1321 Encounter for screening for nutritional disorder: Secondary | ICD-10-CM | POA: Diagnosis not present

## 2019-09-15 DIAGNOSIS — K769 Liver disease, unspecified: Secondary | ICD-10-CM | POA: Diagnosis not present

## 2019-09-15 DIAGNOSIS — N281 Cyst of kidney, acquired: Secondary | ICD-10-CM | POA: Diagnosis not present

## 2019-09-15 DIAGNOSIS — K7460 Unspecified cirrhosis of liver: Secondary | ICD-10-CM | POA: Diagnosis not present

## 2019-09-16 ENCOUNTER — Encounter: Payer: Self-pay | Admitting: Cardiology

## 2019-09-16 ENCOUNTER — Emergency Department (HOSPITAL_COMMUNITY)
Admission: EM | Admit: 2019-09-16 | Discharge: 2019-09-16 | Disposition: A | Discharge status: Home / Self Care | Payer: 59 | Attending: Emergency Medicine | Admitting: Emergency Medicine

## 2019-09-16 ENCOUNTER — Other Ambulatory Visit: Payer: 59 | Admitting: Obstetrics and Gynecology

## 2019-09-16 ENCOUNTER — Ambulatory Visit: Payer: 59 | Admitting: Cardiology

## 2019-09-16 ENCOUNTER — Encounter (HOSPITAL_COMMUNITY): Payer: Self-pay | Admitting: *Deleted

## 2019-09-16 ENCOUNTER — Other Ambulatory Visit: Payer: Self-pay

## 2019-09-16 VITALS — BP 138/76 | HR 104 | Temp 97.4°F | Ht 59.0 in | Wt 132.0 lb

## 2019-09-16 DIAGNOSIS — R002 Palpitations: Secondary | ICD-10-CM | POA: Diagnosis not present

## 2019-09-16 DIAGNOSIS — Z79899 Other long term (current) drug therapy: Secondary | ICD-10-CM | POA: Diagnosis not present

## 2019-09-16 DIAGNOSIS — E871 Hypo-osmolality and hyponatremia: Secondary | ICD-10-CM | POA: Diagnosis not present

## 2019-09-16 LAB — CBC WITH DIFFERENTIAL/PLATELET
Abs Immature Granulocytes: 0.28 10*3/uL — ABNORMAL HIGH (ref 0.00–0.07)
Basophils Absolute: 0.1 10*3/uL (ref 0.0–0.1)
Basophils Relative: 1 %
Eosinophils Absolute: 0.1 10*3/uL (ref 0.0–0.5)
Eosinophils Relative: 1 %
HCT: 39.9 % (ref 36.0–46.0)
Hemoglobin: 13.8 g/dL (ref 12.0–15.0)
Immature Granulocytes: 3 %
Lymphocytes Relative: 29 %
Lymphs Abs: 2.7 10*3/uL (ref 0.7–4.0)
MCH: 33.8 pg (ref 26.0–34.0)
MCHC: 34.6 g/dL (ref 30.0–36.0)
MCV: 97.8 fL (ref 80.0–100.0)
Monocytes Absolute: 0.9 10*3/uL (ref 0.1–1.0)
Monocytes Relative: 10 %
Neutro Abs: 5.4 10*3/uL (ref 1.7–7.7)
Neutrophils Relative %: 56 %
Platelets: 192 10*3/uL (ref 150–400)
RBC: 4.08 MIL/uL (ref 3.87–5.11)
RDW: 11.8 % (ref 11.5–15.5)
WBC: 9.4 10*3/uL (ref 4.0–10.5)
nRBC: 0 % (ref 0.0–0.2)

## 2019-09-16 LAB — COMPREHENSIVE METABOLIC PANEL
ALT: 54 U/L — ABNORMAL HIGH (ref 0–44)
AST: 38 U/L (ref 15–41)
Albumin: 4.2 g/dL (ref 3.5–5.0)
Alkaline Phosphatase: 62 U/L (ref 38–126)
Anion gap: 9 (ref 5–15)
BUN: 26 mg/dL — ABNORMAL HIGH (ref 6–20)
CO2: 24 mmol/L (ref 22–32)
Calcium: 9.9 mg/dL (ref 8.9–10.3)
Chloride: 93 mmol/L — ABNORMAL LOW (ref 98–111)
Creatinine, Ser: 0.62 mg/dL (ref 0.44–1.00)
GFR calc Af Amer: >60 mL/min (ref >60)
GFR calc non Af Amer: >60 mL/min (ref >60)
Glucose, Bld: 109 mg/dL — ABNORMAL HIGH (ref 70–99)
Potassium: 4.8 mmol/L (ref 3.5–5.1)
Sodium: 126 mmol/L — ABNORMAL LOW (ref 135–145)
Total Bilirubin: 0.5 mg/dL (ref 0.3–1.2)
Total Protein: 7.8 g/dL (ref 6.5–8.1)

## 2019-09-16 MED ORDER — SODIUM CHLORIDE 0.9 % IV BOLUS
1000.0000 mL | Freq: Once | INTRAVENOUS | Status: AC
  Administered 2019-09-16: 1000 mL via INTRAVENOUS

## 2019-09-16 NOTE — Patient Instructions (Signed)
Medication Instructions:  Your physician recommends that you continue on your current medications as directed. Please refer to the Current Medication list given to you today.  *If you need a refill on your cardiac medications before your next appointment, please call your pharmacy*   Lab Work: NONE   If you have labs (blood work) drawn today and your tests are completely normal, you will receive your results only by: . MyChart Message (if you have MyChart) OR . A paper copy in the mail If you have any lab test that is abnormal or we need to change your treatment, we will call you to review the results.   Testing/Procedures: NONE    Follow-Up: At [facility], you and your health needs are our priority.  As part of our continuing mission to provide you with exceptional heart care, we have created designated Provider Care Teams.  These Care Teams include your primary Cardiologist (physician) and Advanced Practice Providers (APPs -  Physician Assistants and Nurse Practitioners) who all work together to provide you with the care you need, when you need it.  We recommend signing up for the patient portal called "MyChart".  Sign up information is provided on this After Visit Summary.  MyChart is used to connect with patients for Virtual Visits (Telemedicine).  Patients are able to view lab/test results, encounter notes, upcoming appointments, etc.  Non-urgent messages can be sent to your provider as well.   To learn more about what you can do with MyChart, go to [email or web address].    Your next appointment:   1 year(s)  The format for your next appointment:   In Person  Provider:   Jonathan Branch, MD   Other Instructions Thank you for choosing [facility]!    

## 2019-09-16 NOTE — ED Provider Notes (Signed)
[facility] EMERGENCY DEPARTMENT Provider Note   CSN: [id number] Arrival date & time: 09/16/19  1033     History Chief Complaint  Patient presents with  . Abnormal Lab    Latoya Bautista is a 49 y.o. female with a history of anemia, hepatitis C with known cirrhosis, leukemia and thyroid disease presenting for evaluation of hyponatremia.  She was seen by her cardiologist this morning for an office follow-up regarding history of heart palpitations and she wore an event monitor showing no significant arrhythmias but did have occasional PACs and PVCs.  It was noted at this office visit that lab work obtained by her GI physician at [facility] yesterday (routine 6 month followup visit) showed a significant hypokalemia at 118.  She was advised to come here for further evaluation of this finding.  She denies any symptoms of weakness, confusion, also no headaches, dizziness, nausea or vomiting.  She does state her last sodium level measured by her endocrinologist was 125 about 2 weeks ago.    HPI     Past Medical History:  Diagnosis Date  . Anemia   . Cancer (HCC)    tongue cancer  . H/O echocardiogram    a. 07/2017: echo showing EF of 55-60%, Grade 1 DD, and no significant valve abnormalities.   . Hepatitis C   . Hepatitis C 04/11/2011  . Leukemia ([place])   . Liver cirrhosis ([place]) 04/11/2011  . Osteoporosis   . Seizures ([place])   . Shingles   . Thrombocytopenia ([name]) 04/11/2011  . Thyroid disease     Patient Active Problem List   Diagnosis Date Noted  . Melanoma in situ of skin of buttock ([place]) 08/27/2018  . Vulvar lesion 06/09/2018  . History of thrombocytopenia 12/11/2015  . Well woman exam with routine gynecological exam 10/18/2013  . Localization-related epilepsy ([place]) 01/01/2013  . Partial epilepsy with impairment of consciousness ([place]) 12/15/2012  . Postmenopausal bleeding DUE TO hormone tx. 10/08/2012  . Hepatitis C 04/11/2011  . Liver cirrhosis ([place]) 04/11/2011  . Thrombocytopenia  ([place]) 04/11/2011    Past Surgical History:  Procedure Laterality Date  . BONE MARROW BIOPSY  05/2011  . BONE MARROW TRANSPLANT    . CHOLECYSTECTOMY    . liver biopsie    . TONGUE BIOPSY    . tongue cancer     surgical removal of area     OB History   No obstetric history on file.     Family History  Problem Relation Age of Onset  . Hypertension Father   . Diabetes Maternal Uncle     Social History   Tobacco Use  . Smoking status: Never Smoker  . Smokeless tobacco: Never Used  Substance Use Topics  . Alcohol use: No  . Drug use: No    Home Medications Prior to Admission medications   Medication Sig Start Date End Date Taking? Authorizing Provider  carbamazepine (TEGRETOL XR) 400 MG 12 hr tablet carbamazepine ER 400 mg tablet,extended release,12 hr 10/20/18   [provider]  cholecalciferol (VITAMIN D) 1000 units tablet Take 1,000 Units by mouth daily.    [provider]  DEXILANT 60 MG capsule Take 1 capsule by mouth daily. 02/16/18   [provider]  diclofenac Sodium (VOLTAREN) 1 % GEL Apply 2 g topically 4 (four) times daily. 06/15/19   Evalee Jefferson, PA-C  Ergocalciferol (VITAMIN D2 PO) Take 1.25 mg by mouth.    [provider]  estradiol (ESTRACE) 1 MG tablet TAKE 1  TABLET BY MOUTH DAILY. 02/04/19   [name], MD  fexofenadine (ALLEGRA) 180 MG tablet Take 180 mg by mouth daily as needed. allergies 09/20/10   [provider]  folic acid (FOLVITE) 1 MG tablet TAKE 1 TABLET (1 MG TOTAL) BY MOUTH DAILY. 09/01/19   Lockamy, Randi L, NP-C  Garlic 0388 MG CAPS Take 1 capsule by mouth daily.    [provider]  levothyroxine (SYNTHROID, LEVOTHROID) 75 MCG tablet Take 75 mcg by mouth daily before breakfast.     [provider]  metoprolol tartrate (LOPRESSOR) 25 MG tablet TAKE 1 TABLET BY MOUTH TWICE A DAY 04/12/19   Arnoldo Lenis, MD  Multiple Vitamins-Minerals (CENTRUM PO) Take 1 tablet by mouth daily.       [provider]  olmesartan (BENICAR) 20 MG tablet Take 20 mg by mouth daily.    [provider]  progesterone (PROMETRIUM) 100 MG capsule TAKE 1 CAPSULE BY MOUTH DAILY FOR 15 DAYS PER MONTH 08/24/19   [name], MD  rosuvastatin (CRESTOR) 40 MG tablet Take 40 mg by mouth every evening.     [provider]  VASCEPA 1 g CAPS TAKE 2 CAPSULES BY MOUTH 2 TIMES A DAY 01/12/18   [provider]  vitamin B-12 (CYANOCOBALAMIN) 1000 MCG tablet Take 1,000 mcg by mouth daily.    [provider]  vitamin C (ASCORBIC ACID) 500 MG tablet Take 500 mg by mouth daily.  07/18/10   [provider]  vitamin E 400 UNIT capsule Take 400 Units by mouth 2 (two) times daily.    [provider]  zoledronic acid (RECLAST) 5 MG/100ML SOLN Inject 5 mg into the vein once. Once yearly    [provider]    Allergies    Simvastatin, Briviact [brivaracetam], Keppra [levetiracetam], Asparaginase derivatives, Elspar [asparaginase], and Soap  Review of Systems   Review of Systems  Constitutional: Negative for chills and fever.  HENT: Negative for congestion and sore throat.   Eyes: Negative.   Respiratory: Negative for chest tightness and shortness of breath.   Cardiovascular: Negative for chest pain.  Gastrointestinal: Negative for abdominal pain, nausea and vomiting.  Genitourinary: Negative.  Negative for decreased urine volume and dysuria.  Musculoskeletal: Negative for arthralgias, joint swelling and neck pain.  Skin: Negative.  Negative for rash and wound.  Neurological: Negative for dizziness, seizures, speech difficulty, weakness, light-headedness, numbness and headaches.  Psychiatric/Behavioral: Negative.  Negative for confusion and decreased concentration.    Physical Exam Updated Vital Signs BP 120/77   Pulse 99   Temp 98.2 F (36.8 C)   Resp 18   SpO2 100%   Physical Exam Vitals and nursing note reviewed.  Constitutional:       Appearance: She is well-developed.  HENT:     Head: Normocephalic and atraumatic.     Mouth/Throat:     Mouth: Mucous membranes are moist.  Eyes:     Conjunctiva/sclera: Conjunctivae normal.  Cardiovascular:     Rate and Rhythm: Normal rate and regular rhythm.     Heart sounds: Normal heart sounds.  Pulmonary:     Effort: Pulmonary effort is normal.     Breath sounds: Normal breath sounds. No wheezing.  Abdominal:     General: Bowel sounds are normal.     Palpations: Abdomen is soft.     Tenderness: There is no abdominal tenderness.  Musculoskeletal:        General: Normal range of motion.  Cervical back: Normal range of motion.     Comments: Cam walker right leg (metatarsal stress fracture).   Skin:    General: Skin is warm and dry.  Neurological:     Mental Status: She is alert.     ED Results / Procedures / Treatments   Labs (all labs ordered are listed, but only abnormal results are displayed) Labs Reviewed  CBC WITH DIFFERENTIAL/PLATELET - Abnormal; Notable for the following components:      Result Value   Abs Immature Granulocytes 0.28 (*)    All other components within normal limits  COMPREHENSIVE METABOLIC PANEL - Abnormal; Notable for the following components:   Sodium 126 (*)    Chloride 93 (*)    Glucose, Bld 109 (*)    BUN 26 (*)    ALT 54 (*)    All other components within normal limits    EKG None  Radiology No results found.  Procedures Procedures (including critical care time)  Medications Ordered in ED Medications  sodium chloride 0.9 % bolus 1,000 mL (has no administration in time range)    ED Course  I have reviewed the triage vital signs and the nursing notes.  Pertinent labs & imaging results that were available during my care of the patient were reviewed by me and considered in my medical decision making (see chart for details).    MDM Rules/Calculators/A&P                      Patient with apparent chronic hyponatremia,  not significantly different, in fact stable from previous sodium level obtained 2 weeks ago by her endocrinologist.  Review of labs from [facility] visit yesterday with a sodium of 118, repeated today was 126.  She has no symptoms of hypernatremia.  She was given a 1 L bolus of normal saline while here.  She endorses appointment with her endocrinologist tomorrow in [place], she was encouraged to keep this appointment.  As needed follow-up anticipated. Final Clinical Impression(s) / ED Diagnoses Final diagnoses:  Chronic hyponatremia    Rx / DC Orders ED Discharge Orders    None       Landis Martins 09/16/19 1215    Dorie Rank, MD 09/17/19 [phone]

## 2019-09-16 NOTE — Discharge Instructions (Addendum)
You have been given 1 liter of normal saline today which can help improve your blood sodium level, however,  since this seems to be a chronic range for you and you are not having any worrisome symptoms, you do not need to be admitted to the hospital today.  You do need to follow up with your endocrinologist tomorrow as you have already scheduled.  Let him know your sodium level here today was 126.

## 2019-09-16 NOTE — ED Triage Notes (Signed)
States she was advised by her PCP to come in for evaluation of low sodium

## 2019-09-16 NOTE — Progress Notes (Signed)
Clinical Summary Ms. Bugaj is a 49 y.o.female seen today for follow up of the following medical problems.    1. 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 Anemia   . Cancer (HCC)    tongue cancer  . H/O echocardiogram    a. 07/2017: echo showing EF of 55-60%, Grade 1 DD, and no significant valve abnormalities.   . Hepatitis C   . Hepatitis C 04/11/2011  . Leukemia ([place])   . Liver cirrhosis ([place]) 04/11/2011  . Osteoporosis   . Seizures ([place])   . Shingles   . Thrombocytopenia ([place]) 04/11/2011  . Thyroid disease      Allergies  Allergen Reactions  . Simvastatin Rash  . Briviact [Brivaracetam]     Elevated LFT's  . Keppra [Levetiracetam]     Rash  . Asparaginase Derivatives Rash  . Elspar [Asparaginase] Rash  . Soap Rash    Procedure prep soap     Current Outpatient Medications  Medication Sig Dispense Refill  . carbamazepine (TEGRETOL XR) 400 MG 12 hr tablet carbamazepine ER 400 mg tablet,extended release,12 hr    . cholecalciferol (VITAMIN D) 1000 units tablet Take 1,000 Units by mouth daily.    Marland Kitchen DEXILANT 60 MG capsule Take 1 capsule by mouth daily.  0  . diclofenac Sodium (VOLTAREN) 1 % GEL Apply 2 g topically 4 (four) times daily. 100 g 0  . Ergocalciferol (VITAMIN D2 PO) Take 1.25 mg by mouth.    . estradiol (ESTRACE) 1 MG tablet TAKE 1 TABLET BY MOUTH DAILY. 30 tablet 10  . fexofenadine (ALLEGRA) 180 MG tablet Take 180 mg by mouth daily as needed. allergies    . folic acid (FOLVITE) 1 MG tablet TAKE 1 TABLET (1 MG TOTAL) BY MOUTH DAILY. 30 tablet 4  . Garlic 1638 MG CAPS Take 1 capsule by mouth daily.    Marland Kitchen levothyroxine (SYNTHROID, LEVOTHROID) 75 MCG tablet Take 75 mcg by mouth daily before breakfast.     . metoprolol tartrate (LOPRESSOR) 25 MG tablet TAKE  1 TABLET BY MOUTH TWICE A DAY 180 tablet 3  . Multiple Vitamins-Minerals (CENTRUM PO) Take 1 tablet by mouth daily.      Marland Kitchen olmesartan (BENICAR) 20 MG tablet Take 20 mg by mouth daily.    . progesterone (PROMETRIUM) 100 MG capsule TAKE 1 CAPSULE BY MOUTH DAILY FOR 15 DAYS PER MONTH 15 capsule 5  . rosuvastatin (CRESTOR) 40 MG tablet Take 40 mg by mouth every evening.     Marland Kitchen VASCEPA 1 g CAPS TAKE 2 CAPSULES BY MOUTH 2 TIMES A DAY  1  . vitamin B-12 (CYANOCOBALAMIN) 1000 MCG tablet Take 1,000 mcg by mouth daily.    . vitamin C (ASCORBIC ACID) 500 MG tablet Take 500 mg by mouth daily.     . vitamin E 400 UNIT capsule Take 400 Units by mouth 2 (two) times daily.    . zoledronic acid (RECLAST) 5 MG/100ML SOLN Inject 5 mg into the vein once. Once yearly     No current facility-administered medications for this visit.     Past Surgical History:  Procedure Laterality Date  . BONE MARROW BIOPSY  05/2011  . BONE MARROW TRANSPLANT    . CHOLECYSTECTOMY    . liver biopsie    . TONGUE BIOPSY    .  tongue cancer     surgical removal of area     Allergies  Allergen Reactions  . Simvastatin Rash  . Briviact [Brivaracetam]     Elevated LFT's  . Keppra [Levetiracetam]     Rash  . Asparaginase Derivatives Rash  . Elspar [Asparaginase] Rash  . Soap Rash    Procedure prep soap      Family History  Problem Relation Age of Onset  . Hypertension Father   . Diabetes Maternal Uncle      Social History Ms. Flatt reports that she has never smoked. She has never used smokeless tobacco. Ms. Crusoe reports no history of alcohol use.   Review of Systems CONSTITUTIONAL: No weight loss, fever, chills, weakness or fatigue.  HEENT: Eyes: No visual loss, blurred vision, double vision or yellow sclerae.No hearing loss, sneezing, congestion, runny nose or sore throat.  SKIN: No rash or itching.  CARDIOVASCULAR: per hpi RESPIRATORY: No shortness of breath, cough or sputum.  GASTROINTESTINAL: No  anorexia, nausea, vomiting or diarrhea. No abdominal pain or blood.  GENITOURINARY: No burning on urination, no polyuria NEUROLOGICAL: No headache, dizziness, syncope, paralysis, ataxia, numbness or tingling in the extremities. No change in bowel or bladder control.  MUSCULOSKELETAL: No muscle, back pain, joint pain or stiffness.  LYMPHATICS: No enlarged nodes. No history of splenectomy.  PSYCHIATRIC: No history of depression or anxiety.  ENDOCRINOLOGIC: No reports of sweating, cold or heat intolerance. No polyuria or polydipsia.  Marland Kitchen   Physical Examination Today's Vitals   09/16/19 0959  BP: 138/76  Pulse: (!) 104  Temp: (!) 97.4 F (36.3 C)  SpO2: 98%  Weight: 132 lb (59.9 kg)  Height: '4\' 11"'$  (1.499 m)   Body mass index is 26.66 kg/m.  Gen: resting comfortably, no acute distress HEENT: no scleral icterus, pupils equal round and reactive, no palptable cervical adenopathy,  CV: RRR, no m/r/g, no jvd Resp: Clear to auscultation bilaterally GI: abdomen is soft, non-tender, non-distended, normal bowel sounds, no hepatosplenomegaly MSK: extremities are warm, no edema.  Skin: warm, no rash Neuro:  no focal deficits Psych: appropriate affect   Diagnostic Studies  07/2017 echo Study Conclusions  - Left ventricle: The cavity size was normal. Wall thickness was normal. Systolic function was normal. The estimated ejection fraction was in the range of 55% to 60%. Wall motion was normal; there were no regional wall motion abnormalities. Doppler parameters are consistent with abnormal left ventricular relaxation (grade 1 diastolic dysfunction). - Aortic valve: Valve area (VTI): 2.1 cm^2. Valve area (Vmax): 2.43 cm^2. Valve area (Vmean): 2.5 cm^2. - Technically adequate study.   08/2017 event monitor  14 day event monitor  Min HR 71, Max HR 173, Avg HR 102  Reported symptoms correlated with sinus rhythm and sinus tachycardia. Rare PACs and PVCs  No significant  arrhythmias   Assessment and Plan   1. Palpitations - event monitor with only benign ectopy, no significant arrhythmias - symptoms overall controlled on lopressor, continue  2. Hyponatremia - noted Na of 118 by labs with her [facility] GI physician, there recs for her to be seen in the ER but she was resistant. I encouraged her to be seen as well (labs available on care everywhere)        Arnoldo Lenis, M.D.

## 2019-09-17 ENCOUNTER — Ambulatory Visit (INDEPENDENT_AMBULATORY_CARE_PROVIDER_SITE_OTHER): Payer: 59 | Admitting: Obstetrics and Gynecology

## 2019-09-17 ENCOUNTER — Encounter: Payer: Self-pay | Admitting: Obstetrics and Gynecology

## 2019-09-17 VITALS — BP 162/86 | HR 93 | Ht 59.0 in | Wt 134.4 lb

## 2019-09-17 DIAGNOSIS — Z1272 Encounter for screening for malignant neoplasm of vagina: Secondary | ICD-10-CM | POA: Diagnosis not present

## 2019-09-17 DIAGNOSIS — G40201 Localization-related (focal) (partial) symptomatic epilepsy and epileptic syndromes with complex partial seizures, not intractable, with status epilepticus: Secondary | ICD-10-CM | POA: Diagnosis not present

## 2019-09-17 DIAGNOSIS — E038 Other specified hypothyroidism: Secondary | ICD-10-CM | POA: Diagnosis not present

## 2019-09-17 DIAGNOSIS — M810 Age-related osteoporosis without current pathological fracture: Secondary | ICD-10-CM | POA: Diagnosis not present

## 2019-09-17 DIAGNOSIS — F411 Generalized anxiety disorder: Secondary | ICD-10-CM | POA: Diagnosis not present

## 2019-09-17 DIAGNOSIS — D471 Chronic myeloproliferative disease: Secondary | ICD-10-CM | POA: Diagnosis not present

## 2019-09-17 DIAGNOSIS — K739 Chronic hepatitis, unspecified: Secondary | ICD-10-CM | POA: Diagnosis not present

## 2019-09-17 DIAGNOSIS — Z01419 Encounter for gynecological examination (general) (routine) without abnormal findings: Secondary | ICD-10-CM | POA: Diagnosis not present

## 2019-09-17 DIAGNOSIS — E559 Vitamin D deficiency, unspecified: Secondary | ICD-10-CM | POA: Diagnosis not present

## 2019-09-17 DIAGNOSIS — E871 Hypo-osmolality and hyponatremia: Secondary | ICD-10-CM | POA: Diagnosis not present

## 2019-09-17 NOTE — Progress Notes (Signed)
Patient ID: Latoya Bautista, female   DOB: 08-05-1970, 49 y.o.   MRN: [id number]   Assessment:  Annual Gyn Exam Hx hep c s/p tx.  Plan:  1. Pap smear done, next pap due in three years 2. Return in two years or prn 3    Annual mammogram advised after age 26 Subjective:  Latoya Bautista is a 49 y.o. female No obstetric history on file. who presents for annual exam. No LMP recorded. Patient is postmenopausal. Last PAP smear was on 06/09/2018 and was normal with negative HPV. The patient has no complaints today.  The following portions of the patient's history were reviewed and updated as appropriate: allergies, current medications, past family history, past medical history, past social history, past surgical history and problem list. Past Medical History:  Diagnosis Date  . Anemia   . Cancer (HCC)    tongue cancer  . H/O echocardiogram    a. 07/2017: echo showing EF of 55-60%, Grade 1 DD, and no significant valve abnormalities.   . Hepatitis C   . Hepatitis C 04/11/2011  . Leukemia ([place])   . Liver cirrhosis ([place]) 04/11/2011  . Osteoporosis   . Seizures ([place])   . Shingles   . Thrombocytopenia ([place]) 04/11/2011  . Thyroid disease     Past Surgical History:  Procedure Laterality Date  . BONE MARROW BIOPSY  05/2011  . BONE MARROW TRANSPLANT    . CHOLECYSTECTOMY    . liver biopsie    . TONGUE BIOPSY    . tongue cancer     surgical removal of area     Current Outpatient Medications:  .  carbamazepine (TEGRETOL XR) 400 MG 12 hr tablet, carbamazepine ER 400 mg tablet,extended release,12 hr, Disp: , Rfl:  .  cholecalciferol (VITAMIN D) 1000 units tablet, Take 1,000 Units by mouth daily., Disp: , Rfl:  .  DEXILANT 60 MG capsule, Take 1 capsule by mouth daily., Disp: , Rfl: 0 .  diclofenac Sodium (VOLTAREN) 1 % GEL, Apply 2 g topically 4 (four) times daily., Disp: 100 g, Rfl: 0 .  Ergocalciferol (VITAMIN D2 PO), Take 1.25 mg by mouth., Disp: , Rfl:  .  estradiol (ESTRACE) 1 MG tablet,  TAKE 1 TABLET BY MOUTH DAILY., Disp: 30 tablet, Rfl: 10 .  folic acid (FOLVITE) 1 MG tablet, TAKE 1 TABLET (1 MG TOTAL) BY MOUTH DAILY., Disp: 30 tablet, Rfl: 4 .  Garlic 1638 MG CAPS, Take 1 capsule by mouth daily., Disp: , Rfl:  .  levocetirizine (XYZAL) 5 MG tablet, Take by mouth., Disp: , Rfl:  .  levothyroxine (SYNTHROID, LEVOTHROID) 75 MCG tablet, Take 75 mcg by mouth daily before breakfast. , Disp: , Rfl:  .  metoprolol tartrate (LOPRESSOR) 25 MG tablet, TAKE 1 TABLET BY MOUTH TWICE A DAY, Disp: 180 tablet, Rfl: 3 .  Multiple Vitamins-Minerals (CENTRUM PO), Take 1 tablet by mouth daily.  , Disp: , Rfl:  .  olmesartan (BENICAR) 20 MG tablet, Take 20 mg by mouth daily., Disp: , Rfl:  .  progesterone (PROMETRIUM) 100 MG capsule, TAKE 1 CAPSULE BY MOUTH DAILY FOR 15 DAYS PER MONTH, Disp: 15 capsule, Rfl: 5 .  rosuvastatin (CRESTOR) 40 MG tablet, Take 40 mg by mouth every evening. , Disp: , Rfl:  .  vitamin B-12 (CYANOCOBALAMIN) 1000 MCG tablet, Take 1,000 mcg by mouth daily., Disp: , Rfl:  .  vitamin C (ASCORBIC ACID) 500 MG tablet, Take 500 mg by mouth daily. , Disp: , Rfl:  .  vitamin E 400 UNIT capsule, Take 400 Units by mouth 2 (two) times daily., Disp: , Rfl:   Review of Systems Constitutional: negative Gastrointestinal: negative Genitourinary: negative  Objective:  BP (!) 162/86 (BP Location: Right Arm, Patient Position: Sitting, Cuff Size: Normal)   Pulse 93   Ht _0  (1.499 m)   Wt 134 lb 6.4 oz (61 kg)   BMI 27.15 kg/m    BMI: Body mass index is 27.15 kg/m.  General Appearance: Alert, appropriate appearance for age. No acute distress HEENT: Grossly normal Breast Exam: No dimpling, nipple retraction or discharge. No masses or nodes., Normal to inspection, Normal breast tissue bilaterally and No masses or nodes.No dimpling, nipple retraction or discharge. Gastrointestinal: Soft, non-tender, no masses or organomegaly Pelvic Exam: Vulva and vagina appear normal. Bimanual  exam reveals normal uterus and adnexa. External genitalia: normal general appearance Vaginal: normal mucosa without prolapse or lesions and normal without tenderness, induration or masses Cervix: normal appearance Rectal: Deferred. Will see PCP for rectal exams as needed. Skin: no rash or abnormalities Neurologic: Normal gait and speech, no tremor  Psychiatric: Alert and oriented, appropriate affect.  Urinalysis:Not done  By signing my name below, I, Clerance Lav, attest that this documentation has been prepared under the direction and in the presence of [name], MD. Electronically Signed: [place]. 09/17/19. 12:28 PM.  I personally performed the services described in this documentation, which was SCRIBED in my presence. The recorded information has been reviewed and considered accurate. It has been edited as necessary during review. [name], MD

## 2019-09-23 ENCOUNTER — Ambulatory Visit (INDEPENDENT_AMBULATORY_CARE_PROVIDER_SITE_OTHER): Payer: 59

## 2019-09-23 ENCOUNTER — Ambulatory Visit: Payer: 59 | Admitting: Podiatry

## 2019-09-23 ENCOUNTER — Encounter: Payer: Self-pay | Admitting: Podiatry

## 2019-09-23 ENCOUNTER — Other Ambulatory Visit: Payer: Self-pay

## 2019-09-23 VITALS — Temp 97.2°F

## 2019-09-23 DIAGNOSIS — M84374G Stress fracture, right foot, subsequent encounter for fracture with delayed healing: Secondary | ICD-10-CM

## 2019-09-23 NOTE — Progress Notes (Signed)
Subjective:   Patient ID: Latoya Bautista, female   DOB: 49 y.o.   MRN: [id number]   HPI Patient presents after seeing Dr. Posey Pronto with what appears to be a transverse fracture of the base of the second metatarsal right foot that occurred on February 8 and is still not healed with patient wearing boot and utilizing bone stimulator currently   ROS      Objective:  Physical Exam  Neurovascular status intact with patient noted to have continued discomfort around the base of the second metatarsal right.  She states she has had slight improvement but it still is very sore and she has not gone without her boot when walking     Assessment:  Continued fracture of the base of the second metatarsal right that has had a bone stimulator for 2 weeks and continues to exhibit symptoms     Plan:  H&P reviewed condition recommended continued immobilization ice bone stimulator and explained to her that if symptoms do not start to show some improvement in the next 4 weeks consideration will be for surgical intervention with plate.  Patient will see Dr. Posey Pronto 4 weeks or earlier if needed  X-rays indicate that there is suspicion around the base of the second metatarsal right that has been confirmed on MRI.

## 2019-09-24 ENCOUNTER — Ambulatory Visit: Payer: 59 | Admitting: Podiatry

## 2019-09-24 MED FILL — EVENITY 105 MG/1.17ML SOSY: 105 | 28 days supply | Qty: 2 | Fill #0

## 2019-09-27 MED FILL — FOLIC ACID 1 MG TABS: 1 | 30 days supply | Qty: 30 | Fill #1

## 2019-09-27 MED FILL — ESTRADIOL 1 MG TABS: 1 | 30 days supply | Qty: 30 | Fill #8

## 2019-09-27 MED FILL — CARBAMAZEPINE ER 400 MG TAB: 400 | 90 days supply | Qty: 180 | Fill #3

## 2019-10-01 DIAGNOSIS — K739 Chronic hepatitis, unspecified: Secondary | ICD-10-CM | POA: Diagnosis not present

## 2019-10-01 DIAGNOSIS — E038 Other specified hypothyroidism: Secondary | ICD-10-CM | POA: Diagnosis not present

## 2019-10-01 DIAGNOSIS — M810 Age-related osteoporosis without current pathological fracture: Secondary | ICD-10-CM | POA: Diagnosis not present

## 2019-10-01 DIAGNOSIS — G40201 Localization-related (focal) (partial) symptomatic epilepsy and epileptic syndromes with complex partial seizures, not intractable, with status epilepticus: Secondary | ICD-10-CM | POA: Diagnosis not present

## 2019-10-01 DIAGNOSIS — E871 Hypo-osmolality and hyponatremia: Secondary | ICD-10-CM | POA: Diagnosis not present

## 2019-10-01 DIAGNOSIS — F411 Generalized anxiety disorder: Secondary | ICD-10-CM | POA: Diagnosis not present

## 2019-10-01 DIAGNOSIS — E559 Vitamin D deficiency, unspecified: Secondary | ICD-10-CM | POA: Diagnosis not present

## 2019-10-01 DIAGNOSIS — D471 Chronic myeloproliferative disease: Secondary | ICD-10-CM | POA: Diagnosis not present

## 2019-10-05 MED FILL — METOPROLOL TARTRATE 25 MG T: 25 | 90 days supply | Qty: 180 | Fill #1

## 2019-10-20 ENCOUNTER — Ambulatory Visit: Payer: 59 | Admitting: Podiatry

## 2019-10-20 ENCOUNTER — Other Ambulatory Visit: Payer: Self-pay

## 2019-10-20 ENCOUNTER — Ambulatory Visit (INDEPENDENT_AMBULATORY_CARE_PROVIDER_SITE_OTHER): Payer: 59

## 2019-10-20 ENCOUNTER — Encounter: Payer: Self-pay | Admitting: Podiatry

## 2019-10-20 DIAGNOSIS — M84374G Stress fracture, right foot, subsequent encounter for fracture with delayed healing: Secondary | ICD-10-CM

## 2019-10-20 MED FILL — PROGESTERONE 100 MG CAPSULE: 100 | 30 days supply | Qty: 15 | Fill #2

## 2019-10-20 MED FILL — EVENITY 105 MG/1.17ML SOSY: 105 | 28 days supply | Qty: 2 | Fill #1

## 2019-10-20 MED FILL — OLMESARTAN MEDOXOMIL 20 MG: 20 | 30 days supply | Qty: 30 | Fill #1

## 2019-10-20 NOTE — Progress Notes (Signed)
Subjective:  Patient ID: Latoya Bautista, female    DOB: [birth year],  MRN: [id number]  Chief Complaint  Patient presents with   Foot Pain    pt is here for right fracture f/u, pt states that her pain is a whole lot better since the last time she was here. Pt states that the right foot pain is now a 2 out of 10 on the pain scale.    49 y.o. female presents with the above complaint.  Patient is following up for right second metatarsal base stress fracture.  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.  She states that she has been doing really well.  Her pain scale is now like 2 out of 10.  Her pain has considerably improved.  She has been doing bone stimulator as well.  Overall she is doing really well.  She denies any other acute complaints.  She still has some hard time walking into regular shoes which she has not transitioned aside from a cam boot.  She denies any other acute complaints   Review of Systems: Negative except as noted in the HPI. Denies N/V/F/Ch.  Past Medical History:  Diagnosis Date   Anemia    Cancer ([place])    tongue cancer   H/O echocardiogram    a. 07/2017: echo showing EF of 55-60%, Grade 1 DD, and no significant valve abnormalities.    Hepatitis C    Hepatitis C 04/11/2011   Leukemia ([place])    Liver cirrhosis ([place]) 04/11/2011   Osteoporosis    Seizures ([facility])    Shingles    Thrombocytopenia ([place]) 04/11/2011   Thyroid disease     Current Outpatient Medications:    carbamazepine (TEGRETOL XR) 400 MG 12 hr tablet, carbamazepine ER 400 mg tablet,extended release,12 hr, Disp: , Rfl:    cholecalciferol (VITAMIN D) 1000 units tablet, Take 1,000 Units by mouth daily., Disp: , Rfl:    DEXILANT 60 MG capsule, Take 1 capsule by mouth daily., Disp: , Rfl: 0   diclofenac Sodium (VOLTAREN) 1 % GEL, Apply 2 g topically 4 (four) times daily., Disp: 100 g, Rfl: 0   Ergocalciferol (VITAMIN D2 PO), Take 1.25 mg by mouth., Disp: , Rfl:    estradiol (ESTRACE) 1  MG tablet, TAKE 1 TABLET BY MOUTH DAILY., Disp: 30 tablet, Rfl: 10   EVENITY 105 MG/1.17ML SOSY injection, INJECT TWO DOSES INTO THE SKIN ONCE A MONTH FOR 12 MONTHS, Disp: , Rfl:    folic acid (FOLVITE) 1 MG tablet, TAKE 1 TABLET (1 MG TOTAL) BY MOUTH DAILY., Disp: 30 tablet, Rfl: 4   Garlic 6213 MG CAPS, Take 1 capsule by mouth daily., Disp: , Rfl:    levocetirizine (XYZAL) 5 MG tablet, Take by mouth., Disp: , Rfl:    levothyroxine (SYNTHROID, LEVOTHROID) 75 MCG tablet, Take 75 mcg by mouth daily before breakfast. , Disp: , Rfl:    metoprolol tartrate (LOPRESSOR) 25 MG tablet, TAKE 1 TABLET BY MOUTH TWICE A DAY, Disp: 180 tablet, Rfl: 3   Multiple Vitamins-Minerals (CENTRUM PO), Take 1 tablet by mouth daily.  , Disp: , Rfl:    olmesartan (BENICAR) 20 MG tablet, Take 20 mg by mouth daily., Disp: , Rfl:    progesterone (PROMETRIUM) 100 MG capsule, TAKE 1 CAPSULE BY MOUTH DAILY FOR 15 DAYS PER MONTH, Disp: 15 capsule, Rfl: 5   rosuvastatin (CRESTOR) 40 MG tablet, Take 40 mg by mouth every evening. , Disp: , Rfl:    vitamin B-12 (CYANOCOBALAMIN) 1000 MCG  tablet, Take 1,000 mcg by mouth daily., Disp: , Rfl:    vitamin C (ASCORBIC ACID) 500 MG tablet, Take 500 mg by mouth daily. , Disp: , Rfl:    vitamin E 400 UNIT capsule, Take 400 Units by mouth 2 (two) times daily., Disp: , Rfl:   Social History   Tobacco Use  Smoking Status Never Smoker  Smokeless Tobacco Never Used    Allergies  Allergen Reactions   Simvastatin Rash   Briviact [Brivaracetam]     Elevated LFT's   Keppra [Levetiracetam]     Rash   Asparaginase Derivatives Rash   Elspar [Asparaginase] Rash   Soap Rash    Procedure prep soap   Objective:  There were no vitals filed for this visit. There is no height or weight on file to calculate BMI. Constitutional Well developed. Well nourished.  Vascular Dorsalis pedis pulses palpable bilaterally. Posterior tibial pulses palpable bilaterally. Capillary  refill normal to all digits.  No cyanosis or clubbing noted. Pedal hair growth normal.  Neurologic Normal speech. Oriented to person, place, and time. Epicritic sensation to light touch grossly present bilaterally.  Dermatologic Nails well groomed and normal in appearance. No open wounds. No skin lesions.  Orthopedic:  Mild pain on palpation to the right second metatarsal metatarsal base.  No pain with range of motion of the second metatarsal.  Mild pain in the second intermetatarsal space.  Negative Mulder's click however could be a masked due to the stress fracture.   MRI findings: Transverse linear low signal at the base of the second metatarsal with severe surrounding soft tissue edema most concerning for stress fracture without displacement or angulation. No other acute fracture or dislocation. Normal alignment. No joint effusion.  Radiographs: 3 views of skeletally mature adult foot right: -No callus formation noted.  Radiolucency line noted at the base of the second metatarsal.  No other bony abnormalities identified. Assessment:   1. Stress fracture of metatarsal bone of right foot with delayed healing, subsequent encounter    Plan:  Patient was evaluated and treated and all questions answered.  Right second metatarsal base fracture -All questions and concerns were discussed with the patient.  I extensively spoke to her about stress fracture and given that there is no callus formation around it there might be too much stress around the bone itself.   -Patient can begin transitioning from cam boot into regular sneakers. -Continue wearing bone stimulator -  If she does not have any pain resolve when she begins to transition into regular sneakers meant after undergoing bone scan, we may have to consider surgical intervention to help address the stress fracture.  I am worried that given that there is no callus formation around the the fracture site this may be more of the atrophic  nonunion given the length that the pain has presented for the patient.   No follow-ups on file.

## 2019-10-21 ENCOUNTER — Encounter: Payer: Self-pay | Admitting: Podiatry

## 2019-10-22 DIAGNOSIS — M810 Age-related osteoporosis without current pathological fracture: Secondary | ICD-10-CM | POA: Diagnosis not present

## 2019-10-22 DIAGNOSIS — E038 Other specified hypothyroidism: Secondary | ICD-10-CM | POA: Diagnosis not present

## 2019-10-22 DIAGNOSIS — K739 Chronic hepatitis, unspecified: Secondary | ICD-10-CM | POA: Diagnosis not present

## 2019-10-22 DIAGNOSIS — D471 Chronic myeloproliferative disease: Secondary | ICD-10-CM | POA: Diagnosis not present

## 2019-10-22 DIAGNOSIS — K229 Disease of esophagus, unspecified: Secondary | ICD-10-CM | POA: Diagnosis not present

## 2019-10-26 ENCOUNTER — Other Ambulatory Visit (HOSPITAL_COMMUNITY): Payer: Self-pay | Admitting: Neurology

## 2019-10-26 DIAGNOSIS — G40109 Localization-related (focal) (partial) symptomatic epilepsy and epileptic syndromes with simple partial seizures, not intractable, without status epilepticus: Secondary | ICD-10-CM | POA: Diagnosis not present

## 2019-10-26 DIAGNOSIS — G40209 Localization-related (focal) (partial) symptomatic epilepsy and epileptic syndromes with complex partial seizures, not intractable, without status epilepticus: Secondary | ICD-10-CM | POA: Diagnosis not present

## 2019-10-28 MED FILL — ESTRADIOL 1 MG TABS: 1 | 30 days supply | Qty: 30 | Fill #9

## 2019-10-28 MED FILL — FOLIC ACID 1 MG TABS: 1 | 30 days supply | Qty: 30 | Fill #2

## 2019-11-02 DIAGNOSIS — M810 Age-related osteoporosis without current pathological fracture: Secondary | ICD-10-CM | POA: Diagnosis not present

## 2019-11-02 DIAGNOSIS — K739 Chronic hepatitis, unspecified: Secondary | ICD-10-CM | POA: Diagnosis not present

## 2019-11-02 DIAGNOSIS — E038 Other specified hypothyroidism: Secondary | ICD-10-CM | POA: Diagnosis not present

## 2019-11-02 DIAGNOSIS — E559 Vitamin D deficiency, unspecified: Secondary | ICD-10-CM | POA: Diagnosis not present

## 2019-11-02 DIAGNOSIS — G40201 Localization-related (focal) (partial) symptomatic epilepsy and epileptic syndromes with complex partial seizures, not intractable, with status epilepticus: Secondary | ICD-10-CM | POA: Diagnosis not present

## 2019-11-02 DIAGNOSIS — F411 Generalized anxiety disorder: Secondary | ICD-10-CM | POA: Diagnosis not present

## 2019-11-02 DIAGNOSIS — D471 Chronic myeloproliferative disease: Secondary | ICD-10-CM | POA: Diagnosis not present

## 2019-11-02 DIAGNOSIS — E871 Hypo-osmolality and hyponatremia: Secondary | ICD-10-CM | POA: Diagnosis not present

## 2019-11-11 MED FILL — VIT D2 1.25 MG (50,000 UNIT: 1.25 MG | 84 days supply | Qty: 12 | Fill #1

## 2019-11-16 MED FILL — DEXILANT DR 60 MG CAPSULE: 60 | 90 days supply | Qty: 90 | Fill #0

## 2019-11-16 MED FILL — OLMESARTAN MEDOXOMIL 20 MG: 20 | 30 days supply | Qty: 30 | Fill #2

## 2019-11-16 MED FILL — PROGESTERONE 100 MG CAPSULE: 100 | 30 days supply | Qty: 15 | Fill #3

## 2019-11-17 ENCOUNTER — Encounter: Payer: Self-pay | Admitting: Podiatry

## 2019-11-17 ENCOUNTER — Other Ambulatory Visit: Payer: Self-pay

## 2019-11-17 ENCOUNTER — Ambulatory Visit (INDEPENDENT_AMBULATORY_CARE_PROVIDER_SITE_OTHER): Payer: 59

## 2019-11-17 ENCOUNTER — Ambulatory Visit: Payer: 59 | Admitting: Podiatry

## 2019-11-17 DIAGNOSIS — M84374G Stress fracture, right foot, subsequent encounter for fracture with delayed healing: Secondary | ICD-10-CM | POA: Diagnosis not present

## 2019-11-18 ENCOUNTER — Encounter: Payer: Self-pay | Admitting: Podiatry

## 2019-11-18 MED FILL — EVENITY 105 MG/1.17ML SOSY: 105 | 28 days supply | Qty: 2 | Fill #2

## 2019-11-18 NOTE — Progress Notes (Signed)
Subjective:  Patient ID: Latoya Bautista, female    DOB: 23-Jul-1970,  MRN: [id number]  Chief Complaint  Patient presents with  . Foot Injury    Pt states right foot fracture is improving but she still has occasional pain.    49 y.o. female presents with the above complaint.  Patient presents following a right second metatarsal base fracture stress fracture.  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.  She is doing really well.  Her pain has gone down.  She is ambulating in regular sneakers.  She would like to know when she can return to work.   Review of Systems: Negative except as noted in the HPI. Denies N/V/F/Ch.  Past Medical History:  Diagnosis Date  . Anemia   . Cancer ([facility])    tongue cancer  . H/O echocardiogram    a. 07/2017: echo showing EF of 55-60%, Grade 1 DD, and no significant valve abnormalities.   . Hepatitis C   . Hepatitis C 04/11/2011  . Leukemia ([place])   . Liver cirrhosis ([place]) 04/11/2011  . Osteoporosis   . Seizures ([place])   . Shingles   . Thrombocytopenia ([place]) 04/11/2011  . Thyroid disease     Current Outpatient Medications:  .  carbamazepine (TEGRETOL XR) 400 MG 12 hr tablet, carbamazepine ER 400 mg tablet,extended release,12 hr, Disp: , Rfl:  .  cholecalciferol (VITAMIN D) 1000 units tablet, Take 1,000 Units by mouth daily., Disp: , Rfl:  .  DEXILANT 60 MG capsule, Take 1 capsule by mouth daily., Disp: , Rfl: 0 .  diclofenac Sodium (VOLTAREN) 1 % GEL, Apply 2 g topically 4 (four) times daily., Disp: 100 g, Rfl: 0 .  Ergocalciferol (VITAMIN D2 PO), Take 1.25 mg by mouth., Disp: , Rfl:  .  estradiol (ESTRACE) 1 MG tablet, TAKE 1 TABLET BY MOUTH DAILY., Disp: 30 tablet, Rfl: 10 .  EVENITY 105 MG/1.17ML SOSY injection, INJECT TWO DOSES INTO THE SKIN ONCE A MONTH FOR 12 MONTHS, Disp: , Rfl:  .  folic acid (FOLVITE) 1 MG tablet, TAKE 1 TABLET (1 MG TOTAL) BY MOUTH DAILY., Disp: 30 tablet, Rfl: 4 .  Garlic 7654 MG CAPS, Take 1 capsule by mouth daily., Disp: ,  Rfl:  .  levocetirizine (XYZAL) 5 MG tablet, Take by mouth., Disp: , Rfl:  .  levothyroxine (SYNTHROID, LEVOTHROID) 75 MCG tablet, Take 75 mcg by mouth daily before breakfast. , Disp: , Rfl:  .  metoprolol tartrate (LOPRESSOR) 25 MG tablet, TAKE 1 TABLET BY MOUTH TWICE A DAY, Disp: 180 tablet, Rfl: 3 .  Multiple Vitamins-Minerals (CENTRUM PO), Take 1 tablet by mouth daily.  , Disp: , Rfl:  .  olmesartan (BENICAR) 20 MG tablet, Take 20 mg by mouth daily., Disp: , Rfl:  .  progesterone (PROMETRIUM) 100 MG capsule, TAKE 1 CAPSULE BY MOUTH DAILY FOR 15 DAYS PER MONTH, Disp: 15 capsule, Rfl: 5 .  rosuvastatin (CRESTOR) 40 MG tablet, Take 40 mg by mouth every evening. , Disp: , Rfl:  .  vitamin B-12 (CYANOCOBALAMIN) 1000 MCG tablet, Take 1,000 mcg by mouth daily., Disp: , Rfl:  .  vitamin C (ASCORBIC ACID) 500 MG tablet, Take 500 mg by mouth daily. , Disp: , Rfl:  .  vitamin E 400 UNIT capsule, Take 400 Units by mouth 2 (two) times daily., Disp: , Rfl:   Social History   Tobacco Use  Smoking Status Never Smoker  Smokeless Tobacco Never Used    Allergies  Allergen Reactions  .  Simvastatin Rash  . Briviact [Brivaracetam]     Elevated LFT's  . Keppra [Levetiracetam]     Rash  . Asparaginase Derivatives Rash  . Elspar [Asparaginase] Rash  . Soap Rash    Procedure prep soap   Objective:  There were no vitals filed for this visit. There is no height or weight on file to calculate BMI. Constitutional Well developed. Well nourished.  Vascular Dorsalis pedis pulses palpable bilaterally. Posterior tibial pulses palpable bilaterally. Capillary refill normal to all digits.  No cyanosis or clubbing noted. Pedal hair growth normal.  Neurologic Normal speech. Oriented to person, place, and time. Epicritic sensation to light touch grossly present bilaterally.  Dermatologic Nails well groomed and normal in appearance. No open wounds. No skin lesions.  Orthopedic:  No pain on palpation to the  right second metatarsal metatarsal base.  No pain with range of motion of the second metatarsal.  No pain in the second intermetatarsal space.  Negative Mulder's click however could be a masked due to the stress fracture.   MRI findings: Transverse linear low signal at the base of the second metatarsal with severe surrounding soft tissue edema most concerning for stress fracture without displacement or angulation. No other acute fracture or dislocation. Normal alignment. No joint effusion.  Radiographs: 3 views of skeletally mature adult foot right: -No callus formation noted.  Radiolucency line noted at the base of the second metatarsal.  No other bony abnormalities identified. Assessment:   1. Stress fracture of metatarsal bone of right foot with delayed healing, subsequent encounter    Plan:  Patient was evaluated and treated and all questions answered.  Right second metatarsal base fracture -Clinically the pain has resolved.  Patient will continue the bone stim for now.  She can start returning back to work.  She has transition herself from cam boot into regular sneakers.  Pain well controlled. -Given that she has had significant improvement pain I will hold off on any surgical procedure for now.  In the future if the pain asked back up again I discussed with her that she may need surgery with fusion of the second metatarsal tarsometatarsal joint.  Patient states understanding   No follow-ups on file.

## 2019-11-23 DIAGNOSIS — G40A09 Absence epileptic syndrome, not intractable, without status epilepticus: Secondary | ICD-10-CM | POA: Diagnosis not present

## 2019-11-23 DIAGNOSIS — G40309 Generalized idiopathic epilepsy and epileptic syndromes, not intractable, without status epilepticus: Secondary | ICD-10-CM | POA: Diagnosis not present

## 2019-11-23 DIAGNOSIS — B182 Chronic viral hepatitis C: Secondary | ICD-10-CM | POA: Diagnosis not present

## 2019-11-23 DIAGNOSIS — R002 Palpitations: Secondary | ICD-10-CM | POA: Diagnosis not present

## 2019-11-23 DIAGNOSIS — R000 Tachycardia, unspecified: Secondary | ICD-10-CM | POA: Diagnosis not present

## 2019-11-23 DIAGNOSIS — G4089 Other seizures: Secondary | ICD-10-CM | POA: Diagnosis not present

## 2019-11-23 DIAGNOSIS — Z6825 Body mass index (BMI) 25.0-25.9, adult: Secondary | ICD-10-CM | POA: Diagnosis not present

## 2019-11-23 DIAGNOSIS — R21 Rash and other nonspecific skin eruption: Secondary | ICD-10-CM | POA: Diagnosis not present

## 2019-11-26 MED FILL — FOLIC ACID 1 MG TABS: 1 | 30 days supply | Qty: 30 | Fill #3

## 2019-11-26 MED FILL — ESTRADIOL 1 MG TABS: 1 | 30 days supply | Qty: 30 | Fill #10

## 2019-12-01 MED FILL — ROSUVASTATIN CALCIUM 40 MG: 40 | 90 days supply | Qty: 90 | Fill #1

## 2019-12-02 DIAGNOSIS — E782 Mixed hyperlipidemia: Secondary | ICD-10-CM | POA: Diagnosis not present

## 2019-12-02 DIAGNOSIS — R7301 Impaired fasting glucose: Secondary | ICD-10-CM | POA: Diagnosis not present

## 2019-12-02 DIAGNOSIS — D696 Thrombocytopenia, unspecified: Secondary | ICD-10-CM | POA: Diagnosis not present

## 2019-12-02 DIAGNOSIS — E870 Hyperosmolality and hypernatremia: Secondary | ICD-10-CM | POA: Diagnosis not present

## 2019-12-02 DIAGNOSIS — Z85828 Personal history of other malignant neoplasm of skin: Secondary | ICD-10-CM | POA: Diagnosis not present

## 2019-12-02 DIAGNOSIS — K7460 Unspecified cirrhosis of liver: Secondary | ICD-10-CM | POA: Diagnosis not present

## 2019-12-02 DIAGNOSIS — E039 Hypothyroidism, unspecified: Secondary | ICD-10-CM | POA: Diagnosis not present

## 2019-12-02 DIAGNOSIS — L820 Inflamed seborrheic keratosis: Secondary | ICD-10-CM | POA: Diagnosis not present

## 2019-12-02 DIAGNOSIS — Z08 Encounter for follow-up examination after completed treatment for malignant neoplasm: Secondary | ICD-10-CM | POA: Diagnosis not present

## 2019-12-02 DIAGNOSIS — R000 Tachycardia, unspecified: Secondary | ICD-10-CM | POA: Diagnosis not present

## 2019-12-02 DIAGNOSIS — B182 Chronic viral hepatitis C: Secondary | ICD-10-CM | POA: Diagnosis not present

## 2019-12-02 DIAGNOSIS — C021 Malignant neoplasm of border of tongue: Secondary | ICD-10-CM | POA: Diagnosis not present

## 2019-12-02 DIAGNOSIS — I9589 Other hypotension: Secondary | ICD-10-CM | POA: Diagnosis not present

## 2019-12-03 DIAGNOSIS — F411 Generalized anxiety disorder: Secondary | ICD-10-CM | POA: Diagnosis not present

## 2019-12-03 DIAGNOSIS — G40201 Localization-related (focal) (partial) symptomatic epilepsy and epileptic syndromes with complex partial seizures, not intractable, with status epilepticus: Secondary | ICD-10-CM | POA: Diagnosis not present

## 2019-12-03 DIAGNOSIS — E038 Other specified hypothyroidism: Secondary | ICD-10-CM | POA: Diagnosis not present

## 2019-12-03 DIAGNOSIS — E871 Hypo-osmolality and hyponatremia: Secondary | ICD-10-CM | POA: Diagnosis not present

## 2019-12-03 DIAGNOSIS — K739 Chronic hepatitis, unspecified: Secondary | ICD-10-CM | POA: Diagnosis not present

## 2019-12-03 DIAGNOSIS — D471 Chronic myeloproliferative disease: Secondary | ICD-10-CM | POA: Diagnosis not present

## 2019-12-03 DIAGNOSIS — E559 Vitamin D deficiency, unspecified: Secondary | ICD-10-CM | POA: Diagnosis not present

## 2019-12-03 DIAGNOSIS — M810 Age-related osteoporosis without current pathological fracture: Secondary | ICD-10-CM | POA: Diagnosis not present

## 2019-12-10 DIAGNOSIS — C021 Malignant neoplasm of border of tongue: Secondary | ICD-10-CM | POA: Diagnosis not present

## 2019-12-17 MED FILL — SYNTHROID 75 MCG TABLET: 75 | 90 days supply | Qty: 90 | Fill #1

## 2019-12-17 MED FILL — PROGESTERONE 100 MG CAPSULE: 100 | 30 days supply | Qty: 15 | Fill #4

## 2019-12-17 MED FILL — CARBAMAZEPINE ER 400 MG TAB: 400 | 90 days supply | Qty: 180 | Fill #0

## 2019-12-20 MED FILL — VASCEPA 1 GM CAPSULE: 1 | 90 days supply | Qty: 360 | Fill #0

## 2019-12-20 MED FILL — OLMESARTAN MEDOXOMIL 20 MG: 20 | 90 days supply | Qty: 90 | Fill #0

## 2019-12-27 ENCOUNTER — Other Ambulatory Visit: Payer: Self-pay | Admitting: Obstetrics and Gynecology

## 2019-12-27 MED FILL — METOPROLOL TARTRATE 25 MG T: 25 | 90 days supply | Qty: 180 | Fill #2

## 2019-12-27 MED FILL — EVENITY 105 MG/1.17ML SOSY: 105 | 28 days supply | Qty: 2 | Fill #3

## 2019-12-27 MED FILL — FOLIC ACID 1 MG TABS: 1 | 30 days supply | Qty: 30 | Fill #4

## 2019-12-30 ENCOUNTER — Other Ambulatory Visit: Payer: Self-pay | Admitting: Obstetrics and Gynecology

## 2019-12-30 MED FILL — ESTRADIOL 1 MG TABS: 1 | 30 days supply | Qty: 30 | Fill #0

## 2020-01-03 DIAGNOSIS — M810 Age-related osteoporosis without current pathological fracture: Secondary | ICD-10-CM | POA: Diagnosis not present

## 2020-01-03 DIAGNOSIS — K739 Chronic hepatitis, unspecified: Secondary | ICD-10-CM | POA: Diagnosis not present

## 2020-01-03 DIAGNOSIS — E559 Vitamin D deficiency, unspecified: Secondary | ICD-10-CM | POA: Diagnosis not present

## 2020-01-03 DIAGNOSIS — E038 Other specified hypothyroidism: Secondary | ICD-10-CM | POA: Diagnosis not present

## 2020-01-03 DIAGNOSIS — E7800 Pure hypercholesterolemia, unspecified: Secondary | ICD-10-CM | POA: Diagnosis not present

## 2020-01-03 DIAGNOSIS — D528 Other folate deficiency anemias: Secondary | ICD-10-CM | POA: Diagnosis not present

## 2020-01-03 DIAGNOSIS — N951 Menopausal and female climacteric states: Secondary | ICD-10-CM | POA: Diagnosis not present

## 2020-01-03 DIAGNOSIS — D471 Chronic myeloproliferative disease: Secondary | ICD-10-CM | POA: Diagnosis not present

## 2020-01-12 DIAGNOSIS — D696 Thrombocytopenia, unspecified: Secondary | ICD-10-CM | POA: Diagnosis not present

## 2020-01-12 DIAGNOSIS — E039 Hypothyroidism, unspecified: Secondary | ICD-10-CM | POA: Diagnosis not present

## 2020-01-12 DIAGNOSIS — E782 Mixed hyperlipidemia: Secondary | ICD-10-CM | POA: Diagnosis not present

## 2020-01-12 DIAGNOSIS — R000 Tachycardia, unspecified: Secondary | ICD-10-CM | POA: Diagnosis not present

## 2020-01-12 DIAGNOSIS — E870 Hyperosmolality and hypernatremia: Secondary | ICD-10-CM | POA: Diagnosis not present

## 2020-01-12 DIAGNOSIS — I9589 Other hypotension: Secondary | ICD-10-CM | POA: Diagnosis not present

## 2020-01-12 DIAGNOSIS — R7301 Impaired fasting glucose: Secondary | ICD-10-CM | POA: Diagnosis not present

## 2020-01-12 DIAGNOSIS — B182 Chronic viral hepatitis C: Secondary | ICD-10-CM | POA: Diagnosis not present

## 2020-01-12 DIAGNOSIS — K7460 Unspecified cirrhosis of liver: Secondary | ICD-10-CM | POA: Diagnosis not present

## 2020-01-13 DIAGNOSIS — D471 Chronic myeloproliferative disease: Secondary | ICD-10-CM | POA: Diagnosis not present

## 2020-01-13 DIAGNOSIS — M810 Age-related osteoporosis without current pathological fracture: Secondary | ICD-10-CM | POA: Diagnosis not present

## 2020-01-13 DIAGNOSIS — F411 Generalized anxiety disorder: Secondary | ICD-10-CM | POA: Diagnosis not present

## 2020-01-13 DIAGNOSIS — K739 Chronic hepatitis, unspecified: Secondary | ICD-10-CM | POA: Diagnosis not present

## 2020-01-13 DIAGNOSIS — E871 Hypo-osmolality and hyponatremia: Secondary | ICD-10-CM | POA: Diagnosis not present

## 2020-01-13 DIAGNOSIS — E038 Other specified hypothyroidism: Secondary | ICD-10-CM | POA: Diagnosis not present

## 2020-01-13 DIAGNOSIS — E559 Vitamin D deficiency, unspecified: Secondary | ICD-10-CM | POA: Diagnosis not present

## 2020-01-13 DIAGNOSIS — G40201 Localization-related (focal) (partial) symptomatic epilepsy and epileptic syndromes with complex partial seizures, not intractable, with status epilepticus: Secondary | ICD-10-CM | POA: Diagnosis not present

## 2020-01-19 MED FILL — PROGESTERONE 100 MG CAPSULE: 100 | 30 days supply | Qty: 15 | Fill #5

## 2020-01-24 ENCOUNTER — Other Ambulatory Visit (HOSPITAL_COMMUNITY): Payer: Self-pay | Admitting: Nurse Practitioner

## 2020-01-24 DIAGNOSIS — K7460 Unspecified cirrhosis of liver: Secondary | ICD-10-CM

## 2020-01-24 DIAGNOSIS — D696 Thrombocytopenia, unspecified: Secondary | ICD-10-CM

## 2020-01-24 MED FILL — ESTRADIOL 1 MG TABS: 1 | 30 days supply | Qty: 30 | Fill #1

## 2020-01-25 ENCOUNTER — Other Ambulatory Visit (HOSPITAL_COMMUNITY): Payer: Self-pay | Admitting: Nurse Practitioner

## 2020-01-25 MED FILL — EVENITY 105 MG/1.17ML SOSY: 105 | 28 days supply | Qty: 2 | Fill #4

## 2020-01-25 MED FILL — FOLIC ACID 1 MG TABS: 1 | 30 days supply | Qty: 30 | Fill #0

## 2020-01-25 MED FILL — VIT D2 1.25 MG (50,000 UNIT: 1.25 MG | 13 days supply | Qty: 2 | Fill #2

## 2020-02-07 MED FILL — DEXILANT DR 60 MG CAPSULE: 60 | 90 days supply | Qty: 90 | Fill #0

## 2020-02-15 ENCOUNTER — Other Ambulatory Visit: Payer: Self-pay | Admitting: *Deleted

## 2020-02-15 ENCOUNTER — Other Ambulatory Visit: Payer: Self-pay | Admitting: Obstetrics & Gynecology

## 2020-02-15 MED ORDER — PROGESTERONE MICRONIZED 100 MG PO CAPS: ORAL_CAPSULE | ORAL | 5 refills | Status: DC

## 2020-02-15 MED FILL — PROGESTERONE 100 MG CAPSULE: 100 | 30 days supply | Qty: 15 | Fill #0

## 2020-02-18 ENCOUNTER — Other Ambulatory Visit (HOSPITAL_COMMUNITY): Payer: Self-pay | Admitting: Endocrinology

## 2020-02-18 DIAGNOSIS — E559 Vitamin D deficiency, unspecified: Secondary | ICD-10-CM | POA: Diagnosis not present

## 2020-02-18 DIAGNOSIS — E038 Other specified hypothyroidism: Secondary | ICD-10-CM | POA: Diagnosis not present

## 2020-02-18 DIAGNOSIS — M810 Age-related osteoporosis without current pathological fracture: Secondary | ICD-10-CM | POA: Diagnosis not present

## 2020-02-18 DIAGNOSIS — D471 Chronic myeloproliferative disease: Secondary | ICD-10-CM | POA: Diagnosis not present

## 2020-02-18 DIAGNOSIS — K739 Chronic hepatitis, unspecified: Secondary | ICD-10-CM | POA: Diagnosis not present

## 2020-02-18 DIAGNOSIS — E7800 Pure hypercholesterolemia, unspecified: Secondary | ICD-10-CM | POA: Diagnosis not present

## 2020-02-18 DIAGNOSIS — E871 Hypo-osmolality and hyponatremia: Secondary | ICD-10-CM | POA: Diagnosis not present

## 2020-02-18 DIAGNOSIS — F411 Generalized anxiety disorder: Secondary | ICD-10-CM | POA: Diagnosis not present

## 2020-02-18 DIAGNOSIS — G40201 Localization-related (focal) (partial) symptomatic epilepsy and epileptic syndromes with complex partial seizures, not intractable, with status epilepticus: Secondary | ICD-10-CM | POA: Diagnosis not present

## 2020-02-18 MED FILL — VIT D2 1.25 MG (50,000 UNIT: 1.25 MG | 90 days supply | Qty: 13 | Fill #0

## 2020-02-18 MED FILL — ROSUVASTATIN CALCIUM 40 MG: 40 | 90 days supply | Qty: 90 | Fill #0

## 2020-02-23 DIAGNOSIS — E559 Vitamin D deficiency, unspecified: Secondary | ICD-10-CM | POA: Diagnosis not present

## 2020-02-23 DIAGNOSIS — D471 Chronic myeloproliferative disease: Secondary | ICD-10-CM | POA: Diagnosis not present

## 2020-02-23 DIAGNOSIS — E038 Other specified hypothyroidism: Secondary | ICD-10-CM | POA: Diagnosis not present

## 2020-02-23 DIAGNOSIS — K739 Chronic hepatitis, unspecified: Secondary | ICD-10-CM | POA: Diagnosis not present

## 2020-02-23 DIAGNOSIS — G40201 Localization-related (focal) (partial) symptomatic epilepsy and epileptic syndromes with complex partial seizures, not intractable, with status epilepticus: Secondary | ICD-10-CM | POA: Diagnosis not present

## 2020-02-23 DIAGNOSIS — E7800 Pure hypercholesterolemia, unspecified: Secondary | ICD-10-CM | POA: Diagnosis not present

## 2020-02-23 DIAGNOSIS — K229 Disease of esophagus, unspecified: Secondary | ICD-10-CM | POA: Diagnosis not present

## 2020-02-23 DIAGNOSIS — D528 Other folate deficiency anemias: Secondary | ICD-10-CM | POA: Diagnosis not present

## 2020-02-26 ENCOUNTER — Other Ambulatory Visit (HOSPITAL_COMMUNITY): Payer: Self-pay | Admitting: Internal Medicine

## 2020-02-28 MED FILL — FOLIC ACID 1 MG TABS: 1 | 30 days supply | Qty: 30 | Fill #1

## 2020-02-28 MED FILL — ESTRADIOL 1 MG TABS: 1 | 30 days supply | Qty: 30 | Fill #2

## 2020-03-03 DIAGNOSIS — F411 Generalized anxiety disorder: Secondary | ICD-10-CM | POA: Diagnosis not present

## 2020-03-03 DIAGNOSIS — K739 Chronic hepatitis, unspecified: Secondary | ICD-10-CM | POA: Diagnosis not present

## 2020-03-03 DIAGNOSIS — E871 Hypo-osmolality and hyponatremia: Secondary | ICD-10-CM | POA: Diagnosis not present

## 2020-03-03 DIAGNOSIS — E559 Vitamin D deficiency, unspecified: Secondary | ICD-10-CM | POA: Diagnosis not present

## 2020-03-03 DIAGNOSIS — E038 Other specified hypothyroidism: Secondary | ICD-10-CM | POA: Diagnosis not present

## 2020-03-03 DIAGNOSIS — M810 Age-related osteoporosis without current pathological fracture: Secondary | ICD-10-CM | POA: Diagnosis not present

## 2020-03-03 DIAGNOSIS — D471 Chronic myeloproliferative disease: Secondary | ICD-10-CM | POA: Diagnosis not present

## 2020-03-03 DIAGNOSIS — G40201 Localization-related (focal) (partial) symptomatic epilepsy and epileptic syndromes with complex partial seizures, not intractable, with status epilepticus: Secondary | ICD-10-CM | POA: Diagnosis not present

## 2020-03-07 MED FILL — EVENITY 105 MG/1.17ML SOSY: 105 | 28 days supply | Qty: 2 | Fill #5

## 2020-03-13 ENCOUNTER — Other Ambulatory Visit (HOSPITAL_COMMUNITY): Payer: Self-pay | Admitting: Surgery

## 2020-03-13 DIAGNOSIS — D696 Thrombocytopenia, unspecified: Secondary | ICD-10-CM

## 2020-03-13 MED FILL — VASCEPA 1 GM CAPSULE: 1 | 90 days supply | Qty: 360 | Fill #0

## 2020-03-13 MED FILL — CARBAMAZEPINE ER 400 MG TAB: 400 | 90 days supply | Qty: 180 | Fill #1

## 2020-03-13 MED FILL — OLMESARTAN MEDOXOMIL 20 MG: 20 | 90 days supply | Qty: 90 | Fill #0

## 2020-03-14 ENCOUNTER — Inpatient Hospital Stay (HOSPITAL_COMMUNITY): Payer: 59 | Attending: Hematology

## 2020-03-14 ENCOUNTER — Other Ambulatory Visit: Payer: Self-pay

## 2020-03-14 DIAGNOSIS — D696 Thrombocytopenia, unspecified: Secondary | ICD-10-CM

## 2020-03-14 DIAGNOSIS — Z791 Long term (current) use of non-steroidal anti-inflammatories (NSAID): Secondary | ICD-10-CM | POA: Insufficient documentation

## 2020-03-14 DIAGNOSIS — M810 Age-related osteoporosis without current pathological fracture: Secondary | ICD-10-CM | POA: Insufficient documentation

## 2020-03-14 DIAGNOSIS — D6959 Other secondary thrombocytopenia: Secondary | ICD-10-CM | POA: Diagnosis not present

## 2020-03-14 DIAGNOSIS — Z85810 Personal history of malignant neoplasm of tongue: Secondary | ICD-10-CM | POA: Insufficient documentation

## 2020-03-14 DIAGNOSIS — Z856 Personal history of leukemia: Secondary | ICD-10-CM | POA: Insufficient documentation

## 2020-03-14 DIAGNOSIS — K7460 Unspecified cirrhosis of liver: Secondary | ICD-10-CM | POA: Insufficient documentation

## 2020-03-14 DIAGNOSIS — L570 Actinic keratosis: Secondary | ICD-10-CM | POA: Insufficient documentation

## 2020-03-14 DIAGNOSIS — Z8619 Personal history of other infectious and parasitic diseases: Secondary | ICD-10-CM | POA: Diagnosis not present

## 2020-03-14 DIAGNOSIS — E871 Hypo-osmolality and hyponatremia: Secondary | ICD-10-CM | POA: Insufficient documentation

## 2020-03-14 DIAGNOSIS — Z79899 Other long term (current) drug therapy: Secondary | ICD-10-CM | POA: Diagnosis not present

## 2020-03-14 LAB — CBC WITH DIFFERENTIAL/PLATELET
Abs Immature Granulocytes: 0.02 10*3/uL (ref 0.00–0.07)
Basophils Absolute: 0 10*3/uL (ref 0.0–0.1)
Basophils Relative: 1 %
Eosinophils Absolute: 0.1 10*3/uL (ref 0.0–0.5)
Eosinophils Relative: 1 %
HCT: 40.7 % (ref 36.0–46.0)
Hemoglobin: 13.8 g/dL (ref 12.0–15.0)
Immature Granulocytes: 0 %
Lymphocytes Relative: 31 %
Lymphs Abs: 2.1 10*3/uL (ref 0.7–4.0)
MCH: 34.2 pg — ABNORMAL HIGH (ref 26.0–34.0)
MCHC: 33.9 g/dL (ref 30.0–36.0)
MCV: 101 fL — ABNORMAL HIGH (ref 80.0–100.0)
Monocytes Absolute: 0.6 10*3/uL (ref 0.1–1.0)
Monocytes Relative: 9 %
Neutro Abs: 4 10*3/uL (ref 1.7–7.7)
Neutrophils Relative %: 58 %
Platelets: 169 10*3/uL (ref 150–400)
RBC: 4.03 MIL/uL (ref 3.87–5.11)
RDW: 12.2 % (ref 11.5–15.5)
WBC: 6.8 10*3/uL (ref 4.0–10.5)
nRBC: 0 % (ref 0.0–0.2)

## 2020-03-14 LAB — COMPREHENSIVE METABOLIC PANEL
ALT: 39 U/L (ref 0–44)
AST: 38 U/L (ref 15–41)
Albumin: 4.2 g/dL (ref 3.5–5.0)
Alkaline Phosphatase: 62 U/L (ref 38–126)
Anion gap: 12 (ref 5–15)
BUN: 24 mg/dL — ABNORMAL HIGH (ref 6–20)
CO2: 22 mmol/L (ref 22–32)
Calcium: 9.5 mg/dL (ref 8.9–10.3)
Chloride: 99 mmol/L (ref 98–111)
Creatinine, Ser: 0.71 mg/dL (ref 0.44–1.00)
GFR, Estimated: >60 mL/min (ref >60)
Glucose, Bld: 120 mg/dL — ABNORMAL HIGH (ref 70–99)
Potassium: 4.3 mmol/L (ref 3.5–5.1)
Sodium: 133 mmol/L — ABNORMAL LOW (ref 135–145)
Total Bilirubin: 0.5 mg/dL (ref 0.3–1.2)
Total Protein: 7.6 g/dL (ref 6.5–8.1)

## 2020-03-14 LAB — VITAMIN B12: Vitamin B-12: 5225 pg/mL — ABNORMAL HIGH (ref 180–914)

## 2020-03-14 LAB — LACTATE DEHYDROGENASE: LDH: 152 U/L (ref 98–192)

## 2020-03-14 LAB — VITAMIN D 25 HYDROXY (VIT D DEFICIENCY, FRACTURES): Vit D, 25-Hydroxy: 122.15 ng/mL — ABNORMAL HIGH (ref 30–100)

## 2020-03-15 DIAGNOSIS — K7460 Unspecified cirrhosis of liver: Secondary | ICD-10-CM | POA: Diagnosis not present

## 2020-03-15 DIAGNOSIS — E039 Hypothyroidism, unspecified: Secondary | ICD-10-CM | POA: Diagnosis not present

## 2020-03-15 DIAGNOSIS — K769 Liver disease, unspecified: Secondary | ICD-10-CM | POA: Diagnosis not present

## 2020-03-15 DIAGNOSIS — E7800 Pure hypercholesterolemia, unspecified: Secondary | ICD-10-CM | POA: Diagnosis not present

## 2020-03-15 DIAGNOSIS — I10 Essential (primary) hypertension: Secondary | ICD-10-CM | POA: Diagnosis not present

## 2020-03-15 DIAGNOSIS — B1920 Unspecified viral hepatitis C without hepatic coma: Secondary | ICD-10-CM | POA: Diagnosis not present

## 2020-03-20 ENCOUNTER — Other Ambulatory Visit: Payer: Self-pay

## 2020-03-20 ENCOUNTER — Inpatient Hospital Stay (HOSPITAL_BASED_OUTPATIENT_CLINIC_OR_DEPARTMENT_OTHER): Payer: 59 | Admitting: Oncology

## 2020-03-20 VITALS — BP 122/67 | HR 86 | Temp 97.0°F | Resp 18 | Wt 128.3 lb

## 2020-03-20 DIAGNOSIS — Z791 Long term (current) use of non-steroidal anti-inflammatories (NSAID): Secondary | ICD-10-CM | POA: Diagnosis not present

## 2020-03-20 DIAGNOSIS — M810 Age-related osteoporosis without current pathological fracture: Secondary | ICD-10-CM | POA: Diagnosis not present

## 2020-03-20 DIAGNOSIS — K7460 Unspecified cirrhosis of liver: Secondary | ICD-10-CM | POA: Diagnosis not present

## 2020-03-20 DIAGNOSIS — L570 Actinic keratosis: Secondary | ICD-10-CM | POA: Diagnosis not present

## 2020-03-20 DIAGNOSIS — Z856 Personal history of leukemia: Secondary | ICD-10-CM | POA: Diagnosis not present

## 2020-03-20 DIAGNOSIS — D6959 Other secondary thrombocytopenia: Secondary | ICD-10-CM | POA: Diagnosis not present

## 2020-03-20 DIAGNOSIS — E871 Hypo-osmolality and hyponatremia: Secondary | ICD-10-CM | POA: Diagnosis not present

## 2020-03-20 DIAGNOSIS — Z85810 Personal history of malignant neoplasm of tongue: Secondary | ICD-10-CM | POA: Diagnosis not present

## 2020-03-20 DIAGNOSIS — D696 Thrombocytopenia, unspecified: Secondary | ICD-10-CM | POA: Diagnosis not present

## 2020-03-20 DIAGNOSIS — Z8619 Personal history of other infectious and parasitic diseases: Secondary | ICD-10-CM | POA: Diagnosis not present

## 2020-03-20 MED FILL — SYNTHROID 75 MCG TABLET: 75 | 90 days supply | Qty: 90 | Fill #0

## 2020-03-20 MED FILL — PROGESTERONE 100 MG CAPSULE: 100 | 30 days supply | Qty: 15 | Fill #1

## 2020-03-20 NOTE — Progress Notes (Signed)
Latoya Bautista, [place] [address]   CLINIC:  Medical Oncology/Hematology  PCP:  Latoya Squibb, MD [address] [place] [address] [phone]   REASON FOR VISIT: Follow-up for thrombocytopenia secondary to cirrhosis of the liver  CURRENT THERAPY: Observation   INTERVAL HISTORY:  Latoya Bautista 49 y.o. female returns for routine follow-up for thrombocytopenia secondary to cirrhosis of the liver. She is doing well since her last appointment.  She has had normal platelet counts for the past several years.  In the interim, she reports having a tongue and skin biopsy of her vulva.  Her tongue was positive for cancer and skin biopsy was positive for melanoma.  Both were removed and she is followed closely by Dr. Luvenia Bautista and Dr. Glo Bautista (GYN).  Reports several tongue biopsies in the past secondary to being treated for leukemia as a child.  She is followed closely by Dr. Luvenia Bautista and is evaluated every 6 months.  She is currently taking salt tablets for low sodium levels.  Denies any nausea, vomiting, or diarrhea. Denies any new pains. Had not noticed any recent bleeding such as epistaxis, hematuria or hematochezia. Denies recent chest pain on exertion, shortness of breath on minimal exertion, pre-syncopal episodes, or palpitations. Denies any numbness or tingling in hands or feet. Denies any recent fevers, infections, or recent hospitalizations. Patient reports appetite at 100% and energy level at 100%. She is eating well and maintaining her weight at this time.    REVIEW OF SYSTEMS:  Review of Systems  Constitutional: Negative.  Negative for appetite change, chills, fatigue and fever.  HENT:  Negative.  Negative for hearing loss, lump/mass, mouth sores and nosebleeds.        Recent tongue biopsy  Eyes: Negative.  Negative for eye problems.  Respiratory: Negative for cough, hemoptysis and shortness of breath.   Cardiovascular: Negative.  Negative for chest pain  and leg swelling.  Gastrointestinal: Negative.  Negative for abdominal pain, blood in stool, constipation, diarrhea, nausea and vomiting.  Endocrine: Negative.  Negative for hot flashes.  Genitourinary: Negative.  Negative for bladder incontinence, difficulty urinating, dysuria, frequency and hematuria.   Musculoskeletal: Negative.  Negative for back pain, flank pain, gait problem and myalgias.  Skin: Negative.  Negative for itching and rash.  Neurological: Negative.  Negative for dizziness, gait problem, headaches, light-headedness and numbness.  Hematological: Negative.  Negative for adenopathy.  Psychiatric/Behavioral: Negative for confusion. The patient is not nervous/anxious.   All other systems reviewed and are negative.    PAST MEDICAL/SURGICAL HISTORY:  Past Medical History:  Diagnosis Date  . Anemia   . Cancer ([facility])    tongue cancer  . H/O echocardiogram    a. 07/2017: echo showing EF of 55-60%, Grade 1 DD, and no significant valve abnormalities.   . Hepatitis C   . Hepatitis C 04/11/2011  . Leukemia ([facility])   . Liver cirrhosis ([facility]) 04/11/2011  . Osteoporosis   . Seizures ([facility])   . Shingles   . Thrombocytopenia ([facility]) 04/11/2011  . Thyroid disease    Past Surgical History:  Procedure Laterality Date  . BONE MARROW BIOPSY  05/2011  . BONE MARROW TRANSPLANT    . CHOLECYSTECTOMY    . liver biopsie    . TONGUE BIOPSY    . tongue cancer     surgical removal of area     SOCIAL HISTORY:  Social History   Socioeconomic History  . Marital status: Married  Spouse name: Latoya Bautista  . Number of children: 0  . Years of education: 12th  . Highest education level: Not on file  Occupational History    Employer: [employer]  Tobacco Use  . Smoking status: Never Smoker  . Smokeless tobacco: Never Used  Vaping Use  . Vaping Use: Never used  Substance and Sexual Activity  . Alcohol use: No  . Drug use: No  . Sexual activity: Not Currently    Birth  control/protection: None, Post-menopausal  Other Topics Concern  . Not on file  Social History Narrative   Patient lives at home with her spouse.   Caffeine Use: 16oz bottle of soda daily   Social Determinants of Health   Financial Resource Strain: Medium Risk  . Difficulty of Paying Living Expenses: Somewhat hard  Food Insecurity: No Food Insecurity  . Worried About Charity fundraiser in the Last Year: Never true  . Ran Out of Food in the Last Year: Never true  Transportation Needs: No Transportation Needs  . Lack of Transportation (Medical): No  . Lack of Transportation (Non-Medical): No  Physical Activity: Inactive  . Days of Exercise per Week: 0 days  . Minutes of Exercise per Session: 0 min  Stress: No Stress Concern Present  . Feeling of Stress : Not at all  Social Connections: Socially Integrated  . Frequency of Communication with Friends and Family: More than three times a week  . Frequency of Social Gatherings with Friends and Family: Three times a week  . Attends Religious Services: More than 4 times per year  . Active Member of Clubs or Organizations: Yes  . Attends Archivist Meetings: More than 4 times per year  . Marital Status: Married  Human resources officer Violence: Not At Risk  . Fear of Current or Ex-Partner: No  . Emotionally Abused: No  . Physically Abused: No  . Sexually Abused: No    FAMILY HISTORY:  Family History  Problem Relation Age of Onset  . Hypertension Father   . Diabetes Maternal Uncle     CURRENT MEDICATIONS:  Outpatient Encounter Medications as of 03/20/2020  Medication Sig Note  . carbamazepine (TEGRETOL XR) 400 MG 12 hr tablet carbamazepine ER 400 mg tablet,extended release,12 hr   . cholecalciferol (VITAMIN D) 1000 units tablet Take 1,000 Units by mouth daily.   Marland Kitchen DEXILANT 60 MG capsule Take 1 capsule by mouth daily.   . diclofenac Sodium (VOLTAREN) 1 % GEL Apply 2 g topically 4 (four) times daily.   . Ergocalciferol  (VITAMIN D2 PO) Take 1.25 mg by mouth.   . estradiol (ESTRACE) 1 MG tablet TAKE 1 TABLET BY MOUTH DAILY.   Marland Kitchen EVENITY 105 MG/1.17ML SOSY injection INJECT TWO DOSES INTO THE SKIN ONCE A MONTH FOR 12 MONTHS   . folic acid (FOLVITE) 1 MG tablet TAKE 1 TABLET (1 MG TOTAL) BY MOUTH DAILY.   . Garlic 4008 MG CAPS Take 1 capsule by mouth daily.   Marland Kitchen levocetirizine (XYZAL) 5 MG tablet Take by mouth.   . levothyroxine (SYNTHROID, LEVOTHROID) 75 MCG tablet Take 75 mcg by mouth daily before breakfast.    . metoprolol tartrate (LOPRESSOR) 25 MG tablet TAKE 1 TABLET BY MOUTH TWICE A DAY   . MODERNA COVID-19 VACCINE 100 MCG/0.5ML injection    . Multiple Vitamins-Minerals (CENTRUM PO) Take 1 tablet by mouth daily.     Marland Kitchen olmesartan (BENICAR) 20 MG tablet Take 20 mg by mouth daily.   . progesterone (  PROMETRIUM) 100 MG capsule TAKE 1 CAPSULE BY MOUTH DAILY FOR 15 DAYS PER MONTH   . rosuvastatin (CRESTOR) 40 MG tablet Take 40 mg by mouth every evening.  03/17/2015: Received from: [facility]: Take 40 mg by mouth.  . sodium bicarbonate 325 MG tablet Take by mouth.   Marland Kitchen VASCEPA 1 g capsule Take 2 g by mouth 2 (two) times daily.   . vitamin B-12 (CYANOCOBALAMIN) 1000 MCG tablet Take 1,000 mcg by mouth daily.   . vitamin C (ASCORBIC ACID) 500 MG tablet Take 500 mg by mouth daily.    . vitamin E 400 UNIT capsule Take 400 Units by mouth 2 (two) times daily.   . [DISCONTINUED] Vitamin D, Ergocalciferol, (DRISDOL) 1.25 MG (50000 UNIT) CAPS capsule Take 50,000 Units by mouth once a week.    No facility-administered encounter medications on file as of 03/20/2020.    ALLERGIES:  Allergies  Allergen Reactions  . Simvastatin Rash  . Briviact [Brivaracetam]     Elevated LFT's  . Keppra [Levetiracetam]     Rash  . Asparaginase Derivatives Rash  . Elspar [Asparaginase] Rash  . Soap Rash    Procedure prep soap     PHYSICAL EXAM:  ECOG Performance status: 1  Vitals:   03/20/20 0810  BP: 122/67    Pulse: 86  Resp: 18  Temp: (!) 97 F (36.1 C)  SpO2: 100%   Filed Weights   03/20/20 0810  Weight: 128 lb 4.8 oz (58.2 kg)    Physical Exam Constitutional:      Appearance: Normal appearance. She is normal weight.  Cardiovascular:     Rate and Rhythm: Normal rate and regular rhythm.     Heart sounds: Normal heart sounds.  Pulmonary:     Effort: Pulmonary effort is normal.     Breath sounds: Normal breath sounds.  Abdominal:     General: Bowel sounds are normal.     Palpations: Abdomen is soft.  Musculoskeletal:        General: Normal range of motion.  Skin:    General: Skin is warm.  Neurological:     Mental Status: She is alert and oriented to person, place, and time. Mental status is at baseline.  Psychiatric:        Mood and Affect: Mood normal.        Behavior: Behavior normal.        Thought Content: Thought content normal.        Judgment: Judgment normal.      LABORATORY DATA:  I have reviewed the labs as listed.  CBC    Component Value Date/Time   WBC 6.8 03/14/2020 0815   RBC 4.03 03/14/2020 0815   HGB 13.8 03/14/2020 0815   HCT 40.7 03/14/2020 0815   PLT 169 03/14/2020 0815   MCV 101.0 (H) 03/14/2020 0815   MCH 34.2 (H) 03/14/2020 0815   MCHC 33.9 03/14/2020 0815   RDW 12.2 03/14/2020 0815   LYMPHSABS 2.1 03/14/2020 0815   MONOABS 0.6 03/14/2020 0815   EOSABS 0.1 03/14/2020 0815   BASOSABS 0.0 03/14/2020 0815   CMP Latest Ref Rng & Units 03/14/2020 09/16/2019 03/16/2019  Glucose 70 - 99 mg/dL 120(H) 109(H) 107(H)  BUN 6 - 20 mg/dL 24(H) 26(H) 23(H)  Creatinine 0.44 - 1.00 mg/dL 0.71 0.62 0.64  Sodium 135 - 145 mmol/L 133(L) 126(L) 127(L)  Potassium 3.5 - 5.1 mmol/L 4.3 4.8 4.0  Chloride 98 - 111 mmol/L 99 93(L) 94(L)  CO2 22 - 32 mmol/L 22 24 21(L)  Calcium 8.9 - 10.3 mg/dL 9.5 9.9 9.7  Total Protein 6.5 - 8.1 g/dL 7.6 7.8 7.6  Total Bilirubin 0.3 - 1.2 mg/dL 0.5 0.5 0.6  Alkaline Phos 38 - 126 U/L 62 62 55  AST 15 - 41 U/L 38 38 39  ALT  0 - 44 U/L 39 54(H) 40    I personally performed a face-to-face visit.  All questions were answered to patient's stated satisfaction. Encouraged patient to call with any new concerns or questions before his next visit to the cancer center and we can certain see him sooner, if needed.     ASSESSMENT & PLAN:  1. Thrombocytopenia secondary to cirrhosis of the liver: -She has a history of hepatitis C in remission. She is no longer on antiviral therapy. She completed 44 weeks of telaprevir triple based therapy for the treatment of her hepatitis C. -Completion date was January 2012. Treatment was discontinued 4 weeks early due to worsening anemia. -Her HCV RNA 6 months post treatment remain undetectable confirming SVR. -HCV RNA level on 11/04/2012 continue to validate SVR. -Her hepatologist is Dr. Legrand Como fried at [facility]. 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 She denies any further treatment since her last BM transplant. -Platelets dating back to 2016 shows no evidence of thrombocytopenia. -Had a recent MRI of her abdomen on 03/15/2020-stable cirrhosis -Repeat MRI in approximately 3 months per Dr. Maceo Pro -Labs done on 03/14/20 showed her platelet counts 169 -She will follow-up in 1 year with repeat labs.  2.  Hyperkeratosis of right tongue: -Followed by Dr. Luvenia Bautista [facility]) -Had recent biopsy on 8-21 showing superficial invasive squamous cell carcinoma.  It was resected and margins were negative.  3.  Melanoma: -Followed by Dr. Glo Bautista (GYN)  4.  Osteoporosis: -On calcium and vitamin D supplements -Infinity injections -Has history of stress fractures in both feet -Recent vitamin D level 122.15  5.  History of seizures: -On Tegretol -Last seizure was about a year ago  6.  Hyponatremia: -Chronic but improved-sodium 133 -Managed on sodium bicarbonate half tablet twice daily -Increase salt  intake  Disposition: -Return to clinic in 1 year for follow-up with lab work.  No problem-specific Assessment & Plan notes found for this encounter.  Greater than 50% was spent in counseling and coordination of care with this patient including but not limited to discussion of the relevant topics above (See A&P) including, but not limited to diagnosis and management of acute and chronic medical conditions.    Orders placed this encounter:  No orders of the defined types were placed in this encounter.    Faythe Casa, NP 03/22/2020 9:52 AM  [place] [phone]

## 2020-03-21 ENCOUNTER — Ambulatory Visit (HOSPITAL_COMMUNITY): Payer: 59 | Admitting: Nurse Practitioner

## 2020-03-21 DIAGNOSIS — K739 Chronic hepatitis, unspecified: Secondary | ICD-10-CM | POA: Diagnosis not present

## 2020-03-21 DIAGNOSIS — F411 Generalized anxiety disorder: Secondary | ICD-10-CM | POA: Diagnosis not present

## 2020-03-21 DIAGNOSIS — E871 Hypo-osmolality and hyponatremia: Secondary | ICD-10-CM | POA: Diagnosis not present

## 2020-03-21 DIAGNOSIS — E559 Vitamin D deficiency, unspecified: Secondary | ICD-10-CM | POA: Diagnosis not present

## 2020-03-21 DIAGNOSIS — D471 Chronic myeloproliferative disease: Secondary | ICD-10-CM | POA: Diagnosis not present

## 2020-03-21 DIAGNOSIS — E038 Other specified hypothyroidism: Secondary | ICD-10-CM | POA: Diagnosis not present

## 2020-03-21 DIAGNOSIS — M810 Age-related osteoporosis without current pathological fracture: Secondary | ICD-10-CM | POA: Diagnosis not present

## 2020-03-21 DIAGNOSIS — G40201 Localization-related (focal) (partial) symptomatic epilepsy and epileptic syndromes with complex partial seizures, not intractable, with status epilepticus: Secondary | ICD-10-CM | POA: Diagnosis not present

## 2020-03-29 MED FILL — FOLIC ACID 1 MG TABS: 1 | 30 days supply | Qty: 30 | Fill #2

## 2020-03-29 MED FILL — ESTRADIOL 1 MG TABS: 1 | 30 days supply | Qty: 30 | Fill #3

## 2020-04-03 MED FILL — METOPROLOL TARTRATE 25 MG T: 25 | 90 days supply | Qty: 180 | Fill #3

## 2020-04-10 MED FILL — EVENITY 105 MG/1.17ML SOSY: 105 | 28 days supply | Qty: 2 | Fill #6

## 2020-04-17 MED FILL — PROGESTERONE 100 MG CAPS: 100 | 30 days supply | Qty: 15 | Fill #2

## 2020-04-21 DIAGNOSIS — D471 Chronic myeloproliferative disease: Secondary | ICD-10-CM | POA: Diagnosis not present

## 2020-04-21 DIAGNOSIS — M810 Age-related osteoporosis without current pathological fracture: Secondary | ICD-10-CM | POA: Diagnosis not present

## 2020-04-21 DIAGNOSIS — G40201 Localization-related (focal) (partial) symptomatic epilepsy and epileptic syndromes with complex partial seizures, not intractable, with status epilepticus: Secondary | ICD-10-CM | POA: Diagnosis not present

## 2020-04-21 DIAGNOSIS — E038 Other specified hypothyroidism: Secondary | ICD-10-CM | POA: Diagnosis not present

## 2020-04-21 DIAGNOSIS — E559 Vitamin D deficiency, unspecified: Secondary | ICD-10-CM | POA: Diagnosis not present

## 2020-04-21 DIAGNOSIS — F411 Generalized anxiety disorder: Secondary | ICD-10-CM | POA: Diagnosis not present

## 2020-04-21 DIAGNOSIS — E871 Hypo-osmolality and hyponatremia: Secondary | ICD-10-CM | POA: Diagnosis not present

## 2020-04-21 DIAGNOSIS — K739 Chronic hepatitis, unspecified: Secondary | ICD-10-CM | POA: Diagnosis not present

## 2020-04-25 MED FILL — ESTRADIOL 1 MG TABS: 1 | 30 days supply | Qty: 30 | Fill #4

## 2020-04-25 MED FILL — FOLIC ACID 1 MG TABS: 1 | 30 days supply | Qty: 30 | Fill #3

## 2020-05-08 MED FILL — DEXILANT DR 60 MG CAPSULE: 60 | 90 days supply | Qty: 90 | Fill #0

## 2020-05-09 MED FILL — EVENITY 105 MG/1.17ML SOSY: 105 | 28 days supply | Qty: 2 | Fill #7 | Status: TO

## 2020-05-15 ENCOUNTER — Other Ambulatory Visit: Payer: Self-pay | Admitting: Internal Medicine

## 2020-05-15 ENCOUNTER — Other Ambulatory Visit: Payer: Self-pay

## 2020-05-15 ENCOUNTER — Ambulatory Visit (HOSPITAL_BASED_OUTPATIENT_CLINIC_OR_DEPARTMENT_OTHER): Payer: 59 | Admitting: Pharmacist

## 2020-05-15 DIAGNOSIS — Z79899 Other long term (current) drug therapy: Secondary | ICD-10-CM

## 2020-05-15 MED ORDER — EVENITY 105 MG/1.17ML ~~LOC~~ SOSY: PREFILLED_SYRINGE | SUBCUTANEOUS | 3 refills | Status: DC

## 2020-05-15 NOTE — Progress Notes (Signed)
   S: Patient presents today for review of their specialty medication.   Patient is currently taking Evenity for osteoporosis. Patient is managed by Dr.  Arvin Collard for this.   Dosing: 210 mg once monthly x12 months  Adherence: reported  Efficacy: pt believes this is working well for her.   Monitoring:  -S/Sx of hypersensitivity: none -S/Sx of cardiovascular events: none -Calcium levels: WNL 03/14/2020 - BMD: upcoming per pt; she is unable to recall the date  Current adverse effects: - None reported - joint pain: none - edema/HA: none - other side effects: none reported    O:     Lab Results  Component Value Date   WBC 6.8 03/14/2020   HGB 13.8 03/14/2020   HCT 40.7 03/14/2020   MCV 101.0 (H) 03/14/2020   PLT 169 03/14/2020      Chemistry      Component Value Date/Time   NA 133 (L) 03/14/2020 0815   K 4.3 03/14/2020 0815   CL 99 03/14/2020 0815   CO2 22 03/14/2020 0815   BUN 24 (H) 03/14/2020 0815   CREATININE 0.71 03/14/2020 0815      Component Value Date/Time   CALCIUM 9.5 03/14/2020 0815   CALCIUM 8.2 (L) 12/19/2009 1013   ALKPHOS 62 03/14/2020 0815   AST 38 03/14/2020 0815   ALT 39 03/14/2020 0815   BILITOT 0.5 03/14/2020 0815       A/P: 1. Medication review: patient currently on Evenity for osteoporosis and is tolerating it well with no reported adverse effects. Reviewed the medication with the patient. She has been taking this for a total of nine months as this month's injection will be her ninth. She has no questions regarding the medication at this time. No recommendation for any changes at this time.   Benard Halsted, PharmD, [facility], [place] [phone]

## 2020-05-16 MED FILL — PROGESTERONE 100 MG CAPSULE: 100 | 30 days supply | Qty: 15 | Fill #3

## 2020-05-17 DIAGNOSIS — D528 Other folate deficiency anemias: Secondary | ICD-10-CM | POA: Diagnosis not present

## 2020-05-17 DIAGNOSIS — E038 Other specified hypothyroidism: Secondary | ICD-10-CM | POA: Diagnosis not present

## 2020-05-17 DIAGNOSIS — E7800 Pure hypercholesterolemia, unspecified: Secondary | ICD-10-CM | POA: Diagnosis not present

## 2020-05-17 DIAGNOSIS — D471 Chronic myeloproliferative disease: Secondary | ICD-10-CM | POA: Diagnosis not present

## 2020-05-24 DIAGNOSIS — K739 Chronic hepatitis, unspecified: Secondary | ICD-10-CM | POA: Diagnosis not present

## 2020-05-24 DIAGNOSIS — F411 Generalized anxiety disorder: Secondary | ICD-10-CM | POA: Diagnosis not present

## 2020-05-24 DIAGNOSIS — Z1331 Encounter for screening for depression: Secondary | ICD-10-CM | POA: Diagnosis not present

## 2020-05-24 DIAGNOSIS — E559 Vitamin D deficiency, unspecified: Secondary | ICD-10-CM | POA: Diagnosis not present

## 2020-05-24 DIAGNOSIS — M810 Age-related osteoporosis without current pathological fracture: Secondary | ICD-10-CM | POA: Diagnosis not present

## 2020-05-24 DIAGNOSIS — E038 Other specified hypothyroidism: Secondary | ICD-10-CM | POA: Diagnosis not present

## 2020-05-24 DIAGNOSIS — G40201 Localization-related (focal) (partial) symptomatic epilepsy and epileptic syndromes with complex partial seizures, not intractable, with status epilepticus: Secondary | ICD-10-CM | POA: Diagnosis not present

## 2020-05-24 DIAGNOSIS — E871 Hypo-osmolality and hyponatremia: Secondary | ICD-10-CM | POA: Diagnosis not present

## 2020-05-24 DIAGNOSIS — D471 Chronic myeloproliferative disease: Secondary | ICD-10-CM | POA: Diagnosis not present

## 2020-05-29 MED FILL — FOLIC ACID 1 MG TABS: 1 | 30 days supply | Qty: 30 | Fill #4

## 2020-05-29 MED FILL — ESTRADIOL 1 MG TABS: 1 | 30 days supply | Qty: 30 | Fill #5

## 2020-06-05 ENCOUNTER — Other Ambulatory Visit (HOSPITAL_COMMUNITY): Payer: Self-pay | Admitting: Internal Medicine

## 2020-06-06 MED FILL — OLMESARTAN MEDOXOMIL 20 MG: 20 | 30 days supply | Qty: 30 | Fill #0

## 2020-06-06 MED FILL — VASCEPA 1 GM CAPSULE: 1 | 90 days supply | Qty: 360 | Fill #0

## 2020-06-09 DIAGNOSIS — N281 Cyst of kidney, acquired: Secondary | ICD-10-CM | POA: Diagnosis not present

## 2020-06-09 DIAGNOSIS — R772 Abnormality of alphafetoprotein: Secondary | ICD-10-CM | POA: Diagnosis not present

## 2020-06-09 DIAGNOSIS — K7689 Other specified diseases of liver: Secondary | ICD-10-CM | POA: Diagnosis not present

## 2020-06-09 DIAGNOSIS — K7460 Unspecified cirrhosis of liver: Secondary | ICD-10-CM | POA: Diagnosis not present

## 2020-06-13 ENCOUNTER — Other Ambulatory Visit (HOSPITAL_COMMUNITY): Payer: Self-pay | Admitting: Endocrinology

## 2020-06-13 MED FILL — SYNTHROID 75 MCG TABLET: 75 | 30 days supply | Qty: 30 | Fill #0

## 2020-06-13 MED FILL — ROSUVASTATIN CALCIUM 40 MG: 40 | 30 days supply | Qty: 30 | Fill #0

## 2020-06-13 MED FILL — VIT D2 1.25 MG (50,000 UNIT: 1.25 MG | 28 days supply | Qty: 4 | Fill #0

## 2020-06-13 MED FILL — EVENITY 105 MG/1.17ML SOSY: 105 | 28 days supply | Qty: 2 | Fill #0

## 2020-06-19 MED FILL — CARBAMAZEPINE ER 400 MG TAB: 400 | 90 days supply | Qty: 180 | Fill #2

## 2020-06-19 MED FILL — PROGESTERONE 100 MG CAPSULE: 100 | 30 days supply | Qty: 15 | Fill #4

## 2020-06-21 ENCOUNTER — Other Ambulatory Visit (HOSPITAL_COMMUNITY): Payer: Self-pay | Admitting: Hematology

## 2020-06-21 ENCOUNTER — Other Ambulatory Visit (HOSPITAL_COMMUNITY): Payer: Self-pay

## 2020-06-21 DIAGNOSIS — K7460 Unspecified cirrhosis of liver: Secondary | ICD-10-CM

## 2020-06-21 DIAGNOSIS — D696 Thrombocytopenia, unspecified: Secondary | ICD-10-CM

## 2020-06-21 MED ORDER — FOLIC ACID 1 MG PO TABS: 1.0000 mg | ORAL_TABLET | Freq: Every day | ORAL | 4 refills | Status: DC

## 2020-06-22 MED FILL — FOLIC ACID 1 MG TABS: 1 | 30 days supply | Qty: 30 | Fill #0

## 2020-06-22 MED FILL — ESTRADIOL 1 MG TABS: 1 | 30 days supply | Qty: 30 | Fill #6

## 2020-06-23 DIAGNOSIS — B182 Chronic viral hepatitis C: Secondary | ICD-10-CM | POA: Diagnosis not present

## 2020-06-23 DIAGNOSIS — Z6825 Body mass index (BMI) 25.0-25.9, adult: Secondary | ICD-10-CM | POA: Diagnosis not present

## 2020-06-23 DIAGNOSIS — G40309 Generalized idiopathic epilepsy and epileptic syndromes, not intractable, without status epilepticus: Secondary | ICD-10-CM | POA: Diagnosis not present

## 2020-06-23 DIAGNOSIS — G40A09 Absence epileptic syndrome, not intractable, without status epilepticus: Secondary | ICD-10-CM | POA: Diagnosis not present

## 2020-06-23 DIAGNOSIS — G4089 Other seizures: Secondary | ICD-10-CM | POA: Diagnosis not present

## 2020-06-23 DIAGNOSIS — R002 Palpitations: Secondary | ICD-10-CM | POA: Diagnosis not present

## 2020-06-23 DIAGNOSIS — R000 Tachycardia, unspecified: Secondary | ICD-10-CM | POA: Diagnosis not present

## 2020-06-23 DIAGNOSIS — R21 Rash and other nonspecific skin eruption: Secondary | ICD-10-CM | POA: Diagnosis not present

## 2020-06-26 ENCOUNTER — Other Ambulatory Visit: Payer: Self-pay | Admitting: Cardiology

## 2020-06-26 MED FILL — METOPROLOL TARTRATE 25 MG T: 25 | 90 days supply | Qty: 180 | Fill #0

## 2020-06-28 ENCOUNTER — Other Ambulatory Visit (HOSPITAL_COMMUNITY): Payer: Self-pay | Admitting: Endocrinology

## 2020-06-28 DIAGNOSIS — I9589 Other hypotension: Secondary | ICD-10-CM | POA: Diagnosis not present

## 2020-06-28 DIAGNOSIS — F411 Generalized anxiety disorder: Secondary | ICD-10-CM | POA: Diagnosis not present

## 2020-06-28 DIAGNOSIS — E039 Hypothyroidism, unspecified: Secondary | ICD-10-CM | POA: Diagnosis not present

## 2020-06-28 DIAGNOSIS — K7460 Unspecified cirrhosis of liver: Secondary | ICD-10-CM | POA: Diagnosis not present

## 2020-06-28 DIAGNOSIS — M810 Age-related osteoporosis without current pathological fracture: Secondary | ICD-10-CM | POA: Diagnosis not present

## 2020-06-28 DIAGNOSIS — B182 Chronic viral hepatitis C: Secondary | ICD-10-CM | POA: Diagnosis not present

## 2020-06-28 DIAGNOSIS — E871 Hypo-osmolality and hyponatremia: Secondary | ICD-10-CM | POA: Diagnosis not present

## 2020-06-28 DIAGNOSIS — E870 Hyperosmolality and hypernatremia: Secondary | ICD-10-CM | POA: Diagnosis not present

## 2020-06-28 DIAGNOSIS — G40201 Localization-related (focal) (partial) symptomatic epilepsy and epileptic syndromes with complex partial seizures, not intractable, with status epilepticus: Secondary | ICD-10-CM | POA: Diagnosis not present

## 2020-06-28 DIAGNOSIS — E7800 Pure hypercholesterolemia, unspecified: Secondary | ICD-10-CM | POA: Diagnosis not present

## 2020-06-28 DIAGNOSIS — R000 Tachycardia, unspecified: Secondary | ICD-10-CM | POA: Diagnosis not present

## 2020-06-28 DIAGNOSIS — E782 Mixed hyperlipidemia: Secondary | ICD-10-CM | POA: Diagnosis not present

## 2020-06-28 DIAGNOSIS — D696 Thrombocytopenia, unspecified: Secondary | ICD-10-CM | POA: Diagnosis not present

## 2020-06-28 DIAGNOSIS — E559 Vitamin D deficiency, unspecified: Secondary | ICD-10-CM | POA: Diagnosis not present

## 2020-06-28 DIAGNOSIS — E038 Other specified hypothyroidism: Secondary | ICD-10-CM | POA: Diagnosis not present

## 2020-06-28 DIAGNOSIS — D471 Chronic myeloproliferative disease: Secondary | ICD-10-CM | POA: Diagnosis not present

## 2020-06-28 DIAGNOSIS — K739 Chronic hepatitis, unspecified: Secondary | ICD-10-CM | POA: Diagnosis not present

## 2020-06-28 DIAGNOSIS — R7301 Impaired fasting glucose: Secondary | ICD-10-CM | POA: Diagnosis not present

## 2020-07-07 MED FILL — VIT D2 1.25 MG (50,000 UNIT: 1.25 MG | 84 days supply | Qty: 6 | Fill #0

## 2020-07-07 MED FILL — ROSUVASTATIN CALCIUM 40 MG: 40 | 90 days supply | Qty: 90 | Fill #0

## 2020-07-07 MED FILL — SYNTHROID 75 MCG TABLET: 75 | 90 days supply | Qty: 90 | Fill #0

## 2020-07-12 ENCOUNTER — Ambulatory Visit (INDEPENDENT_AMBULATORY_CARE_PROVIDER_SITE_OTHER): Payer: 59

## 2020-07-12 ENCOUNTER — Encounter: Payer: Self-pay | Admitting: Podiatry

## 2020-07-12 ENCOUNTER — Other Ambulatory Visit: Payer: Self-pay

## 2020-07-12 ENCOUNTER — Ambulatory Visit: Payer: 59 | Admitting: Podiatry

## 2020-07-12 DIAGNOSIS — M21619 Bunion of unspecified foot: Secondary | ICD-10-CM

## 2020-07-12 DIAGNOSIS — L600 Ingrowing nail: Secondary | ICD-10-CM

## 2020-07-12 DIAGNOSIS — M79672 Pain in left foot: Secondary | ICD-10-CM | POA: Diagnosis not present

## 2020-07-12 DIAGNOSIS — M79671 Pain in right foot: Secondary | ICD-10-CM | POA: Diagnosis not present

## 2020-07-12 DIAGNOSIS — M21612 Bunion of left foot: Secondary | ICD-10-CM

## 2020-07-12 NOTE — Patient Instructions (Addendum)

## 2020-07-12 NOTE — Progress Notes (Signed)
Subjective:   Patient ID: Latoya Bautista, female   DOB: 50 y.o.   MRN: [id number]   HPI Patient presents with significant ingrown toenail deformity right hallux that is been painful and also with significant bunion deformity right over left that is becoming increasingly sore and making it hard for her to walk or wear shoe gear comfortably.  States she tries wider shoes as best she can and patient does not smoke likes to be active   ROS      Objective:  Physical Exam  Neurovascular status intact with patient found to have incurvated medial border right hallux painful when pressed with no active drainage or redness and is noted to have significant structural bunion deformity right over left with deviation of the hallux redness and pain around the first metatarsal head     Assessment:  Ingrown toenail deformity right hallux along with HAV deformity right over left     Plan:  H&P reviewed conditions recommended correction.  At this point we will focus on the nail and bunion will be done in the next 3 to 4 weeks.  For the nail I allowed her to read and signed consent form and I infiltrated the right hallux 60 mg like Marcaine mixture sterile prep done and using sterile instrumentation remove the medial border exposed matrix applied phenol 3 applications 30 seconds followed by alcohol lavage sterile dressing and gave instructions on soaks.  For the bunion educated on distal osteotomy she wants this done and she will reappoint next week consult and will have this done in the next 3 to 4 weeks after I educated her on recovery  X-rays indicate there is structural bunion deformity significant in nature right over left with elevation of the intermetatarsal angle of about 16 degrees

## 2020-07-17 ENCOUNTER — Other Ambulatory Visit (HOSPITAL_COMMUNITY): Payer: Self-pay | Admitting: Internal Medicine

## 2020-07-21 ENCOUNTER — Encounter: Payer: Self-pay | Admitting: Podiatry

## 2020-07-21 ENCOUNTER — Ambulatory Visit: Payer: 59 | Admitting: Podiatry

## 2020-07-21 ENCOUNTER — Other Ambulatory Visit: Payer: Self-pay

## 2020-07-21 DIAGNOSIS — M21611 Bunion of right foot: Secondary | ICD-10-CM

## 2020-07-21 DIAGNOSIS — L600 Ingrowing nail: Secondary | ICD-10-CM

## 2020-07-21 DIAGNOSIS — M21619 Bunion of unspecified foot: Secondary | ICD-10-CM

## 2020-07-23 NOTE — Progress Notes (Signed)
Subjective:   Patient ID: Latoya Bautista, female   DOB: 50 y.o.   MRN: [id number]   HPI Patient states doing well with the ingrown toenail and is ready to get her bunion fixed   ROS      Objective:  Physical Exam  Neurovascular status intact with patient's nail healing well with large structural deformity of the first metatarsal head right with redness around the surface and also pain left not to the same degree     Assessment:  HAV right over left symptomatic painful when palpated with no response conservatively to wider shoes soaks and anti-inflammatories along with well corrected ingrown toenail H     Plan:  P reviewed all conditions and I have recommended structural correction of deformity and allow her to read consent form going over all possible complications and alternative treatments.  She understands no guarantee as far as success wants surgery signed consent form and is given all preoperative instructions at the current time  X-rays indicate that there is significant structural malalignment and I also went ahead today and I did dispense air fracture walker for the postoperative process that I want her to start wearing out and get used to it

## 2020-07-27 DIAGNOSIS — E871 Hypo-osmolality and hyponatremia: Secondary | ICD-10-CM | POA: Diagnosis not present

## 2020-07-27 DIAGNOSIS — M810 Age-related osteoporosis without current pathological fracture: Secondary | ICD-10-CM | POA: Diagnosis not present

## 2020-07-27 DIAGNOSIS — D471 Chronic myeloproliferative disease: Secondary | ICD-10-CM | POA: Diagnosis not present

## 2020-07-27 DIAGNOSIS — G40201 Localization-related (focal) (partial) symptomatic epilepsy and epileptic syndromes with complex partial seizures, not intractable, with status epilepticus: Secondary | ICD-10-CM | POA: Diagnosis not present

## 2020-07-27 DIAGNOSIS — E559 Vitamin D deficiency, unspecified: Secondary | ICD-10-CM | POA: Diagnosis not present

## 2020-07-27 DIAGNOSIS — E038 Other specified hypothyroidism: Secondary | ICD-10-CM | POA: Diagnosis not present

## 2020-07-27 DIAGNOSIS — F411 Generalized anxiety disorder: Secondary | ICD-10-CM | POA: Diagnosis not present

## 2020-07-27 DIAGNOSIS — K739 Chronic hepatitis, unspecified: Secondary | ICD-10-CM | POA: Diagnosis not present

## 2020-07-31 MED FILL — DEXLANSOPRAZOLE 60 MG CPDR: 60 | 90 days supply | Qty: 90 | Fill #1

## 2020-08-02 ENCOUNTER — Telehealth: Payer: Self-pay | Admitting: Urology

## 2020-08-02 NOTE — Telephone Encounter (Signed)
DOS: 08/15/20  [place] BUNION ECTOMY RIGHT --- 53614   [facility] EFFECTIVE DATE - 05/06/20  PLAN DEDUCTIBLE - $300.00 W/ $300.00 REMAINING OUT OF POCKET - $7900.00 W/ $7,870.00 REMAINING COPAY - $0.00 CONINSURANCE - 40%    SPOKE WITH DAWN WITH [facility] AND SHE STATED THAT NO PRIOR AUTH IS REQUIRED FOR CPT CODE 43154.  REF #[id number]

## 2020-08-05 ENCOUNTER — Other Ambulatory Visit (HOSPITAL_COMMUNITY): Payer: Self-pay

## 2020-08-07 ENCOUNTER — Other Ambulatory Visit (HOSPITAL_COMMUNITY): Payer: Self-pay

## 2020-08-14 ENCOUNTER — Other Ambulatory Visit: Payer: Self-pay | Admitting: Obstetrics & Gynecology

## 2020-08-14 ENCOUNTER — Other Ambulatory Visit (HOSPITAL_COMMUNITY): Payer: Self-pay

## 2020-08-14 MED ORDER — ONDANSETRON HCL 4 MG PO TABS
4.0000 mg | ORAL_TABLET | Freq: Three times a day (TID) | ORAL | 0 refills | Status: DC | PRN
  Filled 2020-08-14: qty 20, 7d supply, fill #0

## 2020-08-14 MED ORDER — OXYCODONE-ACETAMINOPHEN 10-325 MG PO TABS
1.0000 | ORAL_TABLET | ORAL | 0 refills | Status: DC | PRN
  Filled 2020-08-14: qty 25, 4d supply, fill #0

## 2020-08-14 MED ORDER — PROGESTERONE MICRONIZED 100 MG PO CAPS
100.0000 mg | ORAL_CAPSULE | Freq: Every day | ORAL | 5 refills | Status: DC
  Filled 2020-08-14: qty 15, 15d supply, fill #0
  Filled 2020-09-18: qty 15, 15d supply, fill #1
  Filled 2020-10-19: qty 15, 15d supply, fill #2
  Filled 2020-11-20: qty 15, 15d supply, fill #3
  Filled 2020-12-22: qty 15, 15d supply, fill #4

## 2020-08-14 MED FILL — Olmesartan Medoxomil Tab 20 MG: ORAL | 30 days supply | Qty: 30 | Fill #0 | Status: AC

## 2020-08-14 MED FILL — Estradiol Tab 1 MG: ORAL | 30 days supply | Qty: 30 | Fill #0 | Status: AC

## 2020-08-14 MED FILL — Folic Acid Tab 1 MG: ORAL | 30 days supply | Qty: 30 | Fill #0 | Status: AC

## 2020-08-14 NOTE — Addendum Note (Signed)
Addended by: Wallene Huh on: 08/14/2020 05:15 PM   Modules accepted: Orders

## 2020-08-15 ENCOUNTER — Encounter: Payer: Self-pay | Admitting: Podiatry

## 2020-08-15 ENCOUNTER — Telehealth: Payer: Self-pay | Admitting: *Deleted

## 2020-08-15 ENCOUNTER — Other Ambulatory Visit (HOSPITAL_COMMUNITY): Payer: Self-pay

## 2020-08-15 DIAGNOSIS — M2011 Hallux valgus (acquired), right foot: Secondary | ICD-10-CM | POA: Diagnosis not present

## 2020-08-15 NOTE — Telephone Encounter (Signed)
yes

## 2020-08-15 NOTE — Telephone Encounter (Signed)
Patient is calling with concerns of on/off numbness to foot after foot surgery today.Is this normal? Please advise.

## 2020-08-16 ENCOUNTER — Other Ambulatory Visit (HOSPITAL_COMMUNITY): Payer: Self-pay

## 2020-08-16 MED FILL — Romosozumab-aqqg Inj Soln Prefilled Syringe 105 MG/1.17ML: SUBCUTANEOUS | 28 days supply | Qty: 2.34 | Fill #0 | Status: AC

## 2020-08-16 NOTE — Telephone Encounter (Signed)
Called patient and left Vmessage for return call.

## 2020-08-16 NOTE — Telephone Encounter (Signed)
Patient returning call back and was given information about her foot. She verbalized understanding and said that she is doing and feeling pretty good w/ just taking tylenol.

## 2020-08-17 ENCOUNTER — Other Ambulatory Visit (HOSPITAL_COMMUNITY): Payer: Self-pay

## 2020-08-18 ENCOUNTER — Other Ambulatory Visit (HOSPITAL_COMMUNITY): Payer: Self-pay

## 2020-08-21 ENCOUNTER — Ambulatory Visit (INDEPENDENT_AMBULATORY_CARE_PROVIDER_SITE_OTHER): Payer: 59 | Admitting: Podiatry

## 2020-08-21 ENCOUNTER — Other Ambulatory Visit: Payer: Self-pay

## 2020-08-21 ENCOUNTER — Ambulatory Visit (INDEPENDENT_AMBULATORY_CARE_PROVIDER_SITE_OTHER): Payer: 59

## 2020-08-21 ENCOUNTER — Other Ambulatory Visit (HOSPITAL_COMMUNITY): Payer: Self-pay

## 2020-08-21 ENCOUNTER — Encounter: Payer: Self-pay | Admitting: Podiatry

## 2020-08-21 DIAGNOSIS — M79676 Pain in unspecified toe(s): Secondary | ICD-10-CM

## 2020-08-21 DIAGNOSIS — Z9889 Other specified postprocedural states: Secondary | ICD-10-CM

## 2020-08-21 NOTE — Progress Notes (Signed)
Subjective:   Patient ID: Latoya Bautista, female   DOB: 50 y.o.   MRN: [id number]   HPI Patient states doing very well with minimal discomfort very pleased so far with surgical result   ROS      Objective:  Physical Exam  Neurovascular status intact negative Bevelyn Buckles' sign noted right foot healing well wound edges well coapted hallux rectus position good alignment noted     Assessment:  Doing well post osteotomy first metatarsal right foot     Plan:  H&P x-ray reviewed recommend continued compression elevation and immobilization range of motion exercises and ice ibuprofen as needed.  Patient will be seen back 2 weeks encouraged to call with questions concerns  X-rays indicate that there is good healing of the osteotomy fixation in place joint congruence

## 2020-08-23 ENCOUNTER — Other Ambulatory Visit (HOSPITAL_COMMUNITY): Payer: Self-pay

## 2020-08-28 DIAGNOSIS — G40201 Localization-related (focal) (partial) symptomatic epilepsy and epileptic syndromes with complex partial seizures, not intractable, with status epilepticus: Secondary | ICD-10-CM | POA: Diagnosis not present

## 2020-08-28 DIAGNOSIS — E871 Hypo-osmolality and hyponatremia: Secondary | ICD-10-CM | POA: Diagnosis not present

## 2020-08-28 DIAGNOSIS — F411 Generalized anxiety disorder: Secondary | ICD-10-CM | POA: Diagnosis not present

## 2020-08-28 DIAGNOSIS — E038 Other specified hypothyroidism: Secondary | ICD-10-CM | POA: Diagnosis not present

## 2020-08-28 DIAGNOSIS — D471 Chronic myeloproliferative disease: Secondary | ICD-10-CM | POA: Diagnosis not present

## 2020-08-28 DIAGNOSIS — M810 Age-related osteoporosis without current pathological fracture: Secondary | ICD-10-CM | POA: Diagnosis not present

## 2020-08-28 DIAGNOSIS — K739 Chronic hepatitis, unspecified: Secondary | ICD-10-CM | POA: Diagnosis not present

## 2020-08-28 DIAGNOSIS — E559 Vitamin D deficiency, unspecified: Secondary | ICD-10-CM | POA: Diagnosis not present

## 2020-09-04 ENCOUNTER — Other Ambulatory Visit (HOSPITAL_COMMUNITY): Payer: Self-pay

## 2020-09-04 ENCOUNTER — Other Ambulatory Visit (HOSPITAL_COMMUNITY): Payer: Self-pay | Admitting: Endocrinology

## 2020-09-04 MED FILL — Carbamazepine Tab ER 12HR 400 MG: ORAL | 90 days supply | Qty: 180 | Fill #0 | Status: AC

## 2020-09-04 MED FILL — Rosuvastatin Calcium Tab 40 MG: ORAL | 90 days supply | Qty: 90 | Fill #0 | Status: CN

## 2020-09-06 ENCOUNTER — Other Ambulatory Visit (HOSPITAL_COMMUNITY): Payer: Self-pay

## 2020-09-11 ENCOUNTER — Ambulatory Visit (INDEPENDENT_AMBULATORY_CARE_PROVIDER_SITE_OTHER): Payer: 59

## 2020-09-11 ENCOUNTER — Other Ambulatory Visit: Payer: Self-pay

## 2020-09-11 ENCOUNTER — Ambulatory Visit (INDEPENDENT_AMBULATORY_CARE_PROVIDER_SITE_OTHER): Payer: 59 | Admitting: Podiatry

## 2020-09-11 ENCOUNTER — Encounter: Payer: Self-pay | Admitting: Podiatry

## 2020-09-11 DIAGNOSIS — Z9889 Other specified postprocedural states: Secondary | ICD-10-CM

## 2020-09-12 NOTE — Progress Notes (Signed)
Subjective:   Patient ID: Latoya Bautista, female   DOB: 50 y.o.   MRN: [id number]   HPI Patient states doing very well with foot very pleased   ROS      Objective:  Physical Exam  Neurovascular status intact negative Bevelyn Buckles' sign noted right foot healing well wound edges well coapted good alignment     Assessment:  Doing well post osteotomy right first metatarsal     Plan:  H&P x-rays reviewed sterile stocking compression dispensed and reappoint to recheck with gradual increase in activity over the next several weeks  X-rays indicate that the osteotomy is healing well fixation in place good reduction of deformity

## 2020-09-13 DIAGNOSIS — F411 Generalized anxiety disorder: Secondary | ICD-10-CM | POA: Diagnosis not present

## 2020-09-13 DIAGNOSIS — E559 Vitamin D deficiency, unspecified: Secondary | ICD-10-CM | POA: Diagnosis not present

## 2020-09-13 DIAGNOSIS — K739 Chronic hepatitis, unspecified: Secondary | ICD-10-CM | POA: Diagnosis not present

## 2020-09-13 DIAGNOSIS — E038 Other specified hypothyroidism: Secondary | ICD-10-CM | POA: Diagnosis not present

## 2020-09-13 DIAGNOSIS — M810 Age-related osteoporosis without current pathological fracture: Secondary | ICD-10-CM | POA: Diagnosis not present

## 2020-09-13 DIAGNOSIS — N951 Menopausal and female climacteric states: Secondary | ICD-10-CM | POA: Diagnosis not present

## 2020-09-13 DIAGNOSIS — D471 Chronic myeloproliferative disease: Secondary | ICD-10-CM | POA: Diagnosis not present

## 2020-09-13 DIAGNOSIS — G40201 Localization-related (focal) (partial) symptomatic epilepsy and epileptic syndromes with complex partial seizures, not intractable, with status epilepticus: Secondary | ICD-10-CM | POA: Diagnosis not present

## 2020-09-18 ENCOUNTER — Other Ambulatory Visit (HOSPITAL_COMMUNITY): Payer: Self-pay

## 2020-09-18 MED FILL — Folic Acid Tab 1 MG: ORAL | 30 days supply | Qty: 30 | Fill #1 | Status: AC

## 2020-09-18 MED FILL — Romosozumab-aqqg Inj Soln Prefilled Syringe 105 MG/1.17ML: SUBCUTANEOUS | 28 days supply | Qty: 2.34 | Fill #1 | Status: AC

## 2020-09-18 MED FILL — Olmesartan Medoxomil Tab 20 MG: ORAL | 30 days supply | Qty: 30 | Fill #1 | Status: AC

## 2020-09-18 MED FILL — Rosuvastatin Calcium Tab 40 MG: ORAL | 90 days supply | Qty: 90 | Fill #0 | Status: AC

## 2020-09-18 MED FILL — Estradiol Tab 1 MG: ORAL | 30 days supply | Qty: 30 | Fill #1 | Status: AC

## 2020-09-20 ENCOUNTER — Other Ambulatory Visit (HOSPITAL_COMMUNITY): Payer: Self-pay

## 2020-09-20 ENCOUNTER — Encounter: Payer: Self-pay | Admitting: Podiatry

## 2020-09-22 ENCOUNTER — Other Ambulatory Visit (HOSPITAL_COMMUNITY): Payer: Self-pay

## 2020-09-25 ENCOUNTER — Other Ambulatory Visit (HOSPITAL_COMMUNITY): Payer: Self-pay

## 2020-09-25 MED ORDER — ICOSAPENT ETHYL 1 G PO CAPS
2.0000 g | ORAL_CAPSULE | Freq: Two times a day (BID) | ORAL | 0 refills | Status: DC
  Filled 2020-09-25: qty 360, 90d supply, fill #0

## 2020-09-25 MED FILL — Metoprolol Tartrate Tab 25 MG: ORAL | 90 days supply | Qty: 180 | Fill #0 | Status: AC

## 2020-09-26 ENCOUNTER — Other Ambulatory Visit (HOSPITAL_COMMUNITY): Payer: Self-pay

## 2020-09-29 ENCOUNTER — Other Ambulatory Visit (HOSPITAL_COMMUNITY): Payer: Self-pay

## 2020-09-29 DIAGNOSIS — D471 Chronic myeloproliferative disease: Secondary | ICD-10-CM | POA: Diagnosis not present

## 2020-09-29 DIAGNOSIS — G40201 Localization-related (focal) (partial) symptomatic epilepsy and epileptic syndromes with complex partial seizures, not intractable, with status epilepticus: Secondary | ICD-10-CM | POA: Diagnosis not present

## 2020-09-29 DIAGNOSIS — E038 Other specified hypothyroidism: Secondary | ICD-10-CM | POA: Diagnosis not present

## 2020-09-29 DIAGNOSIS — M810 Age-related osteoporosis without current pathological fracture: Secondary | ICD-10-CM | POA: Diagnosis not present

## 2020-09-29 DIAGNOSIS — F411 Generalized anxiety disorder: Secondary | ICD-10-CM | POA: Diagnosis not present

## 2020-09-29 DIAGNOSIS — K739 Chronic hepatitis, unspecified: Secondary | ICD-10-CM | POA: Diagnosis not present

## 2020-09-29 DIAGNOSIS — E559 Vitamin D deficiency, unspecified: Secondary | ICD-10-CM | POA: Diagnosis not present

## 2020-09-29 DIAGNOSIS — E7800 Pure hypercholesterolemia, unspecified: Secondary | ICD-10-CM | POA: Diagnosis not present

## 2020-09-29 DIAGNOSIS — E871 Hypo-osmolality and hyponatremia: Secondary | ICD-10-CM | POA: Diagnosis not present

## 2020-09-29 MED ORDER — VITAMIN D (ERGOCALCIFEROL) 1.25 MG (50000 UNIT) PO CAPS
50000.0000 [IU] | ORAL_CAPSULE | ORAL | 1 refills | Status: DC
  Filled 2020-09-29: qty 6, 84d supply, fill #0
  Filled 2021-03-12: qty 6, 84d supply, fill #1

## 2020-09-29 MED ORDER — ROSUVASTATIN CALCIUM 40 MG PO TABS
40.0000 mg | ORAL_TABLET | Freq: Every day | ORAL | 1 refills | Status: DC
  Filled 2020-09-29 – 2021-01-03 (×2): qty 90, 90d supply, fill #0
  Filled 2021-03-12 – 2021-06-18 (×2): qty 90, 90d supply, fill #1

## 2020-09-29 MED ORDER — SYNTHROID 75 MCG PO TABS
75.0000 ug | ORAL_TABLET | Freq: Every day | ORAL | 1 refills | Status: DC
  Filled 2020-09-29: qty 90, 90d supply, fill #0
  Filled 2021-02-19: qty 90, 90d supply, fill #1

## 2020-09-29 MED ORDER — PROLIA 60 MG/ML ~~LOC~~ SOSY
60.0000 mg | PREFILLED_SYRINGE | SUBCUTANEOUS | 0 refills | Status: DC
  Filled 2020-09-29 – 2021-01-17 (×5): qty 1, 180d supply, fill #0

## 2020-10-05 ENCOUNTER — Ambulatory Visit (INDEPENDENT_AMBULATORY_CARE_PROVIDER_SITE_OTHER): Payer: 59 | Admitting: Podiatry

## 2020-10-05 ENCOUNTER — Encounter: Payer: Self-pay | Admitting: Podiatry

## 2020-10-05 ENCOUNTER — Other Ambulatory Visit: Payer: Self-pay

## 2020-10-05 ENCOUNTER — Ambulatory Visit (INDEPENDENT_AMBULATORY_CARE_PROVIDER_SITE_OTHER): Payer: 59

## 2020-10-05 DIAGNOSIS — M2011 Hallux valgus (acquired), right foot: Secondary | ICD-10-CM

## 2020-10-05 DIAGNOSIS — Z9889 Other specified postprocedural states: Secondary | ICD-10-CM

## 2020-10-05 NOTE — Progress Notes (Signed)
   Subjective:   Patient ID: Latoya Bautista, female   DOB: 50 y.o.   MRN: [id number]   HPI Patient states doing really well with surgery with minimal discomfort wearing shoe gear normally at this time and does have a thickened big toenail right she was concerned about   ROS      Objective:  Physical Exam  Neurovascular status intact negative Bevelyn Buckles' sign noted wound edges well coapted right good alignment noted excellent range of motion thickness of the right hallux nail     Assessment:  Doing well post osteotomy right first metatarsal ingrown toenail right     Plan:  H&P final x-ray reviewed recommended gradual return to all normal activity and continued elevation as needed.  Patient will be seen back as needed for condition x-rays confirming good alignment fixation in place do not recommend ingrown toenail correction may be necessary in future

## 2020-10-09 ENCOUNTER — Other Ambulatory Visit (HOSPITAL_COMMUNITY): Payer: Self-pay

## 2020-10-12 ENCOUNTER — Other Ambulatory Visit (HOSPITAL_COMMUNITY): Payer: Self-pay

## 2020-10-16 ENCOUNTER — Encounter: Payer: 59 | Admitting: Podiatry

## 2020-10-16 ENCOUNTER — Other Ambulatory Visit (HOSPITAL_COMMUNITY): Payer: Self-pay

## 2020-10-19 ENCOUNTER — Other Ambulatory Visit (HOSPITAL_COMMUNITY): Payer: Self-pay

## 2020-10-23 ENCOUNTER — Other Ambulatory Visit (HOSPITAL_COMMUNITY): Payer: Self-pay

## 2020-10-23 MED ORDER — OLMESARTAN MEDOXOMIL 20 MG PO TABS
20.0000 mg | ORAL_TABLET | Freq: Every day | ORAL | 0 refills | Status: DC
  Filled 2020-10-23: qty 90, 90d supply, fill #0

## 2020-10-23 MED FILL — Dexlansoprazole Cap Delayed Release 60 MG: ORAL | 90 days supply | Qty: 90 | Fill #0 | Status: AC

## 2020-10-23 MED FILL — Folic Acid Tab 1 MG: ORAL | 30 days supply | Qty: 30 | Fill #2 | Status: AC

## 2020-10-23 MED FILL — Estradiol Tab 1 MG: ORAL | 30 days supply | Qty: 30 | Fill #2 | Status: AC

## 2020-11-15 ENCOUNTER — Other Ambulatory Visit (HOSPITAL_COMMUNITY): Payer: Self-pay

## 2020-11-15 ENCOUNTER — Other Ambulatory Visit: Payer: Self-pay

## 2020-11-15 ENCOUNTER — Ambulatory Visit: Payer: 59 | Admitting: Cardiology

## 2020-11-15 ENCOUNTER — Encounter: Payer: Self-pay | Admitting: Cardiology

## 2020-11-15 VITALS — BP 136/78 | HR 95 | Ht 59.0 in | Wt 124.0 lb

## 2020-11-15 DIAGNOSIS — R002 Palpitations: Secondary | ICD-10-CM

## 2020-11-15 MED ORDER — METOPROLOL TARTRATE 25 MG PO TABS
37.5000 mg | ORAL_TABLET | Freq: Two times a day (BID) | ORAL | 3 refills | Status: DC
  Filled 2020-11-15: qty 270, 90d supply, fill #0
  Filled 2021-03-12: qty 270, 90d supply, fill #1
  Filled 2021-06-04: qty 270, 90d supply, fill #2
  Filled 2021-08-27: qty 270, 90d supply, fill #3

## 2020-11-15 NOTE — Progress Notes (Signed)
Clinical Summary Ms. Gonsalves is a 50 y.o.female seen today for follow up of the following medical problems.      1. 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 07/2017: echo showing EF of 55-60%, Grade 1 DD, and no significant valve abnormalities.    Hepatitis C    Hepatitis C 04/11/2011   Leukemia ([place])    Liver cirrhosis ([place]) 04/11/2011   Osteoporosis    Seizures ([facility])    Shingles    Thrombocytopenia ([facility]) 04/11/2011   Thyroid disease      Allergies  Allergen Reactions   Simvastatin Rash   Briviact [Brivaracetam]     Elevated LFT's   Keppra [Levetiracetam]     Rash   Asparaginase Derivatives Rash   Elspar [Asparaginase] Rash   Soap Rash    Procedure prep soap     Current Outpatient Medications  Medication Sig Dispense Refill   carbamazepine (TEGRETOL XR) 400 MG 12 hr tablet carbamazepine ER 400 mg tablet,extended release,12 hr     carbamazepine (TEGRETOL XR) 400 MG 12 hr tablet TAKE 1 TABLET (400 MG TOTAL) BY MOUTH TWO (2) TIMES A DAY. 180 tablet 3   cholecalciferol (VITAMIN D) 1000 units tablet Take 1,000 Units by mouth daily.     denosumab (PROLIA) 60 MG/ML SOSY injection Recieve injection of 60mg  every 6 months subcutaneously. To be administered by MD 1 mL 0   DEXILANT 60 MG capsule Take 1 capsule by mouth daily.  0   dexlansoprazole (DEXILANT) 60 MG capsule TAKE 1 CAPSULE BY MOUTH ONCE DAILY 90 capsule 3   diclofenac Sodium (VOLTAREN) 1 % GEL Apply 2 g topically 4 (four) times daily. 100 g 0   estradiol (ESTRACE) 1 MG tablet TAKE 1 TABLET BY MOUTH DAILY. 30 tablet 10   EVENITY 105 MG/1.17ML SOSY injection INJECT TWO DOSES  INTO THE SKIN ONCE A MONTH FOR 12 MONTHS 2.68 mL 2   folic acid (FOLVITE) 1 MG tablet TAKE 1 TABLET (1 MG TOTAL) BY MOUTH DAILY. 30 tablet 4   Garlic 3419 MG CAPS Take 1 capsule by mouth daily.     icosapent Ethyl (VASCEPA) 1 g capsule TAKE 2 CAPSULES BY MOUTH TWICE A DAY 360 capsule 0   levocetirizine (XYZAL) 5 MG tablet Take by mouth.     levothyroxine (SYNTHROID, LEVOTHROID) 75 MCG tablet Take 75 mcg by mouth daily before breakfast.      metoprolol tartrate (LOPRESSOR) 25 MG tablet TAKE 1 TABLET BY MOUTH TWICE A DAY 180 tablet 3   MODERNA COVID-19 VACCINE 100 MCG/0.5ML injection      Multiple Vitamins-Minerals (CENTRUM PO) Take 1 tablet by mouth daily.       olmesartan (BENICAR) 20 MG tablet Take 1 tablet (20 mg total) by mouth daily. 90 tablet 0   ondansetron (ZOFRAN) 4 MG tablet Take 1 tablet (4 mg total) by mouth every 8 (eight) hours as needed for nausea or vomiting. 20 tablet 0   oxyCODONE-acetaminophen (PERCOCET) 10-325 MG tablet Take 1 tablet by mouth every 4 (four) hours as needed for pain. 25  tablet 0   progesterone (PROMETRIUM) 100 MG capsule Take 1 capsule (100 mg total) by mouth daily for 15 days per month. 15 capsule 5   rosuvastatin (CRESTOR) 40 MG tablet Take 40 mg by mouth every evening.      rosuvastatin (CRESTOR) 40 MG tablet TAKE 1 TABLET BY MOUTH DAILY AT BEDTIME 30 tablet 0   rosuvastatin (CRESTOR) 40 MG tablet TAKE 1 TABLET BY MOUTH ONCE DAILY AT BEDTIME 90 tablet 0   rosuvastatin (CRESTOR) 40 MG tablet Take 1 tablet (40 mg total) by mouth at bedtime. 90 tablet 1   sodium bicarbonate 325 MG tablet Take by mouth.     SYNTHROID 75 MCG tablet TAKE 1 TABLET BY MOUTH DAILY 90 tablet 1   SYNTHROID 75 MCG tablet TAKE 1 TABLET BY MOUTH ONCE DAILY. 30 tablet 0   SYNTHROID 75 MCG tablet TAKE 1 TABLET BY MOUTH ONCE DAILY 90 tablet 0   SYNTHROID 75 MCG tablet Take 1 tablet (75 mcg total) by mouth daily. 90 tablet 1   VASCEPA 1 g capsule Take 2 g by mouth 2 (two) times daily.      vitamin B-12 (CYANOCOBALAMIN) 1000 MCG tablet Take 1,000 mcg by mouth daily.     vitamin C (ASCORBIC ACID) 500 MG tablet Take 500 mg by mouth daily.      Vitamin D, Ergocalciferol, (DRISDOL) 1.25 MG (50000 UNIT) CAPS capsule Take 1 capsule (50,000 Units total) by mouth every 14 (fourteen) days. 20 capsule 1   vitamin E 400 UNIT capsule Take 400 Units by mouth 2 (two) times daily.     No current facility-administered medications for this visit.     Past Surgical History:  Procedure Laterality Date   BONE MARROW BIOPSY  05/2011   BONE MARROW TRANSPLANT     CHOLECYSTECTOMY     liver biopsie     TONGUE BIOPSY     tongue cancer     surgical removal of area     Allergies  Allergen Reactions   Simvastatin Rash   Briviact [Brivaracetam]     Elevated LFT's   Keppra [Levetiracetam]     Rash   Asparaginase Derivatives Rash   Elspar [Asparaginase] Rash   Soap Rash    Procedure prep soap      Family History  Problem Relation Age of Onset   Hypertension Father    Diabetes Maternal Uncle      Social History Ms. Going reports that she has never smoked. She has never used smokeless tobacco. Ms. Borras reports no history of alcohol use.   Review of Systems CONSTITUTIONAL: No weight loss, fever, chills, weakness or fatigue.  HEENT: Eyes: No visual loss, blurred vision, double vision or yellow sclerae.No hearing loss, sneezing, congestion, runny nose or sore throat.  SKIN: No rash or itching.  CARDIOVASCULAR: per hpi RESPIRATORY: No shortness of breath, cough or sputum.  GASTROINTESTINAL: No anorexia, nausea, vomiting or diarrhea. No abdominal pain or blood.  GENITOURINARY: No burning on urination, no polyuria NEUROLOGICAL: No headache, dizziness, syncope, paralysis, ataxia, numbness or tingling in the extremities. No change in bowel or bladder control.  MUSCULOSKELETAL: No muscle, back pain, joint pain or stiffness.  LYMPHATICS: No enlarged nodes. No history of splenectomy.   PSYCHIATRIC: No history of depression or anxiety.  ENDOCRINOLOGIC: No reports of sweating, cold or heat intolerance. No polyuria or polydipsia.  Marland Kitchen   Physical Examination Today's Vitals   11/15/20 1247  BP: 136/78  Pulse: 95  SpO2: 96%  Weight:  124 lb (56.2 kg)  Height: $Remove'4\' 11"'GWpLTxa$  (1.499 m)   Body mass index is 25.04 kg/m.  Gen: resting comfortably, no acute distress HEENT: no scleral icterus, pupils equal round and reactive, no palptable cervical adenopathy,  CV: RRR, no m/r/g no jvd Resp: Clear to auscultation bilaterally GI: abdomen is soft, non-tender, non-distended, normal bowel sounds, no hepatosplenomegaly MSK: extremities are warm, no edema.  Skin: warm, no rash Neuro:  no focal deficits Psych: appropriate affect   Diagnostic Studies  07/2017 echo Study Conclusions   - Left ventricle: The cavity size was normal. Wall thickness was   normal. Systolic function was normal. The estimated ejection   fraction was in the range of 55% to 60%. Wall motion was normal;   there were no regional wall motion abnormalities. Doppler   parameters are consistent with abnormal left ventricular   relaxation (grade 1 diastolic dysfunction). - Aortic valve: Valve area (VTI): 2.1 cm^2. Valve area (Vmax): 2.43   cm^2. Valve area (Vmean): 2.5 cm^2. - Technically adequate study.     08/2017 event monitor 14 day event monitor Min HR 71, Max HR 173, Avg HR 102 Reported symptoms correlated with sinus rhythm and sinus tachycardia. Rare PACs and PVCs No significant arrhythmias   Assessment and Plan  1. Palpitations - event monitor with only benign ectopy, no significant arrhythmias - some recent symptoms, we will increase lopressor to 37.$RemoveBeforeD'5mg'[id number]$  bid and monitor symptoms   F/u 1 year  Arnoldo Lenis, M.D.

## 2020-11-15 NOTE — Patient Instructions (Signed)
Medication Instructions:  Your physician has recommended you make the following change in your medication:  INCREASE Lopressor to 37.5 mg twice daily  *If you need a refill on your cardiac medications before your next appointment, please call your pharmacy*   Lab Work: We have requested your most recent lab work from your primary care provider.  If you have labs (blood work) drawn today and your tests are completely normal, you will receive your results only by: [place] (if you have MyChart) OR A paper copy in the mail If you have any lab test that is abnormal or we need to change your treatment, we will call you to review the results.   Testing/Procedures: None   Follow-Up: At [facility], you and your health needs are our priority.  As part of our continuing mission to provide you with exceptional heart care, we have created designated Provider Care Teams.  These Care Teams include your primary Cardiologist (physician) and Advanced Practice Providers (APPs -  Physician Assistants and Nurse Practitioners) who all work together to provide you with the care you need, when you need it.  We recommend signing up for the patient portal called "MyChart".  Sign up information is provided on this After Visit Summary.  MyChart is used to connect with patients for Virtual Visits (Telemedicine).  Patients are able to view lab/test results, encounter notes, upcoming appointments, etc.  Non-urgent messages can be sent to your provider as well.   To learn more about what you can do with MyChart, go to [email or web address].    Your next appointment:   1 year(s)  The format for your next appointment:   In Person  Provider:   Carlyle Dolly, MD   Other Instructions

## 2020-11-16 DIAGNOSIS — C021 Malignant neoplasm of border of tongue: Secondary | ICD-10-CM | POA: Diagnosis not present

## 2020-11-20 ENCOUNTER — Other Ambulatory Visit (HOSPITAL_COMMUNITY): Payer: Self-pay | Admitting: Hematology

## 2020-11-20 ENCOUNTER — Other Ambulatory Visit: Payer: Self-pay | Admitting: Obstetrics and Gynecology

## 2020-11-20 ENCOUNTER — Other Ambulatory Visit (HOSPITAL_COMMUNITY): Payer: Self-pay

## 2020-11-20 DIAGNOSIS — D696 Thrombocytopenia, unspecified: Secondary | ICD-10-CM

## 2020-11-20 DIAGNOSIS — K7460 Unspecified cirrhosis of liver: Secondary | ICD-10-CM

## 2020-11-20 MED ORDER — FOLIC ACID 1 MG PO TABS
1.0000 mg | ORAL_TABLET | Freq: Every day | ORAL | 4 refills | Status: DC
  Filled 2020-11-20: qty 30, 30d supply, fill #0
  Filled 2020-12-22: qty 30, 30d supply, fill #1
  Filled 2021-01-24: qty 30, 30d supply, fill #2
  Filled 2021-02-19: qty 30, 30d supply, fill #3
  Filled 2021-03-12 – 2021-03-19 (×2): qty 30, 30d supply, fill #4

## 2020-11-22 ENCOUNTER — Other Ambulatory Visit (HOSPITAL_COMMUNITY): Payer: Self-pay

## 2020-11-23 ENCOUNTER — Other Ambulatory Visit (HOSPITAL_COMMUNITY): Payer: Self-pay

## 2020-11-23 ENCOUNTER — Other Ambulatory Visit: Payer: Self-pay | Admitting: Obstetrics and Gynecology

## 2020-11-24 ENCOUNTER — Other Ambulatory Visit (HOSPITAL_COMMUNITY): Payer: Self-pay

## 2020-11-28 ENCOUNTER — Other Ambulatory Visit: Payer: Self-pay | Admitting: Obstetrics and Gynecology

## 2020-11-28 ENCOUNTER — Other Ambulatory Visit (HOSPITAL_COMMUNITY): Payer: Self-pay

## 2020-11-28 NOTE — Telephone Encounter (Signed)
Patient came to office today to make apt for pap but she is needing a refill for her Nanetta Batty she will be out by Sunday apt is not until August 24th with Dr Elonda Husky [facility]

## 2020-11-29 ENCOUNTER — Other Ambulatory Visit (HOSPITAL_COMMUNITY): Payer: Self-pay

## 2020-11-30 ENCOUNTER — Other Ambulatory Visit: Payer: Self-pay | Admitting: Obstetrics and Gynecology

## 2020-11-30 ENCOUNTER — Other Ambulatory Visit (HOSPITAL_COMMUNITY): Payer: Self-pay

## 2020-11-30 DIAGNOSIS — D696 Thrombocytopenia, unspecified: Secondary | ICD-10-CM

## 2020-11-30 DIAGNOSIS — C01 Malignant neoplasm of base of tongue: Secondary | ICD-10-CM

## 2020-11-30 NOTE — Progress Notes (Signed)
Pathology report received and reviewed by Dr. Delton Coombes. PET scan ordered per Dr. Delton Coombes. I have called the patient and made her aware, patient verbalized understanding. All questions addressed and answered.

## 2020-12-04 ENCOUNTER — Telehealth: Payer: Self-pay | Admitting: Obstetrics and Gynecology

## 2020-12-04 ENCOUNTER — Other Ambulatory Visit: Payer: Self-pay

## 2020-12-04 ENCOUNTER — Other Ambulatory Visit (HOSPITAL_COMMUNITY): Payer: Self-pay

## 2020-12-04 MED ORDER — CARBAMAZEPINE ER 400 MG PO TB12
400.0000 mg | ORAL_TABLET | Freq: Two times a day (BID) | ORAL | 3 refills | Status: DC
  Filled 2020-12-04: qty 180, 90d supply, fill #0
  Filled 2021-03-12: qty 180, 90d supply, fill #1
  Filled 2021-06-20: qty 180, 90d supply, fill #2

## 2020-12-04 MED ORDER — ESTRADIOL 1 MG PO TABS
1.0000 mg | ORAL_TABLET | Freq: Every day | ORAL | 0 refills | Status: DC
  Filled 2020-12-04: qty 30, 30d supply, fill #0

## 2020-12-04 NOTE — Telephone Encounter (Signed)
Patient came in to the office for the second time for refill on estrogen meds. She is completely out. Appointment is schedule for pap/phy on 8/24. Patient asking for enough to get her til her appt. To [facility]

## 2020-12-06 ENCOUNTER — Other Ambulatory Visit (HOSPITAL_COMMUNITY): Payer: Self-pay

## 2020-12-06 MED ORDER — ESTRADIOL 1 MG PO TABS
1.0000 mg | ORAL_TABLET | Freq: Every day | ORAL | 1 refills | Status: DC
  Filled 2020-12-06 – 2020-12-22 (×2): qty 30, 30d supply, fill #0

## 2020-12-07 ENCOUNTER — Encounter (HOSPITAL_COMMUNITY)
Admission: RE | Admit: 2020-12-07 | Discharge: 2020-12-07 | Disposition: A | Discharge status: Home / Self Care | Payer: 59 | Source: Ambulatory Visit | Attending: Hematology | Admitting: Hematology

## 2020-12-07 ENCOUNTER — Other Ambulatory Visit: Payer: Self-pay

## 2020-12-07 ENCOUNTER — Other Ambulatory Visit (HOSPITAL_COMMUNITY): Payer: Self-pay | Admitting: Hematology

## 2020-12-07 DIAGNOSIS — D696 Thrombocytopenia, unspecified: Secondary | ICD-10-CM | POA: Diagnosis not present

## 2020-12-07 DIAGNOSIS — C01 Malignant neoplasm of base of tongue: Secondary | ICD-10-CM | POA: Insufficient documentation

## 2020-12-07 DIAGNOSIS — Z1231 Encounter for screening mammogram for malignant neoplasm of breast: Secondary | ICD-10-CM

## 2020-12-07 DIAGNOSIS — C069 Malignant neoplasm of mouth, unspecified: Secondary | ICD-10-CM | POA: Diagnosis not present

## 2020-12-07 MED ORDER — FLUDEOXYGLUCOSE F - 18 (FDG) INJECTION
6.5100 | Freq: Once | INTRAVENOUS | Status: AC | PRN
  Administered 2020-12-07: 6.51 via INTRAVENOUS

## 2020-12-09 ENCOUNTER — Ambulatory Visit
Admission: EM | Admit: 2020-12-09 | Discharge: 2020-12-09 | Disposition: A | Discharge status: Home / Self Care | Payer: 59 | Attending: Physician Assistant | Admitting: Physician Assistant

## 2020-12-09 ENCOUNTER — Encounter: Payer: Self-pay | Admitting: Emergency Medicine

## 2020-12-09 ENCOUNTER — Other Ambulatory Visit: Payer: Self-pay

## 2020-12-09 DIAGNOSIS — L259 Unspecified contact dermatitis, unspecified cause: Secondary | ICD-10-CM

## 2020-12-09 MED ORDER — HYDROCORTISONE 1 % EX CREA: TOPICAL_CREAM | CUTANEOUS | 0 refills | Status: DC

## 2020-12-09 NOTE — ED Triage Notes (Signed)
Had PET scan done on Thursday.  Developed a res area above injection site yesterday.  Area has spread up arm more today.

## 2020-12-09 NOTE — Discharge Instructions (Addendum)
Apply cream as needed Take Benadryl as needed Keep area clean and dry

## 2020-12-09 NOTE — ED Provider Notes (Signed)
[facility]    CSN: [id number] Arrival date & time: 12/09/20  1020      History   Chief Complaint No chief complaint on file.   HPI Latoya Bautista is a 50 y.o. female.   Pt complains of a burning rash to left arm that started earlier today.  She had a PET scan on Thursday and thinks this rash may be associated with her IV site.  She reports some itching.  She has applied Cereve cream with temporary relief.    Past Medical History:  Diagnosis Date   Anemia    Cancer ([place])    tongue cancer   H/O echocardiogram    a. 07/2017: echo showing EF of 55-60%, Grade 1 DD, and no significant valve abnormalities.    Hepatitis C    Hepatitis C 04/11/2011   Leukemia ([place])    Liver cirrhosis ([place]) 04/11/2011   Osteoporosis    Seizures ([place])    Shingles    Thrombocytopenia ([place]) 04/11/2011   Thyroid disease     Patient Active Problem List   Diagnosis Date Noted   Melanoma in situ of skin of buttock ([place]) 08/27/2018   Vulvar lesion 06/09/2018   History of thrombocytopenia 12/11/2015   Well woman exam with routine gynecological exam 10/18/2013   Localization-related epilepsy ([place]) 01/01/2013   Partial epilepsy with impairment of consciousness ([place]) 12/15/2012   Postmenopausal bleeding DUE TO hormone tx. 10/08/2012   Hepatitis C 04/11/2011   Liver cirrhosis ([place]) 04/11/2011   Thrombocytopenia ([place]) 04/11/2011    Past Surgical History:  Procedure Laterality Date   BONE MARROW BIOPSY  05/07/2011   BONE MARROW TRANSPLANT     BUNIONECTOMY     CHOLECYSTECTOMY     liver biopsie     TONGUE BIOPSY     tongue cancer     surgical removal of area    OB History   No obstetric history on file.      Home Medications    Prior to Admission medications   Medication Sig Start Date End Date Taking? Authorizing Provider  hydrocortisone cream 1 % Apply to affected area 2 times daily 12/09/20  Yes Casaundra Takacs, Janett Billow, PA-C  carbamazepine (TEGRETOL XR) 400 MG 12 hr tablet Take 1  tablet (400 mg total) by mouth 2 (two) times daily. 12/04/20     cholecalciferol (VITAMIN D) 1000 units tablet Take 1,000 Units by mouth daily.    [provider]  denosumab (PROLIA) 60 MG/ML SOSY injection Recieve injection of $RemoveBefo'60mg'[id number]$  every 6 months subcutaneously. To be administered by MD Patient taking differently: Inject 60 mg into the skin every 6 (six) months. Will start later in the year -ab 09/29/20     dexlansoprazole (DEXILANT) 60 MG capsule TAKE 1 CAPSULE BY MOUTH ONCE DAILY 02/26/20 02/25/21  [name], MD  diclofenac Sodium (VOLTAREN) 1 % GEL Apply 2 g topically 4 (four) times daily. 06/15/19   Evalee Jefferson, PA-C  estradiol (ESTRACE) 1 MG tablet Take 1 tablet (1 mg total) by mouth daily. 4/0/98     folic acid (FOLVITE) 1 MG tablet Take 1 tablet (1 mg total) by mouth daily. 11/20/20   Derek Jack, MD  Garlic 1191 MG CAPS Take 1 capsule by mouth daily.    [provider]  icosapent Ethyl (VASCEPA) 1 g capsule TAKE 2 CAPSULES BY MOUTH TWICE A DAY 09/25/20     levocetirizine (XYZAL) 5 MG tablet Take by mouth.    [provider]  levothyroxine (  SYNTHROID, LEVOTHROID) 75 MCG tablet Take 75 mcg by mouth daily before breakfast.     [provider]  metoprolol tartrate (LOPRESSOR) 25 MG tablet Take 1.5 tablets (37.5 mg total) by mouth 2 (two) times daily. 11/15/20   Arnoldo Lenis, MD  MODERNA COVID-19 VACCINE 100 MCG/0.5ML injection  01/21/20   [provider]  Multiple Vitamins-Minerals (CENTRUM PO) Take 1 tablet by mouth daily.      [provider]  olmesartan (BENICAR) 20 MG tablet Take 1 tablet (20 mg total) by mouth daily. 10/23/20     ondansetron (ZOFRAN) 4 MG tablet Take 1 tablet (4 mg total) by mouth every 8 (eight) hours as needed for nausea or vomiting. 08/14/20   Regal, Tamala Fothergill, DPM  oxyCODONE-acetaminophen (PERCOCET) 10-325 MG tablet Take 1 tablet by mouth every 4 (four) hours as needed for pain. Patient not taking: Reported  on 11/15/2020 08/14/20   Wallene Huh, DPM  progesterone (PROMETRIUM) 100 MG capsule Take 1 capsule (100 mg total) by mouth daily for 15 days per month. 08/14/20   Florian Buff, MD  rosuvastatin (CRESTOR) 40 MG tablet Take 1 tablet (40 mg total) by mouth at bedtime. 09/29/20     sodium bicarbonate 325 MG tablet Take by mouth.    [provider]  SYNTHROID 75 MCG tablet Take 1 tablet (75 mcg total) by mouth daily. 09/29/20     vitamin B-12 (CYANOCOBALAMIN) 1000 MCG tablet Take 1,000 mcg by mouth daily.    [provider]  vitamin C (ASCORBIC ACID) 500 MG tablet Take 500 mg by mouth daily.  07/18/10   [provider]  Vitamin D, Ergocalciferol, (DRISDOL) 1.25 MG (50000 UNIT) CAPS capsule Take 1 capsule (50,000 Units total) by mouth every 14 (fourteen) days. 09/29/20     vitamin E 400 UNIT capsule Take 400 Units by mouth 2 (two) times daily.    [provider]    Family History Family History  Problem Relation Age of Onset   Hypertension Father    Diabetes Maternal Uncle     Social History Social History   Tobacco Use   Smoking status: Never   Smokeless tobacco: Never  Vaping Use   Vaping Use: Never used  Substance Use Topics   Alcohol use: No   Drug use: No     Allergies   Simvastatin, Briviact [brivaracetam], Keppra [levetiracetam], Asparaginase derivatives, Elspar [asparaginase], and Soap   Review of Systems Review of Systems  Constitutional:  Negative for chills and fever.  HENT:  Negative for ear pain and sore throat.   Eyes:  Negative for pain and visual disturbance.  Respiratory:  Negative for cough and shortness of breath.   Cardiovascular:  Negative for chest pain and palpitations.  Gastrointestinal:  Negative for abdominal pain and vomiting.  Genitourinary:  Negative for dysuria and hematuria.  Musculoskeletal:  Negative for arthralgias and back pain.  Skin:  Positive for rash. Negative for color change.  Neurological:  Negative  for seizures and syncope.  All other systems reviewed and are negative.   Physical Exam Triage Vital Signs ED Triage Vitals  Enc Vitals Group     BP 12/09/20 1121 (!) 149/89     Pulse Rate 12/09/20 1121 100     Resp 12/09/20 1121 16     Temp 12/09/20 1121 98.2 F (36.8 C)     Temp Source 12/09/20 1121 Oral     SpO2 12/09/20 1121 98 %     Weight --  Height --      Head Circumference --      Peak Flow --      Pain Score 12/09/20 1122 4     Pain Loc --      Pain Edu? --      Excl. in [place]? --    No data found.  Updated Vital Signs BP (!) 149/89 (BP Location: Right Arm)   Pulse 100   Temp 98.2 F (36.8 C) (Oral)   Resp 16   SpO2 98%   Visual Acuity Right Eye Distance:   Left Eye Distance:   Bilateral Distance:    Right Eye Near:   Left Eye Near:    Bilateral Near:     Physical Exam Vitals and nursing note reviewed.  Constitutional:      General: She is not in acute distress.    Appearance: She is well-developed.  HENT:     Head: Normocephalic and atraumatic.  Eyes:     Conjunctiva/sclera: Conjunctivae normal.  Cardiovascular:     Rate and Rhythm: Normal rate and regular rhythm.     Heart sounds: No murmur heard. Pulmonary:     Effort: Pulmonary effort is normal. No respiratory distress.     Breath sounds: Normal breath sounds.  Abdominal:     Palpations: Abdomen is soft.     Tenderness: There is no abdominal tenderness.  Musculoskeletal:     Cervical back: Neck supple.  Skin:    General: Skin is warm and dry.     Comments: Raised flat red rash noted to medial aspect of left forearm.   Neurological:     Mental Status: She is alert.     UC Treatments / Results  Labs (all labs ordered are listed, but only abnormal results are displayed) Labs Reviewed - No data to display  EKG   Radiology No results found.  Procedures Procedures (including critical care time)  Medications Ordered in UC Medications - No data to display  Initial  Impression / Assessment and Plan / UC Course  I have reviewed the triage vital signs and the nursing notes.  Pertinent labs & imaging results that were available during my care of the patient were reviewed by me and considered in my medical decision making (see chart for details).     Contact dermatitis.  Steroid cream prescribed.  Advised to take benadryl as needed.  Return precautions discussed.  Final Clinical Impressions(s) / UC Diagnoses   Final diagnoses:  Contact dermatitis, unspecified contact dermatitis type, unspecified trigger     Discharge Instructions      Apply cream as needed Take Benadryl as needed Keep area clean and dry   ED Prescriptions     Medication Sig Dispense Auth. Provider   hydrocortisone cream 1 % Apply to affected area 2 times daily 15 g Konrad Felix, PA-C      PDMP not reviewed this encounter.   Konrad Felix, PA-C 12/09/20 1209

## 2020-12-11 NOTE — Progress Notes (Addendum)
[facility] [address], [place] [address]   CLINIC:  Medical Oncology/Hematology  CONSULT NOTE  Patient Care Team: Benita Stabile, MD as PCP - General (Internal Medicine) Wyline Mood, Dorothe Pea, MD as PCP - Cardiology (Cardiology) Therese Sarah, RN as Oncology Nurse Navigator (Oncology) Doreatha Massed, MD as Medical Oncologist (Oncology)  CHIEF COMPLAINTS/PURPOSE OF CONSULTATION:  Evaluation of invasive squamous cell carcinoma of tongue  HISTORY OF PRESENTING ILLNESS:  Latoya Bautista 50 y.o. female is here because of evaluation of invasive squamous cell carcinoma of tongue, at the request of [facility] .  Today she reports feeling good, and she is accompanied by her husband. She has had white patches on her tongue regularly biopsied since 2005. All biopsies previous to July 2021 were non-cancerous. She was diagnosed with ALL at 50 year old and was treated with chemotherapy, radiation therapy, and two BM transplants at ages 36 and 28 at [facility]. The donor for both of her BM transplants was her brother. March 2020 a vulva biopsy showed melanoma in situ. She rarely had menses and when they occurred they were very light; she went through menopause in her twenties. She has a history of seizures that are currently being well controlled with carbamazepine, and she denies having any recent seizures. She denies tingling and numbness, weight loss, trouble chewing or swallowing, and she reports good appetite.   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 She denies any family history of cancer.  MEDICAL HISTORY:  Past Medical History:  Diagnosis Date   Anemia    Cancer ([facility])    tongue cancer   H/O echocardiogram    a. 07/2017: echo showing EF of 55-60%, Grade 1 DD, and no significant valve abnormalities.    Hepatitis C    Hepatitis C 04/11/2011   Leukemia ([facility])    Liver cirrhosis ([facility]) 04/11/2011   Osteoporosis    Seizures  ([facility])    Shingles    Thrombocytopenia ([facility]) 04/11/2011   Thyroid disease     SURGICAL HISTORY: Past Surgical History:  Procedure Laterality Date   BONE MARROW BIOPSY  05/07/2011   BONE MARROW TRANSPLANT     BUNIONECTOMY     CHOLECYSTECTOMY     liver biopsie     TONGUE BIOPSY     tongue cancer     surgical removal of area    SOCIAL HISTORY: Social History   Socioeconomic History   Marital status: Married    Spouse name: [name]   Number of children: 0   Years of education: 12th   Highest education level: Not on file  Occupational History    Employer: [employer]  Tobacco Use   Smoking status: Never   Smokeless tobacco: Never  Vaping Use   Vaping Use: Never used  Substance and Sexual Activity   Alcohol use: No   Drug use: No   Sexual activity: Not Currently    Birth control/protection: None, Post-menopausal  Other Topics Concern   Not on file  Social History Narrative   Patient lives at home with her spouse.   Caffeine Use: 16oz bottle of soda daily   Social Determinants of Health   Financial Resource Strain: Low Risk    Difficulty of Paying Living Expenses: Not very hard  Food Insecurity: No Food Insecurity   Worried About Running Out of Food in the Last Year: Never true   Ran Out of Food in the Last  Year: Never true  Transportation Needs: No Transportation Needs   Lack of Transportation (Medical): No   Lack of Transportation (Non-Medical): No  Physical Activity: Sufficiently Active   Days of Exercise per Week: 6 days   Minutes of Exercise per Session: 50 min  Stress: No Stress Concern Present   Feeling of Stress : Not at all  Social Connections: Moderately Integrated   Frequency of Communication with Friends and Family: More than three times a week   Frequency of Social Gatherings with Friends and Family: More than three times a week   Attends Religious Services: More than 4 times per year   Active Member of [employer] or Organizations: Not on  file   Attends Archivist Meetings: Never   Marital Status: Married  Human resources officer Violence: Not At Risk   Fear of Current or Ex-Partner: No   Emotionally Abused: No   Physically Abused: No   Sexually Abused: No    FAMILY HISTORY: Family History  Problem Relation Age of Onset   Hypertension Father    Diabetes Maternal Uncle     ALLERGIES:  is allergic to simvastatin, briviact [brivaracetam], keppra [levetiracetam], asparaginase derivatives, elspar [asparaginase], and soap.  MEDICATIONS:  Current Outpatient Medications  Medication Sig Dispense Refill   carbamazepine (TEGRETOL XR) 400 MG 12 hr tablet Take 1 tablet (400 mg total) by mouth 2 (two) times daily. 180 tablet 3   cholecalciferol (VITAMIN D) 1000 units tablet Take 1,000 Units by mouth daily.     denosumab (PROLIA) 60 MG/ML SOSY injection Recieve injection of $RemoveBefo'60mg'[id number]$  every 6 months subcutaneously. To be administered by MD (Patient taking differently: Inject 60 mg into the skin every 6 (six) months. Will start later in the year -ab) 1 mL 0   dexlansoprazole (DEXILANT) 60 MG capsule TAKE 1 CAPSULE BY MOUTH ONCE DAILY 90 capsule 3   diclofenac Sodium (VOLTAREN) 1 % GEL Apply 2 g topically 4 (four) times daily. 100 g 0   estradiol (ESTRACE) 1 MG tablet Take 1 tablet (1 mg total) by mouth daily. 30 tablet 1   folic acid (FOLVITE) 1 MG tablet Take 1 tablet (1 mg total) by mouth daily. 30 tablet 4   Garlic 1638 MG CAPS Take 1 capsule by mouth daily.     hydrocortisone cream 1 % Apply to affected area 2 times daily 15 g 0   icosapent Ethyl (VASCEPA) 1 g capsule TAKE 2 CAPSULES BY MOUTH TWICE A DAY 360 capsule 0   levocetirizine (XYZAL) 5 MG tablet Take by mouth.     levothyroxine (SYNTHROID, LEVOTHROID) 75 MCG tablet Take 75 mcg by mouth daily before breakfast.      metoprolol tartrate (LOPRESSOR) 25 MG tablet Take 1.5 tablets (37.5 mg total) by mouth 2 (two) times daily. 270 tablet 3   MODERNA COVID-19 VACCINE 100  MCG/0.5ML injection      Multiple Vitamins-Minerals (CENTRUM PO) Take 1 tablet by mouth daily.       olmesartan (BENICAR) 20 MG tablet Take 1 tablet (20 mg total) by mouth daily. 90 tablet 0   oxyCODONE-acetaminophen (PERCOCET) 10-325 MG tablet Take 1 tablet by mouth every 4 (four) hours as needed for pain. 25 tablet 0   progesterone (PROMETRIUM) 100 MG capsule Take 1 capsule (100 mg total) by mouth daily for 15 days per month. 15 capsule 5   rosuvastatin (CRESTOR) 40 MG tablet Take 1 tablet (40 mg total) by mouth at bedtime. 90 tablet 1   sodium bicarbonate 325 MG  tablet Take by mouth.     SYNTHROID 75 MCG tablet Take 1 tablet (75 mcg total) by mouth daily. 90 tablet 1   vitamin B-12 (CYANOCOBALAMIN) 1000 MCG tablet Take 1,000 mcg by mouth daily.     vitamin C (ASCORBIC ACID) 500 MG tablet Take 500 mg by mouth daily.      Vitamin D, Ergocalciferol, (DRISDOL) 1.25 MG (50000 UNIT) CAPS capsule Take 1 capsule (50,000 Units total) by mouth every 14 (fourteen) days. 20 capsule 1   vitamin E 400 UNIT capsule Take 400 Units by mouth 2 (two) times daily.     ondansetron (ZOFRAN) 4 MG tablet Take 1 tablet (4 mg total) by mouth every 8 (eight) hours as needed for nausea or vomiting. (Patient not taking: Reported on 12/12/2020) 20 tablet 0   No current facility-administered medications for this visit.    REVIEW OF SYSTEMS:   Review of Systems  Constitutional:  Negative for appetite change, fatigue (80%) and unexpected weight change.  HENT:   Negative for trouble swallowing.   Cardiovascular:  Positive for chest pain (increased HR).  Neurological:  Negative for numbness and seizures.  All other systems reviewed and are negative.   PHYSICAL EXAMINATION: ECOG PERFORMANCE STATUS: 0 - Asymptomatic  Vitals:   12/12/20 0759  BP: 133/67  Pulse: 91  Resp: 17  Temp: (!) 96.7 F (35.9 C)  SpO2: 95%   Filed Weights   12/12/20 0759  Weight: 123 lb 6.4 oz (56 kg)   Physical Exam Vitals reviewed.   Constitutional:      Appearance: Normal appearance.  HENT:     Mouth/Throat:     Comments: R lateral side resection area well healed Cardiovascular:     Rate and Rhythm: Normal rate and regular rhythm.     Pulses: Normal pulses.     Heart sounds: Normal heart sounds.  Pulmonary:     Effort: Pulmonary effort is normal.     Breath sounds: Normal breath sounds.  Abdominal:     Palpations: Abdomen is soft. There is no hepatomegaly, splenomegaly or mass.     Tenderness: no abdominal tenderness  Musculoskeletal:     Right lower leg: No edema.     Left lower leg: No edema.  Neurological:     General: No focal deficit present.     Mental Status: She is alert and oriented to person, place, and time.  Psychiatric:        Mood and Affect: Mood normal.        Behavior: Behavior normal.     LABORATORY DATA:  I have reviewed the data as listed CBC Latest Ref Rng & Units 03/14/2020 09/16/2019 03/16/2019  WBC 4.0 - 10.5 K/uL 6.8 9.4 8.5  Hemoglobin 12.0 - 15.0 g/dL 13.8 13.8 13.4  Hematocrit 36.0 - 46.0 % 40.7 39.9 39.2  Platelets 150 - 400 K/uL 169 192 169   CMP Latest Ref Rng & Units 03/14/2020 09/16/2019 03/16/2019  Glucose 70 - 99 mg/dL 120(H) 109(H) 107(H)  BUN 6 - 20 mg/dL 24(H) 26(H) 23(H)  Creatinine 0.44 - 1.00 mg/dL 0.71 0.62 0.64  Sodium 135 - 145 mmol/L 133(L) 126(L) 127(L)  Potassium 3.5 - 5.1 mmol/L 4.3 4.8 4.0  Chloride 98 - 111 mmol/L 99 93(L) 94(L)  CO2 22 - 32 mmol/L 22 24 21(L)  Calcium 8.9 - 10.3 mg/dL 9.5 9.9 9.7  Total Protein 6.5 - 8.1 g/dL 7.6 7.8 7.6  Total Bilirubin 0.3 - 1.2 mg/dL 0.5 0.5 0.6  Alkaline  Phos 38 - 126 U/L 62 62 55  AST 15 - 41 U/L 38 38 39  ALT 0 - 44 U/L 39 54(H) 40    RADIOGRAPHIC STUDIES: I have personally reviewed the radiological images as listed and agreed with the findings in the report. NM PET Image Initial (PI) Skull Base To Thigh  Result Date: 12/09/2020 CLINICAL DATA:  Subsequent treatment strategy for tongue cancer. History of  tongue cancer in 1982 history of chemotherapy and radiation therapy. Recent tongue biopsy 6 weeks ago. EXAM: NUCLEAR MEDICINE PET SKULL BASE TO THIGH TECHNIQUE: 6.51 mCi F-18 FDG was injected intravenously. Full-ring PET imaging was performed from the skull base to thigh after the radiotracer. CT data was obtained and used for attenuation correction and anatomic localization. Fasting blood glucose: 105 mg/dl COMPARISON:  None. FINDINGS: Mediastinal blood pool activity: SUV max 2.39 Liver activity: SUV max NA NECK: No hypermetabolic tongue mass is identified. There is a E symmetric uniform rim of hypermetabolism paralleling the mandible which is probably misregistration related to the mandible. The discrete hypermetabolic tongue base mass is not identified. No enlarged or hypermetabolic neck nodes. Incidental CT findings: none CHEST: No hypermetabolic mediastinal or hilar nodes. No suspicious pulmonary nodules on the CT scan. No breast masses, supraclavicular or axillary adenopathy. Incidental CT findings: Emphysematous changes and possible interstitial lung disease. Correlation with high-resolution chest CT may be helpful for further evaluation. No pleural effusions. Age advanced coronary artery calcifications. ABDOMEN/PELVIS: No abnormal hypermetabolic activity within the liver, pancreas, adrenal glands, or spleen. No hypermetabolic lymph nodes in the abdomen or pelvis. Incidental CT findings: Status post cholecystectomy. Scattered vascular calcifications. SKELETON: No focal hypermetabolic activity to suggest skeletal metastasis. Incidental CT findings: none IMPRESSION: 1. No obvious discrete hypermetabolic tongue mass. Uniform/symmetric peripheral rim of hypermetabolism likely misregistration artifact related to the mandible. 2. No enlarged or hypermetabolic neck adenopathy. 3. No findings for metastatic disease involving the chest, abdomen or pelvis. Electronically Signed   By: Marijo Sanes M.D.   On: 12/09/2020  13:25    ASSESSMENT:  1.  Recurrent superficial invasive squamous cell carcinoma of the right lateral tongue: - Patient reports that she has been having tongue biopsy for multiple areas of dysplasia on her right tongue in 2005, 2007, 2012.  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 - Biopsy on 12/02/2019 one of the right tongue showed superficial invasive squamous cell carcinoma, p16 negative, margins negative. - She was recently seen by Dr. Luvenia Heller and underwent right lateral tongue biopsy on 11/16/2020.  The new lesion was more anterior on the right side of the tongue to the previous lesion resected in July 2021.  It was about 5 mm in diameter, raised, red/white and firm. - Pathology was consistent with superficial invasive squamous cell carcinoma, with positive lateral margins and negative deep margin.  P16 is pending.  2.  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  She lives at home with her husband.  No children. - She was diagnosed with ALL around the age of 2 years. - She underwent whole-body radiation with stem cell transplantation at ages 1 and 15 at [facility]. - She also had left leg skin lesion resected in June 2018, superficial nodular basal cell cancer. - She had melanoma in situ on vulvar biopsy on 07/16/2018. - She was never smoker. - No family history of malignancies. - Patient is followed at [facility] hepatology (treated for hep C acquired during transplant)  and seizure clinics.    PLAN:  1.  Recurrent superficial invasive squamous cell carcinoma of the right lateral tongue: - We have discussed pathology report with the patient and her husband in detail. - We reviewed PET CT scan from 12/07/2020 which did not show any hypermetabolic tongue mass.  There is asymmetric uniform rim of hypermetabolic along the mandible which is probably misregistration.  No enlarged hypermetabolic lymphadenopathy.  No  other evidence of metastatic disease. -I have recommended referral to head and neck surgeon for wide excision. - I will reach out to Dr. Luvenia Heller and to coordinate the referral process at [place] 3 months for follow-up.   All questions were answered. The patient knows to call the clinic with any problems, questions or concerns.   Derek Jack, MD, 12/12/20 4:57 PM  [place] [phone]   I, Thana Ates, am acting as a scribe for Dr. Derek Jack.  I, Derek Jack MD, have reviewed the above documentation for accuracy and completeness, and I agree with the above.

## 2020-12-12 ENCOUNTER — Encounter (HOSPITAL_COMMUNITY): Payer: Self-pay | Admitting: Hematology

## 2020-12-12 ENCOUNTER — Inpatient Hospital Stay (HOSPITAL_COMMUNITY): Payer: 59 | Attending: Hematology | Admitting: Hematology

## 2020-12-12 ENCOUNTER — Other Ambulatory Visit: Payer: Self-pay

## 2020-12-12 DIAGNOSIS — Z856 Personal history of leukemia: Secondary | ICD-10-CM

## 2020-12-12 DIAGNOSIS — C029 Malignant neoplasm of tongue, unspecified: Secondary | ICD-10-CM | POA: Diagnosis not present

## 2020-12-12 DIAGNOSIS — C021 Malignant neoplasm of border of tongue: Secondary | ICD-10-CM

## 2020-12-12 DIAGNOSIS — Z86006 Personal history of melanoma in-situ: Secondary | ICD-10-CM

## 2020-12-12 NOTE — Patient Instructions (Addendum)
[place] at [facility] Discharge Instructions  You were seen and examined today by Dr. Delton Coombes. Dr. Delton Coombes is a medical oncologist, meaning he specializes in the management of cancer diagnoses with medications. Dr. Delton Coombes discussed your past medical history, family history of cancer and the events that led to you being here today.  Dr. Delton Coombes reviewed your recent pathology report and PET scan. 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 Your recent PET scan reveals no obvious tongue mass. There was also no concern for cancer anywhere else in your body, this is good news!   Dr. Delton Coombes has recommended a referral to a head and neck surgeon to completely remove the cancerous margins. We can refer you to [facility] for this. Following complete removal of the cancer, you can continue close monitoring with Dr. Luvenia Heller.   Thank you for choosing [place] at [facility] to provide your oncology and hematology care.  To afford each patient quality time with our provider, please arrive at least 15 minutes before your scheduled appointment time.   If you have a lab appointment with the [place] please come in thru the Main Entrance and check in at the main information desk.  You need to re-schedule your appointment should you arrive 10 or more minutes late.  We strive to give you quality time with our providers, and arriving late affects you and other patients whose appointments are after yours.  Also, if you no show three or more times for appointments you may be dismissed from the clinic at the providers discretion.     Again, thank you for choosing [facility].  Our hope is that these requests will decrease the amount of time that you wait before being seen by our physicians.        _____________________________________________________________  Should you have questions after your visit to [facility], please contact our office at [phone] and follow the prompts.  Our office hours are 8:00 a.m. and 4:30 p.m. Monday - Friday.  Please note that voicemails left after 4:00 p.m. may not be returned until the following business day.  We are closed weekends and major holidays.  You do have access to a nurse 24-7, just call the main number to the clinic [phone] and do not press any options, hold on the line and a nurse will answer the phone.    For prescription refill requests, have your pharmacy contact our office and allow 72 hours.    Due to Covid, you will need to wear a mask upon entering the hospital. If you do not have a mask, a mask will be given to you at the Main Entrance upon arrival. For doctor visits, patients may have 1 support person age 50 or older with them. For treatment visits, patients can not have anyone with them due to social distancing guidelines and our immunocompromised population.

## 2020-12-14 ENCOUNTER — Ambulatory Visit (HOSPITAL_COMMUNITY)
Admission: RE | Admit: 2020-12-14 | Discharge: 2020-12-14 | Disposition: A | Discharge status: Home / Self Care | Payer: 59 | Source: Ambulatory Visit | Attending: Hematology | Admitting: Hematology

## 2020-12-14 ENCOUNTER — Other Ambulatory Visit: Payer: Self-pay

## 2020-12-14 DIAGNOSIS — Z1231 Encounter for screening mammogram for malignant neoplasm of breast: Secondary | ICD-10-CM | POA: Insufficient documentation

## 2020-12-22 ENCOUNTER — Other Ambulatory Visit (HOSPITAL_COMMUNITY): Payer: Self-pay

## 2020-12-22 DIAGNOSIS — Z0000 Encounter for general adult medical examination without abnormal findings: Secondary | ICD-10-CM | POA: Diagnosis not present

## 2020-12-22 MED ORDER — ICOSAPENT ETHYL 1 G PO CAPS
2.0000 g | ORAL_CAPSULE | Freq: Two times a day (BID) | ORAL | 0 refills | Status: DC
  Filled 2020-12-22: qty 360, 90d supply, fill #0

## 2020-12-26 ENCOUNTER — Encounter (HOSPITAL_COMMUNITY): Payer: Self-pay

## 2020-12-26 NOTE — Progress Notes (Signed)
Dr. Delton Coombes awaiting call from Dr. Luvenia Heller. I spoke with Nevin Bloodgood at Dr. Sueanne Margarita office who has taken Dr. Tomie China contact information and will have Dr. Luvenia Heller call.

## 2020-12-27 ENCOUNTER — Other Ambulatory Visit (HOSPITAL_COMMUNITY): Payer: Self-pay

## 2020-12-27 ENCOUNTER — Other Ambulatory Visit (HOSPITAL_COMMUNITY)
Admission: RE | Admit: 2020-12-27 | Discharge: 2020-12-27 | Disposition: A | Discharge status: Home / Self Care | Payer: 59 | Source: Ambulatory Visit | Attending: Advanced Practice Midwife | Admitting: Advanced Practice Midwife

## 2020-12-27 ENCOUNTER — Other Ambulatory Visit: Payer: 59 | Admitting: Advanced Practice Midwife

## 2020-12-27 ENCOUNTER — Encounter (HOSPITAL_COMMUNITY): Payer: Self-pay

## 2020-12-27 ENCOUNTER — Ambulatory Visit (INDEPENDENT_AMBULATORY_CARE_PROVIDER_SITE_OTHER): Payer: 59 | Admitting: Advanced Practice Midwife

## 2020-12-27 ENCOUNTER — Encounter (HOSPITAL_COMMUNITY): Payer: Self-pay | Admitting: Lab

## 2020-12-27 ENCOUNTER — Encounter: Payer: Self-pay | Admitting: Advanced Practice Midwife

## 2020-12-27 ENCOUNTER — Other Ambulatory Visit: Payer: Self-pay

## 2020-12-27 VITALS — BP 135/84 | HR 83 | Ht 59.0 in | Wt 123.0 lb

## 2020-12-27 DIAGNOSIS — E039 Hypothyroidism, unspecified: Secondary | ICD-10-CM | POA: Diagnosis not present

## 2020-12-27 DIAGNOSIS — E871 Hypo-osmolality and hyponatremia: Secondary | ICD-10-CM | POA: Diagnosis not present

## 2020-12-27 DIAGNOSIS — I959 Hypotension, unspecified: Secondary | ICD-10-CM | POA: Diagnosis not present

## 2020-12-27 DIAGNOSIS — B182 Chronic viral hepatitis C: Secondary | ICD-10-CM | POA: Diagnosis not present

## 2020-12-27 DIAGNOSIS — Z01419 Encounter for gynecological examination (general) (routine) without abnormal findings: Secondary | ICD-10-CM

## 2020-12-27 DIAGNOSIS — E782 Mixed hyperlipidemia: Secondary | ICD-10-CM | POA: Diagnosis not present

## 2020-12-27 DIAGNOSIS — R000 Tachycardia, unspecified: Secondary | ICD-10-CM | POA: Diagnosis not present

## 2020-12-27 DIAGNOSIS — K7460 Unspecified cirrhosis of liver: Secondary | ICD-10-CM | POA: Diagnosis not present

## 2020-12-27 DIAGNOSIS — M810 Age-related osteoporosis without current pathological fracture: Secondary | ICD-10-CM | POA: Diagnosis not present

## 2020-12-27 DIAGNOSIS — R7301 Impaired fasting glucose: Secondary | ICD-10-CM | POA: Diagnosis not present

## 2020-12-27 MED ORDER — ESTRADIOL 1 MG PO TABS
1.0000 mg | ORAL_TABLET | Freq: Every day | ORAL | 4 refills | Status: DC
  Filled 2020-12-27: qty 90, 90d supply, fill #0
  Filled 2021-03-12: qty 90, 90d supply, fill #1
  Filled 2021-06-18: qty 90, 90d supply, fill #2
  Filled 2021-09-25: qty 90, 90d supply, fill #3

## 2020-12-27 MED ORDER — PROGESTERONE MICRONIZED 100 MG PO CAPS
100.0000 mg | ORAL_CAPSULE | Freq: Every day | ORAL | 5 refills | Status: DC
  Filled 2020-12-27: qty 45, 90d supply, fill #0
  Filled 2021-03-12 – 2021-03-19 (×2): qty 45, 90d supply, fill #1
  Filled 2021-07-09: qty 45, 90d supply, fill #2
  Filled 2021-09-20 – 2021-10-12 (×2): qty 45, 90d supply, fill #3

## 2020-12-27 NOTE — Progress Notes (Unsigned)
Referral sent to [facility] head and neck clinic  on 8/24 to [phone].  Called office at [phone] and they will review records and call pt directly for appt.

## 2020-12-27 NOTE — Progress Notes (Addendum)
Latoya Bautista 50 y.o.  Vitals:   12/27/20 1058  BP: 135/84  Pulse: 83     Filed Weights   12/27/20 1058  Weight: 123 lb (55.8 kg)    Past Medical History: Past Medical History:  Diagnosis Date   Anemia    Cancer ([place])    tongue cancer   H/O echocardiogram    a. 07/2017: echo showing EF of 55-60%, Grade 1 DD, and no significant valve abnormalities.    Hepatitis C    Hepatitis C 04/11/2011   Leukemia ([place])    Liver cirrhosis ([place]) 04/11/2011   Osteoporosis    Seizures ([place])    Shingles    Thrombocytopenia ([place]) 04/11/2011   Thyroid disease     Past Surgical History: Past Surgical History:  Procedure Laterality Date   BONE MARROW BIOPSY  05/07/2011   BONE MARROW TRANSPLANT     BUNIONECTOMY     CHOLECYSTECTOMY     liver biopsie     TONGUE BIOPSY     tongue cancer     surgical removal of area    Family History: Family History  Problem Relation Age of Onset   Hypertension Father    Diabetes Maternal Uncle     Social History: Social History   Tobacco Use   Smoking status: Never   Smokeless tobacco: Never  Vaping Use   Vaping Use: Never used  Substance Use Topics   Alcohol use: No   Drug use: No    Allergies:  Allergies  Allergen Reactions   Simvastatin Rash   Briviact [Brivaracetam]     Elevated LFT's   Keppra [Levetiracetam]     Rash   Asparaginase Derivatives Rash   Elspar [Asparaginase] Rash   Soap Rash    Procedure prep soap      Current Outpatient Medications:    carbamazepine (TEGRETOL XR) 400 MG 12 hr tablet, Take 1 tablet (400 mg total) by mouth 2 (two) times daily., Disp: 180 tablet, Rfl: 3   cholecalciferol (VITAMIN D) 1000 units tablet, Take 1,000 Units by mouth daily., Disp: , Rfl:    dexlansoprazole (DEXILANT) 60 MG capsule, TAKE 1 CAPSULE BY MOUTH ONCE DAILY, Disp: 90 capsule, Rfl: 3   folic acid (FOLVITE) 1 MG tablet, Take 1 tablet (1 mg total) by mouth daily., Disp: 30 tablet, Rfl: 4   Garlic 5465 MG CAPS, Take 1 capsule by  mouth daily., Disp: , Rfl:    icosapent Ethyl (VASCEPA) 1 g capsule, TAKE 2 CAPSULES BY MOUTH TWICE A DAY, Disp: 360 capsule, Rfl: 0   levocetirizine (XYZAL) 5 MG tablet, Take by mouth., Disp: , Rfl:    levothyroxine (SYNTHROID, LEVOTHROID) 75 MCG tablet, Take 75 mcg by mouth daily before breakfast. , Disp: , Rfl:    metoprolol tartrate (LOPRESSOR) 25 MG tablet, Take 1.5 tablets (37.5 mg total) by mouth 2 (two) times daily., Disp: 270 tablet, Rfl: 3   Multiple Vitamins-Minerals (CENTRUM PO), Take 1 tablet by mouth daily.  , Disp: , Rfl:    olmesartan (BENICAR) 20 MG tablet, Take 1 tablet (20 mg total) by mouth daily., Disp: 90 tablet, Rfl: 0   rosuvastatin (CRESTOR) 40 MG tablet, Take 1 tablet (40 mg total) by mouth at bedtime., Disp: 90 tablet, Rfl: 1   sodium bicarbonate 325 MG tablet, Take by mouth., Disp: , Rfl:    SYNTHROID 75 MCG tablet, Take 1 tablet (75 mcg total) by mouth daily., Disp: 90 tablet, Rfl: 1   vitamin B-12 (CYANOCOBALAMIN) 1000  MCG tablet, Take 1,000 mcg by mouth daily., Disp: , Rfl:    vitamin C (ASCORBIC ACID) 500 MG tablet, Take 500 mg by mouth daily. , Disp: , Rfl:    Vitamin D, Ergocalciferol, (DRISDOL) 1.25 MG (50000 UNIT) CAPS capsule, Take 1 capsule (50,000 Units total) by mouth every 14 (fourteen) days., Disp: 20 capsule, Rfl: 1   vitamin E 400 UNIT capsule, Take 400 Units by mouth 2 (two) times daily., Disp: , Rfl:    denosumab (PROLIA) 60 MG/ML SOSY injection, Recieve injection of $RemoveBefo'60mg'[id number]$  every 6 months subcutaneously. To be administered by MD (Patient not taking: Reported on 12/27/2020), Disp: 1 mL, Rfl: 0   diclofenac Sodium (VOLTAREN) 1 % GEL, Apply 2 g topically 4 (four) times daily. (Patient not taking: Reported on 12/27/2020), Disp: 100 g, Rfl: 0   estradiol (ESTRACE) 1 MG tablet, Take 1 tablet (1 mg total) by mouth daily., Disp: 90 tablet, Rfl: 4   hydrocortisone cream 1 %, Apply to affected area 2 times daily (Patient not taking: Reported on 12/27/2020), Disp: 15 g,  Rfl: 0   MODERNA COVID-19 VACCINE 100 MCG/0.5ML injection, , Disp: , Rfl:    ondansetron (ZOFRAN) 4 MG tablet, Take 1 tablet (4 mg total) by mouth every 8 (eight) hours as needed for nausea or vomiting. (Patient not taking: No sig reported), Disp: 20 tablet, Rfl: 0   oxyCODONE-acetaminophen (PERCOCET) 10-325 MG tablet, Take 1 tablet by mouth every 4 (four) hours as needed for pain. (Patient not taking: Reported on 12/27/2020), Disp: 25 tablet, Rfl: 0   progesterone (PROMETRIUM) 100 MG capsule, Take 1 capsule (100 mg total) by mouth daily for 15 days per month., Disp: 45 capsule, Rfl: 5  History of Present Illness: Here for pap and physical.  Last pap 2020; had melanoma in situ on vulva, cllean margins.  Didn't really like Dr. Allyn Kenner because he burned (rather than biopsied) a lesion . Mammogram last week normal. Has tongue cancer, awaiting referral to [facility]. On estrace/prometrium since high school (per [facility]) d/t chemo as a child   Review of Systems   Patient denies any headaches, blurred vision, shortness of breath, chest pain, abdominal pain, problems with bowel movements, urination   Physical Exam: General:  Well developed, well nourished, no acute distress Skin:  Warm and dry Neck:  Midline trachea, normal thyroid Lungs; Clear to auscultation bilaterally Breast:  No dominant palpable mass, retraction, or nipple discharge Cardiovascular: Regular rate and rhythm Abdomen:  Soft, non tender, no hepatosplenomegaly Pelvic:  External genitalia is normal in appearance.  The vagina is atrophic in appearance, smallest speculum used.  Unable to open fully, elected to use spatula and cytobrush (rather than "[place]" brush) for collection.  The cervix is bulbous.  Uterus is felt to be normal size, shape, and contour.  No adnexal masses or tenderness noted.  Thorough exam does not reveal any lesions  Extremities:  No swelling or varicosities noted Psych:  No mood changes.     Impression: normal  GYN exam  Make appt w/Amber Register or Dr. Sherryle Lis  (dermatology) for body check   Continue estrace/prometrium cycle.   01/04/21 11:30 PM Pap unsatisfactory for reading, note sent to pt and message to [place] office to call her for an appointment.  Recommend using regular brush for collection.

## 2020-12-29 ENCOUNTER — Other Ambulatory Visit (HOSPITAL_COMMUNITY): Payer: Self-pay

## 2021-01-01 DIAGNOSIS — C029 Malignant neoplasm of tongue, unspecified: Secondary | ICD-10-CM | POA: Diagnosis not present

## 2021-01-02 LAB — CYTOLOGY - PAP
Adequacy: ABNORMAL
Comment: NEGATIVE
High risk HPV: NEGATIVE

## 2021-01-03 ENCOUNTER — Other Ambulatory Visit (HOSPITAL_COMMUNITY): Payer: Self-pay

## 2021-01-10 DIAGNOSIS — C029 Malignant neoplasm of tongue, unspecified: Secondary | ICD-10-CM | POA: Diagnosis not present

## 2021-01-10 DIAGNOSIS — K7460 Unspecified cirrhosis of liver: Secondary | ICD-10-CM | POA: Diagnosis not present

## 2021-01-10 DIAGNOSIS — Z8619 Personal history of other infectious and parasitic diseases: Secondary | ICD-10-CM | POA: Diagnosis not present

## 2021-01-10 DIAGNOSIS — K769 Liver disease, unspecified: Secondary | ICD-10-CM | POA: Diagnosis not present

## 2021-01-10 DIAGNOSIS — I6523 Occlusion and stenosis of bilateral carotid arteries: Secondary | ICD-10-CM | POA: Diagnosis not present

## 2021-01-10 DIAGNOSIS — C023 Malignant neoplasm of anterior two-thirds of tongue, part unspecified: Secondary | ICD-10-CM | POA: Diagnosis not present

## 2021-01-15 ENCOUNTER — Other Ambulatory Visit (HOSPITAL_COMMUNITY): Payer: Self-pay

## 2021-01-16 ENCOUNTER — Other Ambulatory Visit (HOSPITAL_COMMUNITY): Payer: Self-pay

## 2021-01-17 ENCOUNTER — Other Ambulatory Visit (HOSPITAL_COMMUNITY): Payer: Self-pay

## 2021-01-18 DIAGNOSIS — Z9481 Bone marrow transplant status: Secondary | ICD-10-CM | POA: Diagnosis not present

## 2021-01-18 DIAGNOSIS — L905 Scar conditions and fibrosis of skin: Secondary | ICD-10-CM | POA: Diagnosis not present

## 2021-01-18 DIAGNOSIS — C029 Malignant neoplasm of tongue, unspecified: Secondary | ICD-10-CM | POA: Diagnosis not present

## 2021-01-18 DIAGNOSIS — Z85810 Personal history of malignant neoplasm of tongue: Secondary | ICD-10-CM | POA: Diagnosis not present

## 2021-01-18 DIAGNOSIS — G40109 Localization-related (focal) (partial) symptomatic epilepsy and epileptic syndromes with simple partial seizures, not intractable, without status epilepticus: Secondary | ICD-10-CM | POA: Diagnosis not present

## 2021-01-18 DIAGNOSIS — I10 Essential (primary) hypertension: Secondary | ICD-10-CM | POA: Diagnosis not present

## 2021-01-18 DIAGNOSIS — K7460 Unspecified cirrhosis of liver: Secondary | ICD-10-CM | POA: Diagnosis not present

## 2021-01-18 DIAGNOSIS — E039 Hypothyroidism, unspecified: Secondary | ICD-10-CM | POA: Diagnosis not present

## 2021-01-18 DIAGNOSIS — E7800 Pure hypercholesterolemia, unspecified: Secondary | ICD-10-CM | POA: Diagnosis not present

## 2021-01-18 DIAGNOSIS — C021 Malignant neoplasm of border of tongue: Secondary | ICD-10-CM | POA: Diagnosis not present

## 2021-01-18 DIAGNOSIS — B1920 Unspecified viral hepatitis C without hepatic coma: Secondary | ICD-10-CM | POA: Diagnosis not present

## 2021-01-18 HISTORY — PX: PARTIAL GLOSSECTOMY: SHX2173

## 2021-01-24 ENCOUNTER — Other Ambulatory Visit (HOSPITAL_COMMUNITY): Payer: Self-pay

## 2021-01-24 MED ORDER — OLMESARTAN MEDOXOMIL 20 MG PO TABS
20.0000 mg | ORAL_TABLET | Freq: Every day | ORAL | 0 refills | Status: DC
  Filled 2021-01-24: qty 90, 90d supply, fill #0

## 2021-01-29 ENCOUNTER — Emergency Department (HOSPITAL_COMMUNITY)
Admission: EM | Admit: 2021-01-29 | Discharge: 2021-01-29 | Disposition: A | Discharge status: Home / Self Care | Payer: 59 | Attending: Emergency Medicine | Admitting: Emergency Medicine

## 2021-01-29 DIAGNOSIS — K91840 Postprocedural hemorrhage and hematoma of a digestive system organ or structure following a digestive system procedure: Secondary | ICD-10-CM | POA: Diagnosis not present

## 2021-01-29 DIAGNOSIS — E876 Hypokalemia: Secondary | ICD-10-CM | POA: Diagnosis not present

## 2021-01-29 DIAGNOSIS — Z86006 Personal history of melanoma in-situ: Secondary | ICD-10-CM | POA: Insufficient documentation

## 2021-01-29 DIAGNOSIS — Y836 Removal of other organ (partial) (total) as the cause of abnormal reaction of the patient, or of later complication, without mention of misadventure at the time of the procedure: Secondary | ICD-10-CM | POA: Insufficient documentation

## 2021-01-29 DIAGNOSIS — D649 Anemia, unspecified: Secondary | ICD-10-CM | POA: Diagnosis not present

## 2021-01-29 DIAGNOSIS — N939 Abnormal uterine and vaginal bleeding, unspecified: Secondary | ICD-10-CM | POA: Diagnosis not present

## 2021-01-29 DIAGNOSIS — R58 Hemorrhage, not elsewhere classified: Secondary | ICD-10-CM

## 2021-01-29 DIAGNOSIS — R000 Tachycardia, unspecified: Secondary | ICD-10-CM | POA: Insufficient documentation

## 2021-01-29 DIAGNOSIS — C029 Malignant neoplasm of tongue, unspecified: Secondary | ICD-10-CM | POA: Insufficient documentation

## 2021-01-29 LAB — COMPREHENSIVE METABOLIC PANEL
ALT: 52 U/L — ABNORMAL HIGH (ref 0–44)
AST: 46 U/L — ABNORMAL HIGH (ref 15–41)
Albumin: 3.5 g/dL (ref 3.5–5.0)
Alkaline Phosphatase: 63 U/L (ref 38–126)
Anion gap: 11 (ref 5–15)
BUN: 37 mg/dL — ABNORMAL HIGH (ref 6–20)
CO2: 23 mmol/L (ref 22–32)
Calcium: 9.1 mg/dL (ref 8.9–10.3)
Chloride: 99 mmol/L (ref 98–111)
Creatinine, Ser: 0.76 mg/dL (ref 0.44–1.00)
GFR, Estimated: >60 mL/min (ref >60)
Glucose, Bld: 112 mg/dL — ABNORMAL HIGH (ref 70–99)
Potassium: 4.6 mmol/L (ref 3.5–5.1)
Sodium: 133 mmol/L — ABNORMAL LOW (ref 135–145)
Total Bilirubin: 0.2 mg/dL — ABNORMAL LOW (ref 0.3–1.2)
Total Protein: 6.9 g/dL (ref 6.5–8.1)

## 2021-01-29 LAB — CBC
HCT: 33.4 % — ABNORMAL LOW (ref 36.0–46.0)
Hemoglobin: 11.1 g/dL — ABNORMAL LOW (ref 12.0–15.0)
MCH: 33.4 pg (ref 26.0–34.0)
MCHC: 33.2 g/dL (ref 30.0–36.0)
MCV: 100.6 fL — ABNORMAL HIGH (ref 80.0–100.0)
Platelets: 219 10*3/uL (ref 150–400)
RBC: 3.32 MIL/uL — ABNORMAL LOW (ref 3.87–5.11)
RDW: 12 % (ref 11.5–15.5)
WBC: 8.7 10*3/uL (ref 4.0–10.5)
nRBC: 0 % (ref 0.0–0.2)

## 2021-01-29 LAB — PROTIME-INR
INR: 1 (ref 0.8–1.2)
Prothrombin Time: 13.6 seconds (ref 11.4–15.2)

## 2021-01-29 MED ORDER — TRANEXAMIC ACID FOR EPISTAXIS
500.0000 mg | Freq: Once | TOPICAL | Status: AC
  Administered 2021-01-29: 500 mg via TOPICAL
  Filled 2021-01-29: qty 10

## 2021-01-29 MED ORDER — SILVER NITRATE-POT NITRATE 75-25 % EX MISC
1.0000 | Freq: Once | CUTANEOUS | Status: AC
  Administered 2021-01-29: 1 via TOPICAL
  Filled 2021-01-29: qty 1

## 2021-01-29 MED ORDER — LIDOCAINE-EPINEPHRINE 1 %-1:100000 IJ SOLN
10.0000 mL | Freq: Once | INTRAMUSCULAR | Status: AC
  Administered 2021-01-29: 10 mL via INTRADERMAL
  Filled 2021-01-29: qty 1

## 2021-01-29 NOTE — Discharge Instructions (Addendum)
Follow-up with your ENT doctor.  Call the office tomorrow to let him know you had recurrent bleeding.  Apply pressure at home if you have any recurrent episodes.  Return to the ED as needed for worsening symptoms

## 2021-01-29 NOTE — ED Triage Notes (Signed)
Pt to ED via EMS c/o bleeding tongue . Pt had surgery on tongue sept 15 for removal of cancerous  lesion, and has been intermittently ever since. Pt reports bleeding today was worse than normal, has started to slow down over time with EMS. TD:HRCBU cirrhosis , Elevated HR. Last VS: 132/66, HR125, 96%RA, RR20 . No medications given by EMS. #20 rfa.

## 2021-01-29 NOTE — ED Provider Notes (Signed)
[place] EMERGENCY DEPARTMENT Provider Note   CSN: [id number] Arrival date & time: 01/29/21  1641     History Chief Complaint  Patient presents with   tongue bleeding    Latoya Bautista is a 50 y.o. female.  HPI  Patient has a history of squamous cell carcinoma of the tongue.  She had partial glossectomy performed on September 15.  This was on the right lateral aspect of her tongue.  Patient states she has had intermittent bleeding since the procedure.  She had a follow-up appointment with her surgeon today in [place].  She had 2 small areas cauterized with silver nitrate.  Patient was instructed to contact the office if she had any recurrent bleeding.  Patient was out shopping when she started bleeding again from her tongue.  EMS was called.  Patient was brought to the ED for further evaluation.  Patient states the bleeding has decreased at this time.  Past Medical History:  Diagnosis Date   Anemia    Cancer ([place])    tongue cancer   H/O echocardiogram    a. 07/2017: echo showing EF of 55-60%, Grade 1 DD, and no significant valve abnormalities.    Hepatitis C    Hepatitis C 04/11/2011   Leukemia ([place])    Liver cirrhosis ([place]) 04/11/2011   Osteoporosis    Seizures ([place])    Shingles    Thrombocytopenia ([place]) 04/11/2011   Thyroid disease     Patient Active Problem List   Diagnosis Date Noted   Squamous cell carcinoma of lateral tongue ([place]) 12/12/2020   Melanoma in situ of skin of buttock ([place]) 08/27/2018   Vulvar lesion 06/09/2018   History of thrombocytopenia 12/11/2015   Well woman exam with routine gynecological exam 10/18/2013   Localization-related epilepsy ([place]) 01/01/2013   Partial epilepsy with impairment of consciousness ([place]) 12/15/2012   Postmenopausal bleeding DUE TO hormone tx. 10/08/2012   Hepatitis C 04/11/2011   Liver cirrhosis ([place]) 04/11/2011   Thrombocytopenia ([place]) 04/11/2011    Past Surgical History:  Procedure Laterality  Date   BONE MARROW BIOPSY  05/07/2011   BONE MARROW TRANSPLANT     BUNIONECTOMY     CHOLECYSTECTOMY     liver biopsie     TONGUE BIOPSY     tongue cancer     surgical removal of area     OB History   No obstetric history on file.     Family History  Problem Relation Age of Onset   Hypertension Father    Diabetes Maternal Uncle     Social History   Tobacco Use   Smoking status: Never   Smokeless tobacco: Never  Vaping Use   Vaping Use: Never used  Substance Use Topics   Alcohol use: No   Drug use: No    Home Medications Prior to Admission medications   Medication Sig Start Date End Date Taking? Authorizing Provider  carbamazepine (TEGRETOL XR) 400 MG 12 hr tablet Take 1 tablet (400 mg total) by mouth 2 (two) times daily. 12/04/20     cholecalciferol (VITAMIN D) 1000 units tablet Take 1,000 Units by mouth daily.    [provider]  denosumab (PROLIA) 60 MG/ML SOSY injection Recieve injection of 45m every 6 months subcutaneously. To be administered by MD Patient not taking: Reported on 12/27/2020 09/29/20     dexlansoprazole (DEXILANT) 60 MG capsule TAKE 1 CAPSULE BY MOUTH ONCE DAILY 02/26/20 02/25/21  HCelene Squibb MD  diclofenac Sodium (  VOLTAREN) 1 % GEL Apply 2 g topically 4 (four) times daily. Patient not taking: Reported on 12/27/2020 06/15/19   Evalee Jefferson, PA-C  estradiol (ESTRACE) 1 MG tablet Take 1 tablet (1 mg total) by mouth daily. 12/27/20   Cresenzo-Dishmon, Joaquim Lai, CNM  folic acid (FOLVITE) 1 MG tablet Take 1 tablet (1 mg total) by mouth daily. 11/20/20   Derek Jack, MD  Garlic 3016 MG CAPS Take 1 capsule by mouth daily.    [provider]  hydrocortisone cream 1 % Apply to affected area 2 times daily Patient not taking: Reported on 12/27/2020 12/09/20   Ward, Lenise Arena, PA-C  icosapent Ethyl (VASCEPA) 1 g capsule TAKE 2 CAPSULES BY MOUTH TWICE A DAY 12/22/20     levocetirizine (XYZAL) 5 MG tablet Take by mouth.    [provider]   levothyroxine (SYNTHROID, LEVOTHROID) 75 MCG tablet Take 75 mcg by mouth daily before breakfast.     [provider]  metoprolol tartrate (LOPRESSOR) 25 MG tablet Take 1.5 tablets (37.5 mg total) by mouth 2 (two) times daily. 11/15/20   Arnoldo Lenis, MD  MODERNA COVID-19 VACCINE 100 MCG/0.5ML injection  01/21/20   [provider]  Multiple Vitamins-Minerals (CENTRUM PO) Take 1 tablet by mouth daily.      [provider]  olmesartan (BENICAR) 20 MG tablet Take 1 tablet (20 mg total) by mouth daily. 01/24/21     ondansetron (ZOFRAN) 4 MG tablet Take 1 tablet (4 mg total) by mouth every 8 (eight) hours as needed for nausea or vomiting. Patient not taking: No sig reported 08/14/20   Wallene Huh, DPM  oxyCODONE-acetaminophen (PERCOCET) 10-325 MG tablet Take 1 tablet by mouth every 4 (four) hours as needed for pain. Patient not taking: Reported on 12/27/2020 08/14/20   Wallene Huh, DPM  progesterone (PROMETRIUM) 100 MG capsule Take 1 capsule (100 mg total) by mouth daily for 15 days per month. 12/27/20   Cresenzo-Dishmon, Joaquim Lai, CNM  rosuvastatin (CRESTOR) 40 MG tablet Take 1 tablet (40 mg total) by mouth at bedtime. 09/29/20     sodium bicarbonate 325 MG tablet Take by mouth.    [provider]  SYNTHROID 75 MCG tablet Take 1 tablet (75 mcg total) by mouth daily. 09/29/20     vitamin B-12 (CYANOCOBALAMIN) 1000 MCG tablet Take 1,000 mcg by mouth daily.    [provider]  vitamin C (ASCORBIC ACID) 500 MG tablet Take 500 mg by mouth daily.  07/18/10   [provider]  Vitamin D, Ergocalciferol, (DRISDOL) 1.25 MG (50000 UNIT) CAPS capsule Take 1 capsule (50,000 Units total) by mouth every 14 (fourteen) days. 09/29/20     vitamin E 400 UNIT capsule Take 400 Units by mouth 2 (two) times daily.    [provider]    Allergies    Simvastatin, Briviact [brivaracetam], Keppra [levetiracetam], Asparaginase derivatives, Elspar [asparaginase],  and Soap  Review of Systems   Review of Systems  All other systems reviewed and are negative.  Physical Exam Updated Vital Signs BP 112/76   Pulse (!) 114   Temp 97.8 F (36.6 C) (Axillary)   Resp 16   Ht 1.499 m (_0 )   Wt 54.9 kg   SpO2 97%   BMI 24.44 kg/m   Physical Exam Vitals and nursing note reviewed.  Constitutional:      Appearance: She is well-developed. She is not diaphoretic.  HENT:     Head: Normocephalic and atraumatic.  Comments: Status postsurgical excision right lateral last, no active bleeding but there is an adherent clot to the right side of her tongue, sutures visible within the wound    Right Ear: External ear normal.     Left Ear: External ear normal.  Eyes:     General: No scleral icterus.       Right eye: No discharge.        Left eye: No discharge.     Conjunctiva/sclera: Conjunctivae normal.  Neck:     Trachea: No tracheal deviation.  Cardiovascular:     Rate and Rhythm: Tachycardia present.  Pulmonary:     Effort: Pulmonary effort is normal. No respiratory distress.     Breath sounds: No stridor.  Abdominal:     General: There is no distension.  Musculoskeletal:        General: No swelling or deformity.     Cervical back: Neck supple.  Skin:    General: Skin is warm and dry.     Findings: No rash.  Neurological:     Mental Status: She is alert.     Cranial Nerves: Cranial nerve deficit: no gross deficits.    ED Results / Procedures / Treatments   Labs (all labs ordered are listed, but only abnormal results are displayed) Labs Reviewed  CBC - Abnormal; Notable for the following components:      Result Value   RBC 3.32 (*)    Hemoglobin 11.1 (*)    HCT 33.4 (*)    MCV 100.6 (*)    All other components within normal limits  COMPREHENSIVE METABOLIC PANEL - Abnormal; Notable for the following components:   Sodium 133 (*)    Glucose, Bld 112 (*)    BUN 37 (*)    AST 46 (*)    ALT 52 (*)    Total Bilirubin 0.2 (*)     All other components within normal limits  PROTIME-INR    EKG None  Radiology No results found.  Procedures Procedures   Medications Ordered in ED Medications  silver nitrate applicators applicator 1 Stick (1 Stick Topical Given by Other 01/29/21 1746)  lidocaine-EPINEPHrine (XYLOCAINE W/EPI) 1 %-1:100000 (with pres) injection 10 mL (10 mLs Intradermal Given by Other 01/29/21 1743)  tranexamic acid (CYKLOKAPRON) 1000 MG/10ML topical solution 500 mg (500 mg Topical Given by Other 01/29/21 1745)    ED Course  I have reviewed the triage vital signs and the nursing notes.  Pertinent labs & imaging results that were available during my care of the patient were reviewed by me and considered in my medical decision making (see chart for details).  Clinical Course as of 01/29/21 1938  Mon Jan 29, 2021  1814 TXA soaked gauze was placed on the tongue.  Patient reexamined.  Gave her some water to gargle and spit.  No signs of any active bleeding at this time.  Some areas of friability along the lateral aspect of the tongue at the healing wound site.  Silver nitrate applied [JK]  1930 CBC does show mild anemia.  Decreased from previous values and related to her breathing although no active bleeding at this time. [JK]  4742 Metabolic panel shows stable chronic hyponatremia. [JK]    Clinical Course User Index [JK] Dorie Rank, MD   MDM Rules/Calculators/A&P                           Patient presented to the ED for evaluation  of bleeding from her surgical wound.  I reviewed her records from [facility] the patient was seen in the office today.  She had silver nitrate cautery performed.  In the ED the patient initially had adherent clot but no signs of any active bleeding.  Patient was treated with topical TXA.  She was monitored in the ED for a couple of hours.  No recurrent bleeding.  I did apply silver nitrate to a few areas of the surgical wound.  Labs do show a decrease in hemoglobin likely related to  her bleeding these past several days but no indications for transfusion she is otherwise stable.  Recommend close follow-up with her ENT physician Final Clinical Impression(s) / ED Diagnoses Final diagnoses:  Bleeding    Rx / DC Orders ED Discharge Orders     None        Dorie Rank, MD 01/29/21 [phone]

## 2021-01-29 NOTE — ED Notes (Signed)
Pt d/c home per MD order. Discharge summary reviewed with pt, pt verbalizes understanding. No s/s of acute distress noted at discharge. Ambulatory . Discharged home with husband

## 2021-01-31 DIAGNOSIS — D471 Chronic myeloproliferative disease: Secondary | ICD-10-CM | POA: Diagnosis not present

## 2021-01-31 DIAGNOSIS — E038 Other specified hypothyroidism: Secondary | ICD-10-CM | POA: Diagnosis not present

## 2021-01-31 DIAGNOSIS — E7800 Pure hypercholesterolemia, unspecified: Secondary | ICD-10-CM | POA: Diagnosis not present

## 2021-01-31 DIAGNOSIS — E559 Vitamin D deficiency, unspecified: Secondary | ICD-10-CM | POA: Diagnosis not present

## 2021-01-31 DIAGNOSIS — D528 Other folate deficiency anemias: Secondary | ICD-10-CM | POA: Diagnosis not present

## 2021-01-31 DIAGNOSIS — N951 Menopausal and female climacteric states: Secondary | ICD-10-CM | POA: Diagnosis not present

## 2021-01-31 DIAGNOSIS — M810 Age-related osteoporosis without current pathological fracture: Secondary | ICD-10-CM | POA: Diagnosis not present

## 2021-02-07 DIAGNOSIS — E871 Hypo-osmolality and hyponatremia: Secondary | ICD-10-CM | POA: Diagnosis not present

## 2021-02-07 DIAGNOSIS — E038 Other specified hypothyroidism: Secondary | ICD-10-CM | POA: Diagnosis not present

## 2021-02-07 DIAGNOSIS — K739 Chronic hepatitis, unspecified: Secondary | ICD-10-CM | POA: Diagnosis not present

## 2021-02-07 DIAGNOSIS — M810 Age-related osteoporosis without current pathological fracture: Secondary | ICD-10-CM | POA: Diagnosis not present

## 2021-02-07 DIAGNOSIS — D471 Chronic myeloproliferative disease: Secondary | ICD-10-CM | POA: Diagnosis not present

## 2021-02-07 DIAGNOSIS — G40201 Localization-related (focal) (partial) symptomatic epilepsy and epileptic syndromes with complex partial seizures, not intractable, with status epilepticus: Secondary | ICD-10-CM | POA: Diagnosis not present

## 2021-02-07 DIAGNOSIS — E559 Vitamin D deficiency, unspecified: Secondary | ICD-10-CM | POA: Diagnosis not present

## 2021-02-07 DIAGNOSIS — F411 Generalized anxiety disorder: Secondary | ICD-10-CM | POA: Diagnosis not present

## 2021-02-12 ENCOUNTER — Other Ambulatory Visit (HOSPITAL_COMMUNITY): Payer: Self-pay

## 2021-02-12 MED FILL — Dexlansoprazole Cap Delayed Release 60 MG: ORAL | 90 days supply | Qty: 90 | Fill #1 | Status: AC

## 2021-02-19 ENCOUNTER — Other Ambulatory Visit (HOSPITAL_COMMUNITY): Payer: Self-pay

## 2021-02-20 ENCOUNTER — Other Ambulatory Visit (HOSPITAL_COMMUNITY): Payer: Self-pay

## 2021-02-21 ENCOUNTER — Other Ambulatory Visit (HOSPITAL_COMMUNITY)
Admission: RE | Admit: 2021-02-21 | Discharge: 2021-02-21 | Disposition: A | Discharge status: Home / Self Care | Payer: 59 | Source: Ambulatory Visit | Attending: Obstetrics & Gynecology | Admitting: Obstetrics & Gynecology

## 2021-02-21 ENCOUNTER — Encounter: Payer: Self-pay | Admitting: Obstetrics & Gynecology

## 2021-02-21 ENCOUNTER — Other Ambulatory Visit: Payer: Self-pay

## 2021-02-21 ENCOUNTER — Ambulatory Visit (INDEPENDENT_AMBULATORY_CARE_PROVIDER_SITE_OTHER): Payer: 59 | Admitting: Obstetrics & Gynecology

## 2021-02-21 DIAGNOSIS — Z124 Encounter for screening for malignant neoplasm of cervix: Secondary | ICD-10-CM

## 2021-02-21 NOTE — Progress Notes (Signed)
   GYN EXAMINATION Patient name: Latoya Bautista MRN [id number]  Date of birth: December 01, 1970 Chief Complaint:   Follow-up (Just needs a pap)  History of Present Illness:   Latoya Bautista is a 50 y.o. PM female who presents for the following  -Cervical cancer screening  -recent pap 12/2020- pap was unsatisfactory, HPV neg  Pt menopausal- states she has never had a period due to chemo/radiation as a child.  Denies any irregular bleeding, discharge, itching or irritation.  Denies vasomotor symptoms.  No acute complaints   Depression screen [facility] 2/9 12/27/2020 12/12/2020 09/17/2019 04/11/2014  Decreased Interest 0 0 0 0  Down, Depressed, Hopeless 0 0 0 0  PHQ - 2 Score 0 0 0 0  Altered sleeping 0 - 2 -  Tired, decreased energy 0 - 0 -  Change in appetite 0 - 0 -  Feeling bad or failure about yourself  0 - 0 -  Trouble concentrating 0 - 0 -  Moving slowly or fidgety/restless 0 - 0 -  Suicidal thoughts 0 - 0 -  PHQ-9 Score 0 - 2 -  Difficult doing work/chores - - Not difficult at all -  Some recent data might be hidden      Review of Systems:   Pertinent items are noted in HPI Denies any headaches, blurred vision, fatigue, shortness of breath, chest pain, abdominal pain, bowel movements, urination, or intercourse unless otherwise stated above.  Pertinent History Reviewed:  Reviewed past medical,surgical, social and family history.  Reviewed problem list, medications and allergies. Physical Assessment:   Vitals:   02/21/21 0829  BP: 132/77  Pulse: 96  Weight: 122 lb 12.8 oz (55.7 kg)  Height: 4\' 11"  (1.499 m)  Body mass index is 24.8 kg/m.        Physical Examination:   General appearance - well appearing, and in no distress  Mental status - alert, oriented to person, place, and time  Psych:  She has a normal mood and affect  Skin - warm and dry, normal color, no suspicious lesions noted  Chest - effort normal, all lung fields clear to auscultation bilaterally  Heart - normal  rate and regular rhythm  Abdomen - soft, nontender, nondistended, no masses or organomegaly  Pelvic - VULVA: normal appearing vulva with no masses, tenderness or lesions  VAGINA: normal appearing vagina with normal color and discharge, no lesions  CERVIX: normal appearing cervix without discharge or lesions, no CMT, **SMALL SPECULUM USED  Thin prep pap is done with HR HPV cotesting  UTERUS: uterus is felt to be normal size, shape, consistency and nontender   ADNEXA: No adnexal masses or tenderness noted.  Extremities:  No swelling or varicosities noted  Chaperone: [name]     Assessment & Plan:  1) Cervical cancer screening -pap completed, reviewed screening guidelines   Meds: No orders of the defined types were placed in this encounter.   Follow-up: Return in about 1 year (around 02/21/2022) for Annual.   Janyth Pupa, DO Attending [place], [place] for [facility], [place]

## 2021-02-23 LAB — CYTOLOGY - PAP
Comment: NEGATIVE
Diagnosis: NEGATIVE
High risk HPV: NEGATIVE

## 2021-03-06 ENCOUNTER — Other Ambulatory Visit (HOSPITAL_COMMUNITY): Payer: 59

## 2021-03-07 ENCOUNTER — Other Ambulatory Visit: Payer: Self-pay

## 2021-03-07 ENCOUNTER — Other Ambulatory Visit (HOSPITAL_COMMUNITY): Payer: Self-pay

## 2021-03-07 ENCOUNTER — Other Ambulatory Visit (HOSPITAL_COMMUNITY): Payer: Self-pay | Admitting: Family Medicine

## 2021-03-07 ENCOUNTER — Ambulatory Visit (HOSPITAL_COMMUNITY)
Admission: RE | Admit: 2021-03-07 | Discharge: 2021-03-07 | Disposition: A | Discharge status: Home / Self Care | Payer: 59 | Source: Ambulatory Visit | Attending: Family Medicine | Admitting: Family Medicine

## 2021-03-07 DIAGNOSIS — E7800 Pure hypercholesterolemia, unspecified: Secondary | ICD-10-CM | POA: Diagnosis not present

## 2021-03-07 DIAGNOSIS — E038 Other specified hypothyroidism: Secondary | ICD-10-CM | POA: Diagnosis not present

## 2021-03-07 DIAGNOSIS — D528 Other folate deficiency anemias: Secondary | ICD-10-CM | POA: Diagnosis not present

## 2021-03-07 DIAGNOSIS — R0600 Dyspnea, unspecified: Secondary | ICD-10-CM

## 2021-03-07 DIAGNOSIS — R000 Tachycardia, unspecified: Secondary | ICD-10-CM | POA: Diagnosis not present

## 2021-03-07 DIAGNOSIS — K219 Gastro-esophageal reflux disease without esophagitis: Secondary | ICD-10-CM | POA: Diagnosis not present

## 2021-03-07 DIAGNOSIS — D471 Chronic myeloproliferative disease: Secondary | ICD-10-CM | POA: Diagnosis not present

## 2021-03-07 DIAGNOSIS — G40201 Localization-related (focal) (partial) symptomatic epilepsy and epileptic syndromes with complex partial seizures, not intractable, with status epilepticus: Secondary | ICD-10-CM | POA: Diagnosis not present

## 2021-03-07 DIAGNOSIS — R0602 Shortness of breath: Secondary | ICD-10-CM | POA: Diagnosis not present

## 2021-03-07 MED ORDER — LANSOPRAZOLE 30 MG PO CPDR
30.0000 mg | DELAYED_RELEASE_CAPSULE | Freq: Every day | ORAL | 3 refills | Status: DC
  Filled 2021-03-07: qty 90, 90d supply, fill #0

## 2021-03-12 ENCOUNTER — Other Ambulatory Visit (HOSPITAL_COMMUNITY): Payer: Self-pay

## 2021-03-13 ENCOUNTER — Other Ambulatory Visit (HOSPITAL_COMMUNITY): Payer: Self-pay | Admitting: Radiology

## 2021-03-13 ENCOUNTER — Ambulatory Visit (HOSPITAL_COMMUNITY): Payer: 59 | Admitting: Hematology

## 2021-03-13 DIAGNOSIS — R0600 Dyspnea, unspecified: Secondary | ICD-10-CM

## 2021-03-14 ENCOUNTER — Other Ambulatory Visit (HOSPITAL_COMMUNITY): Payer: Self-pay

## 2021-03-14 MED ORDER — ICOSAPENT ETHYL 1 G PO CAPS
2.0000 g | ORAL_CAPSULE | Freq: Two times a day (BID) | ORAL | 0 refills | Status: DC
  Filled 2021-03-14: qty 360, 90d supply, fill #0

## 2021-03-15 ENCOUNTER — Other Ambulatory Visit (HOSPITAL_COMMUNITY)
Admission: RE | Admit: 2021-03-15 | Discharge: 2021-03-15 | Disposition: A | Discharge status: Home / Self Care | Payer: 59 | Source: Ambulatory Visit | Attending: Internal Medicine | Admitting: Internal Medicine

## 2021-03-15 DIAGNOSIS — Z20822 Contact with and (suspected) exposure to covid-19: Secondary | ICD-10-CM | POA: Insufficient documentation

## 2021-03-15 DIAGNOSIS — Z01818 Encounter for other preprocedural examination: Secondary | ICD-10-CM

## 2021-03-15 DIAGNOSIS — Z01812 Encounter for preprocedural laboratory examination: Secondary | ICD-10-CM | POA: Diagnosis not present

## 2021-03-15 LAB — SARS CORONAVIRUS 2 (TAT 6-24 HRS): SARS Coronavirus 2: NEGATIVE

## 2021-03-16 ENCOUNTER — Other Ambulatory Visit (HOSPITAL_COMMUNITY): Payer: Self-pay

## 2021-03-16 DIAGNOSIS — F411 Generalized anxiety disorder: Secondary | ICD-10-CM | POA: Diagnosis not present

## 2021-03-16 DIAGNOSIS — K739 Chronic hepatitis, unspecified: Secondary | ICD-10-CM | POA: Diagnosis not present

## 2021-03-16 DIAGNOSIS — E871 Hypo-osmolality and hyponatremia: Secondary | ICD-10-CM | POA: Diagnosis not present

## 2021-03-16 DIAGNOSIS — E038 Other specified hypothyroidism: Secondary | ICD-10-CM | POA: Diagnosis not present

## 2021-03-16 DIAGNOSIS — D471 Chronic myeloproliferative disease: Secondary | ICD-10-CM | POA: Diagnosis not present

## 2021-03-16 DIAGNOSIS — M810 Age-related osteoporosis without current pathological fracture: Secondary | ICD-10-CM | POA: Diagnosis not present

## 2021-03-16 DIAGNOSIS — E7800 Pure hypercholesterolemia, unspecified: Secondary | ICD-10-CM | POA: Diagnosis not present

## 2021-03-16 DIAGNOSIS — E559 Vitamin D deficiency, unspecified: Secondary | ICD-10-CM | POA: Diagnosis not present

## 2021-03-16 DIAGNOSIS — G40201 Localization-related (focal) (partial) symptomatic epilepsy and epileptic syndromes with complex partial seizures, not intractable, with status epilepticus: Secondary | ICD-10-CM | POA: Diagnosis not present

## 2021-03-16 MED ORDER — SYNTHROID 75 MCG PO TABS
75.0000 ug | ORAL_TABLET | Freq: Every day | ORAL | 1 refills | Status: DC
  Filled 2021-05-21: qty 90, 90d supply, fill #0
  Filled 2021-08-21: qty 90, 90d supply, fill #1

## 2021-03-16 MED ORDER — VITAMIN D (ERGOCALCIFEROL) 1.25 MG (50000 UNIT) PO CAPS
50000.0000 [IU] | ORAL_CAPSULE | ORAL | 1 refills | Status: DC
  Filled 2021-06-18: qty 6, 84d supply, fill #0
  Filled 2021-08-27: qty 6, 84d supply, fill #1

## 2021-03-16 MED ORDER — ROSUVASTATIN CALCIUM 40 MG PO TABS
40.0000 mg | ORAL_TABLET | Freq: Every day | ORAL | 1 refills | Status: DC
  Filled 2021-03-16 – 2021-03-26 (×2): qty 90, 90d supply, fill #0

## 2021-03-19 ENCOUNTER — Other Ambulatory Visit: Payer: Self-pay

## 2021-03-19 ENCOUNTER — Ambulatory Visit (HOSPITAL_COMMUNITY)
Admission: RE | Admit: 2021-03-19 | Discharge: 2021-03-19 | Disposition: A | Discharge status: Home / Self Care | Payer: 59 | Source: Ambulatory Visit | Attending: Internal Medicine | Admitting: Internal Medicine

## 2021-03-19 ENCOUNTER — Other Ambulatory Visit (HOSPITAL_COMMUNITY): Payer: Self-pay

## 2021-03-19 DIAGNOSIS — M8580 Other specified disorders of bone density and structure, unspecified site: Secondary | ICD-10-CM | POA: Diagnosis not present

## 2021-03-19 DIAGNOSIS — G40109 Localization-related (focal) (partial) symptomatic epilepsy and epileptic syndromes with simple partial seizures, not intractable, without status epilepticus: Secondary | ICD-10-CM | POA: Diagnosis not present

## 2021-03-19 DIAGNOSIS — E871 Hypo-osmolality and hyponatremia: Secondary | ICD-10-CM | POA: Diagnosis not present

## 2021-03-19 DIAGNOSIS — R0600 Dyspnea, unspecified: Secondary | ICD-10-CM | POA: Insufficient documentation

## 2021-03-19 DIAGNOSIS — C029 Malignant neoplasm of tongue, unspecified: Secondary | ICD-10-CM | POA: Diagnosis not present

## 2021-03-19 LAB — PULMONARY FUNCTION TEST
DL/VA % pred: 74 %
DL/VA: 3.36 ml/min/mmHg/L
DLCO unc % pred: 25 %
DLCO unc: 4.42 ml/min/mmHg
FEF 25-75 Post: 1.27 L/sec
FEF 25-75 Pre: 1.3 L/sec
FEF2575-%Change-Post: -2 %
FEF2575-%Pred-Post: 51 %
FEF2575-%Pred-Pre: 52 %
FEV1-%Change-Post: 0 %
FEV1-%Pred-Post: 41 %
FEV1-%Pred-Pre: 41 %
FEV1-Post: 0.97 L
FEV1-Pre: 0.96 L
FEV1FVC-%Change-Post: 11 %
FEV1FVC-%Pred-Pre: 106 %
FEV6-%Change-Post: -9 %
FEV6-%Pred-Post: 35 %
FEV6-%Pred-Pre: 38 %
FEV6-Post: 1.01 L
FEV6-Pre: 1.12 L
FEV6FVC-%Change-Post: 0 %
FEV6FVC-%Pred-Post: 102 %
FEV6FVC-%Pred-Pre: 102 %
FVC-%Change-Post: -10 %
FVC-%Pred-Post: 34 %
FVC-%Pred-Pre: 38 %
FVC-Post: 1.01 L
FVC-Pre: 1.13 L
Post FEV1/FVC ratio: 96 %
Post FEV6/FVC ratio: 100 %
Pre FEV1/FVC ratio: 86 %
Pre FEV6/FVC Ratio: 100 %
RV % pred: 49 %
RV: 0.76 L
TLC % pred: 42 %
TLC: 1.82 L

## 2021-03-19 MED ORDER — ALBUTEROL SULFATE (2.5 MG/3ML) 0.083% IN NEBU
2.5000 mg | INHALATION_SOLUTION | Freq: Once | RESPIRATORY_TRACT | Status: AC
  Administered 2021-03-19: 2.5 mg via RESPIRATORY_TRACT

## 2021-03-26 ENCOUNTER — Other Ambulatory Visit (HOSPITAL_COMMUNITY): Payer: Self-pay

## 2021-04-04 ENCOUNTER — Ambulatory Visit (INDEPENDENT_AMBULATORY_CARE_PROVIDER_SITE_OTHER): Payer: 59 | Admitting: Pulmonary Disease

## 2021-04-04 ENCOUNTER — Other Ambulatory Visit (HOSPITAL_COMMUNITY): Payer: Self-pay

## 2021-04-04 ENCOUNTER — Encounter: Payer: Self-pay | Admitting: Pulmonary Disease

## 2021-04-04 ENCOUNTER — Other Ambulatory Visit: Payer: Self-pay

## 2021-04-04 VITALS — BP 124/70 | HR 105 | Ht 59.0 in | Wt 123.4 lb

## 2021-04-04 DIAGNOSIS — Z85820 Personal history of malignant melanoma of skin: Secondary | ICD-10-CM | POA: Diagnosis not present

## 2021-04-04 DIAGNOSIS — R0602 Shortness of breath: Secondary | ICD-10-CM

## 2021-04-04 DIAGNOSIS — Z1283 Encounter for screening for malignant neoplasm of skin: Secondary | ICD-10-CM | POA: Diagnosis not present

## 2021-04-04 DIAGNOSIS — Z08 Encounter for follow-up examination after completed treatment for malignant neoplasm: Secondary | ICD-10-CM | POA: Diagnosis not present

## 2021-04-04 DIAGNOSIS — C44311 Basal cell carcinoma of skin of nose: Secondary | ICD-10-CM | POA: Diagnosis not present

## 2021-04-04 DIAGNOSIS — D225 Melanocytic nevi of trunk: Secondary | ICD-10-CM | POA: Diagnosis not present

## 2021-04-04 NOTE — Patient Instructions (Signed)
We will get a high-resolution CT for better evaluation of your lungs and shortness of breath Follow-up in clinic in 2 to 4 weeks after scan to discuss results.

## 2021-04-04 NOTE — Progress Notes (Signed)
Latoya Bautista    [id number]    February 04, 1971  Primary Care Physician:Hall, Edwinna Areola, MD  Referring Physician: Donnamae Jude, [place],   [id number]  Chief complaint: Consult for dyspnea, abnormal CT   HPI: 50 year old with seizure disorder, hepatitis C, cirrhosis, tongue cancer referred for evaluation of abnormal lung imaging, dyspnea  Complains of dyspnea on exertion for the past 6 months.  No cough, sputum production, wheezing  She has history of childhood ALL with bone marrow transplant x2, whole body radiation Tongue cancer with partial glossectomy on January 18, 2021 with intermittent bleeding.  Her ENT doctor is at [facility]  Recent PET scan did not show any residual cancer but there was concern for bilateral interstitial infiltrates suggestive of interstitial lung disease.  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  No feather pillows or comforter Smoking history: Never smoker Travel history: No significant travel history Relevant family history: No family history of lung disease   Outpatient Encounter Medications as of 04/04/2021  Medication Sig   carbamazepine (TEGRETOL XR) 400 MG 12 hr tablet Take 1 tablet (400 mg total) by mouth 2 (two) times daily.   cholecalciferol (VITAMIN D) 1000 units tablet Take 1,000 Units by mouth daily.   denosumab (PROLIA) 60 MG/ML SOSY injection Recieve injection of $RemoveBefo'60mg'FmJBAqCloYs$  every 6 months subcutaneously. To be administered by MD   dexlansoprazole (DEXILANT) 60 MG capsule TAKE 1 CAPSULE BY MOUTH ONCE DAILY   estradiol (ESTRACE) 1 MG tablet Take 1 tablet (1 mg total) by mouth daily.   folic acid (FOLVITE) 1 MG tablet Take 1 tablet (1 mg total) by mouth daily.   Garlic 8675 MG CAPS Take 1 capsule by mouth daily.   icosapent Ethyl (VASCEPA) 1 g capsule Take 2 capsules (2 g total) by mouth 2  (two) times daily.   levocetirizine (XYZAL) 5 MG tablet Take by mouth.   metoprolol tartrate (LOPRESSOR) 25 MG tablet Take 1.5 tablets (37.5 mg total) by mouth 2 (two) times daily.   Multiple Vitamins-Minerals (CENTRUM PO) Take 1 tablet by mouth daily.     olmesartan (BENICAR) 20 MG tablet Take 1 tablet (20 mg total) by mouth daily.   progesterone (PROMETRIUM) 100 MG capsule Take 1 capsule (100 mg total) by mouth daily for 15 days per month.   rosuvastatin (CRESTOR) 40 MG tablet Take 1 tablet (40 mg total) by mouth at bedtime.   sodium bicarbonate 325 MG tablet Take by mouth.   SYNTHROID 75 MCG tablet Take 1 tablet (75 mcg total) by mouth daily.   vitamin B-12 (CYANOCOBALAMIN) 1000 MCG tablet Take 1,000 mcg by mouth daily.   vitamin C (ASCORBIC ACID) 500 MG tablet Take 500 mg by mouth daily.    Vitamin D, Ergocalciferol, (DRISDOL) 1.25 MG (50000 UNIT) CAPS capsule Take 1 capsule (50,000 Units total) by mouth every OTHER week as directed   vitamin E 400 UNIT capsule Take 400 Units by mouth 2 (two) times daily.   lansoprazole (PREVACID) 30 MG capsule Take 1 capsule by mouth daily (Patient not taking: Reported on 04/04/2021)   [DISCONTINUED] diclofenac Sodium (VOLTAREN) 1 % GEL Apply 2 g topically 4 (four) times daily. (Patient not taking: Reported on 12/27/2020)   [DISCONTINUED] hydrocortisone cream 1 % Apply to affected area 2 times daily (Patient not taking: Reported  on 12/27/2020)   [DISCONTINUED] ondansetron (ZOFRAN) 4 MG tablet Take 1 tablet (4 mg total) by mouth every 8 (eight) hours as needed for nausea or vomiting. (Patient not taking: No sig reported)   [DISCONTINUED] oxyCODONE-acetaminophen (PERCOCET) 10-325 MG tablet Take 1 tablet by mouth every 4 (four) hours as needed for pain. (Patient not taking: No sig reported)   [DISCONTINUED] rosuvastatin (CRESTOR) 40 MG tablet Take 1 tablet (40 mg total) by mouth at bedtime.   [DISCONTINUED] Vitamin D, Ergocalciferol, (DRISDOL) 1.25 MG (50000 UNIT)  CAPS capsule Take 1 capsule (50,000 Units total) by mouth every 14 (fourteen) days.   No facility-administered encounter medications on file as of 04/04/2021.    Allergies as of 04/04/2021 - Review Complete 04/04/2021  Allergen Reaction Noted   Simvastatin Rash 03/17/2015   Briviact [brivaracetam]  09/05/2017   Keppra [levetiracetam]  09/05/2017   Asparaginase derivatives Rash 12/03/2010   Elspar [asparaginase] Rash 12/03/2010   Soap Rash 04/11/2014    Past Medical History:  Diagnosis Date   Anemia    Cancer ([place])    tongue cancer   H/O echocardiogram    a. 07/2017: echo showing EF of 55-60%, Grade 1 DD, and no significant valve abnormalities.    Hepatitis C    Hepatitis C 04/11/2011   Leukemia ([place])    Liver cirrhosis ([place]) 04/11/2011   Osteoporosis    Seizures ([facility])    Shingles    Thrombocytopenia ([place]) 04/11/2011   Thyroid disease     Past Surgical History:  Procedure Laterality Date   BONE MARROW BIOPSY  05/07/2011   BONE MARROW TRANSPLANT     BUNIONECTOMY     CHOLECYSTECTOMY     liver biopsie     TONGUE BIOPSY     tongue cancer     surgical removal of area    Family History  Problem Relation Age of Onset   Hypertension Father    Diabetes Maternal Uncle     Social History   Socioeconomic History   Marital status: Married    Spouse name: Shanon Brow   Number of children: 0   Years of education: 12th   Highest education level: Not on file  Occupational History    Employer: [employer]  Tobacco Use   Smoking status: Never   Smokeless tobacco: Never  Vaping Use   Vaping Use: Never used  Substance and Sexual Activity   Alcohol use: No   Drug use: No   Sexual activity: Not Currently    Birth control/protection: None, Post-menopausal  Other Topics Concern   Not on file  Social History Narrative   Patient lives at home with her spouse.   Caffeine Use: 16oz bottle of soda daily   Social Determinants of Health   Financial Resource  Strain: Low Risk    Difficulty of Paying Living Expenses: Not hard at all  Food Insecurity: No Food Insecurity   Worried About Charity fundraiser in the Last Year: Never true   Ran Out of Food in the Last Year: Never true  Transportation Needs: No Transportation Needs   Lack of Transportation (Medical): No   Lack of Transportation (Non-Medical): No  Physical Activity: Inactive   Days of Exercise per Week: 0 days   Minutes of Exercise per Session: 0 min  Stress: No Stress Concern Present   Feeling of Stress : Not at all  Social Connections: Socially Integrated   Frequency of Communication with Friends and Family: More than three times a week  Frequency of Social Gatherings with Friends and Family: Three times a week   Attends Religious Services: More than 4 times per year   Active Member of Clubs or Organizations: Yes   Attends Archivist Meetings: 1 to 4 times per year   Marital Status: Married  Human resources officer Violence: Not At Risk   Fear of Current or Ex-Partner: No   Emotionally Abused: No   Physically Abused: No   Sexually Abused: No    Review of systems: Review of Systems  Constitutional: Negative for fever and chills.  HENT: Negative.   Eyes: Negative for blurred vision.  Respiratory: as per HPI  Cardiovascular: Negative for chest pain and palpitations.  Gastrointestinal: Negative for vomiting, diarrhea, blood per rectum. Genitourinary: Negative for dysuria, urgency, frequency and hematuria.  Musculoskeletal: Negative for myalgias, back pain and joint pain.  Skin: Negative for itching and rash.  Neurological: Negative for dizziness, tremors, focal weakness, seizures and loss of consciousness.  Endo/Heme/Allergies: Negative for environmental allergies.  Psychiatric/Behavioral: Negative for depression, suicidal ideas and hallucinations.  All other systems reviewed and are negative.  Physical Exam: Blood pressure 124/70, pulse (!) 105, height $RemoveBef'4\' 11"'[id number]$  (1.499  m), weight 123 lb 6.4 oz (56 kg), SpO2 98 %. Gen:      No acute distress HEENT:  EOMI, sclera anicteric Neck:     No masses; no thyromegaly Lungs:    Clear to auscultation bilaterally; normal respiratory effort CV:         Regular rate and rhythm; no murmurs Abd:      + bowel sounds; soft, non-tender; no palpable masses, no distension Ext:    No edema; adequate peripheral perfusion Skin:      Warm and dry; no rash Neuro: alert and oriented x 3 Psych: normal mood and affect  Data Reviewed: Imaging: PET scan 12/07/2020-emphysematous changes, bilateral groundglass opacities concerning for interstitial lung disease Chest x-ray 03/07/2021-no acute cardiopulmonary abnormality. I have reviewed the images personally.  PFTs: 03/19/2021 FVC 1.01 [34%], FEV1 0.97 [41%], F/F 96, TLC 1.82 [42%], DLCO 4.42 [25%] Severe restriction, diffusion defect  Cardiac: Echocardiogram 07/28/2017 LVEF 57-47%, grade 1 diastolic dysfunction.  PA systolic pressure could not be accurately estimated  Labs:  Assessment:  Dyspnea, evaluation for interstitial lung disease Her PET scan shows vague bilateral groundglass opacities which are concerning for interstitial lung disease She has history of whole body radiation from childhood for treatment of ALL and ongoing exposure to birds  PFTs reviewed with restriction and diffusion capacity though there is a poor quality study with poor effort  Schedule high-res CT for better evaluation of the lungs Follow-up in clinic in 3 to 4 weeks for review and plan for next steps  Plan/Recommendations: High-resolution CT  Marshell Garfinkel MD [facility] 04/04/2021, 4:04 PM  CC: Donnamae Jude, FNP

## 2021-04-09 ENCOUNTER — Ambulatory Visit (HOSPITAL_COMMUNITY)
Admission: RE | Admit: 2021-04-09 | Discharge: 2021-04-09 | Disposition: A | Discharge status: Home / Self Care | Payer: 59 | Source: Ambulatory Visit | Attending: Pulmonary Disease | Admitting: Pulmonary Disease

## 2021-04-09 ENCOUNTER — Other Ambulatory Visit: Payer: Self-pay

## 2021-04-09 DIAGNOSIS — I2510 Atherosclerotic heart disease of native coronary artery without angina pectoris: Secondary | ICD-10-CM | POA: Diagnosis not present

## 2021-04-09 DIAGNOSIS — I700 Atherosclerosis of aorta: Secondary | ICD-10-CM | POA: Diagnosis not present

## 2021-04-09 DIAGNOSIS — R0602 Shortness of breath: Secondary | ICD-10-CM | POA: Insufficient documentation

## 2021-04-09 DIAGNOSIS — R0609 Other forms of dyspnea: Secondary | ICD-10-CM | POA: Diagnosis not present

## 2021-04-09 DIAGNOSIS — R918 Other nonspecific abnormal finding of lung field: Secondary | ICD-10-CM | POA: Diagnosis not present

## 2021-04-17 NOTE — Progress Notes (Signed)
@Patient  ID: Latoya Bautista, female    DOB: May 21, 1970, 51 y.o.   MRN: [id number]  Chief Complaint  Patient presents with   Shortness of Breath    Referring provider: [name], MD  HPI: 50 year old female, never smoked.  Past medical history significant for squamous cell carcinoma of lateral tongue, liver cirrhosis, thrombocytopenia.  Patient of Dr. Vaughan Browner, seen for initial consult on 04/04/2021 for dyspnea/evaluation for interstitial lung disease.    PET scan showed vague bilateral groundglass opacities which are concerning for interstitial lung disease.  She has had whole body radiation from childhood for treatment of AL L.  PFTs showed restriction with diffusion capacity defect although this was a poor quality study due to poor effort.  Patient was ordered for high-resolution CAT scan for better evaluation of the lungs.  Recommend follow-up in clinic in 3 to 4 weeks to review CT imaging and plan.  Previous [facility] pulmonary encounter: 04/04/21- Dr. Vaughan Browner  50 year old with seizure disorder, hepatitis C, cirrhosis, tongue cancer referred for evaluation of abnormal lung imaging, dyspnea  Complains of dyspnea on exertion for the past 6 months.  No cough, sputum production, wheezing  She has history of childhood ALL with bone marrow transplant x2, whole body radiation Tongue cancer with partial glossectomy on January 18, 2021 with intermittent bleeding.  Her ENT doctor is at [facility].  Recent PET scan did not show any residual cancer but there was concern for bilateral interstitial infiltrates suggestive of interstitial lung disease.  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  No feather pillows or comforter Smoking history: Never smoker Travel history: No significant travel history Relevant family history: No family history of lung disease   04/18/2021-  Interim hx  Patient presents today for 3-4 week follow-up/review HRCT. She is at her baseline. She reports having dyspnea symptoms since wearing masks, approx 1-2 years. She gets winded with minimal exertion. She has a dry cough. She works as a Chartered certified accountant and often needs to rest while vacuuming or walking down hallways. She has hx childhood cancer, received whole body radiation.   HRCT on 04/10/2021 showed appearance of the lungs compatible with interstitial lung disease, spectrum of findings considered most compatible with alternative diagnosis.  Findings are favored to reflect nonspecific interstitial pneumonia or potential chronic hypersensitivity pneumonitis.  Recommend repeat CT chest in 12 months to assess for temporal changes.  Allergies  Allergen Reactions   Simvastatin Rash   Briviact [Brivaracetam]     Elevated LFT's   Keppra [Levetiracetam]     Rash   Asparaginase Derivatives Rash   Elspar [Asparaginase] Rash   Soap Rash    Procedure prep soap    Immunization History  Administered Date(s) Administered   Influenza, Seasonal, Injecte, Preservative Fre 02/21/2011   Influenza,inj,Quad PF,6+ Mos 05/13/2012, 02/03/2021   Influenza,inj,quad, With Preservative 02/17/2017   Influenza-Unspecified 03/04/2014    Past Medical History:  Diagnosis Date   Anemia    Cancer ([place])    tongue cancer   H/O echocardiogram    a. 07/2017: echo showing EF of 55-60%, Grade 1 DD, and no significant valve abnormalities.    Hepatitis C    Hepatitis C 04/11/2011   Leukemia ([place])    Liver cirrhosis ([place]) 04/11/2011   Osteoporosis    Seizures ([place])    Shingles    Thrombocytopenia ([place]) 04/11/2011  Thyroid disease     Tobacco History: Social History   Tobacco Use  Smoking Status Never  Smokeless Tobacco Never   Counseling given: Not Answered   Outpatient Medications Prior to Visit  Medication Sig Dispense Refill   carbamazepine (TEGRETOL XR) 400 MG 12 hr tablet Take 1 tablet (400 mg  total) by mouth 2 (two) times daily. 180 tablet 3   cholecalciferol (VITAMIN D) 1000 units tablet Take 1,000 Units by mouth daily.     denosumab (PROLIA) 60 MG/ML SOSY injection Recieve injection of 60mg  every 6 months subcutaneously. To be administered by MD 1 mL 0   dexlansoprazole (DEXILANT) 60 MG capsule TAKE 1 CAPSULE BY MOUTH ONCE DAILY 90 capsule 3   estradiol (ESTRACE) 1 MG tablet Take 1 tablet (1 mg total) by mouth daily. 90 tablet 4   folic acid (FOLVITE) 1 MG tablet Take 1 tablet (1 mg total) by mouth daily. 30 tablet 4   Garlic 4970 MG CAPS Take 1 capsule by mouth daily.     icosapent Ethyl (VASCEPA) 1 g capsule Take 2 capsules (2 g total) by mouth 2 (two) times daily. 360 capsule 0   lansoprazole (PREVACID) 30 MG capsule Take 1 capsule by mouth daily 90 capsule 3   levocetirizine (XYZAL) 5 MG tablet Take by mouth.     metoprolol tartrate (LOPRESSOR) 25 MG tablet Take 1.5 tablets (37.5 mg total) by mouth 2 (two) times daily. 270 tablet 3   Multiple Vitamins-Minerals (CENTRUM PO) Take 1 tablet by mouth daily.       olmesartan (BENICAR) 20 MG tablet Take 1 tablet (20 mg total) by mouth daily. 90 tablet 0   olmesartan (BENICAR) 20 MG tablet Take by mouth.     progesterone (PROMETRIUM) 100 MG capsule Take 1 capsule (100 mg total) by mouth daily for 15 days per month. 45 capsule 5   rosuvastatin (CRESTOR) 40 MG tablet Take 1 tablet (40 mg total) by mouth at bedtime. 90 tablet 1   sodium bicarbonate 325 MG tablet Take by mouth.     SYNTHROID 75 MCG tablet Take 1 tablet (75 mcg total) by mouth daily. 90 tablet 1   vitamin B-12 (CYANOCOBALAMIN) 1000 MCG tablet Take 1,000 mcg by mouth daily.     vitamin C (ASCORBIC ACID) 500 MG tablet Take 500 mg by mouth daily.      Vitamin D, Ergocalciferol, (DRISDOL) 1.25 MG (50000 UNIT) CAPS capsule Take 1 capsule (50,000 Units total) by mouth every OTHER week as directed 20 capsule 1   vitamin E 400 UNIT capsule Take 400 Units by mouth 2 (two) times  daily.     No facility-administered medications prior to visit.    Review of Systems  Review of Systems  Constitutional: Negative.   HENT: Negative.    Respiratory:  Positive for cough and shortness of breath. Negative for chest tightness and wheezing.   Cardiovascular: Negative.     Physical Exam  BP 126/80    Pulse 99    Resp 18    Ht 4\' 11"  (1.499 m)    Wt 124 lb (56.2 kg)    SpO2 99%    BMI 25.04 kg/m  Physical Exam Constitutional:      Appearance: She is well-developed.  HENT:     Head: Normocephalic and atraumatic.     Mouth/Throat:     Comments: Deferred d/t masking Cardiovascular:     Rate and Rhythm: Normal rate and regular rhythm.     Comments: No  edema Pulmonary:     Effort: Pulmonary effort is normal.     Breath sounds: Normal breath sounds. No wheezing, rhonchi or rales.     Comments: Lungs were mostly clear. No overt rales/crackles.  Musculoskeletal:        General: Normal range of motion.     Cervical back: Normal range of motion and neck supple.  Skin:    General: Skin is warm and dry.     Comments: Band-aid on nose from bx  Neurological:     General: No focal deficit present.     Mental Status: She is alert and oriented to person, place, and time. Mental status is at baseline.  Psychiatric:        Mood and Affect: Mood normal.        Behavior: Behavior normal.        Thought Content: Thought content normal.        Judgment: Judgment normal.     Lab Results:  CBC    Component Value Date/Time   WBC 8.7 01/29/2021 1703   RBC 3.32 (L) 01/29/2021 1703   HGB 11.1 (L) 01/29/2021 1703   HCT 33.4 (L) 01/29/2021 1703   PLT 219 01/29/2021 1703   MCV 100.6 (H) 01/29/2021 1703   MCH 33.4 01/29/2021 1703   MCHC 33.2 01/29/2021 1703   RDW 12.0 01/29/2021 1703   LYMPHSABS 2.1 03/14/2020 0815   MONOABS 0.6 03/14/2020 0815   EOSABS 0.1 03/14/2020 0815   BASOSABS 0.0 03/14/2020 0815    BMET    Component Value Date/Time   NA 133 (L) 01/29/2021 1703    K 4.6 01/29/2021 1703   CL 99 01/29/2021 1703   CO2 23 01/29/2021 1703   GLUCOSE 112 (H) 01/29/2021 1703   BUN 37 (H) 01/29/2021 1703   CREATININE 0.76 01/29/2021 1703   CALCIUM 9.1 01/29/2021 1703   CALCIUM 8.2 (L) 12/19/2009 1013   GFRNONAA >60 01/29/2021 1703   GFRAA >60 09/16/2019 1105    BNP No results found for: BNP  ProBNP    Component Value Date/Time   PROBNP 347.0 (H) 12/18/2009 0145    Imaging: CT Chest High Resolution  Result Date: 04/10/2021 CLINICAL DATA:  50 year old female with history of dyspnea on exertion for the past 6 months. EXAM: CT CHEST WITHOUT CONTRAST TECHNIQUE: Multidetector CT imaging of the chest was performed following the standard protocol without intravenous contrast. High resolution imaging of the lungs, as well as inspiratory and expiratory imaging, was performed. COMPARISON:  No priors. FINDINGS: Cardiovascular: Heart size is normal. There is no significant pericardial fluid, thickening or pericardial calcification. There is aortic atherosclerosis, as well as atherosclerosis of the great vessels of the mediastinum and the coronary arteries, including calcified atherosclerotic plaque in the left main, left anterior descending and right coronary arteries. Mediastinum/Nodes: No pathologically enlarged mediastinal or hilar lymph nodes. Please note that accurate exclusion of hilar adenopathy is limited on noncontrast CT scans. Esophagus is unremarkable in appearance. No axillary lymphadenopathy. Lungs/Pleura: High-resolution images demonstrate widespread areas of ground-glass attenuation and septal thickening scattered throughout the lungs bilaterally. No scratch at minimal subpleural reticulation. No definitive craniocaudal gradient. No traction bronchiectasis or frank honeycombing. Inspiratory and expiratory imaging demonstrates some mild air trapping indicative of mild small airways disease. No acute consolidative airspace disease. No pleural effusions. No  definite suspicious appearing pulmonary nodules or masses are noted. Upper Abdomen: Unremarkable. Musculoskeletal: There are no aggressive appearing lytic or blastic lesions noted in the visualized portions of the  skeleton. IMPRESSION: 1. The appearance of the lungs is compatible with interstitial lung disease, with a spectrum of findings considered most compatible with an alternative diagnosis (not usual interstitial pneumonia) per current ATS guidelines. Findings are favored to reflect nonspecific interstitial pneumonia (NSIP), or potentially chronic hypersensitivity pneumonitis. High-resolution chest CT is recommended in 12 months to assess for temporal changes in the appearance of the lung parenchyma. 2. Aortic atherosclerosis, in addition to left main and 2 vessel coronary artery disease. Please note that although the presence of coronary artery calcium documents the presence of coronary artery disease, the severity of this disease and any potential stenosis cannot be assessed on this non-gated CT examination. Assessment for potential risk factor modification, dietary therapy or pharmacologic therapy may be warranted, if clinically indicated. Aortic Atherosclerosis (ICD10-I70.0). Electronically Signed   By: Vinnie Langton M.D.   On: 04/10/2021 15:34     Assessment & Plan:   ILD (interstitial lung disease) ([place]) - Patient has had symptoms of dyspnea for the last 6-12 months. Associated dry cough. HRCT chest 04/10/21 showed appearance of interstitial lung disease, findings favor nonspecific interstitial pneumonia or potentially chronic hypersensitivity pneumonitis. We will get serology labs and have her fill out ILD packet. We will also check simple walk test on room air to ensure she does not desaturate. We will discuss treatment options after testing. Follow-up in 3 months with Dr. Vaughan Browner or sooner if needed.    Martyn Ehrich, NP 04/18/2021

## 2021-04-18 ENCOUNTER — Ambulatory Visit (INDEPENDENT_AMBULATORY_CARE_PROVIDER_SITE_OTHER): Payer: 59 | Admitting: Primary Care

## 2021-04-18 ENCOUNTER — Other Ambulatory Visit: Payer: Self-pay

## 2021-04-18 ENCOUNTER — Encounter: Payer: Self-pay | Admitting: Primary Care

## 2021-04-18 VITALS — BP 126/80 | HR 117 | Resp 18 | Ht 59.0 in | Wt 124.0 lb

## 2021-04-18 DIAGNOSIS — Z85828 Personal history of other malignant neoplasm of skin: Secondary | ICD-10-CM | POA: Diagnosis not present

## 2021-04-18 DIAGNOSIS — J849 Interstitial pulmonary disease, unspecified: Secondary | ICD-10-CM | POA: Diagnosis not present

## 2021-04-18 DIAGNOSIS — C44612 Basal cell carcinoma of skin of right upper limb, including shoulder: Secondary | ICD-10-CM | POA: Diagnosis not present

## 2021-04-18 DIAGNOSIS — Z1283 Encounter for screening for malignant neoplasm of skin: Secondary | ICD-10-CM | POA: Diagnosis not present

## 2021-04-18 DIAGNOSIS — D225 Melanocytic nevi of trunk: Secondary | ICD-10-CM | POA: Diagnosis not present

## 2021-04-18 DIAGNOSIS — D485 Neoplasm of uncertain behavior of skin: Secondary | ICD-10-CM | POA: Diagnosis not present

## 2021-04-18 DIAGNOSIS — Z08 Encounter for follow-up examination after completed treatment for malignant neoplasm: Secondary | ICD-10-CM | POA: Diagnosis not present

## 2021-04-18 NOTE — Addendum Note (Signed)
Addended by: Fritzi Mandes D on: 04/18/2021 10:59 AM   Modules accepted: Orders

## 2021-04-18 NOTE — Patient Instructions (Addendum)
CT chest showed appearance of interstitial lung disease, findings favor nonspecific interstitial pneumonia or potentially chronic hypersensitivity pneumonitis  Treatment is identifying cause. Also often consists of steroids or other medications such as immunosuppressants or anti-fibrotic's   Please get labs today and fill out ILD packet and bring it back to [place] attn: Dr. Vaughan Browner or Eustaquio Maize NP  Orders: Simple walk test to assess for oxygen desaturation  ILD labs AND packet (ordered) HRCT in 12 months (ordered)  Follow-up: 3 months with Dr. Vaughan Browner

## 2021-04-18 NOTE — Addendum Note (Signed)
Addended by: Fritzi Mandes D on: 04/18/2021 10:56 AM   Modules accepted: Orders

## 2021-04-18 NOTE — Addendum Note (Signed)
Addended by: Dessie Coma on: 04/18/2021 10:30 AM   Modules accepted: Orders

## 2021-04-18 NOTE — Addendum Note (Signed)
Addended by: Fritzi Mandes D on: 04/18/2021 10:55 AM   Modules accepted: Orders

## 2021-04-18 NOTE — Assessment & Plan Note (Addendum)
-   Patient has had symptoms of dyspnea for the last 6-12 months. Associated dry cough. HRCT chest 04/10/21 showed appearance of interstitial lung disease, findings favor nonspecific interstitial pneumonia or potentially chronic hypersensitivity pneumonitis. We will get serology labs and have her fill out ILD packet. We will also check simple walk test on room air to ensure she does not desaturate. We will discuss treatment options after testing. Follow-up in 3 months with Dr. Vaughan Browner or sooner if needed.

## 2021-04-18 NOTE — Addendum Note (Signed)
Addended by: Suzzanne Cloud E on: 04/18/2021 10:18 AM   Modules accepted: Orders

## 2021-04-24 ENCOUNTER — Other Ambulatory Visit (HOSPITAL_COMMUNITY): Payer: Self-pay | Admitting: Hematology

## 2021-04-24 ENCOUNTER — Other Ambulatory Visit (HOSPITAL_COMMUNITY): Payer: Self-pay

## 2021-04-24 DIAGNOSIS — D696 Thrombocytopenia, unspecified: Secondary | ICD-10-CM

## 2021-04-24 DIAGNOSIS — K7460 Unspecified cirrhosis of liver: Secondary | ICD-10-CM

## 2021-04-24 LAB — HYPERSENSITIVITY PNEUMONITIS
A. Pullulans Abs: POSITIVE — AB
A.Fumigatus #1 Abs: NEGATIVE
Micropolyspora faeni, IgG: NEGATIVE
Pigeon Serum Abs: POSITIVE — AB
Thermoact. Saccharii: NEGATIVE
Thermoactinomyces vulgaris, IgG: NEGATIVE

## 2021-04-24 LAB — ANTI-MPO ANTIBODIES: Anti-MPO Antibodies: <0.2 units (ref 0.0–0.9)

## 2021-04-24 LAB — ANTI-SCLERODERMA ANTIBODY: Scleroderma (Scl-70) (ENA) Antibody, IgG: <0.2 AI (ref 0.0–0.9)

## 2021-04-24 LAB — RHEUMATOID FACTOR: Rheumatoid fact SerPl-aCnc: 12.6 IU/mL (ref <14.0)

## 2021-04-24 LAB — ANTI-PR3 ANTIBODIES: Anti-PR3 Antibodies: <0.2 units (ref 0.0–0.9)

## 2021-04-24 LAB — CYCLIC CITRUL PEPTIDE ANTIBODY, IGG/IGA: Cyclic Citrullin Peptide Ab: 1 units (ref 0–19)

## 2021-04-24 LAB — SJOGREN'S SYNDROME ANTIBODS(SSA + SSB)
ENA SSA (RO) Ab: <0.2 AI (ref 0.0–0.9)
ENA SSB (LA) Ab: <0.2 AI (ref 0.0–0.9)

## 2021-04-24 MED ORDER — FOLIC ACID 1 MG PO TABS
1.0000 mg | ORAL_TABLET | Freq: Every day | ORAL | 4 refills | Status: DC
  Filled 2021-04-24: qty 90, 90d supply, fill #0
  Filled 2021-07-16: qty 60, 60d supply, fill #1

## 2021-04-25 ENCOUNTER — Telehealth: Payer: Self-pay | Admitting: Pulmonary Disease

## 2021-04-25 ENCOUNTER — Other Ambulatory Visit (HOSPITAL_COMMUNITY): Payer: Self-pay

## 2021-04-25 ENCOUNTER — Encounter: Payer: Self-pay | Admitting: Pulmonary Disease

## 2021-04-25 MED ORDER — OLMESARTAN MEDOXOMIL 20 MG PO TABS
20.0000 mg | ORAL_TABLET | Freq: Every day | ORAL | 0 refills | Status: DC
  Filled 2021-04-25: qty 90, 90d supply, fill #0

## 2021-04-25 MED ORDER — PREDNISONE 10 MG PO TABS
ORAL_TABLET | ORAL | 0 refills | Status: DC
  Filled 2021-04-25: qty 77, 28d supply, fill #0

## 2021-04-25 NOTE — Telephone Encounter (Signed)
Multiple mychart messages sent by pt which is posted below: Latoya Bautista [facility] (supporting Marshell Garfinkel, MD) 6 hours ago (6:49 AM)   Just wondering if you got the paperwork that was faxed to you on Monday. 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 Just thought you might need to know this. Latoya Bautista [facility] (supporting Marshell Garfinkel, MD) 1 hour ago (12:29 PM)   My breathing is why Dr.Zack [place] Crane referred me to this office after making a special appointment with Dr Durene Cal office  Latoya Bautista [facility] (supporting Mannam, Praveen, MD) 2 hours ago (11:15 AM)   My breathing has been like this and been trying to work but its getting worse over the past few days. Just walking from building to car gives me out but sometimes depends on what Im doing on how my breathing is but mopping and vacuuming does it really bad  Latoya Bautista [facility] (supporting Marshell Garfinkel, MD) 2 hours ago (11:10 AM)   Paperwork that [facility] [place] sent home with me on the 14th of month. Dr. Volanda Napoleon also said something about prednisone and breathing therapy and havent heard anything else about it or seen a prescription for it either .    With Dr. Vaughan Browner not being in the office, routing to [facility] for her to review. Also routing to [place] to see if paperwork was received.

## 2021-04-25 NOTE — Telephone Encounter (Signed)
HRCt showed findings compatible with ILD, alernative diagnosis fabored to reflect NSIP or hypersensitivity pneumonitis. I ordered labs (needs to be completed) and asked her to fill out ILD packet. For now I will send in prednisone 40mg  x 1 week; 30mg  x 1 week; 20mg  daily until follow-up. Needs visit with Dr. Vaughan Browner in 1 month (bring ILD packet)

## 2021-04-25 NOTE — Telephone Encounter (Signed)
Will need to call [facility] in [place] to get lab results faxed.

## 2021-04-26 NOTE — Telephone Encounter (Signed)
ILD Packet was received this morning and placed in A pod/Dr. Mannam box to be reviewed once he returns to clinic.

## 2021-04-26 NOTE — Telephone Encounter (Signed)
Saw mychart encounter from [place] where she said she received the packet. Pt has been made aware. Nothing further needed.

## 2021-04-26 NOTE — Telephone Encounter (Signed)
It sounds like what was faxed was the ILD Questionnaire Packet. Lattie Haw, please advise if you have received this on pt.

## 2021-05-02 ENCOUNTER — Other Ambulatory Visit (HOSPITAL_COMMUNITY): Payer: Self-pay | Admitting: *Deleted

## 2021-05-02 DIAGNOSIS — K7460 Unspecified cirrhosis of liver: Secondary | ICD-10-CM

## 2021-05-02 DIAGNOSIS — C021 Malignant neoplasm of border of tongue: Secondary | ICD-10-CM

## 2021-05-03 ENCOUNTER — Inpatient Hospital Stay (HOSPITAL_COMMUNITY): Payer: 59 | Attending: Hematology

## 2021-05-03 ENCOUNTER — Other Ambulatory Visit: Payer: Self-pay

## 2021-05-03 DIAGNOSIS — C021 Malignant neoplasm of border of tongue: Secondary | ICD-10-CM | POA: Diagnosis not present

## 2021-05-03 DIAGNOSIS — E871 Hypo-osmolality and hyponatremia: Secondary | ICD-10-CM | POA: Insufficient documentation

## 2021-05-03 DIAGNOSIS — K7460 Unspecified cirrhosis of liver: Secondary | ICD-10-CM

## 2021-05-03 LAB — CBC WITH DIFFERENTIAL/PLATELET
Abs Immature Granulocytes: 0.09 10*3/uL — ABNORMAL HIGH (ref 0.00–0.07)
Basophils Absolute: 0 10*3/uL (ref 0.0–0.1)
Basophils Relative: 0 %
Eosinophils Absolute: 0 10*3/uL (ref 0.0–0.5)
Eosinophils Relative: 0 %
HCT: 38.8 % (ref 36.0–46.0)
Hemoglobin: 13.3 g/dL (ref 12.0–15.0)
Immature Granulocytes: 1 %
Lymphocytes Relative: 16 %
Lymphs Abs: 1.7 10*3/uL (ref 0.7–4.0)
MCH: 33.4 pg (ref 26.0–34.0)
MCHC: 34.3 g/dL (ref 30.0–36.0)
MCV: 97.5 fL (ref 80.0–100.0)
Monocytes Absolute: 0.5 10*3/uL (ref 0.1–1.0)
Monocytes Relative: 5 %
Neutro Abs: 8.4 10*3/uL — ABNORMAL HIGH (ref 1.7–7.7)
Neutrophils Relative %: 78 %
Platelets: 183 10*3/uL (ref 150–400)
RBC: 3.98 MIL/uL (ref 3.87–5.11)
RDW: 13.2 % (ref 11.5–15.5)
WBC: 10.7 10*3/uL — ABNORMAL HIGH (ref 4.0–10.5)
nRBC: 0 % (ref 0.0–0.2)

## 2021-05-03 LAB — COMPREHENSIVE METABOLIC PANEL
ALT: 91 U/L — ABNORMAL HIGH (ref 0–44)
AST: 76 U/L — ABNORMAL HIGH (ref 15–41)
Albumin: 4 g/dL (ref 3.5–5.0)
Alkaline Phosphatase: 54 U/L (ref 38–126)
Anion gap: 9 (ref 5–15)
BUN: 35 mg/dL — ABNORMAL HIGH (ref 6–20)
CO2: 23 mmol/L (ref 22–32)
Calcium: 9.2 mg/dL (ref 8.9–10.3)
Chloride: 92 mmol/L — ABNORMAL LOW (ref 98–111)
Creatinine, Ser: 0.78 mg/dL (ref 0.44–1.00)
GFR, Estimated: >60 mL/min (ref >60)
Glucose, Bld: 140 mg/dL — ABNORMAL HIGH (ref 70–99)
Potassium: 4.8 mmol/L (ref 3.5–5.1)
Sodium: 124 mmol/L — ABNORMAL LOW (ref 135–145)
Total Bilirubin: 0.4 mg/dL (ref 0.3–1.2)
Total Protein: 7.8 g/dL (ref 6.5–8.1)

## 2021-05-03 LAB — VITAMIN B12: Vitamin B-12: 2736 pg/mL — ABNORMAL HIGH (ref 180–914)

## 2021-05-03 LAB — VITAMIN D 25 HYDROXY (VIT D DEFICIENCY, FRACTURES): Vit D, 25-Hydroxy: 66.38 ng/mL (ref 30–100)

## 2021-05-06 HISTORY — PX: MOHS SURGERY: SUR867

## 2021-05-08 DIAGNOSIS — N6320 Unspecified lump in the left breast, unspecified quadrant: Secondary | ICD-10-CM | POA: Diagnosis not present

## 2021-05-08 DIAGNOSIS — C021 Malignant neoplasm of border of tongue: Secondary | ICD-10-CM | POA: Diagnosis not present

## 2021-05-08 DIAGNOSIS — G40109 Localization-related (focal) (partial) symptomatic epilepsy and epileptic syndromes with simple partial seizures, not intractable, without status epilepticus: Secondary | ICD-10-CM | POA: Diagnosis not present

## 2021-05-08 DIAGNOSIS — I10 Essential (primary) hypertension: Secondary | ICD-10-CM | POA: Diagnosis not present

## 2021-05-08 DIAGNOSIS — Z79899 Other long term (current) drug therapy: Secondary | ICD-10-CM | POA: Diagnosis not present

## 2021-05-08 DIAGNOSIS — E039 Hypothyroidism, unspecified: Secondary | ICD-10-CM | POA: Diagnosis not present

## 2021-05-08 DIAGNOSIS — E7800 Pure hypercholesterolemia, unspecified: Secondary | ICD-10-CM | POA: Diagnosis not present

## 2021-05-08 DIAGNOSIS — M8580 Other specified disorders of bone density and structure, unspecified site: Secondary | ICD-10-CM | POA: Diagnosis not present

## 2021-05-08 DIAGNOSIS — E871 Hypo-osmolality and hyponatremia: Secondary | ICD-10-CM | POA: Diagnosis not present

## 2021-05-08 DIAGNOSIS — C029 Malignant neoplasm of tongue, unspecified: Secondary | ICD-10-CM | POA: Diagnosis not present

## 2021-05-08 DIAGNOSIS — C760 Malignant neoplasm of head, face and neck: Secondary | ICD-10-CM | POA: Diagnosis not present

## 2021-05-08 DIAGNOSIS — Z888 Allergy status to other drugs, medicaments and biological substances status: Secondary | ICD-10-CM | POA: Diagnosis not present

## 2021-05-09 ENCOUNTER — Other Ambulatory Visit (HOSPITAL_COMMUNITY): Payer: Self-pay | Admitting: Adult Health Nurse Practitioner

## 2021-05-09 NOTE — Progress Notes (Signed)
Latoya Bautista, Latoya Bautista   CLINIC:  Medical Oncology/Hematology  PCP:  [name], MD [address] Latoya Bautista [place] [address] [phone]   REASON FOR VISIT:  Follow-up for invasive squamous cell carcinoma of tongue  PRIOR THERAPY: none  NGS Results: not done  CURRENT THERAPY: under work-up  BRIEF ONCOLOGIC HISTORY:  Oncology History   No history exists.    CANCER STAGING:  Cancer Staging  Squamous cell carcinoma of lateral tongue ([place]) Staging form: Oral Cavity, AJCC 8th Edition - Clinical stage from 12/12/2020: Stage I (cT1, cN0, cM0) - Unsigned   INTERVAL HISTORY:  Latoya Bautista, a 51 y.o. female, returns for routine follow-up of her invasive squamous cell carcinoma of tongue. Latoya Bautista was last seen on 12/12/2020.   Today she reports feeling good. She reports a basal cell skin cancer on her nose which is scheduled to be removed. She has been taking carbamazepine for about 30 years for seizures. She denies difficulty eating.   REVIEW OF SYSTEMS:  Review of Systems  Constitutional:  Negative for appetite change and fatigue.  HENT:   Negative for trouble swallowing.   Respiratory:  Positive for cough and shortness of breath.   All other systems reviewed and are negative.  PAST MEDICAL/SURGICAL HISTORY:  Past Medical History:  Diagnosis Date   Anemia    Cancer ([place])    tongue cancer   H/O echocardiogram    a. 07/2017: echo showing EF of 55-60%, Grade 1 DD, and no significant valve abnormalities.    Hepatitis C    Hepatitis C 04/11/2011   Leukemia ([place])    Liver cirrhosis ([place]) 04/11/2011   Osteoporosis    Seizures ([facility])    Shingles    Thrombocytopenia ([place]) 04/11/2011   Thyroid disease    Past Surgical History:  Procedure Laterality Date   BONE MARROW BIOPSY  05/07/2011   BONE MARROW TRANSPLANT     BUNIONECTOMY     CHOLECYSTECTOMY     liver biopsie     TONGUE BIOPSY     tongue cancer     surgical  removal of area    SOCIAL HISTORY:  Social History   Socioeconomic History   Marital status: Married    Spouse name: Latoya Bautista   Number of children: 0   Years of education: 12th   Highest education level: Not on file  Occupational History    Employer: [employer]  Tobacco Use   Smoking status: Never   Smokeless tobacco: Never  Vaping Use   Vaping Use: Never used  Substance and Sexual Activity   Alcohol use: No   Drug use: No   Sexual activity: Not Currently    Birth control/protection: None, Post-menopausal  Other Topics Concern   Not on file  Social History Narrative   Patient lives at home with her spouse.   Caffeine Use: 16oz bottle of soda daily   Social Determinants of Health   Financial Resource Strain: Low Risk    Difficulty of Paying Living Expenses: Not hard at all  Food Insecurity: No Food Insecurity   Worried About Charity fundraiser in the Last Year: Never true   Ran Out of Food in the Last Year: Never true  Transportation Needs: No Transportation Needs   Lack of Transportation (Medical): No   Lack of Transportation (Non-Medical): No  Physical Activity: Inactive   Days of Exercise per Week: 0 days  Minutes of Exercise per Session: 0 min  Stress: No Stress Concern Present   Feeling of Stress : Not at all  Social Connections: Socially Integrated   Frequency of Communication with Friends and Family: More than three times a week   Frequency of Social Gatherings with Friends and Family: Three times a week   Attends Religious Services: More than 4 times per year   Active Member of Clubs or Organizations: Yes   Attends Archivist Meetings: 1 to 4 times per year   Marital Status: Married  Human resources officer Violence: Not At Risk   Fear of Current or Ex-Partner: No   Emotionally Abused: No   Physically Abused: No   Sexually Abused: No    FAMILY HISTORY:  Family History  Problem Relation Age of Onset   Hypertension Father     Diabetes Maternal Uncle     CURRENT MEDICATIONS:  Current Outpatient Medications  Medication Sig Dispense Refill   carbamazepine (TEGRETOL XR) 400 MG 12 hr tablet Take 1 tablet (400 mg total) by mouth 2 (two) times daily. 180 tablet 3   cholecalciferol (VITAMIN D) 1000 units tablet Take 1,000 Units by mouth daily.     denosumab (PROLIA) 60 MG/ML SOSY injection Recieve injection of $RemoveBefo'60mg'USNQtsNTLFL$  every 6 months subcutaneously. To be administered by MD 1 mL 0   dexlansoprazole (DEXILANT) 60 MG capsule TAKE 1 CAPSULE BY MOUTH ONCE DAILY 90 capsule 3   estradiol (ESTRACE) 1 MG tablet Take 1 tablet (1 mg total) by mouth daily. 90 tablet 4   folic acid (FOLVITE) 1 MG tablet Take 1 tablet (1 mg total) by mouth daily. 30 tablet 4   Garlic 0350 MG CAPS Take 1 capsule by mouth daily.     icosapent Ethyl (VASCEPA) 1 g capsule Take 2 capsules (2 g total) by mouth 2 (two) times daily. 360 capsule 0   lansoprazole (PREVACID) 30 MG capsule Take 1 capsule by mouth daily 90 capsule 3   levocetirizine (XYZAL) 5 MG tablet Take by mouth.     metoprolol tartrate (LOPRESSOR) 25 MG tablet Take 1.5 tablets (37.5 mg total) by mouth 2 (two) times daily. 270 tablet 3   Multiple Vitamins-Minerals (CENTRUM PO) Take 1 tablet by mouth daily.       olmesartan (BENICAR) 20 MG tablet Take by mouth.     olmesartan (BENICAR) 20 MG tablet Take 1 tablet (20 mg total) by mouth daily. 90 tablet 0   predniSONE (DELTASONE) 10 MG tablet Take 4 tablets  by mouth daily with breakfast for 7 days, THEN 3 tablets  daily with breakfast for 7 days, THEN 2 tablets  daily with breakfast for 14 days. 77 tablet 0   progesterone (PROMETRIUM) 100 MG capsule Take 1 capsule (100 mg total) by mouth daily for 15 days per month. 45 capsule 5   rosuvastatin (CRESTOR) 40 MG tablet Take 1 tablet (40 mg total) by mouth at bedtime. 90 tablet 1   sodium bicarbonate 325 MG tablet Take by mouth.     SYNTHROID 75 MCG tablet Take 1 tablet (75 mcg total) by mouth daily. 90  tablet 1   vitamin B-12 (CYANOCOBALAMIN) 1000 MCG tablet Take 1,000 mcg by mouth daily.     vitamin C (ASCORBIC ACID) 500 MG tablet Take 500 mg by mouth daily.      Vitamin D, Ergocalciferol, (DRISDOL) 1.25 MG (50000 UNIT) CAPS capsule Take 1 capsule (50,000 Units total) by mouth every OTHER week as directed 20 capsule 1  vitamin E 400 UNIT capsule Take 400 Units by mouth 2 (two) times daily.     No current facility-administered medications for this visit.    ALLERGIES:  Allergies  Allergen Reactions   Simvastatin Rash   Briviact [Brivaracetam]     Elevated LFT's   Keppra [Levetiracetam]     Rash   Asparaginase Derivatives Rash   Elspar [Asparaginase] Rash   Soap Rash    Procedure prep soap    PHYSICAL EXAM:  Performance status (ECOG): 0 - Asymptomatic  Vitals:   05/10/21 1515  BP: 134/74  Pulse: (!) 104  Resp: 16  Temp: 98.2 F (36.8 C)  SpO2: 99%   Wt Readings from Last 3 Encounters:  05/10/21 128 lb 6.4 oz (58.2 kg)  04/18/21 124 lb (56.2 kg)  04/04/21 123 lb 6.4 oz (56 kg)   Physical Exam Vitals reviewed.  Constitutional:      Appearance: Normal appearance.  HENT:     Mouth/Throat:     Mouth: No oral lesions.  Cardiovascular:     Rate and Rhythm: Normal rate and regular rhythm.     Pulses: Normal pulses.     Heart sounds: Normal heart sounds.  Pulmonary:     Effort: Pulmonary effort is normal.     Breath sounds: Normal breath sounds.  Lymphadenopathy:     Cervical: No cervical adenopathy.     Right cervical: No superficial, deep or posterior cervical adenopathy.    Left cervical: No superficial, deep or posterior cervical adenopathy.     Upper Body:     Right upper body: No supraclavicular, axillary or pectoral adenopathy.     Left upper body: No supraclavicular, axillary or pectoral adenopathy.  Neurological:     General: No focal deficit present.     Mental Status: She is alert and oriented to person, place, and time.  Psychiatric:        Mood  and Affect: Mood normal.        Behavior: Behavior normal.     LABORATORY DATA:  I have reviewed the labs as listed.  CBC Latest Ref Rng & Units 05/03/2021 01/29/2021 03/14/2020  WBC 4.0 - 10.5 K/uL 10.7(H) 8.7 6.8  Hemoglobin 12.0 - 15.0 g/dL 13.3 11.1(L) 13.8  Hematocrit 36.0 - 46.0 % 38.8 33.4(L) 40.7  Platelets 150 - 400 K/uL 183 219 169   CMP Latest Ref Rng & Units 05/03/2021 01/29/2021 03/14/2020  Glucose 70 - 99 mg/dL 140(H) 112(H) 120(H)  BUN 6 - 20 mg/dL 35(H) 37(H) 24(H)  Creatinine 0.44 - 1.00 mg/dL 0.78 0.76 0.71  Sodium 135 - 145 mmol/L 124(L) 133(L) 133(L)  Potassium 3.5 - 5.1 mmol/L 4.8 4.6 4.3  Chloride 98 - 111 mmol/L 92(L) 99 99  CO2 22 - 32 mmol/L $RemoveB'23 23 22  'vsPrSFSE$ Calcium 8.9 - 10.3 mg/dL 9.2 9.1 9.5  Total Protein 6.5 - 8.1 g/dL 7.8 6.9 7.6  Total Bilirubin 0.3 - 1.2 mg/dL 0.4 0.2(L) 0.5  Alkaline Phos 38 - 126 U/L 54 63 62  AST 15 - 41 U/L 76(H) 46(H) 38  ALT 0 - 44 U/L 91(H) 52(H) 39    DIAGNOSTIC IMAGING:  I have independently reviewed the scans and discussed with the patient. No results found.   ASSESSMENT:  1.  Recurrent superficial invasive squamous cell carcinoma of the right lateral tongue: - Patient reports that she has been having tongue biopsy for multiple areas of dysplasia on her right tongue in 2005, 2007, 2012.  Results have been hyperkeratosis  with mild to moderate epithelial dysplasia until July 2021. - Biopsy on 12/02/2019 one of the right tongue showed superficial invasive squamous cell carcinoma, p16 negative, margins negative. - She was recently seen by Dr. Luvenia Heller and underwent right lateral tongue biopsy on 11/16/2020.  The new lesion was more anterior on the right side of the tongue to the previous lesion resected in July 2021.  It was about 5 mm in diameter, raised, red/white and firm. - Pathology was consistent with superficial invasive squamous cell carcinoma, with positive lateral margins and negative deep margin.  P16 is pending.  2.  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  She lives at home with her husband.  No children. - She was diagnosed with ALL around the age of 2 years. - She underwent whole-body radiation with stem cell transplantation at ages 8 and 51 at [facility]. - She also had left leg skin lesion resected in June 2018, superficial nodular basal cell cancer. - She had melanoma in situ on vulvar biopsy on 07/16/2018. - She was never smoker. - No family history of malignancies. - Patient is followed at [facility] hepatology (treated for hep C acquired during transplant) and seizure clinics.   PLAN:  1.  Recurrent superficial invasive squamous cell carcinoma of the right lateral tongue: - She underwent right lateral glossectomy in September 2022 by Dr. Conley Canal at [facility]. - She has recovered very well from surgery.  Physical examination today showed findings of right glossectomy with no masses.  No lymphadenopathy. - She reportedly has basal cell carcinoma of the nose, planning to undergo Mohs surgery. - Labs from 05/03/2021 showed mildly elevated AST and ALT.  CBC was grossly normal. - We have reviewed PET scan from 05/08/2021.  No evidence of recurrence.  However nodular soft tissue in the right breast 1.6 cm with mild uptake.  Subcentimeter mild avid nodule in the left breast which is indeterminate. - She had last mammogram in August 2022, BI-RADS Category 1. - Because of the new PET scan findings, I have recommended diagnostic mammogram of both breasts. - RTC 6 months for follow-up.  2.  Hyponatremia: - Sodium is 124.  She has chronic hyponatremia. - Review of medications reveals carbamazepine which can cause hyponatremia. - We will check TSH today along with serum osmolality.  She is not symptomatic.   Orders placed this encounter:  No orders of the defined types were placed in this encounter.    Derek Jack, MD [place] [phone]   I,  Thana Ates, am acting as a scribe for Dr. Derek Jack.  I, Derek Jack MD, have reviewed the above documentation for accuracy and completeness, and I agree with the above.

## 2021-05-10 ENCOUNTER — Inpatient Hospital Stay (HOSPITAL_COMMUNITY): Payer: 59

## 2021-05-10 ENCOUNTER — Other Ambulatory Visit: Payer: Self-pay

## 2021-05-10 ENCOUNTER — Encounter (HOSPITAL_COMMUNITY): Payer: Self-pay | Admitting: Hematology

## 2021-05-10 ENCOUNTER — Inpatient Hospital Stay (HOSPITAL_COMMUNITY): Payer: 59 | Attending: Hematology | Admitting: Hematology

## 2021-05-10 ENCOUNTER — Other Ambulatory Visit (HOSPITAL_COMMUNITY): Payer: Self-pay | Admitting: Hematology

## 2021-05-10 VITALS — BP 134/74 | HR 104 | Temp 98.2°F | Resp 16 | Ht 59.0 in | Wt 128.4 lb

## 2021-05-10 DIAGNOSIS — C021 Malignant neoplasm of border of tongue: Secondary | ICD-10-CM | POA: Diagnosis not present

## 2021-05-10 DIAGNOSIS — E871 Hypo-osmolality and hyponatremia: Secondary | ICD-10-CM | POA: Insufficient documentation

## 2021-05-10 DIAGNOSIS — R928 Other abnormal and inconclusive findings on diagnostic imaging of breast: Secondary | ICD-10-CM

## 2021-05-10 LAB — OSMOLALITY: Osmolality: 280 mOsm/kg (ref 275–295)

## 2021-05-10 LAB — TSH: TSH: 1.359 u[IU]/mL (ref 0.350–4.500)

## 2021-05-10 NOTE — Patient Instructions (Signed)
[place] at [facility] Discharge Instructions  You were seen and examined today by Dr. Delton Coombes. He reviewed your most recent labs and everything looks okay except your sodium is low. Please keep follow up appointment as scheduled in 6 months.    Thank you for choosing [place] at [facility] to provide your oncology and hematology care.  To afford each patient quality time with our provider, please arrive at least 15 minutes before your scheduled appointment time.   If you have a lab appointment with the [facility] please come in thru the Main Entrance and check in at the main information desk.  You need to re-schedule your appointment should you arrive 10 or more minutes late.  We strive to give you quality time with our providers, and arriving late affects you and other patients whose appointments are after yours.  Also, if you no show three or more times for appointments you may be dismissed from the clinic at the providers discretion.     Again, thank you for choosing [facility]  Our hope is that these requests will decrease the amount of time that you wait before being seen by our physicians.       _____________________________________________________________  Should you have questions after your visit to [facility], please contact our office at [phone] and follow the prompts.  Our office hours are 8:00 a.m. and 4:30 p.m. Monday - Friday.  Please note that voicemails left after 4:00 p.m. may not be returned until the following business day.  We are closed weekends and major holidays.  You do have access to a nurse 24-7, just call the main number to the clinic [phone] and do not press any options, hold on the line and a nurse will answer the phone.    For prescription refill requests, have your pharmacy contact our office and allow 72 hours.    Due to Covid, you will need to wear a mask upon  entering the hospital. If you do not have a mask, a mask will be given to you at the Main Entrance upon arrival. For doctor visits, patients may have 1 support person age 56 or older with them. For treatment visits, patients can not have anyone with them due to social distancing guidelines and our immunocompromised population.

## 2021-05-14 ENCOUNTER — Encounter: Payer: Self-pay | Admitting: Pulmonary Disease

## 2021-05-15 ENCOUNTER — Other Ambulatory Visit (HOSPITAL_COMMUNITY): Payer: Self-pay

## 2021-05-15 MED ORDER — PREDNISONE 10 MG PO TABS
50.0000 mg | ORAL_TABLET | Freq: Every day | ORAL | 0 refills | Status: AC
  Filled 2021-05-15: qty 75, 15d supply, fill #0

## 2021-05-15 NOTE — Telephone Encounter (Signed)
Dr Latoya Bautista please reply to the following My Chart message:   I just wanted to let you know how hard/difficult it was trying to work today. 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 Prednisone has helped some but not much and breathing getting worse as I taper off the prednisone . Just thought you might want to know. Thank you. Latoya Bautista     Thank you

## 2021-05-15 NOTE — Telephone Encounter (Signed)
Your labs and imaging show that you are having an allergic reaction to the birds.  I would advise to limit exposures or get rid of them if you can.  I have called in additional prednisone 50 mg a day to [facility] outpatient pharmacy.  Start taking this and we will reevaluate at return visit.

## 2021-05-16 NOTE — Telephone Encounter (Signed)
Message received this afternoon.   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 Donnald Garre been off work today and have had no breathing issues so far today. Prednisone helps some at work but not much and theres no birds at work its the physical work that makes me short of breath   Also wearing masks for 8-10hrs doesnt help either especially n95 masks   Message routed to Dr. Vaughan Browner to advise

## 2021-05-17 NOTE — Telephone Encounter (Signed)
There are also allergies to mold on her blood test. I am not sure is she is getting exposed to them at work or home.  Advise to take the new prescription for prednisone and we will reassess at return visit next week

## 2021-05-21 ENCOUNTER — Other Ambulatory Visit (HOSPITAL_COMMUNITY): Payer: Self-pay

## 2021-05-22 ENCOUNTER — Other Ambulatory Visit (HOSPITAL_BASED_OUTPATIENT_CLINIC_OR_DEPARTMENT_OTHER): Payer: Self-pay

## 2021-05-22 ENCOUNTER — Encounter: Payer: Self-pay | Admitting: Pulmonary Disease

## 2021-05-22 ENCOUNTER — Other Ambulatory Visit (HOSPITAL_COMMUNITY): Payer: Self-pay

## 2021-05-22 ENCOUNTER — Ambulatory Visit: Payer: 59 | Admitting: Pulmonary Disease

## 2021-05-22 ENCOUNTER — Other Ambulatory Visit: Payer: Self-pay

## 2021-05-22 ENCOUNTER — Encounter: Payer: Self-pay | Admitting: *Deleted

## 2021-05-22 VITALS — BP 152/80 | HR 92 | Temp 98.0°F | Ht 59.0 in | Wt 128.4 lb

## 2021-05-22 DIAGNOSIS — J849 Interstitial pulmonary disease, unspecified: Secondary | ICD-10-CM | POA: Diagnosis not present

## 2021-05-22 DIAGNOSIS — Z9481 Bone marrow transplant status: Secondary | ICD-10-CM

## 2021-05-22 DIAGNOSIS — G40109 Localization-related (focal) (partial) symptomatic epilepsy and epileptic syndromes with simple partial seizures, not intractable, without status epilepticus: Secondary | ICD-10-CM

## 2021-05-22 DIAGNOSIS — K7460 Unspecified cirrhosis of liver: Secondary | ICD-10-CM | POA: Diagnosis not present

## 2021-05-22 DIAGNOSIS — D696 Thrombocytopenia, unspecified: Secondary | ICD-10-CM | POA: Diagnosis not present

## 2021-05-22 MED ORDER — LACOSAMIDE 50 MG PO TABS
ORAL_TABLET | ORAL | 0 refills | Status: DC
  Filled 2021-05-22: qty 60, 30d supply, fill #0

## 2021-05-22 MED ORDER — LACOSAMIDE 150 MG PO TABS
150.0000 mg | ORAL_TABLET | Freq: Two times a day (BID) | ORAL | 5 refills | Status: DC
  Filled 2021-05-22: qty 60, 30d supply, fill #0

## 2021-05-22 MED ORDER — SULFAMETHOXAZOLE-TRIMETHOPRIM 800-160 MG PO TABS
1.0000 | ORAL_TABLET | ORAL | 5 refills | Status: DC
  Filled 2021-05-22: qty 12, 28d supply, fill #0
  Filled 2021-06-18: qty 12, 28d supply, fill #1
  Filled 2021-07-09: qty 12, 28d supply, fill #2
  Filled 2021-07-30: qty 12, 28d supply, fill #3
  Filled 2021-08-27: qty 12, 28d supply, fill #4

## 2021-05-22 MED ORDER — CARBAMAZEPINE ER 200 MG PO TB12
ORAL_TABLET | ORAL | 0 refills | Status: DC
  Filled 2021-05-22: qty 60, 30d supply, fill #0

## 2021-05-22 NOTE — Patient Instructions (Signed)
Continue the prednisone at 50 mg As we discussed today you are having an allergic reaction to bird feather and mold.  Please inspect your home and workplace to for mold.  I am glad you got rid of the blood We will start Bactrim 3 times a week for pneumonia prophylaxis Follow-up in 1 month

## 2021-05-22 NOTE — Progress Notes (Signed)
Latoya Bautista    [id number]    1970-05-12  Primary Care Physician:Hall, Edwinna Areola, MD  Referring Physician: [name], MD [address] [name],  [place] [address]  Chief complaint:  Follow-up for hypersensitivity pneumonitis Exposure to birds and mold   HPI: 51 year old with seizure disorder, hepatitis C, cirrhosis, tongue cancer referred for evaluation of abnormal lung imaging, dyspnea  Complains of dyspnea on exertion for the past 6 months.  No cough, sputum production, wheezing  She has history of childhood ALL with bone marrow transplant x2, whole body radiation Tongue cancer with partial glossectomy on January 18, 2021 with intermittent bleeding.  Her ENT doctor is at [facility].  Recent PET scan did not show any residual cancer but there was concern for bilateral interstitial infiltrates suggestive of interstitial lung disease.  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  No feather pillows or comforter Smoking history: Never smoker Travel history: No significant travel history Relevant family history: No family history of lung disease  Interim history: Started on prednisone at 50 mg in the beginning of January for a CT scan showing changes consistent with hypersensitivity pneumonitis and hypersensitivity panel with positive for pigeon and mold  She got rid of the patient's in December 2022.  States that prednisone helped somewhat but she is very dyspneic.  She has more symptoms at work and feels that she is okay at home.  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  She does note some mold at work but states that her house is clean  Outpatient Encounter Medications as of 05/22/2021  Medication Sig   carbamazepine (TEGRETOL XR) 200 MG  12 hr tablet Take 1 tablet in the morning and 2 tabets at night for 10 days, then 1 tablet in the morning and at night for 10 days, then 1 tablet at night for 10 days and stop.   carbamazepine (TEGRETOL XR) 400 MG 12 hr tablet Take 1 tablet (400 mg total) by mouth 2 (two) times daily.   cholecalciferol (VITAMIN D) 1000 units tablet Take 1,000 Units by mouth daily.   denosumab (PROLIA) 60 MG/ML SOSY injection Recieve injection of 60mg  every 6 months subcutaneously. To be administered by MD   estradiol (ESTRACE) 1 MG tablet Take 1 tablet (1 mg total) by mouth daily.   folic acid (FOLVITE) 1 MG tablet Take 1 tablet (1 mg total) by mouth daily.   Garlic 2683 MG CAPS Take 1 capsule by mouth daily.   icosapent Ethyl (VASCEPA) 1 g capsule Take 2 capsules (2 g total) by mouth 2 (two) times daily.   lacosamide (VIMPAT) 50 MG TABS tablet Take 1 tablet (50 mg total) by mouth in the morning and at bedtime for 10 days, THEN 2 tablets (100 mg total) in the morning and at bedtime for 10 days, THEN 3 tablets (150 mg total) in the morning and at bedtime for 10 days.   Lacosamide 150 MG TABS Take 1 tablet (150 mg total) by mouth 2 (two) times daily.   lansoprazole (PREVACID) 30 MG capsule Take 1 capsule by mouth daily   levocetirizine (XYZAL) 5 MG tablet Take by mouth.   metoprolol tartrate (LOPRESSOR) 25 MG  tablet Take 1.5 tablets (37.5 mg total) by mouth 2 (two) times daily.   Multiple Vitamins-Minerals (CENTRUM PO) Take 1 tablet by mouth daily.     olmesartan (BENICAR) 20 MG tablet Take 1 tablet (20 mg total) by mouth daily.   predniSONE (DELTASONE) 10 MG tablet Take 5 tablets (50 mg total) by mouth daily with breakfast for 15 days.   progesterone (PROMETRIUM) 100 MG capsule Take 1 capsule (100 mg total) by mouth daily for 15 days per month.   rosuvastatin (CRESTOR) 40 MG tablet Take 1 tablet (40 mg total) by mouth at bedtime.   sodium bicarbonate 325 MG tablet Take by mouth.   SYNTHROID 75 MCG tablet Take 1  tablet (75 mcg total) by mouth daily.   vitamin B-12 (CYANOCOBALAMIN) 1000 MCG tablet Take 1,000 mcg by mouth daily.   vitamin C (ASCORBIC ACID) 500 MG tablet Take 500 mg by mouth daily.    Vitamin D, Ergocalciferol, (DRISDOL) 1.25 MG (50000 UNIT) CAPS capsule Take 1 capsule (50,000 Units total) by mouth every OTHER week as directed   vitamin E 400 UNIT capsule Take 400 Units by mouth 2 (two) times daily.   dexlansoprazole (DEXILANT) 60 MG capsule TAKE 1 CAPSULE BY MOUTH ONCE DAILY   [DISCONTINUED] olmesartan (BENICAR) 20 MG tablet Take by mouth.   No facility-administered encounter medications on file as of 05/22/2021.   Physical Exam: Blood pressure 124/70, pulse (!) 105, height 4\' 11"  (1.499 m), weight 123 lb 6.4 oz (56 kg), SpO2 98 %. Gen:      No acute distress HEENT:  EOMI, sclera anicteric Neck:     No masses; no thyromegaly Lungs:    Clear to auscultation bilaterally; normal respiratory effort CV:         Regular rate and rhythm; no murmurs Abd:      + bowel sounds; soft, non-tender; no palpable masses, no distension Ext:    No edema; adequate peripheral perfusion Skin:      Warm and dry; no rash Neuro: alert and oriented x 3 Psych: normal mood and affect  Data Reviewed: Imaging: PET scan 12/07/2020-emphysematous changes, bilateral groundglass opacities concerning for interstitial lung disease.  High-res CT 04/10/2021-interstitial lung disease and alternate pattern.  NSIP versus chronic hypersensitivity pneumonitis, aortic and coronary atherosclerosis. I have reviewed the images personally.  PFTs: 03/19/2021 FVC 1.01 [34%], FEV1 0.97 [41%], F/F 96, TLC 1.82 [42%], DLCO 4.42 [25%] Severe restriction, diffusion defect  Cardiac: Echocardiogram 07/28/2017 LVEF 81-85%, grade 1 diastolic dysfunction.  PA systolic pressure could not be accurately estimated  Labs: CTD serologies 05/03/2021-negative Hypersensitivity pneumonitis panel 04/30/2021-positive for A pullulans antibody and  pigeon serum antibody  Assessment:  Hypersensitivity pneumonitis Likely from patient exposure and mold exposure given positive antibodies for A pullulans and pigeon serum.  She has gotten rid of the birds in December 2022. May have ongoing mold exposure at work as she reports presence of mold in the old nursing home and her symptoms are most severe when she is at work and at home.  I will asked her to get her home inspected as well for mold Change jobs if possible Continue prednisone at 50 mg for now Start Bactrim prophylaxis as a suspect she will need to be on prolonged taper of steroids  Follow-up in 2 to 4 weeks  Plan/Recommendations: Continue steroids, start Bactrim prophylaxis Follow-up in 2 to 4 weeks  Marshell Garfinkel MD  Pulmonary and Critical Care 05/22/2021, 9:42 AM  CC: [name], MD

## 2021-05-23 ENCOUNTER — Other Ambulatory Visit (HOSPITAL_COMMUNITY): Payer: Self-pay

## 2021-05-24 ENCOUNTER — Other Ambulatory Visit (HOSPITAL_COMMUNITY): Payer: Self-pay

## 2021-05-24 NOTE — Telephone Encounter (Signed)
Dr Vaughan Browner, see recent email from the pt and please advise if you are willing to write a letter for her, thanks!  [facility] Phone Number: [phone]   When I saw Dr. Vaughan Browner on Tuesday 17th he said that I was allergic to chemicals and mold at work and told me that I needed to change jobs if possible and I mentioned disability and he said that he would help me in anyway he could. So Im wondering if he could write a note/letter so I can go on short term disability and then go to long term disability. I had a rough day today breathing even though Im taking prednisone and antibiotics yesterday was good but took both prescriptions but today just had prednisone and ss disability wont see me until I actually stop working thats why I was trying to go out through work. Please let me know what he says.  Thank you.

## 2021-05-25 ENCOUNTER — Other Ambulatory Visit (HOSPITAL_COMMUNITY): Payer: Self-pay

## 2021-05-29 ENCOUNTER — Other Ambulatory Visit (HOSPITAL_COMMUNITY): Payer: Self-pay

## 2021-05-29 NOTE — Telephone Encounter (Signed)
Received the following messages from patient:   I took my note back to my boss and he said [employer] has to use chemicals to clean and there is no limiting the use of chemicals in cleaning . So I need Dr. Vaughan Browner to write a different letter on limitations or restrictions or disability to get help with what I need to do.  Thank you, Latoya Bautista  Working weekend wasnt any better than Thursday was. Friday I had no problems with breathing but was off from work due to working weekend. I tried to give my boss the letter Dr. Vaughan Browner have me on the 17th but my boss pretty much laughed at me saying that its not that because he has worked with the chemicals and they are safe to use so hes not believing me even though I had a note from dr Vaughan Browner. [name] why I was asking if he could help me go out on disability through work. Please let me know what he says.  Thank you, Latoya Bautista   Dr. Vaughan Browner, can you please advise? Thanks!

## 2021-05-30 ENCOUNTER — Other Ambulatory Visit (HOSPITAL_COMMUNITY): Payer: Self-pay

## 2021-05-30 DIAGNOSIS — C021 Malignant neoplasm of border of tongue: Secondary | ICD-10-CM | POA: Insufficient documentation

## 2021-05-30 DIAGNOSIS — J849 Interstitial pulmonary disease, unspecified: Secondary | ICD-10-CM | POA: Diagnosis not present

## 2021-05-30 DIAGNOSIS — D485 Neoplasm of uncertain behavior of skin: Secondary | ICD-10-CM | POA: Diagnosis not present

## 2021-05-30 DIAGNOSIS — L905 Scar conditions and fibrosis of skin: Secondary | ICD-10-CM | POA: Diagnosis not present

## 2021-05-30 DIAGNOSIS — C44301 Unspecified malignant neoplasm of skin of nose: Secondary | ICD-10-CM | POA: Diagnosis not present

## 2021-05-30 DIAGNOSIS — G40909 Epilepsy, unspecified, not intractable, without status epilepticus: Secondary | ICD-10-CM | POA: Diagnosis not present

## 2021-05-31 ENCOUNTER — Other Ambulatory Visit (HOSPITAL_COMMUNITY): Payer: Self-pay

## 2021-05-31 MED ORDER — LAMOTRIGINE ER 25 MG PO TB24
ORAL_TABLET | ORAL | 0 refills | Status: DC
  Filled 2021-05-31: qty 42, 28d supply, fill #0

## 2021-06-01 ENCOUNTER — Other Ambulatory Visit (HOSPITAL_COMMUNITY): Payer: Self-pay

## 2021-06-04 ENCOUNTER — Other Ambulatory Visit (HOSPITAL_COMMUNITY): Payer: Self-pay | Admitting: Hematology

## 2021-06-04 ENCOUNTER — Ambulatory Visit
Admission: RE | Admit: 2021-06-04 | Discharge: 2021-06-04 | Disposition: A | Discharge status: Home / Self Care | Payer: Self-pay | Source: Ambulatory Visit | Attending: Hematology | Admitting: Hematology

## 2021-06-04 ENCOUNTER — Other Ambulatory Visit (HOSPITAL_COMMUNITY): Payer: Self-pay

## 2021-06-04 DIAGNOSIS — C021 Malignant neoplasm of border of tongue: Secondary | ICD-10-CM

## 2021-06-04 MED ORDER — ICOSAPENT ETHYL 1 G PO CAPS
2.0000 g | ORAL_CAPSULE | Freq: Two times a day (BID) | ORAL | 0 refills | Status: DC
  Filled 2021-06-04: qty 360, 90d supply, fill #0

## 2021-06-04 MED ORDER — PREDNISONE 50 MG PO TABS
50.0000 mg | ORAL_TABLET | Freq: Every day | ORAL | 0 refills | Status: DC
  Filled 2021-06-04: qty 30, 30d supply, fill #0

## 2021-06-04 NOTE — Telephone Encounter (Signed)
Yes.  The plan was for her to continue prednisone at 50 mg for now with no taper.  Please send in a new prescription if needed  Regarding her previous message I am happy to help with any disability paperwork if she wants to go that route

## 2021-06-04 NOTE — Telephone Encounter (Signed)
Dr. Vaughan Browner please advise on the following My Chart message:   Latoya Bautista [facility] (supporting Marshell Garfinkel, MD) 2 days ago   Dr. Vaughan Browner, Just wondering if you were wanting me to continue prednisone? Im asking because I will be out of it on Tuesday January 31 and no refills on bottle  Thank you

## 2021-06-05 ENCOUNTER — Other Ambulatory Visit: Payer: Self-pay

## 2021-06-05 ENCOUNTER — Ambulatory Visit (HOSPITAL_COMMUNITY)
Admission: RE | Admit: 2021-06-05 | Discharge: 2021-06-05 | Disposition: A | Discharge status: Home / Self Care | Payer: 59 | Source: Ambulatory Visit | Attending: Hematology | Admitting: Hematology

## 2021-06-05 ENCOUNTER — Encounter (HOSPITAL_COMMUNITY): Payer: Self-pay

## 2021-06-05 DIAGNOSIS — R922 Inconclusive mammogram: Secondary | ICD-10-CM | POA: Diagnosis not present

## 2021-06-05 DIAGNOSIS — R928 Other abnormal and inconclusive findings on diagnostic imaging of breast: Secondary | ICD-10-CM | POA: Insufficient documentation

## 2021-06-05 DIAGNOSIS — C021 Malignant neoplasm of border of tongue: Secondary | ICD-10-CM | POA: Diagnosis not present

## 2021-06-06 NOTE — Telephone Encounter (Signed)
Dr. Vaughan Browner, please see new messages sent by pt and advise.

## 2021-06-13 ENCOUNTER — Other Ambulatory Visit (HOSPITAL_COMMUNITY): Payer: Self-pay

## 2021-06-13 NOTE — Telephone Encounter (Signed)
Called and spoke with pt to see if she wanted to move appt up sooner with Dr. Vaughan Browner due to issues she has been having and she said that would be fine. Pt has been rescheduled to see Dr. Vaughan Browner 2/10. Nothing further needed.

## 2021-06-13 NOTE — Telephone Encounter (Signed)
Please advise- patient came into the office and would like a call back regarding her breathing issues.

## 2021-06-15 ENCOUNTER — Ambulatory Visit: Payer: 59 | Admitting: Pulmonary Disease

## 2021-06-15 ENCOUNTER — Other Ambulatory Visit: Payer: Self-pay

## 2021-06-15 ENCOUNTER — Encounter: Payer: Self-pay | Admitting: *Deleted

## 2021-06-15 ENCOUNTER — Encounter: Payer: Self-pay | Admitting: Pulmonary Disease

## 2021-06-15 VITALS — BP 142/70 | HR 102 | Temp 98.0°F | Ht 59.0 in | Wt 129.0 lb

## 2021-06-15 DIAGNOSIS — J849 Interstitial pulmonary disease, unspecified: Secondary | ICD-10-CM | POA: Diagnosis not present

## 2021-06-15 NOTE — Patient Instructions (Signed)
Continue the prednisone.  Reduce dose to 40 mg a day. We will start a slow taper by reducing dose by 10 mg every month Return to clinic in 2 months

## 2021-06-15 NOTE — Progress Notes (Signed)
Latoya Bautista    [id number]    Jul 02, 1970  Primary Care Physician:Hall, Edwinna Areola, MD  Referring Physician: [name], MD [address] [name],  [place] [address]  Chief complaint:  Follow-up for hypersensitivity pneumonitis Exposure to birds and mold   HPI: 51 year old with seizure disorder, hepatitis C, cirrhosis, tongue cancer referred for evaluation of abnormal lung imaging, dyspnea  Complains of dyspnea on exertion for the past 6 months.  No cough, sputum production, wheezing  She has history of childhood ALL with bone marrow transplant x2, whole body radiation Tongue cancer with partial glossectomy on January 18, 2021 with intermittent bleeding.  Her ENT doctor is at [facility].  Recent PET scan did not show any residual cancer but there was concern for bilateral interstitial infiltrates suggestive of interstitial lung disease.  Started on prednisone at 50 mg in the beginning of January 2023 for a CT scan showing changes consistent with hypersensitivity pneumonitis and hypersensitivity panel with positive for pigeon and mold  She got rid of the patient's in December 2022.   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  No feather pillows or comforter Smoking history: Never smoker Travel history: No significant travel history Relevant family history: No family history of lung disease  Interim history: Continues on prednisone at 50 mg States that her breathing is somewhat better at home but she continues to have dyspnea whenever she goes to work.  She has difficulty wearing N95 mask and is also sensitive to cleaning products she uses at work.   Outpatient Encounter Medications as of 06/15/2021  Medication Sig   carbamazepine (TEGRETOL XR) 200 MG 12 hr tablet Take 1 tablet in the morning and  2 tabets at night for 10 days, then 1 tablet in the morning and at night for 10 days, then 1 tablet at night for 10 days and stop.   carbamazepine (TEGRETOL XR) 400 MG 12 hr tablet Take 1 tablet (400 mg total) by mouth 2 (two) times daily.   cholecalciferol (VITAMIN D) 1000 units tablet Take 1,000 Units by mouth daily.   denosumab (PROLIA) 60 MG/ML SOSY injection Recieve injection of 60mg  every 6 months subcutaneously. To be administered by MD   estradiol (ESTRACE) 1 MG tablet Take 1 tablet (1 mg total) by mouth daily.   folic acid (FOLVITE) 1 MG tablet Take 1 tablet (1 mg total) by mouth daily.   Garlic 1245 MG CAPS Take 1 capsule by mouth daily.   icosapent Ethyl (VASCEPA) 1 g capsule Take 2 capsules (2 g total) by mouth 2 (two) times daily.   LamoTRIgine (LAMICTAL XR) 25 MG TB24 24 hour tablet Take 1 tablet (25 mg total) by mouth daily for 14 days, THEN 1 tablet (25 mg total) 2 (two) times daily for 14 days.   lansoprazole (PREVACID) 30 MG capsule Take 1 capsule by mouth daily   levocetirizine (XYZAL) 5 MG tablet Take by mouth.   metoprolol tartrate (LOPRESSOR) 25 MG tablet Take 1.5 tablets (37.5 mg total) by mouth 2 (two) times daily.   Multiple Vitamins-Minerals (CENTRUM PO) Take 1 tablet by mouth daily.     olmesartan (BENICAR) 20 MG tablet Take 1 tablet (20 mg total) by mouth daily.   predniSONE (DELTASONE) 50 MG tablet Take  1 tablet (50 mg total) by mouth daily with breakfast.   progesterone (PROMETRIUM) 100 MG capsule Take 1 capsule (100 mg total) by mouth daily for 15 days per month.   rosuvastatin (CRESTOR) 40 MG tablet Take 1 tablet (40 mg total) by mouth at bedtime.   sodium bicarbonate 325 MG tablet Take by mouth.   sulfamethoxazole-trimethoprim (BACTRIM DS) 800-160 MG tablet Take 1 tablet by mouth 3 (three) times a week.   SYNTHROID 75 MCG tablet Take 1 tablet (75 mcg total) by mouth daily.   vitamin B-12 (CYANOCOBALAMIN) 1000 MCG tablet Take 1,000 mcg by mouth daily.   vitamin C  (ASCORBIC ACID) 500 MG tablet Take 500 mg by mouth daily.    Vitamin D, Ergocalciferol, (DRISDOL) 1.25 MG (50000 UNIT) CAPS capsule Take 1 capsule (50,000 Units total) by mouth every OTHER week as directed   vitamin E 400 UNIT capsule Take 400 Units by mouth 2 (two) times daily.   dexlansoprazole (DEXILANT) 60 MG capsule TAKE 1 CAPSULE BY MOUTH ONCE DAILY   [DISCONTINUED] lacosamide (VIMPAT) 50 MG TABS tablet Take 1 tablet (50 mg total) by mouth in the morning and at bedtime for 10 days, THEN 2 tablets (100 mg total) in the morning and at bedtime for 10 days, THEN 3 tablets (150 mg total) in the morning and at bedtime for 10 days.   No facility-administered encounter medications on file as of 06/15/2021.   Physical Exam: Blood pressure (!) 142/70, pulse (!) 102, temperature 98 F (36.7 C), temperature source Oral, height 4\' 11"  (1.499 m), weight 129 lb (58.5 kg), SpO2 98 %. Gen:      No acute distress HEENT:  EOMI, sclera anicteric Neck:     No masses; no thyromegaly Lungs:    Clear to auscultation bilaterally; normal respiratory effort CV:         Regular rate and rhythm; no murmurs Abd:      + bowel sounds; soft, non-tender; no palpable masses, no distension Ext:    No edema; adequate peripheral perfusion Skin:      Warm and dry; no rash Neuro: alert and oriented x 3 Psych: normal mood and affect   Data Reviewed: Imaging: PET scan 12/07/2020-emphysematous changes, bilateral groundglass opacities concerning for interstitial lung disease.  High-res CT 04/10/2021-interstitial lung disease and alternate pattern.  NSIP versus chronic hypersensitivity pneumonitis, aortic and coronary atherosclerosis. I have reviewed the images personally.  PFTs: 03/19/2021 FVC 1.01 [34%], FEV1 0.97 [41%], F/F 96, TLC 1.82 [42%], DLCO 4.42 [25%] Severe restriction, diffusion defect  Cardiac: Echocardiogram 07/28/2017 LVEF 26-83%, grade 1 diastolic dysfunction.  PA systolic pressure could not be accurately  estimated  Labs: CTD serologies 05/03/2021-negative Hypersensitivity pneumonitis panel 04/30/2021-positive for A pullulans antibody and pigeon serum antibody  Assessment:  Hypersensitivity pneumonitis Likely from patient exposure and mold exposure given positive antibodies for A pullulans and pigeon serum.  She has gotten rid of the birds in December 2022. May have ongoing mold exposure at work as she reports presence of mold in the old nursing home and her symptoms are most severe when she is at work and at home.  I will asked her to get her home inspected as well for mold She is working on avoiding exposure at the job by going on disability Start slow prednisone taper.  Reduce to 40 mg and then by 10 mg every month Continue Bactrim prophylaxis  Plan/Recommendations: Slow steroid taper by 10 mg every month Continue Bactrim  Marshell Garfinkel MD [facility] 06/15/2021, 10:16 AM  CC: [name], MD

## 2021-06-18 ENCOUNTER — Other Ambulatory Visit (HOSPITAL_COMMUNITY): Payer: Self-pay

## 2021-06-18 MED ORDER — PREDNISONE 10 MG PO TABS
ORAL_TABLET | ORAL | 2 refills | Status: AC
  Filled 2021-06-18: qty 120, 30d supply, fill #0

## 2021-06-18 NOTE — Telephone Encounter (Signed)
Mychart messages sent by pt: Latoya Bautista [facility] (supporting Marshell Garfinkel, MD) 3 days ago   Ps I brought by some paperwork on Wednesday 06/13/21 for Dr. Vaughan Browner  to fill out and return to [facility] before the 20th of February and thats when my appointment got changed from 21st until today 06/15/21. Hopefully he can get them back soon and will be getting matrrex to send other paperwork to extend my leave from surgery     Latoya Bautista [facility] (supporting Marshell Garfinkel, MD) 3 days ago   The nurse Lattie Haw that I saw on 06/15/21 with Dr. Vaughan Browner asked me to let her know how much prednisone I had left of 50mg  tablets. I have 21 tablets left counting the one for tomorrow Saturday 06/16/21 which will last 28 days if I do them the way she told me to do them. Dr. Vaughan Browner mentioned about continuing Bactrim in notes but only have enough for Monday 06/18/21 and that's all  and I told him I only had enough for Monday and he didn't say anything about continuing it but seen it in notes so if I'm to continue it 3x a week I will need it refilled. Please let me know if I'm to continue it.   Thank you, Latoya Bautista     Routing to both Dr. Vaughan Browner and Lattie Haw.

## 2021-06-18 NOTE — Addendum Note (Signed)
Addended by: Elton Sin on: 06/18/2021 03:33 PM   Modules accepted: Orders

## 2021-06-19 ENCOUNTER — Other Ambulatory Visit (HOSPITAL_COMMUNITY): Payer: Self-pay

## 2021-06-19 MED ORDER — LAMOTRIGINE ER 50 MG PO TB24
150.0000 mg | ORAL_TABLET | Freq: Two times a day (BID) | ORAL | 3 refills | Status: DC
  Filled 2021-06-19: qty 540, 90d supply, fill #0
  Filled 2021-07-31 – 2021-08-02 (×2): qty 180, 30d supply, fill #0

## 2021-06-19 MED ORDER — LAMOTRIGINE ER 25 MG PO TB24
ORAL_TABLET | ORAL | 0 refills | Status: DC
  Filled 2021-06-19: qty 336, 56d supply, fill #0

## 2021-06-20 ENCOUNTER — Telehealth: Payer: Self-pay | Admitting: Pulmonary Disease

## 2021-06-20 ENCOUNTER — Other Ambulatory Visit (HOSPITAL_COMMUNITY): Payer: Self-pay

## 2021-06-21 ENCOUNTER — Other Ambulatory Visit (HOSPITAL_COMMUNITY): Payer: Self-pay

## 2021-06-21 NOTE — Telephone Encounter (Signed)
Replied to patient's mychart message regarding FMLA paperwork. Waiting for completed forms to send back to Dr. Vaughan Browner or NP.

## 2021-06-22 ENCOUNTER — Other Ambulatory Visit (HOSPITAL_COMMUNITY): Payer: Self-pay

## 2021-06-22 DIAGNOSIS — Z481 Encounter for planned postprocedural wound closure: Secondary | ICD-10-CM | POA: Diagnosis not present

## 2021-06-22 DIAGNOSIS — C44311 Basal cell carcinoma of skin of nose: Secondary | ICD-10-CM | POA: Diagnosis not present

## 2021-06-22 DIAGNOSIS — C4491 Basal cell carcinoma of skin, unspecified: Secondary | ICD-10-CM | POA: Diagnosis not present

## 2021-06-22 MED ORDER — CARBAMAZEPINE ER 200 MG PO TB12
400.0000 mg | ORAL_TABLET | Freq: Two times a day (BID) | ORAL | 0 refills | Status: DC
  Filled 2021-06-22: qty 360, 90d supply, fill #0

## 2021-06-25 ENCOUNTER — Other Ambulatory Visit (HOSPITAL_COMMUNITY): Payer: Self-pay

## 2021-06-26 ENCOUNTER — Ambulatory Visit: Payer: 59 | Admitting: Pulmonary Disease

## 2021-06-26 NOTE — Telephone Encounter (Signed)
Forms sent back to Dr. Vaughan Browner for signature

## 2021-06-27 DIAGNOSIS — E782 Mixed hyperlipidemia: Secondary | ICD-10-CM | POA: Diagnosis not present

## 2021-06-27 DIAGNOSIS — R7301 Impaired fasting glucose: Secondary | ICD-10-CM | POA: Diagnosis not present

## 2021-06-27 NOTE — Telephone Encounter (Signed)
Message received from patient this afternoon as FYI.   Thank you very much! Please let Dr. Vaughan Browner know that my surgery on my nose went well and that there was no cancer found in what was cut off my nose. I have stitches in my nose but doing well from my surgery and my breathing is doing well at home.  Thank you again! Have a blessed day! Latoya Bautista

## 2021-07-02 ENCOUNTER — Other Ambulatory Visit (HOSPITAL_COMMUNITY): Payer: Self-pay

## 2021-07-02 DIAGNOSIS — M810 Age-related osteoporosis without current pathological fracture: Secondary | ICD-10-CM | POA: Diagnosis not present

## 2021-07-02 DIAGNOSIS — K7460 Unspecified cirrhosis of liver: Secondary | ICD-10-CM | POA: Diagnosis not present

## 2021-07-02 DIAGNOSIS — I959 Hypotension, unspecified: Secondary | ICD-10-CM | POA: Diagnosis not present

## 2021-07-02 DIAGNOSIS — E782 Mixed hyperlipidemia: Secondary | ICD-10-CM | POA: Diagnosis not present

## 2021-07-02 DIAGNOSIS — E871 Hypo-osmolality and hyponatremia: Secondary | ICD-10-CM | POA: Diagnosis not present

## 2021-07-02 DIAGNOSIS — R000 Tachycardia, unspecified: Secondary | ICD-10-CM | POA: Diagnosis not present

## 2021-07-02 DIAGNOSIS — R7301 Impaired fasting glucose: Secondary | ICD-10-CM | POA: Diagnosis not present

## 2021-07-02 DIAGNOSIS — B182 Chronic viral hepatitis C: Secondary | ICD-10-CM | POA: Diagnosis not present

## 2021-07-02 DIAGNOSIS — E039 Hypothyroidism, unspecified: Secondary | ICD-10-CM | POA: Diagnosis not present

## 2021-07-02 MED ORDER — PANTOPRAZOLE SODIUM 40 MG PO TBEC
40.0000 mg | DELAYED_RELEASE_TABLET | Freq: Every day | ORAL | 1 refills | Status: AC
Start: 2021-07-02 — End: ?
  Filled 2021-07-02: qty 90, 90d supply, fill #0
  Filled 2021-09-21: qty 90, 90d supply, fill #1

## 2021-07-02 MED ORDER — ROSUVASTATIN CALCIUM 40 MG PO TABS
40.0000 mg | ORAL_TABLET | Freq: Every day | ORAL | 1 refills | Status: DC
  Filled 2021-07-02: qty 90, 90d supply, fill #0

## 2021-07-04 DIAGNOSIS — K7460 Unspecified cirrhosis of liver: Secondary | ICD-10-CM | POA: Diagnosis not present

## 2021-07-06 ENCOUNTER — Telehealth: Payer: Self-pay | Admitting: Pulmonary Disease

## 2021-07-09 ENCOUNTER — Other Ambulatory Visit (HOSPITAL_COMMUNITY): Payer: Self-pay

## 2021-07-09 NOTE — Telephone Encounter (Signed)
Routing message to [place].  ?

## 2021-07-10 ENCOUNTER — Other Ambulatory Visit (HOSPITAL_COMMUNITY): Payer: Self-pay

## 2021-07-10 NOTE — Telephone Encounter (Signed)
Caryl Pina left a voicemail on my phone and needs further clarification on patient. I am assuming in regards to the comment on the paperwork, "permanent accommodation would include patient being able to take breaks as needed when experiencing shortness of breath".  ? ?Please advise on specifics for breaks per hour/shift the patient needs. Caryl Pina can be reached at [phone] ?

## 2021-07-10 NOTE — Telephone Encounter (Signed)
Left a voicemail for Latoya Bautista stating the frequency and duration of episodes. If Latoya Bautista is needing additional times for individual breaks, Dr. Vaughan Browner will need to advise. ?

## 2021-07-16 ENCOUNTER — Other Ambulatory Visit (HOSPITAL_COMMUNITY): Payer: Self-pay

## 2021-07-17 ENCOUNTER — Other Ambulatory Visit (HOSPITAL_COMMUNITY): Payer: Self-pay

## 2021-07-17 MED ORDER — OLMESARTAN MEDOXOMIL 20 MG PO TABS
20.0000 mg | ORAL_TABLET | Freq: Every day | ORAL | 0 refills | Status: DC
  Filled 2021-07-17: qty 90, 90d supply, fill #0

## 2021-07-17 NOTE — Telephone Encounter (Signed)
Latoya Bautista called to check on the status of this. ? ?I am following up on the medical certification submitted by the provider. ? ?  ? ?Can you confirm how many estimated breaks she may need in an 8-hour shift and how long she may need to rest for each break? ? ?Please advise. [name]'s number is [phone]. ? ?

## 2021-07-20 ENCOUNTER — Other Ambulatory Visit (HOSPITAL_COMMUNITY): Payer: Self-pay

## 2021-07-20 MED ORDER — ROSUVASTATIN CALCIUM 40 MG PO TABS
40.0000 mg | ORAL_TABLET | Freq: Every evening | ORAL | 1 refills | Status: DC
  Filled 2021-07-20 – 2021-09-25 (×2): qty 90, 90d supply, fill #0

## 2021-07-20 MED ORDER — SYNTHROID 75 MCG PO TABS
75.0000 ug | ORAL_TABLET | Freq: Every day | ORAL | 1 refills | Status: DC
  Filled 2021-07-20 – 2021-11-12 (×2): qty 90, 90d supply, fill #0

## 2021-07-25 ENCOUNTER — Ambulatory Visit: Payer: 59 | Admitting: Pulmonary Disease

## 2021-07-30 ENCOUNTER — Other Ambulatory Visit (HOSPITAL_COMMUNITY): Payer: Self-pay

## 2021-07-31 ENCOUNTER — Other Ambulatory Visit (HOSPITAL_COMMUNITY): Payer: Self-pay

## 2021-08-01 NOTE — Telephone Encounter (Signed)
Fax received from [facility] for Dr. Vaughan Browner to clarify 8 hour shift breaks. ?Will follow up when Dr. Vaughan Browner returns to clinic 08/06/21. ?

## 2021-08-02 ENCOUNTER — Other Ambulatory Visit (HOSPITAL_COMMUNITY): Payer: Self-pay

## 2021-08-03 ENCOUNTER — Other Ambulatory Visit (HOSPITAL_COMMUNITY): Payer: Self-pay

## 2021-08-06 ENCOUNTER — Other Ambulatory Visit (HOSPITAL_COMMUNITY): Payer: Self-pay

## 2021-08-06 DIAGNOSIS — C021 Malignant neoplasm of border of tongue: Secondary | ICD-10-CM | POA: Diagnosis not present

## 2021-08-08 ENCOUNTER — Telehealth: Payer: Self-pay | Admitting: Pulmonary Disease

## 2021-08-08 NOTE — Telephone Encounter (Signed)
I called and got voicemail for Caryl Pina at [facility] health leave and disability phone [phone]. ?Left a voice message that the recommendation is for 1 break per 8-hour shift. ?

## 2021-08-08 NOTE — Telephone Encounter (Signed)
Noted  

## 2021-08-08 NOTE — Telephone Encounter (Signed)
Dr. Vaughan Browner signed the clarification letter from [place] [facility] and I faxed it to her attention.  Fax# [phone].   Dr. Vaughan Browner also left a voice message for Caryl Pina from the same [place].  Ph# [phone].   Recommendation is patient should have a 1 hour break in an 8 hour shift.  ?

## 2021-08-21 ENCOUNTER — Other Ambulatory Visit (HOSPITAL_COMMUNITY): Payer: Self-pay

## 2021-08-22 ENCOUNTER — Other Ambulatory Visit (HOSPITAL_COMMUNITY): Payer: Self-pay

## 2021-08-22 MED ORDER — PROLIA 60 MG/ML ~~LOC~~ SOSY
60.0000 mg | PREFILLED_SYRINGE | SUBCUTANEOUS | 0 refills | Status: DC
Start: 2021-08-22 — End: 2022-04-09
  Filled 2021-08-22 – 2021-08-23 (×3): qty 1, 180d supply, fill #0

## 2021-08-23 ENCOUNTER — Encounter: Payer: Self-pay | Admitting: Pulmonary Disease

## 2021-08-23 ENCOUNTER — Other Ambulatory Visit (HOSPITAL_COMMUNITY): Payer: Self-pay

## 2021-08-23 ENCOUNTER — Ambulatory Visit: Payer: 59 | Admitting: Pulmonary Disease

## 2021-08-23 VITALS — BP 130/76 | HR 92 | Temp 98.2°F | Ht 59.0 in | Wt 142.2 lb

## 2021-08-23 DIAGNOSIS — J849 Interstitial pulmonary disease, unspecified: Secondary | ICD-10-CM | POA: Diagnosis not present

## 2021-08-23 MED ORDER — PREDNISONE 10 MG PO TABS
ORAL_TABLET | ORAL | 0 refills | Status: AC
  Filled 2021-08-23: qty 21, 28d supply, fill #0

## 2021-08-23 NOTE — Addendum Note (Signed)
Addended by: Elton Sin on: 08/23/2021 10:42 AM ? ? Modules accepted: Orders ? ?

## 2021-08-23 NOTE — Progress Notes (Signed)
? ?      ?Latoya Bautista    [id number]    23-Sep-1970 ? ?Primary Care Physician:Bautista, Latoya Areola, MD ? ?Referring Physician: [name], MD ?[address] ?[name] ?[place],  [place] [address] ? ?Chief complaint:  ?Follow-up for hypersensitivity pneumonitis ?Exposure to birds and mold ? ? ?HPI: ?51 year old with seizure disorder, hepatitis C, cirrhosis, tongue cancer referred for evaluation of abnormal lung imaging, dyspnea ? ?Complains of dyspnea on exertion for the past 6 months.  No cough, sputum production, wheezing ? ?She has history of childhood ALL with bone marrow transplant x2, whole body radiation ?Tongue cancer with partial glossectomy on January 18, 2021 with intermittent bleeding.  Her ENT doctor is at [facility].  Recent PET scan did not show any residual cancer but there was concern for bilateral interstitial infiltrates suggestive of interstitial lung disease. ? ?Started on prednisone at 50 mg in the beginning of January 2023 for a CT scan showing changes consistent with hypersensitivity pneumonitis and hypersensitivity panel with positive for pigeon and mold ? ?She got rid of the parakeets in December 2022.  ? 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  No feather pillows or comforter ?Smoking history: Never smoker ?Travel history: No significant travel history ?Relevant family history: No family history of lung disease ? ?Interim history: ?On prednisone taper by '10mg'$  every month. Now at 20 mg.  ?States that her breathing is somewhat better at home but she continues to have dyspnea whenever she goes to work.  She has difficulty wearing N95 mask and is also sensitive to cleaning products she uses at work. ? ?She is going to start a new job and is excited to be away from exposures at work and chemicals ? ?Outpatient Encounter  Medications as of 08/23/2021  ?Medication Sig  ? cholecalciferol (VITAMIN D) 1000 units tablet Take 1,000 Units by mouth daily.  ? denosumab (PROLIA) 60 MG/ML SOSY injection Recieve injection of '60mg'$  every 6 months subcutaneously. To be administered by MD  ? estradiol (ESTRACE) 1 MG tablet Take 1 tablet (1 mg total) by mouth daily.  ? folic acid (FOLVITE) 1 MG tablet Take 1 tablet (1 mg total) by mouth daily.  ? Garlic 4854 MG CAPS Take 1 capsule by mouth daily.  ? icosapent Ethyl (VASCEPA) 1 g capsule Take 2 capsules (2 g total) by mouth 2 (two) times daily.  ? levocetirizine (XYZAL) 5 MG tablet Take by mouth.  ? metoprolol tartrate (LOPRESSOR) 25 MG tablet Take 1.5 tablets (37.5 mg total) by mouth 2 (two) times daily.  ? Multiple Vitamins-Minerals (CENTRUM PO) Take 1 tablet by mouth daily.    ? olmesartan (BENICAR) 20 MG tablet Take 1 tablet (20 mg total) by mouth daily.  ? pantoprazole (PROTONIX) 40 MG tablet Take 1 tablet (40 mg total) by mouth daily.  ? predniSONE (DELTASONE) 10 MG tablet Take 4 tablets (40 mg total) by mouth daily for 30 days, THEN 3 tablets (30 mg total) daily for 30 days, THEN 2 tablets (20 mg total) daily.  ? progesterone (PROMETRIUM) 100 MG capsule Take 1 capsule (100 mg total) by mouth daily for 15 days per month.  ? rosuvastatin (CRESTOR) 40 MG tablet Take 1 tablet (40 mg total) by mouth at bedtime.  ? sodium bicarbonate 325 MG tablet Take by  mouth.  ? sulfamethoxazole-trimethoprim (BACTRIM DS) 800-160 MG tablet Take 1 tablet by mouth 3 (three) times a week.  ? SYNTHROID 75 MCG tablet Take 1 tablet (75 mcg total) by mouth daily.  ? vitamin B-12 (CYANOCOBALAMIN) 1000 MCG tablet Take 1,000 mcg by mouth daily.  ? vitamin C (ASCORBIC ACID) 500 MG tablet Take 500 mg by mouth daily.   ? Vitamin D, Ergocalciferol, (DRISDOL) 1.25 MG (50000 UNIT) CAPS capsule Take 1 capsule (50,000 Units total) by mouth every OTHER week as directed  ? vitamin E 400 UNIT capsule Take 400 Units by mouth 2 (two)  times daily.  ? LamoTRIgine (LAMICTAL XR) 25 MG TB24 24 hour tablet Take 2 tablets (50 mg total) by mouth 2 (two) times daily for 14 days, THEN 4 tablets (100 mg total) 2 (two) times daily for 14 days, THEN 6 tablets (150 mg total) 2 (two) times daily for 14 days.  ? [DISCONTINUED] carbamazepine (TEGRETOL XR) 200 MG 12 hr tablet Take 2 tablets (400 mg total) by mouth Two (2) times a day. She will taper this after reaching optimal Lamictal dose. Please continue to supply  ? [DISCONTINUED] carbamazepine (TEGRETOL XR) 400 MG 12 hr tablet Take 1 tablet (400 mg total) by mouth 2 (two) times daily.  ? [DISCONTINUED] denosumab (PROLIA) 60 MG/ML SOSY injection Inject 60 mg into the skin every 6 months ,to be given in the office  ? [DISCONTINUED] dexlansoprazole (DEXILANT) 60 MG capsule TAKE 1 CAPSULE BY MOUTH ONCE DAILY  ? [DISCONTINUED] lacosamide (VIMPAT) 50 MG TABS tablet Take 1 tablet (50 mg total) by mouth in the morning and at bedtime for 10 days, THEN 2 tablets (100 mg total) in the morning and at bedtime for 10 days, THEN 3 tablets (150 mg total) in the morning and at bedtime for 10 days.  ? [DISCONTINUED] lamoTRIgine (LAMICTAL XR) 50 MG 24 hour tablet Take 3 tablets (150 mg total) by mouth 2 (two) times daily.  ? [DISCONTINUED] lansoprazole (PREVACID) 30 MG capsule Take 1 capsule by mouth daily  ? [DISCONTINUED] rosuvastatin (CRESTOR) 40 MG tablet Take 1 tablet (40 mg total) by mouth daily.  ? [DISCONTINUED] SYNTHROID 75 MCG tablet Take 1 tablet (75 mcg total) by mouth daily.  ? ?No facility-administered encounter medications on file as of 08/23/2021.  ? ?Physical Exam: ?Blood pressure 130/76, pulse 92, temperature 98.2 ?F (36.8 ?C), temperature source Oral, height '4\' 11"'$  (1.499 m), weight 142 lb 3.2 oz (64.5 kg), SpO2 99 %. ?Gen:      No acute distress ?HEENT:  EOMI, sclera anicteric ?Neck:     No masses; no thyromegaly ?Lungs:    Clear to auscultation bilaterally; normal respiratory effort ?CV:         Regular  rate and rhythm; no murmurs ?Abd:      + bowel sounds; soft, non-tender; no palpable masses, no distension ?Ext:    No edema; adequate peripheral perfusion ?Skin:      Warm and dry; no rash ?Neuro: alert and oriented x 3 ?Psych: normal mood and affect  ? ?Data Reviewed: ?Imaging: ?PET scan 12/07/2020-emphysematous changes, bilateral groundglass opacities concerning for interstitial lung disease. ? ?High-res CT 04/10/2021-interstitial lung disease and alternate pattern.  NSIP versus chronic hypersensitivity pneumonitis, aortic and coronary atherosclerosis. ?I have reviewed the images personally. ? ?PFTs: ?03/19/2021 ?FVC 1.01 [34%], FEV1 0.97 [41%], F/F 96, TLC 1.82 [42%], DLCO 4.42 [25%] ?Severe restriction, diffusion defect ? ?Cardiac: ?Echocardiogram 07/28/2017 ?LVEF 82-99%, grade 1 diastolic dysfunction.  PA systolic pressure  could not be accurately estimated ? ?Labs: ?CTD serologies 05/03/2021-negative ?Hypersensitivity pneumonitis panel 04/30/2021-positive for A pullulans antibody and pigeon serum antibody ? ?Assessment:  ?Hypersensitivity pneumonitis ?Likely from patient exposure and mold exposure given positive antibodies for A pullulans and pigeon serum.  She has gotten rid of the birds in December 2022. ?May have ongoing mold exposure at work as she reports presence of mold and chemicals in the old nursing home and her symptoms are most severe when she is at work and at home. ? ?I will asked her to get her home inspected as well for mold ?She is working on avoiding exposure by getting a new job.  ?Continue prednisone taper.  Reduce to 10 mg for 2 weeks then 5 mg for 2 weeks and then stop ?Continue Bactrim prophylaxis until off prednisone ?Follow-up high-res CT in 2 months and return to clinic ? ?Plan/Recommendations: ?Prednisone taper ?Follow-up CT ? ?Marshell Garfinkel MD ?[facility] ?08/23/2021, 9:19 AM ? ?CC: Latoya Squibb, MD ? ?  ?

## 2021-08-23 NOTE — Patient Instructions (Signed)
I am glad you are starting to feel better. ?Reduce prednisone to 10 mg/day for 2 weeks and then 5 mg/day for 2 weeks and then stop.  We will send in a new prescription for this ?We will get a follow-up high-resolution CT in 2 months and return to clinic after CT scan ?

## 2021-08-24 ENCOUNTER — Other Ambulatory Visit (HOSPITAL_COMMUNITY): Payer: Self-pay

## 2021-08-24 NOTE — Telephone Encounter (Signed)
Received the following message from patient:  ? ?"Need to know if Dr. Vaughan Browner wants me to continue Bactrim since he's tapering off prednisone? Please let me know asap! ?  ?Thank you and have a blessed day. ?  ?Sharlet Salina" ? ?Dr. Vaughan Browner, can you please advise on the Bactrim? Thanks!  ?

## 2021-08-27 ENCOUNTER — Other Ambulatory Visit (HOSPITAL_COMMUNITY): Payer: Self-pay

## 2021-08-27 MED ORDER — LAMOTRIGINE ER 50 MG PO TB24
150.0000 mg | ORAL_TABLET | Freq: Two times a day (BID) | ORAL | 3 refills | Status: AC
  Filled 2021-08-27: qty 540, 90d supply, fill #0

## 2021-08-28 ENCOUNTER — Other Ambulatory Visit (HOSPITAL_COMMUNITY): Payer: Self-pay

## 2021-08-28 MED ORDER — FAMOTIDINE 20 MG PO TABS
20.0000 mg | ORAL_TABLET | Freq: Two times a day (BID) | ORAL | 2 refills | Status: DC
  Filled 2021-08-28: qty 60, 30d supply, fill #0
  Filled 2021-09-20: qty 60, 30d supply, fill #1
  Filled 2021-10-12: qty 60, 30d supply, fill #2

## 2021-08-28 MED ORDER — DEXLANSOPRAZOLE 60 MG PO CPDR
60.0000 mg | DELAYED_RELEASE_CAPSULE | Freq: Every day | ORAL | 0 refills | Status: DC
  Filled 2021-08-28: qty 30, 30d supply, fill #0

## 2021-08-28 MED ORDER — ICOSAPENT ETHYL 1 G PO CAPS
2.0000 g | ORAL_CAPSULE | Freq: Two times a day (BID) | ORAL | 0 refills | Status: DC
Start: 2021-08-28 — End: 2022-02-08
  Filled 2021-08-28: qty 360, 90d supply, fill #0

## 2021-08-29 ENCOUNTER — Other Ambulatory Visit (HOSPITAL_COMMUNITY): Payer: Self-pay

## 2021-08-30 ENCOUNTER — Other Ambulatory Visit (HOSPITAL_COMMUNITY): Payer: Self-pay

## 2021-09-04 ENCOUNTER — Other Ambulatory Visit (HOSPITAL_COMMUNITY): Payer: Self-pay

## 2021-09-05 ENCOUNTER — Other Ambulatory Visit (HOSPITAL_COMMUNITY): Payer: Self-pay

## 2021-09-06 ENCOUNTER — Other Ambulatory Visit (HOSPITAL_COMMUNITY): Payer: Self-pay

## 2021-09-18 DIAGNOSIS — Z6829 Body mass index (BMI) 29.0-29.9, adult: Secondary | ICD-10-CM | POA: Diagnosis not present

## 2021-09-18 DIAGNOSIS — C021 Malignant neoplasm of border of tongue: Secondary | ICD-10-CM | POA: Diagnosis not present

## 2021-09-20 ENCOUNTER — Other Ambulatory Visit (HOSPITAL_COMMUNITY): Payer: Self-pay

## 2021-09-21 ENCOUNTER — Other Ambulatory Visit (HOSPITAL_COMMUNITY): Payer: Self-pay

## 2021-09-25 ENCOUNTER — Other Ambulatory Visit (HOSPITAL_COMMUNITY): Payer: Self-pay | Admitting: Hematology

## 2021-09-25 ENCOUNTER — Other Ambulatory Visit (HOSPITAL_COMMUNITY): Payer: Self-pay

## 2021-09-25 DIAGNOSIS — K7460 Unspecified cirrhosis of liver: Secondary | ICD-10-CM

## 2021-09-25 DIAGNOSIS — D696 Thrombocytopenia, unspecified: Secondary | ICD-10-CM

## 2021-09-25 MED ORDER — FOLIC ACID 1 MG PO TABS
1.0000 mg | ORAL_TABLET | Freq: Every day | ORAL | 4 refills | Status: DC
  Filled 2021-09-25: qty 30, 30d supply, fill #0
  Filled 2021-10-22: qty 30, 30d supply, fill #1
  Filled 2021-11-12 – 2021-11-19 (×2): qty 30, 30d supply, fill #2

## 2021-10-10 NOTE — Telephone Encounter (Signed)
Dr. Vaughan Browner, please see patient comment and advise. Thank you.

## 2021-10-12 ENCOUNTER — Other Ambulatory Visit (HOSPITAL_COMMUNITY): Payer: Self-pay

## 2021-10-15 ENCOUNTER — Other Ambulatory Visit (HOSPITAL_COMMUNITY): Payer: Self-pay

## 2021-10-16 NOTE — Telephone Encounter (Signed)
Mychart message sent by pt stating that she feels like she has developed thrush. Dr. Vaughan Browner, please advise.

## 2021-10-22 ENCOUNTER — Other Ambulatory Visit (HOSPITAL_COMMUNITY): Payer: Self-pay

## 2021-10-22 MED ORDER — OLMESARTAN MEDOXOMIL 20 MG PO TABS
20.0000 mg | ORAL_TABLET | Freq: Every day | ORAL | 0 refills | Status: DC
  Filled 2021-10-22: qty 90, 90d supply, fill #0

## 2021-10-24 ENCOUNTER — Ambulatory Visit (HOSPITAL_COMMUNITY)
Admission: RE | Admit: 2021-10-24 | Discharge: 2021-10-24 | Disposition: A | Discharge status: Home / Self Care | Payer: 59 | Source: Ambulatory Visit | Attending: Pulmonary Disease | Admitting: Pulmonary Disease

## 2021-10-24 DIAGNOSIS — J849 Interstitial pulmonary disease, unspecified: Secondary | ICD-10-CM | POA: Insufficient documentation

## 2021-10-25 ENCOUNTER — Other Ambulatory Visit (HOSPITAL_COMMUNITY): Payer: Self-pay

## 2021-10-30 ENCOUNTER — Ambulatory Visit: Payer: 59 | Admitting: Pulmonary Disease

## 2021-10-30 ENCOUNTER — Other Ambulatory Visit (HOSPITAL_COMMUNITY): Payer: Self-pay

## 2021-10-30 ENCOUNTER — Encounter: Payer: Self-pay | Admitting: Pulmonary Disease

## 2021-10-30 VITALS — BP 122/68 | HR 109 | Temp 98.2°F | Ht 59.0 in | Wt 151.6 lb

## 2021-10-30 DIAGNOSIS — J849 Interstitial pulmonary disease, unspecified: Secondary | ICD-10-CM | POA: Diagnosis not present

## 2021-10-30 MED ORDER — NYSTATIN 100000 UNIT/ML MT SUSP
5.0000 mL | Freq: Four times a day (QID) | OROMUCOSAL | 0 refills | Status: DC
  Filled 2021-10-30: qty 60, 3d supply, fill #0

## 2021-10-30 MED ORDER — FLUCONAZOLE 200 MG PO TABS
200.0000 mg | ORAL_TABLET | Freq: Every day | ORAL | 0 refills | Status: DC
  Filled 2021-10-30: qty 7, 7d supply, fill #0

## 2021-10-30 NOTE — Progress Notes (Signed)
Latoya Bautista    [id number]    Oct 13, 1970  Primary Care Physician:Hall, Edwinna Areola, MD  Referring Physician: [name], MD [address] [name],  [place] [address]  Chief complaint:  Follow-up for hypersensitivity pneumonitis Exposure to birds and mold   HPI: 51 year old with seizure disorder, hepatitis C, cirrhosis, tongue cancer referred for evaluation of abnormal lung imaging, dyspnea  Complains of dyspnea on exertion for the past 6 months.  No cough, sputum production, wheezing  She has history of childhood ALL with bone marrow transplant x2, whole body radiation Tongue cancer with partial glossectomy on January 18, 2021 with intermittent bleeding.  Her ENT doctor is at [facility].  Recent PET scan did not show any residual cancer but there was concern for bilateral interstitial infiltrates suggestive of interstitial lung disease.  Started on prednisone at 50 mg in the beginning of January 2023 for a CT scan showing changes consistent with hypersensitivity pneumonitis and hypersensitivity panel with positive for pigeon and mold  She got rid of the parakeets in December 2022.   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  No feather pillows or comforter Smoking history: Never smoker Travel history: No significant travel history Relevant family history: No family history of lung disease  Interim history: Finished prednisone taper in May 2023 Currently on disability at home.  States her breathing is stable Here for review of CT scan  She has been dealing with oral thrush for the past month and has received nystatin several times without any improvement  Outpatient Encounter Medications as of 10/30/2021  Medication Sig   cholecalciferol (VITAMIN D) 1000 units tablet Take 1,000 Units by mouth  daily.   denosumab (PROLIA) 60 MG/ML SOSY injection Inject 60 mg into the skin every 6 months ,to be given in the office   estradiol (ESTRACE) 1 MG tablet Take 1 tablet (1 mg total) by mouth daily.   famotidine (PEPCID) 20 MG tablet Take 1 tablet (20 mg total) by mouth 2 (two) times daily.   folic acid (FOLVITE) 1 MG tablet Take 1 tablet (1 mg total) by mouth daily.   Garlic 6270 MG CAPS Take 1 capsule by mouth daily.   icosapent Ethyl (VASCEPA) 1 g capsule Take 2 capsules (2 g total) by mouth 2 (two) times daily.   lamoTRIgine (LAMICTAL XR) 50 MG 24 hour tablet Take 3 tablets (150 mg total) by mouth 2 (two) times daily.   levocetirizine (XYZAL) 5 MG tablet Take by mouth.   metoprolol tartrate (LOPRESSOR) 25 MG tablet Take 1.5 tablets (37.5 mg total) by mouth 2 (two) times daily.   Multiple Vitamins-Minerals (CENTRUM PO) Take 1 tablet by mouth daily.     olmesartan (BENICAR) 20 MG tablet Take 1 tablet (20 mg total) by mouth daily.   pantoprazole (PROTONIX) 40 MG tablet Take 1 tablet (40 mg total) by mouth daily.   progesterone (PROMETRIUM) 100 MG capsule Take 1 capsule (100 mg total) by mouth daily for 15 days per month.   rosuvastatin (CRESTOR) 40 MG tablet Take 1 tablet (40 mg total) by mouth at bedtime.   sodium bicarbonate 325 MG tablet Take by mouth.   SYNTHROID 75 MCG tablet Take 1 tablet (75 mcg total) by mouth daily.   vitamin B-12 (  CYANOCOBALAMIN) 1000 MCG tablet Take 1,000 mcg by mouth daily.   vitamin C (ASCORBIC ACID) 500 MG tablet Take 500 mg by mouth daily.    Vitamin D, Ergocalciferol, (DRISDOL) 1.25 MG (50000 UNIT) CAPS capsule Take 1 capsule (50,000 Units total) by mouth every OTHER week as directed   vitamin E 400 UNIT capsule Take 400 Units by mouth 2 (two) times daily.   dexlansoprazole (DEXILANT) 60 MG capsule Take 1 capsule (60 mg total) by mouth daily.   sulfamethoxazole-trimethoprim (BACTRIM DS) 800-160 MG tablet Take 1 tablet by mouth 3 (three) times a week.    [DISCONTINUED] denosumab (PROLIA) 60 MG/ML SOSY injection Recieve injection of '60mg'$  every 6 months subcutaneously. To be administered by MD   [DISCONTINUED] lacosamide (VIMPAT) 50 MG TABS tablet Take 1 tablet (50 mg total) by mouth in the morning and at bedtime for 10 days, THEN 2 tablets (100 mg total) in the morning and at bedtime for 10 days, THEN 3 tablets (150 mg total) in the morning and at bedtime for 10 days.   No facility-administered encounter medications on file as of 10/30/2021.   Physical Exam: Blood pressure 122/68, pulse (!) 109, temperature 98.2 F (36.8 C), height '4\' 11"'$  (1.499 m), weight 151 lb 9.6 oz (68.8 kg), SpO2 96 %. Gen:      No acute distress HEENT:  EOMI, sclera anicteric Neck:     No masses; no thyromegaly Lungs:    Clear to auscultation bilaterally; normal respiratory effort CV:         Regular rate and rhythm; no murmurs Abd:      + bowel sounds; soft, non-tender; no palpable masses, no distension Ext:    No edema; adequate peripheral perfusion Skin:      Warm and dry; no rash Neuro: alert and oriented x 3 Psych: normal mood and affect   Data Reviewed: Imaging: PET scan 12/07/2020-emphysematous changes, bilateral groundglass opacities concerning for interstitial lung disease.  High-res CT 04/10/2021-interstitial lung disease and alternate pattern.  NSIP versus chronic hypersensitivity pneumonitis, aortic and coronary atherosclerosis.  High-res CT 10/26/2021-minimal progression of pulmonary fibrosis in alternate pattern. I have reviewed the images personally.  PFTs: 03/19/2021 FVC 1.01 [34%], FEV1 0.97 [41%], F/F 96, TLC 1.82 [42%], DLCO 4.42 [25%] Severe restriction, diffusion defect  Cardiac: Echocardiogram 07/28/2017 LVEF 28-78%, grade 1 diastolic dysfunction.  PA systolic pressure could not be accurately estimated  Labs: CTD serologies 05/03/2021-negative Hypersensitivity pneumonitis panel 04/30/2021-positive for A pullulans antibody and pigeon serum  antibody  Assessment:  Hypersensitivity pneumonitis Likely from patient exposure and mold exposure given positive antibodies for A pullulans and pigeon serum.  She has gotten rid of the birds in December 2022. 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  She is avoiding work exposure by staying at home.  Finished prednisone taper I reviewed the CT which is read as minimal progression but it appears largely stable on my review.  There is no more air trapping now.  We will continue to monitor closely with follow-up PFTs in 6 months  Thrush She is received several rounds of nystatin Order Diflucan for 7 days  Plan/Recommendations: PFTs in 6 months Diflucan for 7 days Continue nystatin  Marshell Garfinkel MD [facility] 10/30/2021, 1:54 PM  CC: [name], MD

## 2021-11-02 ENCOUNTER — Other Ambulatory Visit (HOSPITAL_COMMUNITY): Payer: Self-pay

## 2021-11-02 DIAGNOSIS — C021 Malignant neoplasm of border of tongue: Secondary | ICD-10-CM

## 2021-11-02 DIAGNOSIS — D696 Thrombocytopenia, unspecified: Secondary | ICD-10-CM

## 2021-11-03 DIAGNOSIS — Z419 Encounter for procedure for purposes other than remedying health state, unspecified: Secondary | ICD-10-CM | POA: Diagnosis not present

## 2021-11-07 ENCOUNTER — Inpatient Hospital Stay (HOSPITAL_COMMUNITY): Payer: 59 | Attending: Hematology

## 2021-11-07 DIAGNOSIS — C021 Malignant neoplasm of border of tongue: Secondary | ICD-10-CM

## 2021-11-07 DIAGNOSIS — D696 Thrombocytopenia, unspecified: Secondary | ICD-10-CM

## 2021-11-07 DIAGNOSIS — Z85820 Personal history of malignant melanoma of skin: Secondary | ICD-10-CM | POA: Diagnosis not present

## 2021-11-07 DIAGNOSIS — E871 Hypo-osmolality and hyponatremia: Secondary | ICD-10-CM | POA: Insufficient documentation

## 2021-11-07 DIAGNOSIS — R7989 Other specified abnormal findings of blood chemistry: Secondary | ICD-10-CM | POA: Insufficient documentation

## 2021-11-07 DIAGNOSIS — Z85810 Personal history of malignant neoplasm of tongue: Secondary | ICD-10-CM | POA: Diagnosis not present

## 2021-11-07 DIAGNOSIS — L859 Epidermal thickening, unspecified: Secondary | ICD-10-CM | POA: Diagnosis not present

## 2021-11-07 DIAGNOSIS — B370 Candidal stomatitis: Secondary | ICD-10-CM | POA: Insufficient documentation

## 2021-11-07 DIAGNOSIS — K7460 Unspecified cirrhosis of liver: Secondary | ICD-10-CM | POA: Diagnosis not present

## 2021-11-07 LAB — COMPREHENSIVE METABOLIC PANEL
ALT: 46 U/L — ABNORMAL HIGH (ref 0–44)
AST: 56 U/L — ABNORMAL HIGH (ref 15–41)
Albumin: 4.1 g/dL (ref 3.5–5.0)
Alkaline Phosphatase: 48 U/L (ref 38–126)
Anion gap: 9 (ref 5–15)
BUN: 11 mg/dL (ref 6–20)
CO2: 24 mmol/L (ref 22–32)
Calcium: 9.5 mg/dL (ref 8.9–10.3)
Chloride: 101 mmol/L (ref 98–111)
Creatinine, Ser: 0.78 mg/dL (ref 0.44–1.00)
GFR, Estimated: >60 mL/min (ref >60)
Glucose, Bld: 151 mg/dL — ABNORMAL HIGH (ref 70–99)
Potassium: 4 mmol/L (ref 3.5–5.1)
Sodium: 134 mmol/L — ABNORMAL LOW (ref 135–145)
Total Bilirubin: 0.5 mg/dL (ref 0.3–1.2)
Total Protein: 7.2 g/dL (ref 6.5–8.1)

## 2021-11-07 LAB — CBC WITH DIFFERENTIAL/PLATELET
Abs Immature Granulocytes: 0.09 10*3/uL — ABNORMAL HIGH (ref 0.00–0.07)
Basophils Absolute: 0.1 10*3/uL (ref 0.0–0.1)
Basophils Relative: 1 %
Eosinophils Absolute: 0.3 10*3/uL (ref 0.0–0.5)
Eosinophils Relative: 4 %
HCT: 36.5 % (ref 36.0–46.0)
Hemoglobin: 12.6 g/dL (ref 12.0–15.0)
Immature Granulocytes: 1 %
Lymphocytes Relative: 37 %
Lymphs Abs: 3.3 10*3/uL (ref 0.7–4.0)
MCH: 34.8 pg — ABNORMAL HIGH (ref 26.0–34.0)
MCHC: 34.5 g/dL (ref 30.0–36.0)
MCV: 100.8 fL — ABNORMAL HIGH (ref 80.0–100.0)
Monocytes Absolute: 1 10*3/uL (ref 0.1–1.0)
Monocytes Relative: 11 %
Neutro Abs: 4.1 10*3/uL (ref 1.7–7.7)
Neutrophils Relative %: 46 %
Platelets: 144 10*3/uL — ABNORMAL LOW (ref 150–400)
RBC: 3.62 MIL/uL — ABNORMAL LOW (ref 3.87–5.11)
RDW: 12.4 % (ref 11.5–15.5)
WBC: 8.8 10*3/uL (ref 4.0–10.5)
nRBC: 0 % (ref 0.0–0.2)

## 2021-11-07 LAB — TSH: TSH: 2.065 u[IU]/mL (ref 0.350–4.500)

## 2021-11-12 ENCOUNTER — Other Ambulatory Visit (HOSPITAL_COMMUNITY): Payer: Self-pay

## 2021-11-13 NOTE — Progress Notes (Signed)
Cardiology Office Note    Date:  11/26/2021   ID:  Latoya Bautista, DOB 1970-10-09, MRN [id number]   PCP:  [name], MD   [place]  Cardiologist:  Carlyle Dolly, MD   Advanced Practice Provider:  No care team member to display Electrophysiologist:  None   [phone]   No chief complaint on file.   History of Present Illness:  Latoya Bautista is a 51 y.o. female with history of palpitations with PAC's and PVC's on monitor 2019. Echo normal LVEF G1DD. She also has ILD and invasive squamous cell CA of tongue-surgery.  Last saw Dr. Harl Bowie 11/15/20 and increased palpitations so metoprolol increased 37.5 mg bid.  2 weeks ago she had chest pain that was sharp shooting and hurt to turn or move position. Lasted 24 hrs. She then was diagnosed with shingles-only lesions are on her mid upper abdomen. Sometimes she rides a stationary bike for 30 min and has chronic DOE, no chest pain. Recent CT scan shows atherosclerosis. See below for details. She has chronic sinus tachycardia with HR's usually over 100/m.    Past Medical History:  Diagnosis Date   Anemia    Cancer ([place])    tongue cancer   H/O echocardiogram    a. 07/2017: echo showing EF of 55-60%, Grade 1 DD, and no significant valve abnormalities.    Hepatitis C    Hepatitis C 04/11/2011   Leukemia ([place])    Liver cirrhosis ([place]) 04/11/2011   Osteoporosis    Seizures ([place])    Shingles    Thrombocytopenia ([place]) 04/11/2011   Thyroid disease     Past Surgical History:  Procedure Laterality Date   BONE MARROW BIOPSY  05/07/2011   BONE MARROW TRANSPLANT     BUNIONECTOMY     CHOLECYSTECTOMY     liver biopsie     TONGUE BIOPSY     tongue cancer     surgical removal of area    Current Medications: Current Meds  Medication Sig   cholecalciferol (VITAMIN D) 1000 units tablet Take 1,000 Units by mouth daily.   denosumab (PROLIA) 60 MG/ML SOSY injection Inject 60 mg into the skin every 6 months ,to  be given in the office   estradiol (ESTRACE) 1 MG tablet Take 1 tablet (1 mg total) by mouth daily.   famotidine (PEPCID) 20 MG tablet Take 1 tablet (20 mg total) by mouth 2 (two) times daily.   folic acid (FOLVITE) 1 MG tablet Take 1 tablet (1 mg total) by mouth daily.   Garlic 4431 MG CAPS Take 1 capsule by mouth daily.   icosapent Ethyl (VASCEPA) 1 g capsule Take 2 capsules (2 g total) by mouth 2 (two) times daily.   lamoTRIgine (LAMICTAL XR) 50 MG 24 hour tablet Take 3 tablets (150 mg total) by mouth 2 (two) times daily.   metoprolol tartrate (LOPRESSOR) 25 MG tablet Take 1.5 tablets (37.5 mg total) by mouth 2 (two) times daily.   Multiple Vitamins-Minerals (CENTRUM PO) Take 1 tablet by mouth daily.     olmesartan (BENICAR) 20 MG tablet Take 1 tablet (20 mg total) by mouth daily.   pantoprazole (PROTONIX) 40 MG tablet Take 1 tablet (40 mg total) by mouth daily.   progesterone (PROMETRIUM) 100 MG capsule Take 1 capsule (100 mg total) by mouth daily for 15 days per month.   rosuvastatin (CRESTOR) 40 MG tablet Take 1 tablet (40 mg total) by mouth at bedtime.  sodium bicarbonate 325 MG tablet Take by mouth.   SYNTHROID 75 MCG tablet Take 1 tablet (75 mcg total) by mouth daily.   vitamin C (ASCORBIC ACID) 500 MG tablet Take 500 mg by mouth daily.    Vitamin D, Ergocalciferol, (DRISDOL) 1.25 MG (50000 UNIT) CAPS capsule Take 1 capsule (50,000 Units total) by mouth every OTHER week as directed     Allergies:   Simvastatin, Briviact [brivaracetam], Keppra [levetiracetam], Asparaginase derivatives, Elspar [asparaginase], and Soap   Social History   Socioeconomic History   Marital status: Married    Spouse name: Shanon Brow   Number of children: 0   Years of education: 12th   Highest education level: Not on file  Occupational History    Employer: [employer]  Tobacco Use   Smoking status: Never   Smokeless tobacco: Never  Vaping Use   Vaping Use: Never used  Substance and  Sexual Activity   Alcohol use: No   Drug use: No   Sexual activity: Not Currently    Birth control/protection: None, Post-menopausal  Other Topics Concern   Not on file  Social History Narrative   Patient lives at home with her spouse.   Caffeine Use: 16oz bottle of soda daily   Social Determinants of Health   Financial Resource Strain: Low Risk  (12/27/2020)   Overall Financial Resource Strain (CARDIA)    Difficulty of Paying Living Expenses: Not hard at all  Food Insecurity: No Food Insecurity (12/27/2020)   Hunger Vital Sign    Worried About Running Out of Food in the Last Year: Never true    Ran Out of Food in the Last Year: Never true  Transportation Needs: No Transportation Needs (12/27/2020)   PRAPARE - Hydrologist (Medical): No    Lack of Transportation (Non-Medical): No  Physical Activity: Inactive (12/27/2020)   Exercise Vital Sign    Days of Exercise per Week: 0 days    Minutes of Exercise per Session: 0 min  Stress: No Stress Concern Present (12/27/2020)   [place]    Feeling of Stress : Not at all  Social Connections: [place] (12/27/2020)   Social Connection and Isolation Panel [NHANES]    Frequency of Communication with Friends and Family: More than three times a week    Frequency of Social Gatherings with Friends and Family: Three times a week    Attends Religious Services: More than 4 times per year    Active Member of Clubs or Organizations: Yes    Attends Archivist Meetings: 1 to 4 times per year    Marital Status: Married     Family History:  The patient's  family history includes Diabetes in her maternal uncle; Hypertension in her father.   ROS:   Please see the history of present illness.    ROS All other systems reviewed and are negative.   PHYSICAL EXAM:   VS:  BP 140/78 (BP Location: Left Arm, Patient Position: Sitting, Cuff Size:  Normal)   Pulse (!) 102   Ht _0  (1.499 m)   Wt 150 lb (68 kg)   SpO2 98%   BMI 30.30 kg/m   Physical Exam  GEN: Well nourished, well developed, in no acute distress  Neck: no JVD, carotid bruits, or masses Cardiac:RRR; no murmurs, rubs, or gallops  Respiratory:  clear to auscultation bilaterally, normal work of breathing GI: soft, nontender, nondistended, + BS Ext:  without cyanosis, clubbing, or edema, Good distal pulses bilaterally Skin: small lesions on upper abdomen Neuro:  Alert and Oriented x 3,  Psych: euthymic mood, full affect  Wt Readings from Last 3 Encounters:  11/26/21 150 lb (68 kg)  11/14/21 150 lb 12.8 oz (68.4 kg)  10/30/21 151 lb 9.6 oz (68.8 kg)      Studies/Labs Reviewed:   EKG:  EKG is  ordered today.  The ekg ordered today demonstrates NSR 99/m poor ant R wave progression,unchanged  Recent Labs: 11/07/2021: ALT 46; BUN 11; Creatinine, Ser 0.78; Hemoglobin 12.6; Platelets 144; Potassium 4.0; Sodium 134; TSH 2.065   Lipid Panel No results found for: "CHOL", "TRIG", "HDL", "CHOLHDL", "VLDL", "LDLCALC", "LDLDIRECT"  Additional studies/ records that were reviewed today include:   CT 10/2021 FINDINGS: Cardiovascular: Heart size is normal. There is no significant pericardial fluid, thickening or pericardial calcification. There is aortic atherosclerosis, as well as atherosclerosis of the great vessels of the mediastinum and the coronary arteries, including calcified atherosclerotic plaque in the left main, left anterior descending and right coronary arteries. Mild calcifications of the aortic valve.   Mediastinum/Nodes: No pathologically enlarged mediastinal or hilar lymph nodes. Please note that accurate exclusion of hilar adenopathy is limited on noncontrast CT scans. Esophagus is unremarkable in appearance. No axillary lymphadenopathy.   Lungs/Pleura: High-resolution images again demonstrate patchy areas of ground-glass attenuation, septal  thickening, a few scattered areas of mild subpleural reticulation, mild thickening of the peribronchovascular interstitium and some regional architectural distortion. No definitive traction bronchiectasis or honeycombing. No discernible craniocaudal gradient to these findings. Minimal progression of disease when compared to the prior study. Inspiratory and expiratory imaging is unremarkable. No acute consolidative airspace disease. No pleural effusions. No definite suspicious appearing pulmonary nodules or masses are noted.   Upper Abdomen: Diffuse low attenuation throughout the visualized hepatic parenchyma, indicative of a background of hepatic steatosis.   Musculoskeletal: There are no aggressive appearing lytic or blastic lesions noted in the visualized portions of the skeleton.   IMPRESSION: 1. Minimal progression of pulmonary fibrosis with a spectrum of findings once again considered most compatible with an alternative diagnosis (not usual interstitial pneumonia) per current ATS guidelines. 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 2. Aortic atherosclerosis, in addition to left main and two-vessel coronary artery disease. Please note that although the presence of coronary artery calcium documents the presence of coronary artery disease, the severity of this disease and any potential stenosis cannot be assessed on this non-gated CT examination. Assessment for potential risk factor modification, dietary therapy or pharmacologic therapy may be warranted, if clinically indicated. 3. There are calcifications of the aortic valve. Echocardiographic correlation for evaluation of potential valvular dysfunction may be warranted if clinically indicated. 4. Hepatic steatosis.   Aortic Atherosclerosis (ICD10-I70.0).     Electronically Signed   By: Vinnie Langton M.D.   On: 10/26/2021 08:24     07/2017 echo Study Conclusions   - Left  ventricle: The cavity size was normal. Wall thickness was   normal. Systolic function was normal. The estimated ejection   fraction was in the range of 55% to 60%. Wall motion was normal;   there were no regional wall motion abnormalities. Doppler   parameters are consistent with abnormal left ventricular   relaxation (grade 1 diastolic dysfunction). - Aortic valve: Valve area (VTI): 2.1 cm^2. Valve area (Vmax): 2.43   cm^2. Valve area (Vmean): 2.5 cm^2. - Technically adequate study.     08/2017  event monitor 14 day event monitor Min HR 71, Max HR 173, Avg HR 102 Reported symptoms correlated with sinus rhythm and sinus tachycardia. Rare PACs and PVCs No significant arrhythmias      Risk Assessment/Calculations:         ASSESSMENT:    1. Palpitations   2. Atherosclerosis of native coronary artery of native heart without angina pectoris   3. Aortic valve disease      PLAN:  In order of problems listed above:  Palpitations-event monitor PAC/PVC's, no significant arrhythmia on metoprolol 37.5 mg bid. Patient says HR generally >100/m. She monitors BP regularly so will increase 50 mg bid  Coronary atherosclerosis and aortic valve calcification on CT- recent atypical chest pian followed by shingles diagnosis. Will order Coronary CTA and echo to assess Aortic valve.  ILD/Pulmonary fibrosis followed by pulm  Shared Decision Making/Informed Consent        Medication Adjustments/Labs and Tests Ordered: Current medicines are reviewed at length with the patient today.  Concerns regarding medicines are outlined above.  Medication changes, Labs and Tests ordered today are listed in the Patient Instructions below. There are no Patient Instructions on file for this visit.   Sumner Boast, PA-C  11/26/2021 10:38 AM    [facility] [place], [place], [place]  [address] Phone: [phone]; Fax: [phone]

## 2021-11-14 ENCOUNTER — Encounter (HOSPITAL_COMMUNITY): Payer: Self-pay | Admitting: Hematology

## 2021-11-14 ENCOUNTER — Inpatient Hospital Stay (HOSPITAL_BASED_OUTPATIENT_CLINIC_OR_DEPARTMENT_OTHER): Payer: 59 | Admitting: Hematology

## 2021-11-14 VITALS — BP 130/80 | HR 92 | Temp 98.1°F | Resp 18 | Wt 150.8 lb

## 2021-11-14 DIAGNOSIS — E871 Hypo-osmolality and hyponatremia: Secondary | ICD-10-CM | POA: Diagnosis not present

## 2021-11-14 DIAGNOSIS — Z85820 Personal history of malignant melanoma of skin: Secondary | ICD-10-CM | POA: Diagnosis not present

## 2021-11-14 DIAGNOSIS — K7460 Unspecified cirrhosis of liver: Secondary | ICD-10-CM | POA: Diagnosis not present

## 2021-11-14 DIAGNOSIS — R7989 Other specified abnormal findings of blood chemistry: Secondary | ICD-10-CM | POA: Diagnosis not present

## 2021-11-14 DIAGNOSIS — C021 Malignant neoplasm of border of tongue: Secondary | ICD-10-CM

## 2021-11-14 DIAGNOSIS — Z85810 Personal history of malignant neoplasm of tongue: Secondary | ICD-10-CM | POA: Diagnosis not present

## 2021-11-14 DIAGNOSIS — L859 Epidermal thickening, unspecified: Secondary | ICD-10-CM | POA: Diagnosis not present

## 2021-11-14 DIAGNOSIS — B370 Candidal stomatitis: Secondary | ICD-10-CM | POA: Diagnosis not present

## 2021-11-14 NOTE — Progress Notes (Signed)
[facility] [address], [place] [address]   CLINIC:  Medical Oncology/Hematology  PCP:  Benita Stabile, MD [address] Laurey Morale [place] [address] [phone]   REASON FOR VISIT:  Follow-up for invasive squamous cell carcinoma of tongue  PRIOR THERAPY: none  NGS Results: not done  CURRENT THERAPY: surveillance  BRIEF ONCOLOGIC HISTORY:  Oncology History   No history exists.    CANCER STAGING:  Cancer Staging  Squamous cell carcinoma of lateral tongue (HCC) Staging form: Oral Cavity, AJCC 8th Edition - Clinical stage from 12/12/2020: Stage I (cT1, cN0, cM0) - Unsigned   INTERVAL HISTORY:  Latoya Bautista, a 51 y.o. female, returns for routine follow-up of Latoya Bautista invasive squamous cell carcinoma of tongue. Latoya Bautista was last seen on 05/10/2021.   Today Latoya Bautista reports feeling well. Latoya Bautista denies tongue pain. Latoya Bautista reports thrush on Latoya Bautista tongue due to prednisone.    REVIEW OF SYSTEMS:  Review of Systems  Respiratory:  Positive for shortness of breath.   Skin:  Positive for rash.  All other systems reviewed and are negative.   PAST MEDICAL/SURGICAL HISTORY:  Past Medical History:  Diagnosis Date   Anemia    Cancer (HCC)    tongue cancer   H/O echocardiogram    a. 07/2017: echo showing EF of 55-60%, Grade 1 DD, and no significant valve abnormalities.    Hepatitis C    Hepatitis C 04/11/2011   Leukemia (HCC)    Liver cirrhosis (HCC) 04/11/2011   Osteoporosis    Seizures (HCC)    Shingles    Thrombocytopenia (HCC) 04/11/2011   Thyroid disease    Past Surgical History:  Procedure Laterality Date   BONE MARROW BIOPSY  05/07/2011   BONE MARROW TRANSPLANT     BUNIONECTOMY     CHOLECYSTECTOMY     liver biopsie     TONGUE BIOPSY     tongue cancer     surgical removal of area    SOCIAL HISTORY:  Social History   Socioeconomic History   Marital status: Married    Spouse name: [name]   Number of children: 0   Years of education: 12th    Highest education level: Not on file  Occupational History    Employer: [employer]  Tobacco Use   Smoking status: Never   Smokeless tobacco: Never  Vaping Use   Vaping Use: Never used  Substance and Sexual Activity   Alcohol use: No   Drug use: No   Sexual activity: Not Currently    Birth control/protection: None, Post-menopausal  Other Topics Concern   Not on file  Social History Narrative   Patient lives at home with Latoya Bautista spouse.   Caffeine Use: 16oz bottle of soda daily   Social Determinants of Health   Financial Resource Strain: Low Risk  (12/27/2020)   Overall Financial Resource Strain (CARDIA)    Difficulty of Paying Living Expenses: Not hard at all  Food Insecurity: No Food Insecurity (12/27/2020)   Hunger Vital Sign    Worried About Running Out of Food in the Last Year: Never true    Ran Out of Food in the Last Year: Never true  Transportation Needs: No Transportation Needs (12/27/2020)   PRAPARE - Administrator, Civil Service (Medical): No    Lack of Transportation (Non-Medical): No  Physical Activity: Inactive (12/27/2020)   Exercise Vital Sign    Days of Exercise per Week: 0 days  Minutes of Exercise per Session: 0 min  Stress: No Stress Concern Present (12/27/2020)   [place]    Feeling of Stress : Not at all  Social Connections: [place] (12/27/2020)   Social Connection and Isolation Panel [NHANES]    Frequency of Communication with Friends and Family: More than three times a week    Frequency of Social Gatherings with Friends and Family: Three times a week    Attends Religious Services: More than 4 times per year    Active Member of Clubs or Organizations: Yes    Attends Archivist Meetings: 1 to 4 times per year    Marital Status: Married  Human resources officer Violence: Not At Risk (12/27/2020)   Humiliation, Afraid, Rape, and Kick questionnaire     Fear of Current or Ex-Partner: No    Emotionally Abused: No    Physically Abused: No    Sexually Abused: No    FAMILY HISTORY:  Family History  Problem Relation Age of Onset   Hypertension Father    Diabetes Maternal Uncle     CURRENT MEDICATIONS:  Current Outpatient Medications  Medication Sig Dispense Refill   cholecalciferol (VITAMIN D) 1000 units tablet Take 1,000 Units by mouth daily.     denosumab (PROLIA) 60 MG/ML SOSY injection Inject 60 mg into the skin every 6 months ,to be given in the office 1 mL 0   estradiol (ESTRACE) 1 MG tablet Take 1 tablet (1 mg total) by mouth daily. 90 tablet 4   famotidine (PEPCID) 20 MG tablet Take 1 tablet (20 mg total) by mouth 2 (two) times daily. 60 tablet 2   fluconazole (DIFLUCAN) 200 MG tablet Take 1 tablet (200 mg total) by mouth daily. 7 tablet 0   folic acid (FOLVITE) 1 MG tablet Take 1 tablet (1 mg total) by mouth daily. 30 tablet 4   Garlic 4166 MG CAPS Take 1 capsule by mouth daily.     icosapent Ethyl (VASCEPA) 1 g capsule Take 2 capsules (2 g total) by mouth 2 (two) times daily. 360 capsule 0   lamoTRIgine (LAMICTAL XR) 50 MG 24 hour tablet Take 3 tablets (150 mg total) by mouth 2 (two) times daily. 540 tablet 3   levocetirizine (XYZAL) 5 MG tablet Take by mouth.     metoprolol tartrate (LOPRESSOR) 25 MG tablet Take 1.5 tablets (37.5 mg total) by mouth 2 (two) times daily. 270 tablet 3   Multiple Vitamins-Minerals (CENTRUM PO) Take 1 tablet by mouth daily.       nystatin (MYCOSTATIN) 100000 UNIT/ML suspension Take 5 mLs (500,000 Units total) by mouth 4 (four) times daily. 60 mL 0   olmesartan (BENICAR) 20 MG tablet Take 1 tablet (20 mg total) by mouth daily. 90 tablet 0   pantoprazole (PROTONIX) 40 MG tablet Take 1 tablet (40 mg total) by mouth daily. 90 tablet 1   progesterone (PROMETRIUM) 100 MG capsule Take 1 capsule (100 mg total) by mouth daily for 15 days per month. 45 capsule 5   rosuvastatin (CRESTOR) 40 MG tablet Take  1 tablet (40 mg total) by mouth at bedtime. 90 tablet 1   sodium bicarbonate 325 MG tablet Take by mouth.     SYNTHROID 75 MCG tablet Take 1 tablet (75 mcg total) by mouth daily. 90 tablet 1   triamcinolone cream (KENALOG) 0.1 % Apply 1 Application topically 2 (two) times daily.     vitamin B-12 (CYANOCOBALAMIN) 1000 MCG tablet  Take 1,000 mcg by mouth daily.     vitamin C (ASCORBIC ACID) 500 MG tablet Take 500 mg by mouth daily.      Vitamin D, Ergocalciferol, (DRISDOL) 1.25 MG (50000 UNIT) CAPS capsule Take 1 capsule (50,000 Units total) by mouth every OTHER week as directed 20 capsule 1   vitamin E 400 UNIT capsule Take 400 Units by mouth 2 (two) times daily.     No current facility-administered medications for this visit.    ALLERGIES:  Allergies  Allergen Reactions   Simvastatin Rash   Briviact [Brivaracetam]     Elevated LFT's   Keppra [Levetiracetam]     Rash   Asparaginase Derivatives Rash   Elspar [Asparaginase] Rash   Soap Rash    Procedure prep soap    PHYSICAL EXAM:  Performance status (ECOG): 0 - Asymptomatic  Vitals:   11/14/21 1508  BP: 130/80  Pulse: 92  Resp: 18  Temp: 98.1 F (36.7 C)  SpO2: 98%   Wt Readings from Last 3 Encounters:  11/14/21 150 lb 12.8 oz (68.4 kg)  10/30/21 151 lb 9.6 oz (68.8 kg)  08/23/21 142 lb 3.2 oz (64.5 kg)   Physical Exam Vitals reviewed.  Constitutional:      Appearance: Normal appearance.  HENT:     Mouth/Throat:     Comments: Surgical margin on R WNL Black hairy tongue Cardiovascular:     Rate and Rhythm: Normal rate and regular rhythm.     Pulses: Normal pulses.     Heart sounds: Normal heart sounds.  Pulmonary:     Effort: Pulmonary effort is normal.     Breath sounds: Normal breath sounds.  Lymphadenopathy:     Cervical: No cervical adenopathy.     Right cervical: No superficial, deep or posterior cervical adenopathy.    Left cervical: No superficial, deep or posterior cervical adenopathy.     Upper Body:      Right upper body: No supraclavicular adenopathy.     Left upper body: No supraclavicular adenopathy.  Neurological:     General: No focal deficit present.     Mental Status: Latoya Bautista is alert and oriented to person, place, and time.  Psychiatric:        Mood and Affect: Mood normal.        Behavior: Behavior normal.      LABORATORY DATA:  I have reviewed the labs as listed.     Latest Ref Rng & Units 11/07/2021    3:34 PM 05/03/2021    3:03 PM 01/29/2021    5:03 PM  CBC  WBC 4.0 - 10.5 K/uL 8.8  10.7  8.7   Hemoglobin 12.0 - 15.0 g/dL 12.6  13.3  11.1   Hematocrit 36.0 - 46.0 % 36.5  38.8  33.4   Platelets 150 - 400 K/uL 144  183  219       Latest Ref Rng & Units 11/07/2021    3:34 PM 05/03/2021    3:03 PM 01/29/2021    5:03 PM  CMP  Glucose 70 - 99 mg/dL 151  140  112   BUN 6 - 20 mg/dL 11  35  37   Creatinine 0.44 - 1.00 mg/dL 0.78  0.78  0.76   Sodium 135 - 145 mmol/L 134  124  133   Potassium 3.5 - 5.1 mmol/L 4.0  4.8  4.6   Chloride 98 - 111 mmol/L 101  92  99   CO2 22 - 32 mmol/L 24  23  23   Calcium 8.9 - 10.3 mg/dL 9.5  9.2  9.1   Total Protein 6.5 - 8.1 g/dL 7.2  7.8  6.9   Total Bilirubin 0.3 - 1.2 mg/dL 0.5  0.4  0.2   Alkaline Phos 38 - 126 U/L 48  54  63   AST 15 - 41 U/L 56  76  46   ALT 0 - 44 U/L 46  91  52     DIAGNOSTIC IMAGING:  I have independently reviewed the scans and discussed with the patient. CT CHEST HIGH RESOLUTION  Result Date: 10/26/2021 CLINICAL DATA:  51 year old female with history of interstitial lung disease. Follow-up study. EXAM: CT CHEST WITHOUT CONTRAST TECHNIQUE: Multidetector CT imaging of the chest was performed following the standard protocol without intravenous contrast. High resolution imaging of the lungs, as well as inspiratory and expiratory imaging, was performed. RADIATION DOSE REDUCTION: This exam was performed according to the departmental dose-optimization program which includes automated exposure control, adjustment of  the mA and/or kV according to patient size and/or use of iterative reconstruction technique. COMPARISON:  Chest x-ray chest CT 04/09/2021. FINDINGS: Cardiovascular: Heart size is normal. There is no significant pericardial fluid, thickening or pericardial calcification. There is aortic atherosclerosis, as well as atherosclerosis of the great vessels of the mediastinum and the coronary arteries, including calcified atherosclerotic plaque in the left main, left anterior descending and right coronary arteries. Mild calcifications of the aortic valve. Mediastinum/Nodes: No pathologically enlarged mediastinal or hilar lymph nodes. Please note that accurate exclusion of hilar adenopathy is limited on noncontrast CT scans. Esophagus is unremarkable in appearance. No axillary lymphadenopathy. Lungs/Pleura: High-resolution images again demonstrate patchy areas of ground-glass attenuation, septal thickening, a few scattered areas of mild subpleural reticulation, mild thickening of the peribronchovascular interstitium and some regional architectural distortion. No definitive traction bronchiectasis or honeycombing. No discernible craniocaudal gradient to these findings. Minimal progression of disease when compared to the prior study. Inspiratory and expiratory imaging is unremarkable. No acute consolidative airspace disease. No pleural effusions. No definite suspicious appearing pulmonary nodules or masses are noted. Upper Abdomen: Diffuse low attenuation throughout the visualized hepatic parenchyma, indicative of a background of hepatic steatosis. Musculoskeletal: There are no aggressive appearing lytic or blastic lesions noted in the visualized portions of the skeleton. IMPRESSION: 1. Minimal progression of pulmonary fibrosis with a spectrum of findings once again considered most compatible with an alternative diagnosis (not usual interstitial pneumonia) per current ATS guidelines. Findings are once again favored to reflect  nonspecific interstitial pneumonia (NSIP). 2. Aortic atherosclerosis, in addition to left main and two-vessel coronary artery disease. Please note that although the presence of coronary artery calcium documents the presence of coronary artery disease, the severity of this disease and any potential stenosis cannot be assessed on this non-gated CT examination. Assessment for potential risk factor modification, dietary therapy or pharmacologic therapy may be warranted, if clinically indicated. 3. There are calcifications of the aortic valve. Echocardiographic correlation for evaluation of potential valvular dysfunction may be warranted if clinically indicated. 4. Hepatic steatosis. Aortic Atherosclerosis (ICD10-I70.0). Electronically Signed   By: Vinnie Langton M.D.   On: 10/26/2021 08:24     ASSESSMENT:  1.  Recurrent superficial invasive squamous cell carcinoma of the right lateral tongue: - Patient reports that Latoya Bautista has been having tongue biopsy for multiple areas of dysplasia on Latoya Bautista right tongue in 2005, 2007, 2012.  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 - Biopsy  on 12/02/2019 one of the right tongue showed superficial invasive squamous cell carcinoma, p16 negative, margins negative. - Latoya Bautista was recently seen by Dr. Luvenia Heller and underwent right lateral tongue biopsy on 11/16/2020.  The new lesion was more anterior on the right side of the tongue to the previous lesion resected in July 2021.  It was about 5 mm in diameter, raised, red/white and firm. - Pathology was consistent with superficial invasive squamous cell carcinoma, with positive lateral margins and negative deep margin.  P16 is pending. - Status post right partial glossectomy on 01/18/2021 at [facility] by Dr. Tacey Ruiz.  2.  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  Latoya Bautista lives at home with Latoya Bautista husband.  No children. - Latoya Bautista was diagnosed with ALL around the age of 2  years. - Latoya Bautista underwent whole-body radiation with stem cell transplantation at ages 65 and 3 at [facility]. - Latoya Bautista also had left leg skin lesion resected in June 2018, superficial nodular basal cell cancer. - Latoya Bautista had melanoma in situ on vulvar biopsy on 07/16/2018. - Latoya Bautista was never smoker. - No family history of malignancies. - Patient is followed at [facility] hepatology (treated for hep C acquired during transplant) and seizure clinics.   PLAN:  1.  Recurrent superficial invasive squamous cell carcinoma of the right lateral tongue: - Latoya Bautista underwent right partial glossectomy in September 2022. - Latoya Bautista was evaluated by Dr. Frederick Callas on 09/18/2021 at [facility].  No evidence of disease. - Physical examination today did not reveal any suspicious masses on the tongue. - Latoya Bautista had just finished a course of steroids for pulmonary fibrosis. - No palpable adenopathy in the neck region.  Reviewed labs which are grossly within normal limits.  Elevated LFTs from cirrhosis are stable.  TSH was normal. - Latoya Bautista has a follow-up with Dr. Fisk Callas in August this year. - I will see Latoya Bautista back in 1 year for follow-up.  2.  Hyponatremia: - Latoya Bautista is on carbamazepine which can cause hyponatremia. - Sodium on 11/07/2021 was 134.   Orders placed this encounter:  No orders of the defined types were placed in this encounter.    Derek Jack, MD [place] [phone]   I, Thana Ates, am acting as a scribe for Dr. Derek Jack.  I, Derek Jack MD, have reviewed the above documentation for accuracy and completeness, and I agree with the above.

## 2021-11-14 NOTE — Patient Instructions (Signed)
[place] at [facility] Discharge Instructions  You were seen and examined today by Dr. Delton Coombes.  Dr. Delton Coombes discussed your most recent lab work and everything looks good.  Follow-up as scheduled in 1 year.    Thank you for choosing [place] at [facility] to provide your oncology and hematology care.  To afford each patient quality time with our provider, please arrive at least 15 minutes before your scheduled appointment time.   If you have a lab appointment with the [place] please come in thru the Main Entrance and check in at the main information desk.  You need to re-schedule your appointment should you arrive 10 or more minutes late.  We strive to give you quality time with our providers, and arriving late affects you and other patients whose appointments are after yours.  Also, if you no show three or more times for appointments you may be dismissed from the clinic at the providers discretion.     Again, thank you for choosing [facility].  Our hope is that these requests will decrease the amount of time that you wait before being seen by our physicians.       _____________________________________________________________  Should you have questions after your visit to [facility], please contact our office at [phone] and follow the prompts.  Our office hours are 8:00 a.m. and 4:30 p.m. Monday - Friday.  Please note that voicemails left after 4:00 p.m. may not be returned until the following business day.  We are closed weekends and major holidays.  You do have access to a nurse 24-7, just call the main number to the clinic [phone] and do not press any options, hold on the line and a nurse will answer the phone.    For prescription refill requests, have your pharmacy contact our office and allow 72 hours.    Due to Covid, you will need to wear a mask upon entering the hospital. If you do  not have a mask, a mask will be given to you at the Main Entrance upon arrival. For doctor visits, patients may have 1 support person age 73 or older with them. For treatment visits, patients can not have anyone with them due to social distancing guidelines and our immunocompromised population.

## 2021-11-15 ENCOUNTER — Other Ambulatory Visit (HOSPITAL_COMMUNITY): Payer: Self-pay

## 2021-11-19 ENCOUNTER — Other Ambulatory Visit (HOSPITAL_COMMUNITY): Payer: Self-pay

## 2021-11-20 ENCOUNTER — Other Ambulatory Visit (HOSPITAL_COMMUNITY): Payer: Self-pay

## 2021-11-21 ENCOUNTER — Other Ambulatory Visit (HOSPITAL_COMMUNITY): Payer: Self-pay

## 2021-11-21 MED ORDER — FAMOTIDINE 20 MG PO TABS
20.0000 mg | ORAL_TABLET | Freq: Two times a day (BID) | ORAL | 2 refills | Status: AC
  Filled 2021-11-21: qty 60, 30d supply, fill #0

## 2021-11-26 ENCOUNTER — Encounter: Payer: Self-pay | Admitting: *Deleted

## 2021-11-26 ENCOUNTER — Encounter: Payer: Self-pay | Admitting: Physician Assistant

## 2021-11-26 ENCOUNTER — Ambulatory Visit (INDEPENDENT_AMBULATORY_CARE_PROVIDER_SITE_OTHER): Payer: 59 | Admitting: Physician Assistant

## 2021-11-26 VITALS — BP 140/78 | HR 102 | Ht 59.0 in | Wt 150.0 lb

## 2021-11-26 DIAGNOSIS — J849 Interstitial pulmonary disease, unspecified: Secondary | ICD-10-CM | POA: Diagnosis not present

## 2021-11-26 DIAGNOSIS — I2510 Atherosclerotic heart disease of native coronary artery without angina pectoris: Secondary | ICD-10-CM | POA: Diagnosis not present

## 2021-11-26 DIAGNOSIS — I359 Nonrheumatic aortic valve disorder, unspecified: Secondary | ICD-10-CM | POA: Diagnosis not present

## 2021-11-26 DIAGNOSIS — R002 Palpitations: Secondary | ICD-10-CM

## 2021-11-26 DIAGNOSIS — R072 Precordial pain: Secondary | ICD-10-CM

## 2021-11-26 MED ORDER — METOPROLOL TARTRATE 50 MG PO TABS: 50.0000 mg | ORAL_TABLET | Freq: Two times a day (BID) | ORAL | 3 refills | Status: DC

## 2021-11-26 NOTE — Patient Instructions (Signed)
Medication Instructions:   Increase Lopressor to 50 mg Two Times Daily   *If you need a refill on your cardiac medications before your next appointment, please call your pharmacy*   Lab Work: Will request labs from Dr. Juel Burrow office   If you have labs (blood work) drawn today and your tests are completely normal, you will receive your results only by: [place] (if you have MyChart) OR A paper copy in the mail If you have any lab test that is abnormal or we need to change your treatment, we will call you to review the results.   Testing/Procedures: Your physician has requested that you have an echocardiogram. Echocardiography is a painless test that uses sound waves to create images of your heart. It provides your doctor with information about the size and shape of your heart and how well your heart's chambers and valves are working. This procedure takes approximately one hour. There are no restrictions for this procedure.  Coronary CT    Follow-Up: At [facility], you and your health needs are our priority.  As part of our continuing mission to provide you with exceptional heart care, we have created designated Provider Care Teams.  These Care Teams include your primary Cardiologist (physician) and Advanced Practice Providers (APPs -  Physician Assistants and Nurse Practitioners) who all work together to provide you with the care you need, when you need it.  We recommend signing up for the patient portal called "MyChart".  Sign up information is provided on this After Visit Summary.  MyChart is used to connect with patients for Virtual Visits (Telemedicine).  Patients are able to view lab/test results, encounter notes, upcoming appointments, etc.  Non-urgent messages can be sent to your provider as well.   To learn more about what you can do with MyChart, go to [email or web address].    Your next appointment:   4 month(s)  The format for your next appointment:   In  Person  Provider:   Carlyle Dolly, MD    Other Instructions Thank you for choosing [place]!     Your cardiac CT will be scheduled at one of the below locations:   [facility] [address] [place], [place] [address] [phone]  [place] [address] [place], [place] [address] [phone]  If scheduled at [facility], please arrive at the [facility] and Children's Entrance (Entrance C2) of [facility] 30 minutes prior to test start time. You can use the FREE valet parking offered at entrance C (encouraged to control the heart rate for the test)  Proceed to the [facility] Radiology Department (first floor) to check-in and test prep.  All radiology patients and guests should use entrance C2 at [facility], accessed from [facility], even though the hospital's physical address listed is [address].    If scheduled at [facility], please arrive 15 mins early for check-in and test prep.  Please follow these instructions carefully (unless otherwise directed):  On the Night Before the Test: Be sure to Drink plenty of water. Do not consume any caffeinated/decaffeinated beverages or chocolate 12 hours prior to your test. Do not take any antihistamines 12 hours prior to your test.  On the Day of the Test: Drink plenty of water until 1 hour prior to the test. Do not eat any food 4 hours prior to the test. You may take your regular  medications prior to the test.  Take metoprolol (Lopressor) two hours prior to test. HOLD Furosemide/Hydrochlorothiazide morning of the test. FEMALES- please wear underwire-free bra if available, avoid dresses & tight clothing   *For Clinical Staff only. Please instruct patient the following:* Heart Rate Medication Recommendations for Cardiac CT  Resting HR < 50 bpm  No medication  Resting  HR 50-60 bpm and BP >110/50 mmHG   Consider Metoprolol tartrate 25 mg PO 90-120 min prior to scan  Resting HR 60-65 bpm and BP >110/50 mmHG  Metoprolol tartrate 50 mg PO 90-120 minutes prior to scan   Resting HR > 65 bpm and BP >110/50 mmHG  Metoprolol tartrate 100 mg PO 90-120 minutes prior to scan  Consider Ivabradine 10-15 mg PO or a calcium channel blocker for resting HR >60 bpm and contraindication to metoprolol tartrate  Consider Ivabradine 10-15 mg PO in combination with metoprolol tartrate for HR >80 bpm         After the Test: Drink plenty of water. After receiving IV contrast, you may experience a mild flushed feeling. This is normal. On occasion, you may experience a mild rash up to 24 hours after the test. This is not dangerous. If this occurs, you can take Benadryl 25 mg and increase your fluid intake. If you experience trouble breathing, this can be serious. If it is severe call 911 IMMEDIATELY. If it is mild, please call our office. If you take any of these medications: Glipizide/Metformin, Avandament, Glucavance, please do not take 48 hours after completing test unless otherwise instructed.  We will call to schedule your test 2-4 weeks out understanding that some insurance companies will need an authorization prior to the service being performed.   For non-scheduling related questions, please contact the cardiac imaging nurse navigator should you have any questions/concerns: Marchia Bond, Cardiac Imaging Nurse Navigator Gordy Clement, Cardiac Imaging Nurse Navigator [facility] Direct Office Dial: [phone]   For scheduling needs, including cancellations and rescheduling, please call [place], [phone].   Important Information About Sugar

## 2021-11-27 ENCOUNTER — Other Ambulatory Visit (HOSPITAL_COMMUNITY): Payer: Self-pay

## 2021-12-04 DIAGNOSIS — Z419 Encounter for procedure for purposes other than remedying health state, unspecified: Secondary | ICD-10-CM | POA: Diagnosis not present

## 2021-12-10 ENCOUNTER — Other Ambulatory Visit (HOSPITAL_COMMUNITY): Payer: Self-pay

## 2021-12-12 ENCOUNTER — Ambulatory Visit (HOSPITAL_COMMUNITY)
Admission: RE | Admit: 2021-12-12 | Discharge: 2021-12-12 | Disposition: A | Discharge status: Home / Self Care | Payer: Medicaid Other | Source: Ambulatory Visit | Attending: Physician Assistant | Admitting: Physician Assistant

## 2021-12-12 DIAGNOSIS — I359 Nonrheumatic aortic valve disorder, unspecified: Secondary | ICD-10-CM | POA: Diagnosis not present

## 2021-12-12 LAB — ECHOCARDIOGRAM COMPLETE
AR max vel: 2.47 cm2
AV Area VTI: 2.64 cm2
AV Area mean vel: 2.7 cm2
AV Mean grad: 3 mmHg
AV Peak grad: 6.6 mmHg
Ao pk vel: 1.28 m/s
Area-P 1/2: 6.17 cm2
Calc EF: 62 %
MV VTI: 3.69 cm2
S' Lateral: 2.6 cm
Single Plane A2C EF: 63.2 %
Single Plane A4C EF: 60.3 %

## 2021-12-12 NOTE — Progress Notes (Signed)
*  PRELIMINARY RESULTS* Echocardiogram 2D Echocardiogram has been performed.  Latoya Bautista 12/12/2021, 9:28 AM

## 2021-12-13 ENCOUNTER — Encounter (HOSPITAL_COMMUNITY): Payer: Self-pay

## 2021-12-14 ENCOUNTER — Telehealth (HOSPITAL_COMMUNITY): Payer: Self-pay | Admitting: Emergency Medicine

## 2021-12-14 NOTE — Telephone Encounter (Signed)
Reaching out to patient to offer assistance regarding upcoming cardiac imaging study; pt verbalizes understanding of appt date/time, parking situation and where to check in, pre-test NPO status and medications ordered, and verified current allergies; name and call back number provided for further questions should they arise Latoya Bond RN Navigator Cardiac Imaging [facility] [phone] office [phone] cell  Arrival 1200 w/c entrance '100mg'$  metoprolol tart  Difficult IV - R arm better

## 2021-12-17 ENCOUNTER — Ambulatory Visit (HOSPITAL_COMMUNITY): Payer: Medicaid Other

## 2021-12-18 DIAGNOSIS — C029 Malignant neoplasm of tongue, unspecified: Secondary | ICD-10-CM | POA: Diagnosis not present

## 2021-12-18 DIAGNOSIS — K1321 Leukoplakia of oral mucosa, including tongue: Secondary | ICD-10-CM | POA: Diagnosis not present

## 2021-12-18 DIAGNOSIS — C021 Malignant neoplasm of border of tongue: Secondary | ICD-10-CM | POA: Diagnosis not present

## 2021-12-19 ENCOUNTER — Other Ambulatory Visit (HOSPITAL_COMMUNITY): Payer: Self-pay

## 2021-12-20 ENCOUNTER — Encounter: Payer: Self-pay | Admitting: Pulmonary Disease

## 2022-01-04 ENCOUNTER — Other Ambulatory Visit: Payer: Self-pay | Admitting: Advanced Practice Midwife

## 2022-01-04 ENCOUNTER — Other Ambulatory Visit (HOSPITAL_COMMUNITY): Payer: Self-pay

## 2022-01-04 ENCOUNTER — Telehealth (HOSPITAL_COMMUNITY): Payer: Self-pay | Admitting: Emergency Medicine

## 2022-01-04 DIAGNOSIS — Z419 Encounter for procedure for purposes other than remedying health state, unspecified: Secondary | ICD-10-CM | POA: Diagnosis not present

## 2022-01-04 NOTE — Telephone Encounter (Signed)
Reaching out to patient to offer assistance regarding upcoming cardiac imaging study; pt verbalizes understanding of appt date/time, parking situation and where to check in, pre-test NPO status and medications ordered, and verified current allergies; name and call back number provided for further questions should they arise Marchia Bond RN [facility] [phone] office [phone] cell  Arrival 830 w/c entrance Difficult IV '100mg'$  metoprolol tartrate

## 2022-01-08 ENCOUNTER — Other Ambulatory Visit: Payer: Self-pay | Admitting: *Deleted

## 2022-01-08 ENCOUNTER — Other Ambulatory Visit (HOSPITAL_COMMUNITY): Payer: Self-pay

## 2022-01-08 ENCOUNTER — Encounter (HOSPITAL_COMMUNITY): Payer: Self-pay

## 2022-01-08 ENCOUNTER — Ambulatory Visit (HOSPITAL_COMMUNITY)
Admission: RE | Admit: 2022-01-08 | Discharge: 2022-01-08 | Disposition: A | Discharge status: Home / Self Care | Payer: Medicaid Other | Source: Ambulatory Visit | Attending: Physician Assistant | Admitting: Physician Assistant

## 2022-01-08 DIAGNOSIS — R072 Precordial pain: Secondary | ICD-10-CM | POA: Diagnosis not present

## 2022-01-08 MED ORDER — METOPROLOL TARTRATE 5 MG/5ML IV SOLN
10.0000 mg | INTRAVENOUS | Status: DC | PRN
  Administered 2022-01-08: 10 mg via INTRAVENOUS

## 2022-01-08 MED ORDER — ESTRADIOL 1 MG PO TABS
1.0000 mg | ORAL_TABLET | Freq: Every day | ORAL | 4 refills | Status: DC
  Filled 2022-01-08: qty 90, 90d supply, fill #0

## 2022-01-08 MED ORDER — DILTIAZEM HCL 25 MG/5ML IV SOLN
10.0000 mg | INTRAVENOUS | Status: DC | PRN
  Administered 2022-01-08: 10 mg via INTRAVENOUS

## 2022-01-08 MED ORDER — NITROGLYCERIN 0.4 MG SL SUBL: 0.8000 mg | SUBLINGUAL_TABLET | Freq: Once | SUBLINGUAL | Status: DC

## 2022-01-08 MED ORDER — METOPROLOL TARTRATE 5 MG/5ML IV SOLN
INTRAVENOUS | Status: AC
  Administered 2022-01-08: 10 mg via INTRAVENOUS
  Filled 2022-01-08: qty 30

## 2022-01-08 MED ORDER — PROGESTERONE MICRONIZED 100 MG PO CAPS
100.0000 mg | ORAL_CAPSULE | Freq: Every day | ORAL | 5 refills | Status: DC
  Filled 2022-01-08: qty 45, 45d supply, fill #0

## 2022-01-08 MED ORDER — DILTIAZEM HCL 25 MG/5ML IV SOLN
INTRAVENOUS | Status: AC
  Administered 2022-01-08: 10 mg via INTRAVENOUS
  Filled 2022-01-08: qty 5

## 2022-01-08 NOTE — Progress Notes (Signed)
Pt here for CT coronary sacn. Baseline HR 102. Premedicated with Metoprolol '100mg'$  PO. HR 98 NSR upon arrival. Pt given Metoprolol '20mg'$  IV, Dilitazem '20mg'$  IV. HR 75-80. Vitals per flowsheet. Dr. Harrell Gave made aware. Pt to be rescheduled with Ivabradine. Left message with Marchia Bond, Navigator. Pt escorted out via WC to main entranced to self.

## 2022-01-09 ENCOUNTER — Ambulatory Visit: Payer: 59 | Admitting: Physician Assistant

## 2022-01-14 ENCOUNTER — Telehealth (HOSPITAL_COMMUNITY): Payer: Self-pay | Admitting: Emergency Medicine

## 2022-01-14 DIAGNOSIS — R072 Precordial pain: Secondary | ICD-10-CM

## 2022-01-14 MED ORDER — METOPROLOL TARTRATE 100 MG PO TABS: 100.0000 mg | ORAL_TABLET | Freq: Once | ORAL | 0 refills | Status: DC

## 2022-01-14 MED ORDER — IVABRADINE HCL 5 MG PO TABS: 15.0000 mg | ORAL_TABLET | Freq: Once | ORAL | 0 refills | Status: AC

## 2022-01-14 NOTE — Telephone Encounter (Signed)
Pts CCTA r/s for 9/19 at [place] rx for '100mg'$  metoprolol tart + '15mg'$  ivabradine for HR control to [employer].  Explained to patient that we will be calling closer to the appt to review instructions so shes prepared   Marchia Bond RN Navigator Cardiac [facility] [phone] Office  [phone] Cell

## 2022-01-16 DIAGNOSIS — Z6829 Body mass index (BMI) 29.0-29.9, adult: Secondary | ICD-10-CM | POA: Diagnosis not present

## 2022-01-16 DIAGNOSIS — I10 Essential (primary) hypertension: Secondary | ICD-10-CM | POA: Diagnosis not present

## 2022-01-16 DIAGNOSIS — K7689 Other specified diseases of liver: Secondary | ICD-10-CM | POA: Diagnosis not present

## 2022-01-16 DIAGNOSIS — E7800 Pure hypercholesterolemia, unspecified: Secondary | ICD-10-CM | POA: Diagnosis not present

## 2022-01-16 DIAGNOSIS — Z23 Encounter for immunization: Secondary | ICD-10-CM | POA: Diagnosis not present

## 2022-01-16 DIAGNOSIS — Z79899 Other long term (current) drug therapy: Secondary | ICD-10-CM | POA: Diagnosis not present

## 2022-01-16 DIAGNOSIS — B1920 Unspecified viral hepatitis C without hepatic coma: Secondary | ICD-10-CM | POA: Diagnosis not present

## 2022-01-16 DIAGNOSIS — K7460 Unspecified cirrhosis of liver: Secondary | ICD-10-CM | POA: Diagnosis not present

## 2022-01-17 ENCOUNTER — Telehealth: Payer: Self-pay | Admitting: Cardiology

## 2022-01-17 DIAGNOSIS — G40201 Localization-related (focal) (partial) symptomatic epilepsy and epileptic syndromes with complex partial seizures, not intractable, with status epilepticus: Secondary | ICD-10-CM | POA: Diagnosis not present

## 2022-01-17 DIAGNOSIS — K229 Disease of esophagus, unspecified: Secondary | ICD-10-CM | POA: Diagnosis not present

## 2022-01-17 DIAGNOSIS — E7800 Pure hypercholesterolemia, unspecified: Secondary | ICD-10-CM | POA: Diagnosis not present

## 2022-01-17 DIAGNOSIS — M79671 Pain in right foot: Secondary | ICD-10-CM | POA: Diagnosis not present

## 2022-01-17 DIAGNOSIS — D471 Chronic myeloproliferative disease: Secondary | ICD-10-CM | POA: Diagnosis not present

## 2022-01-17 DIAGNOSIS — I10 Essential (primary) hypertension: Secondary | ICD-10-CM | POA: Diagnosis not present

## 2022-01-17 DIAGNOSIS — E038 Other specified hypothyroidism: Secondary | ICD-10-CM | POA: Diagnosis not present

## 2022-01-17 NOTE — Telephone Encounter (Signed)
Pt came into the office and stated that she had an Echo performed in August and her insurance denied it. She stated that the insurance reached out to [place] to get a letter stating medical necessity but we have not responded. She has received a bill for the echo. The echo was ordered by Latoya Bautista in July.

## 2022-01-18 NOTE — Telephone Encounter (Signed)
Spoke with pt who states that her echo with denied by the insurance company. Pt has the letter stating this but is out of town and will bring the letter by the office on Monday.

## 2022-01-21 ENCOUNTER — Telehealth (HOSPITAL_COMMUNITY): Payer: Self-pay | Admitting: *Deleted

## 2022-01-21 NOTE — Telephone Encounter (Signed)
Reaching out to patient to offer assistance regarding upcoming cardiac imaging study; pt verbalizes understanding of appt date/time,  medications ordered, and verified current allergies; name and call back number provided for further questions should they arise  Gordy Clement RN Navigator Cardiac Imaging [facility] [phone] office [phone] cell  Patient to take '100mg'$  metoprolol tartrate and '15mg'$  ivabradine two hours prior to her cardiac CT scan.  She is aware to arrive at 7:30am.

## 2022-01-22 ENCOUNTER — Telehealth: Payer: Self-pay

## 2022-01-22 ENCOUNTER — Other Ambulatory Visit: Payer: Self-pay | Admitting: Cardiology

## 2022-01-22 ENCOUNTER — Ambulatory Visit (HOSPITAL_COMMUNITY)
Admission: RE | Admit: 2022-01-22 | Discharge: 2022-01-22 | Disposition: A | Discharge status: Home / Self Care | Payer: Medicaid Other | Source: Ambulatory Visit | Attending: Cardiology | Admitting: Cardiology

## 2022-01-22 ENCOUNTER — Ambulatory Visit (HOSPITAL_COMMUNITY)
Admission: RE | Admit: 2022-01-22 | Discharge: 2022-01-22 | Disposition: A | Discharge status: Home / Self Care | Payer: Medicaid Other | Source: Ambulatory Visit | Attending: Physician Assistant | Admitting: Physician Assistant

## 2022-01-22 ENCOUNTER — Ambulatory Visit (HOSPITAL_BASED_OUTPATIENT_CLINIC_OR_DEPARTMENT_OTHER)
Admission: RE | Admit: 2022-01-22 | Discharge: 2022-01-22 | Disposition: A | Discharge status: Home / Self Care | Payer: Medicaid Other | Source: Ambulatory Visit | Attending: Cardiology | Admitting: Cardiology

## 2022-01-22 DIAGNOSIS — R072 Precordial pain: Secondary | ICD-10-CM | POA: Insufficient documentation

## 2022-01-22 DIAGNOSIS — R931 Abnormal findings on diagnostic imaging of heart and coronary circulation: Secondary | ICD-10-CM | POA: Insufficient documentation

## 2022-01-22 DIAGNOSIS — I2510 Atherosclerotic heart disease of native coronary artery without angina pectoris: Secondary | ICD-10-CM

## 2022-01-22 MED ORDER — NITROGLYCERIN 0.4 MG SL SUBL
SUBLINGUAL_TABLET | SUBLINGUAL | Status: AC
  Filled 2022-01-22: qty 2

## 2022-01-22 MED ORDER — NITROGLYCERIN 0.4 MG SL SUBL
0.8000 mg | SUBLINGUAL_TABLET | Freq: Once | SUBLINGUAL | Status: AC
  Administered 2022-01-22: 0.8 mg via SUBLINGUAL

## 2022-01-22 MED ORDER — IOHEXOL 350 MG/ML SOLN
100.0000 mL | Freq: Once | INTRAVENOUS | Status: AC | PRN
  Administered 2022-01-22: 100 mL via INTRAVENOUS

## 2022-01-22 NOTE — Telephone Encounter (Signed)
I spoke with patient and discussed CT results. She will have labs drawn at [facility] and agrees to speak with Lipid management clinic.

## 2022-01-22 NOTE — Telephone Encounter (Signed)
-----   Message from April Garrison, [place] sent at 01/22/2022  2:18 PM EDT -----  ----- Message ----- From: Imogene Burn, PA-C Sent: 01/22/2022   1:05 PM EDT To: April Garrison, CMA  CT shows pulm fibrosis as on prior CT CTA calcium score 88 with moderate calcification in LAD and some in RCA. FFR was sent and no blockage. Should have FLP and LFT's and and set up to see lipid clinic. Should be on lipid lowering meds but LFT's recently elevated.thanks

## 2022-01-23 ENCOUNTER — Ambulatory Visit (INDEPENDENT_AMBULATORY_CARE_PROVIDER_SITE_OTHER): Payer: Medicaid Other | Admitting: Podiatry

## 2022-01-23 ENCOUNTER — Other Ambulatory Visit: Payer: Self-pay

## 2022-01-23 ENCOUNTER — Encounter: Payer: Self-pay | Admitting: Podiatry

## 2022-01-23 ENCOUNTER — Ambulatory Visit (INDEPENDENT_AMBULATORY_CARE_PROVIDER_SITE_OTHER): Payer: Medicaid Other

## 2022-01-23 ENCOUNTER — Other Ambulatory Visit (HOSPITAL_COMMUNITY)
Admission: RE | Admit: 2022-01-23 | Discharge: 2022-01-23 | Disposition: A | Discharge status: Home / Self Care | Payer: Medicaid Other | Source: Ambulatory Visit | Attending: Cardiology | Admitting: Cardiology

## 2022-01-23 DIAGNOSIS — R931 Abnormal findings on diagnostic imaging of heart and coronary circulation: Secondary | ICD-10-CM | POA: Insufficient documentation

## 2022-01-23 DIAGNOSIS — I2510 Atherosclerotic heart disease of native coronary artery without angina pectoris: Secondary | ICD-10-CM | POA: Insufficient documentation

## 2022-01-23 DIAGNOSIS — M779 Enthesopathy, unspecified: Secondary | ICD-10-CM | POA: Diagnosis not present

## 2022-01-23 LAB — HEPATIC FUNCTION PANEL
ALT: 29 U/L (ref 0–44)
AST: 39 U/L (ref 15–41)
Albumin: 4.2 g/dL (ref 3.5–5.0)
Alkaline Phosphatase: 43 U/L (ref 38–126)
Bilirubin, Direct: 0.1 mg/dL (ref 0.0–0.2)
Indirect Bilirubin: 0.7 mg/dL (ref 0.3–0.9)
Total Bilirubin: 0.8 mg/dL (ref 0.3–1.2)
Total Protein: 7.5 g/dL (ref 6.5–8.1)

## 2022-01-23 LAB — LIPID PANEL
Cholesterol: 125 mg/dL (ref 0–200)
HDL: 40 mg/dL — ABNORMAL LOW (ref >40)
LDL Cholesterol: 56 mg/dL (ref 0–99)
Total CHOL/HDL Ratio: 3.1 RATIO
Triglycerides: 143 mg/dL (ref <150)
VLDL: 29 mg/dL (ref 0–40)

## 2022-01-23 MED ORDER — TRIAMCINOLONE ACETONIDE 10 MG/ML IJ SUSP
20.0000 mg | Freq: Once | INTRAMUSCULAR | Status: AC
  Administered 2022-01-23: 20 mg

## 2022-01-24 DIAGNOSIS — R682 Dry mouth, unspecified: Secondary | ICD-10-CM | POA: Diagnosis not present

## 2022-01-24 DIAGNOSIS — K1321 Leukoplakia of oral mucosa, including tongue: Secondary | ICD-10-CM | POA: Diagnosis not present

## 2022-01-24 DIAGNOSIS — B370 Candidal stomatitis: Secondary | ICD-10-CM | POA: Diagnosis not present

## 2022-01-24 NOTE — Progress Notes (Signed)
Subjective:   Patient ID: Latoya Bautista, female   DOB: 51 y.o.   MRN: [id number]   HPI Patient presents stating she has developed pain on top of her both feet for about 4 months duration.  Does not remember specific incident of why it occurred and she has been wearing a walking boot on the left which is moderately helpful.  Patient did not have any steroid injections to the area had a generalized 1 4 months ago   ROS      Objective:  Physical Exam  Neurovascular status intact muscle strength adequate well structured bunion correction right first metatarsal bunion deformity left with patient noted to have inflammation pain of the extensor midtarsal joint complex bilateral     Assessment:  The that this is inflammatory and tendinous in its origin with inflammation     Plan:  Reviewed condition and went ahead today and did do sterile prep and carefully injected the extensor complex bilateral 3 mg dexamethasone Kenalog 5 mg Xylocaine after explaining risk of injections.  I then advised on ice therapy anti-inflammatories and topical medications and reappoint as needed  X-rays indicate that there was no signs of stress fracture mild arthritis no overt signs with good correction bunion deformity right structural bunion deformity left

## 2022-01-25 DIAGNOSIS — D528 Other folate deficiency anemias: Secondary | ICD-10-CM | POA: Diagnosis not present

## 2022-01-25 DIAGNOSIS — D471 Chronic myeloproliferative disease: Secondary | ICD-10-CM | POA: Diagnosis not present

## 2022-01-25 DIAGNOSIS — G40201 Localization-related (focal) (partial) symptomatic epilepsy and epileptic syndromes with complex partial seizures, not intractable, with status epilepticus: Secondary | ICD-10-CM | POA: Diagnosis not present

## 2022-01-25 DIAGNOSIS — E559 Vitamin D deficiency, unspecified: Secondary | ICD-10-CM | POA: Diagnosis not present

## 2022-01-25 DIAGNOSIS — M810 Age-related osteoporosis without current pathological fracture: Secondary | ICD-10-CM | POA: Diagnosis not present

## 2022-01-25 DIAGNOSIS — E038 Other specified hypothyroidism: Secondary | ICD-10-CM | POA: Diagnosis not present

## 2022-01-25 DIAGNOSIS — E7800 Pure hypercholesterolemia, unspecified: Secondary | ICD-10-CM | POA: Diagnosis not present

## 2022-01-30 ENCOUNTER — Ambulatory Visit: Payer: Medicaid Other | Admitting: Podiatry

## 2022-01-30 DIAGNOSIS — H5213 Myopia, bilateral: Secondary | ICD-10-CM | POA: Diagnosis not present

## 2022-02-03 DIAGNOSIS — Z419 Encounter for procedure for purposes other than remedying health state, unspecified: Secondary | ICD-10-CM | POA: Diagnosis not present

## 2022-02-04 ENCOUNTER — Other Ambulatory Visit: Payer: Self-pay | Admitting: Hematology

## 2022-02-04 DIAGNOSIS — K7460 Unspecified cirrhosis of liver: Secondary | ICD-10-CM

## 2022-02-04 DIAGNOSIS — D696 Thrombocytopenia, unspecified: Secondary | ICD-10-CM

## 2022-02-08 ENCOUNTER — Ambulatory Visit (INDEPENDENT_AMBULATORY_CARE_PROVIDER_SITE_OTHER): Payer: Medicaid Other | Admitting: Podiatry

## 2022-02-08 DIAGNOSIS — M722 Plantar fascial fibromatosis: Secondary | ICD-10-CM

## 2022-02-08 MED ORDER — DICLOFENAC SODIUM 1 % EX GEL: 2.0000 g | Freq: Four times a day (QID) | CUTANEOUS | 2 refills | Status: DC

## 2022-02-08 NOTE — Patient Instructions (Signed)
For shoes I like New Balance, Hoka, Brooks  For inserts I like Powersteps, superfeet, aetrex.   Plantar Fasciitis (Heel Spur Syndrome) with Rehab The plantar fascia is a fibrous, ligament-like, soft-tissue structure that spans the bottom of the foot. Plantar fasciitis is a condition that causes pain in the foot due to inflammation of the tissue. SYMPTOMS  Pain and tenderness on the underneath side of the foot. Pain that worsens with standing or walking. CAUSES  Plantar fasciitis is caused by irritation and injury to the plantar fascia on the underneath side of the foot. Common mechanisms of injury include: Direct trauma to bottom of the foot. Damage to a small nerve that runs under the foot where the main fascia attaches to the heel bone. Stress placed on the plantar fascia due to bone spurs. RISK INCREASES WITH:  Activities that place stress on the plantar fascia (running, jumping, pivoting, or cutting). Poor strength and flexibility. Improperly fitted shoes. Tight calf muscles. Flat feet. Failure to warm-up properly before activity. Obesity. PREVENTION Warm up and stretch properly before activity. Allow for adequate recovery between workouts. Maintain physical fitness: Strength, flexibility, and endurance. Cardiovascular fitness. Maintain a health body weight. Avoid stress on the plantar fascia. Wear properly fitted shoes, including arch supports for individuals who have flat feet.  PROGNOSIS  If treated properly, then the symptoms of plantar fasciitis usually resolve without surgery. However, occasionally surgery is necessary.  RELATED COMPLICATIONS  Recurrent symptoms that may result in a chronic condition. Problems of the lower back that are caused by compensating for the injury, such as limping. Pain or weakness of the foot during push-off following surgery. Chronic inflammation, scarring, and partial or complete fascia tear, occurring more often from repeated  injections.  TREATMENT  Treatment initially involves the use of ice and medication to help reduce pain and inflammation. The use of strengthening and stretching exercises may help reduce pain with activity, especially stretches of the Achilles tendon. These exercises may be performed at home or with a therapist. Your caregiver may recommend that you use heel cups of arch supports to help reduce stress on the plantar fascia. Occasionally, corticosteroid injections are given to reduce inflammation. If symptoms persist for greater than 6 months despite non-surgical (conservative), then surgery may be recommended.   MEDICATION  If pain medication is necessary, then nonsteroidal anti-inflammatory medications, such as aspirin and ibuprofen, or other minor pain relievers, such as acetaminophen, are often recommended. Do not take pain medication within 7 days before surgery. Prescription pain relievers may be given if deemed necessary by your caregiver. Use only as directed and only as much as you need. Corticosteroid injections may be given by your caregiver. These injections should be reserved for the most serious cases, because they may only be given a certain number of times.  HEAT AND COLD Cold treatment (icing) relieves pain and reduces inflammation. Cold treatment should be applied for 10 to 15 minutes every 2 to 3 hours for inflammation and pain and immediately after any activity that aggravates your symptoms. Use ice packs or massage the area with a piece of ice (ice massage). Heat treatment may be used prior to performing the stretching and strengthening activities prescribed by your caregiver, physical therapist, or athletic trainer. Use a heat pack or soak the injury in warm water.  SEEK IMMEDIATE MEDICAL CARE IF: Treatment seems to offer no benefit, or the condition worsens. Any medications produce adverse side effects.  EXERCISES- RANGE OF MOTION (ROM) AND STRETCHING EXERCISES -  Plantar  Fasciitis (Heel Spur Syndrome) These exercises may help you when beginning to rehabilitate your injury. Your symptoms may resolve with or without further involvement from your physician, physical therapist or athletic trainer. While completing these exercises, remember:  Restoring tissue flexibility helps normal motion to return to the joints. This allows healthier, less painful movement and activity. An effective stretch should be held for at least 30 seconds. A stretch should never be painful. You should only feel a gentle lengthening or release in the stretched tissue.  RANGE OF MOTION - Toe Extension, Flexion Sit with your right / left leg crossed over your opposite knee. Grasp your toes and gently pull them back toward the top of your foot. You should feel a stretch on the bottom of your toes and/or foot. Hold this stretch for 10 seconds. Now, gently pull your toes toward the bottom of your foot. You should feel a stretch on the top of your toes and or foot. Hold this stretch for 10 seconds. Repeat  times. Complete this stretch 3 times per day.   RANGE OF MOTION - Ankle Dorsiflexion, Active Assisted Remove shoes and sit on a chair that is preferably not on a carpeted surface. Place right / left foot under knee. Extend your opposite leg for support. Keeping your heel down, slide your right / left foot back toward the chair until you feel a stretch at your ankle or calf. If you do not feel a stretch, slide your bottom forward to the edge of the chair, while still keeping your heel down. Hold this stretch for 10 seconds. Repeat 3 times. Complete this stretch 2 times per day.   STRETCH  Gastroc, Standing Place hands on wall. Extend right / left leg, keeping the front knee somewhat bent. Slightly point your toes inward on your back foot. Keeping your right / left heel on the floor and your knee straight, shift your weight toward the wall, not allowing your back to arch. You should feel a  gentle stretch in the right / left calf. Hold this position for 10 seconds. Repeat 3 times. Complete this stretch 2 times per day.  STRETCH  Soleus, Standing Place hands on wall. Extend right / left leg, keeping the other knee somewhat bent. Slightly point your toes inward on your back foot. Keep your right / left heel on the floor, bend your back knee, and slightly shift your weight over the back leg so that you feel a gentle stretch deep in your back calf. Hold this position for 10 seconds. Repeat 3 times. Complete this stretch 2 times per day.  STRETCH  Gastrocsoleus, Standing  Note: This exercise can place a lot of stress on your foot and ankle. Please complete this exercise only if specifically instructed by your caregiver.  Place the ball of your right / left foot on a step, keeping your other foot firmly on the same step. Hold on to the wall or a rail for balance. Slowly lift your other foot, allowing your body weight to press your heel down over the edge of the step. You should feel a stretch in your right / left calf. Hold this position for 10 seconds. Repeat this exercise with a slight bend in your right / left knee. Repeat 3 times. Complete this stretch 2 times per day.   STRENGTHENING EXERCISES - Plantar Fasciitis (Heel Spur Syndrome)  These exercises may help you when beginning to rehabilitate your injury. They may resolve your symptoms with or without  further involvement from your physician, physical therapist or athletic trainer. While completing these exercises, remember:  Muscles can gain both the endurance and the strength needed for everyday activities through controlled exercises. Complete these exercises as instructed by your physician, physical therapist or athletic trainer. Progress the resistance and repetitions only as guided.  STRENGTH - Towel Curls Sit in a chair positioned on a non-carpeted surface. Place your foot on a towel, keeping your heel on the  floor. Pull the towel toward your heel by only curling your toes. Keep your heel on the floor. Repeat 3 times. Complete this exercise 2 times per day.  STRENGTH - Ankle Inversion Secure one end of a rubber exercise band/tubing to a fixed object (table, pole). Loop the other end around your foot just before your toes. Place your fists between your knees. This will focus your strengthening at your ankle. Slowly, pull your big toe up and in, making sure the band/tubing is positioned to resist the entire motion. Hold this position for 10 seconds. Have your muscles resist the band/tubing as it slowly pulls your foot back to the starting position. Repeat 3 times. Complete this exercises 2 times per day.  Document Released: 04/22/2005 Document Revised: 07/15/2011 Document Reviewed: 08/04/2008 [facility] Patient Information 2014 [place], [place].

## 2022-02-08 NOTE — Progress Notes (Signed)
Subjective: Chief Complaint  Patient presents with   Foot Pain    Patient came in today for a bilateral foot pain follow-up, patient is now having pain all over the foot, Rate of pain 10 out of 10 after walking, patient states she is feeling the same as last visit, TX: cam boot on the left foot, injections(didn't seem to help)    51 y.o. female presents with the above complains. She states has been ongoing for a few months. It started on the right foot then it started to hurt on the top of the left foot. She had an injection on the left and it did not help.  Also been having discomfort on the bottom of her heel.  She is wearing a CAM boot which helps. She has not noticed swelling.  No injuries that she reports.  Objective: AAO x3, NAD DP/PT pulses palpable bilaterally, CRT less than 3 seconds There is no area pinpoint tenderness noted today.  There is no significant pain noted today along the extensor complex midfoot discomfort but no specific area of tenderness.  There is discomfort On the plantar medial tubercle of the calcaneus along the insertion of plantar fascia.  There is no pain with lateral compression of the calcaneus.  No edema, erythema.  NT 5/5. No pain with calf compression, swelling, warmth, erythema  Assessment: 51 year old female with tendinitis, Plantar fasciitis  Plan: -All treatment options discussed with the patient including all alternatives, risks, complications.  -I again reviewed the x-rays with her.  We discussed both conservative as well as surgical options. -We discussed shoe modification to provide better arch support as well as inserts to wear inside the shoes if needed.  We discussed home stretching, icing exercises. -Wants prescription for shoes to turn into her insurance.   -We discussed topical medications to help as well. -Patient encouraged to call the office with any questions, concerns, change in symptoms.   Trula Slade DPM

## 2022-02-19 ENCOUNTER — Ambulatory Visit: Payer: Medicaid Other | Admitting: Podiatry

## 2022-02-28 ENCOUNTER — Ambulatory Visit: Payer: Medicaid Other | Attending: Cardiology | Admitting: Pharmacist

## 2022-02-28 DIAGNOSIS — I2510 Atherosclerotic heart disease of native coronary artery without angina pectoris: Secondary | ICD-10-CM

## 2022-02-28 DIAGNOSIS — R931 Abnormal findings on diagnostic imaging of heart and coronary circulation: Secondary | ICD-10-CM

## 2022-02-28 NOTE — Progress Notes (Signed)
Patient ID: [name]                 DOB: 20-Apr-1971                    MRN: [id number]      HPI: Latoya Bautista is a 51 y.o. female patient referred to lipid clinic by Latoya Bautista. PMH is significant for interstitial lung disease, Hep C, cirrhosis of the liver, melanoma, and elevated coronary calcium score.  Patient presents today in good spirits. Had updated lipid panel on 9/20 which showed LDL at 56 with normal LFTs. However recent coronary CT showed elevated coronary calcium score placing her at Latoya Bautista percentile.  Has been working on exercise. Going to [facility] during the week. Does not use tobacco.  Current Medications:  Rosuvastatin '40mg'$  daily  Intolerances:  Simvastatin  Risk Factors:  CAD Elevated coronary calcium score  LDL goal: <70  Labs: TC 125, Trigs 143, HDL 40, LDL 56 (01/23/22 on rosuvastatin 40)  Coronary calcium score of 88.4. This was 31 percentile for age and sex matched control.  Past Medical History:  Diagnosis Date   Anemia    Cancer ([place])    tongue cancer   H/O echocardiogram    a. 07/2017: echo showing EF of 55-60%, Grade 1 DD, and no significant valve abnormalities.    Hepatitis C    Hepatitis C 04/11/2011   Leukemia ([place])    Liver cirrhosis ([place]) 04/11/2011   Osteoporosis    Seizures ([place])    Shingles    Thrombocytopenia ([place]) 04/11/2011   Thyroid disease     Current Outpatient Medications on File Prior to Visit  Medication Sig Dispense Refill   cholecalciferol (VITAMIN D) 1000 units tablet Take 1,000 Units by mouth daily.     denosumab (PROLIA) 60 MG/ML SOSY injection Inject 60 mg into the skin every 6 months ,to be given in the office 1 mL 0   diclofenac Sodium (VOLTAREN) 1 % GEL Apply 2 g topically 4 (four) times daily. Rub into affected area of foot 2 to 4 times daily 100 g 2   estradiol (ESTRACE) 1 MG tablet Take 1 tablet (1 mg total) by mouth daily. 90 tablet 4   famotidine (PEPCID) 20 MG tablet Take 1 tablet (20 mg total) by mouth 2  (two) times daily. 60 tablet 2   folic acid (FOLVITE) 1 MG tablet TAKE 1 TABLET ONCE DAILY. 30 tablet 0   Garlic 3664 MG CAPS Take 1 capsule by mouth daily.     lamoTRIgine (LAMICTAL XR) 50 MG 24 hour tablet Take 3 tablets (150 mg total) by mouth 2 (two) times daily. 540 tablet 3   metoprolol tartrate (LOPRESSOR) 100 MG tablet Take 1 tablet (100 mg total) by mouth once for 1 dose. Please take one time dose '100mg'$  metoprolol tartrate 2 hr prior to cardiac CT for HR control IF HR >55bpm. 1 tablet 0   Multiple Vitamins-Minerals (CENTRUM PO) Take 1 tablet by mouth daily.       olmesartan (BENICAR) 20 MG tablet Take 1 tablet (20 mg total) by mouth daily. 90 tablet 0   pantoprazole (PROTONIX) 40 MG tablet Take 1 tablet (40 mg total) by mouth daily. 90 tablet 1   progesterone (PROMETRIUM) 100 MG capsule Take 1 capsule (100 mg total) by mouth daily for 15 days per month. 45 capsule 5   rosuvastatin (CRESTOR) 40 MG tablet Take 1 tablet (40 mg total) by mouth at bedtime.  90 tablet 1   sodium bicarbonate 325 MG tablet Take by mouth.     SYNTHROID 75 MCG tablet Take 1 tablet (75 mcg total) by mouth daily. 90 tablet 1   vitamin C (ASCORBIC ACID) 500 MG tablet Take 500 mg by mouth daily.      Vitamin D, Ergocalciferol, (DRISDOL) 1.25 MG (50000 UNIT) CAPS capsule Take 1 capsule (50,000 Units total) by mouth every OTHER week as directed 20 capsule 1   vitamin E 400 UNIT capsule Take 400 Units by mouth 2 (two) times daily.     [DISCONTINUED] lacosamide (VIMPAT) 50 MG TABS tablet Take 1 tablet (50 mg total) by mouth in the morning and at bedtime for 10 days, THEN 2 tablets (100 mg total) in the morning and at bedtime for 10 days, THEN 3 tablets (150 mg total) in the morning and at bedtime for 10 days. 120 tablet 0   No current facility-administered medications on file prior to visit.    Allergies  Allergen Reactions   Simvastatin Rash   Briviact [Brivaracetam]     Elevated LFT's   Keppra [Levetiracetam]      Rash   Asparaginase Derivatives Rash   Elspar [Asparaginase] Rash   Soap Rash    Procedure prep soap    Assessment/Plan:  1. Hyperlipidemia - Patient's LDL well controlled at 56 on rosuvastatin '40mg'$  daily. However would benefit from elevated coronary calcium score. Discussed options with patient such as Zetia and Repatha. Unknown if Repatha will be covered by Medicaid since patient's LDL is low but will submit prior authorization anyway. Using demo pen, educated patient on mechanism of action, storage, site selection, and administration. Patient voiced understanding. If not approved by Medicaid, will discuss Zetia or continue rosuvastatin as monotherapy. Patient voiced understanding.  Emphasized lifestyle changes. Encouraged increasing vegetables, whole grains, healthy fats and lean proteins. Emphasized continued exercise at least 30 minutes a day at least 5 days a week.  Continue rosuvastatin '40mg'$  daily  Karren Cobble, PharmD, [place], [place], [place], [place], [place], [address] Phone: [phone], Fax: [phone]

## 2022-02-28 NOTE — Patient Instructions (Addendum)
It was nice meeting you today  Your LDL (bad cholesterol) level is at goal today. We would like to keep it less than 70  Please continue your rosuvastatin '40mg'$  once daily  I will complete the prior authorization for Repatha to see if Medicaid pays for it  I recommend you continuing your exercise.  Try to make sure you are getting at least 30 minutes a day at least 5 days a week   Please call with any questions  Karren Cobble, PharmD, [place], [place], [place], [place], [place], [address] Phone: [phone], Fax: [phone]

## 2022-03-06 DIAGNOSIS — Z419 Encounter for procedure for purposes other than remedying health state, unspecified: Secondary | ICD-10-CM | POA: Diagnosis not present

## 2022-03-08 ENCOUNTER — Encounter: Payer: Self-pay | Admitting: Pharmacist

## 2022-03-15 ENCOUNTER — Encounter: Payer: Self-pay | Admitting: Obstetrics & Gynecology

## 2022-03-15 ENCOUNTER — Ambulatory Visit (INDEPENDENT_AMBULATORY_CARE_PROVIDER_SITE_OTHER): Payer: Medicaid Other | Admitting: Obstetrics & Gynecology

## 2022-03-15 VITALS — BP 148/87 | HR 102 | Ht 59.0 in | Wt 150.0 lb

## 2022-03-15 DIAGNOSIS — Z01419 Encounter for gynecological examination (general) (routine) without abnormal findings: Secondary | ICD-10-CM

## 2022-03-15 DIAGNOSIS — Z1231 Encounter for screening mammogram for malignant neoplasm of breast: Secondary | ICD-10-CM | POA: Diagnosis not present

## 2022-03-15 DIAGNOSIS — Z0000 Encounter for general adult medical examination without abnormal findings: Secondary | ICD-10-CM | POA: Diagnosis not present

## 2022-03-15 NOTE — Progress Notes (Signed)
Subjective:     Latoya Bautista is a 51 y.o. female here for a routine exam.  No LMP recorded. Patient is postmenopausal. G0P0000 Birth Control Method:  na Menstrual Calendar(currently): amenorrheic  Current complaints: none.   Current acute medical issues:     Recent Gynecologic History No LMP recorded. Patient is postmenopausal. Last Pap: 2021,  normal Last mammogram: 06/05/21,  normal  Past Medical History:  Diagnosis Date   Anemia    Cancer ([place])    tongue cancer   H/O echocardiogram    a. 07/2017: echo showing EF of 55-60%, Grade 1 DD, and no significant valve abnormalities.    Hepatitis C    Hepatitis C 04/11/2011   Leukemia (HCC)    Liver cirrhosis ([place]) 04/11/2011   Osteoporosis    Pulmonary fibrosis (HCC)    Seizures (HCC)    Shingles    Thrombocytopenia ([place]) 04/11/2011   Thyroid disease     Past Surgical History:  Procedure Laterality Date   BONE MARROW BIOPSY  05/07/2011   BONE MARROW TRANSPLANT     BUNIONECTOMY     CHOLECYSTECTOMY     liver biopsie     TONGUE BIOPSY     tongue cancer     surgical removal of area    OB History     Gravida  0   Para  0   Term  0   Preterm  0   AB  0   Living  0      SAB  0   IAB  0   Ectopic  0   Multiple  0   Live Births  0           Social History   Socioeconomic History   Marital status: Married    Spouse name: Shanon Brow   Number of children: 0   Years of education: 12th   Highest education level: Not on file  Occupational History    Employer: [employer]  Tobacco Use   Smoking status: Never   Smokeless tobacco: Never  Vaping Use   Vaping Use: Never used  Substance and Sexual Activity   Alcohol use: No   Drug use: No   Sexual activity: Not Currently    Birth control/protection: None, Post-menopausal  Other Topics Concern   Not on file  Social History Narrative   Patient lives at home with her spouse.   Caffeine Use: 16oz bottle of soda daily   Social  Determinants of Health   Financial Resource Strain: High Risk (03/15/2022)   Overall Financial Resource Strain (CARDIA)    Difficulty of Paying Living Expenses: Hard  Food Insecurity: Food Insecurity Present (03/15/2022)   Hunger Vital Sign    Worried About Running Out of Food in the Last Year: Sometimes true    Ran Out of Food in the Last Year: Sometimes true  Transportation Needs: No Transportation Needs (03/15/2022)   PRAPARE - Hydrologist (Medical): No    Lack of Transportation (Non-Medical): No  Physical Activity: Sufficiently Active (03/15/2022)   Exercise Vital Sign    Days of Exercise per Week: 6 days    Minutes of Exercise per Session: 100 min  Stress: No Stress Concern Present (03/15/2022)   [place]    Feeling of Stress : Not at all  Social Connections: Socially Integrated (03/15/2022)   Social Connection and Isolation Panel [NHANES]    Frequency of Communication  with Friends and Family: Three times a week    Frequency of Social Gatherings with Friends and Family: Three times a week    Attends Religious Services: More than 4 times per year    Active Member of Clubs or Organizations: Yes    Attends Music therapist: More than 4 times per year    Marital Status: Married    Family History  Problem Relation Age of Onset   Hypertension Father    Diabetes Maternal Uncle      Current Outpatient Medications:    cholecalciferol (VITAMIN D) 1000 units tablet, Take 1,000 Units by mouth daily., Disp: , Rfl:    estradiol (ESTRACE) 1 MG tablet, Take 1 tablet (1 mg total) by mouth daily., Disp: 90 tablet, Rfl: 4   famotidine (PEPCID) 20 MG tablet, Take 1 tablet (20 mg total) by mouth 2 (two) times daily., Disp: 60 tablet, Rfl: 2   fenofibrate (TRICOR) 145 MG tablet, Take 145 mg by mouth daily., Disp: , Rfl:    folic acid (FOLVITE) 1 MG tablet, TAKE 1 TABLET ONCE DAILY.,  Disp: 30 tablet, Rfl: 0   Garlic 5188 MG CAPS, Take 1 capsule by mouth daily., Disp: , Rfl:    lamoTRIgine (LAMICTAL XR) 50 MG 24 hour tablet, Take 3 tablets (150 mg total) by mouth 2 (two) times daily., Disp: 540 tablet, Rfl: 3   Multiple Vitamins-Minerals (CENTRUM PO), Take 1 tablet by mouth daily.  , Disp: , Rfl:    olmesartan (BENICAR) 20 MG tablet, Take 1 tablet (20 mg total) by mouth daily., Disp: 90 tablet, Rfl: 0   pantoprazole (PROTONIX) 40 MG tablet, Take 1 tablet (40 mg total) by mouth daily., Disp: 90 tablet, Rfl: 1   progesterone (PROMETRIUM) 100 MG capsule, Take 1 capsule (100 mg total) by mouth daily for 15 days per month., Disp: 45 capsule, Rfl: 5   rosuvastatin (CRESTOR) 40 MG tablet, Take 1 tablet (40 mg total) by mouth at bedtime., Disp: 90 tablet, Rfl: 1   sodium bicarbonate 325 MG tablet, Take by mouth., Disp: , Rfl:    SYNTHROID 75 MCG tablet, Take 1 tablet (75 mcg total) by mouth daily., Disp: 90 tablet, Rfl: 1   Teriparatide, Recombinant, (FORTEO) 600 MCG/2.4ML SOPN, Inject into the skin daily., Disp: , Rfl:    vitamin C (ASCORBIC ACID) 500 MG tablet, Take 500 mg by mouth daily. , Disp: , Rfl:    Vitamin D, Ergocalciferol, (DRISDOL) 1.25 MG (50000 UNIT) CAPS capsule, Take 1 capsule (50,000 Units total) by mouth every OTHER week as directed, Disp: 20 capsule, Rfl: 1   vitamin E 400 UNIT capsule, Take 400 Units by mouth 2 (two) times daily., Disp: , Rfl:    denosumab (PROLIA) 60 MG/ML SOSY injection, Inject 60 mg into the skin every 6 months ,to be given in the office (Patient not taking: Reported on 03/15/2022), Disp: 1 mL, Rfl: 0   diclofenac Sodium (VOLTAREN) 1 % GEL, Apply 2 g topically 4 (four) times daily. Rub into affected area of foot 2 to 4 times daily (Patient not taking: Reported on 03/15/2022), Disp: 100 g, Rfl: 2   metoprolol tartrate (LOPRESSOR) 100 MG tablet, Take 1 tablet (100 mg total) by mouth once for 1 dose. Please take one time dose 140m metoprolol tartrate  2 hr prior to cardiac CT for HR control IF HR >55bpm., Disp: 1 tablet, Rfl: 0  Review of Systems  Review of Systems  Constitutional: Negative for fever, chills, weight loss,  malaise/fatigue and diaphoresis.  HENT: Negative for hearing loss, ear pain, nosebleeds, congestion, sore throat, neck pain, tinnitus and ear discharge.   Eyes: Negative for blurred vision, double vision, photophobia, pain, discharge and redness.  Respiratory: Negative for cough, hemoptysis, sputum production, shortness of breath, wheezing and stridor.   Cardiovascular: Negative for chest pain, palpitations, orthopnea, claudication, leg swelling and PND.  Gastrointestinal: negative for abdominal pain. Negative for heartburn, nausea, vomiting, diarrhea, constipation, blood in stool and melena.  Genitourinary: Negative for dysuria, urgency, frequency, hematuria and flank pain.  Musculoskeletal: Negative for myalgias, back pain, joint pain and falls.  Skin: Negative for itching and rash.  Neurological: Negative for dizziness, tingling, tremors, sensory change, speech change, focal weakness, seizures, loss of consciousness, weakness and headaches.  Endo/Heme/Allergies: Negative for environmental allergies and polydipsia. Does not bruise/bleed easily.  Psychiatric/Behavioral: Negative for depression, suicidal ideas, hallucinations, memory loss and substance abuse. The patient is not nervous/anxious and does not have insomnia.        Objective:  Blood pressure (!) 148/87, pulse (!) 102, height _0  (1.499 m), weight 150 lb (68 kg).   Physical Exam  Vitals reviewed. Constitutional: She is oriented to person, place, and time. She appears well-developed and well-nourished.  HENT:  Head: Normocephalic and atraumatic.        Right Ear: External ear normal.  Left Ear: External ear normal.  Nose: Nose normal.  Mouth/Throat: Oropharynx is clear and moist.  Eyes: Conjunctivae and EOM are normal. Pupils are equal, round, and  reactive to light. Right eye exhibits no discharge. Left eye exhibits no discharge. No scleral icterus.  Neck: Normal range of motion. Neck supple. No tracheal deviation present. No thyromegaly present.  Cardiovascular: Normal rate, regular rhythm, normal heart sounds and intact distal pulses.  Exam reveals no gallop and no friction rub.   No murmur heard. Respiratory: Effort normal and breath sounds normal. No respiratory distress. She has no wheezes. She has no rales. She exhibits no tenderness.  GI: Soft. Bowel sounds are normal. She exhibits no distension and no mass. There is no tenderness. There is no rebound and no guarding.  Genitourinary:  Breasts no masses skin changes or nipple changes bilaterally      Vulva is normal without lesions Vagina is pink moist without discharge Cervix normal in appearance and pap is done Uterus is normal size shape and contour Adnexa is negative with normal sized ovaries   Musculoskeletal: Normal range of motion. She exhibits no edema and no tenderness.  Neurological: She is alert and oriented to person, place, and time. She has normal reflexes. She displays normal reflexes. No cranial nerve deficit. She exhibits normal muscle tone. Coordination normal.  Skin: Skin is warm and dry. No rash noted. No erythema. No pallor.  Psychiatric: She has a normal mood and affect. Her behavior is normal. Judgment and thought content normal.       Medications Ordered at today's visit: No orders of the defined types were placed in this encounter.   Other orders placed at today's visit: Orders Placed This Encounter  Procedures   MM 3D SCREEN BREAST BILATERAL      Assessment:    Normal Gyn exam.   HRT Plan:    Hormone replacement therapy: estradiol + prometrium. Mammogram ordered. Follow up in: 1 year.     No follow-ups on file.

## 2022-03-16 ENCOUNTER — Encounter: Payer: Self-pay | Admitting: Obstetrics & Gynecology

## 2022-03-19 DIAGNOSIS — R682 Dry mouth, unspecified: Secondary | ICD-10-CM | POA: Diagnosis not present

## 2022-03-19 DIAGNOSIS — C021 Malignant neoplasm of border of tongue: Secondary | ICD-10-CM | POA: Diagnosis not present

## 2022-04-05 ENCOUNTER — Other Ambulatory Visit: Payer: Self-pay | Admitting: Hematology

## 2022-04-05 DIAGNOSIS — Z419 Encounter for procedure for purposes other than remedying health state, unspecified: Secondary | ICD-10-CM | POA: Diagnosis not present

## 2022-04-05 DIAGNOSIS — D696 Thrombocytopenia, unspecified: Secondary | ICD-10-CM

## 2022-04-05 DIAGNOSIS — K7460 Unspecified cirrhosis of liver: Secondary | ICD-10-CM

## 2022-04-09 ENCOUNTER — Encounter: Payer: Self-pay | Admitting: Pulmonary Disease

## 2022-04-09 ENCOUNTER — Ambulatory Visit (INDEPENDENT_AMBULATORY_CARE_PROVIDER_SITE_OTHER): Payer: Medicaid Other | Admitting: Pulmonary Disease

## 2022-04-09 VITALS — BP 140/68 | HR 79 | Temp 98.0°F | Ht 59.0 in | Wt 144.0 lb

## 2022-04-09 DIAGNOSIS — J849 Interstitial pulmonary disease, unspecified: Secondary | ICD-10-CM

## 2022-04-09 LAB — PULMONARY FUNCTION TEST
DL/VA % pred: 92 %
DL/VA: 4.12 ml/min/mmHg/L
DLCO cor % pred: 54 %
DLCO cor: 9.57 ml/min/mmHg
DLCO unc % pred: 54 %
DLCO unc: 9.57 ml/min/mmHg
FEF 25-75 Post: 2.2 L/sec
FEF 25-75 Pre: 1.95 L/sec
FEF2575-%Change-Post: 13 %
FEF2575-%Pred-Post: 90 %
FEF2575-%Pred-Pre: 79 %
FEV1-%Change-Post: -2 %
FEV1-%Pred-Post: 56 %
FEV1-%Pred-Pre: 57 %
FEV1-Post: 1.31 L
FEV1-Pre: 1.34 L
FEV1FVC-%Change-Post: 3 %
FEV1FVC-%Pred-Pre: 115 %
FEV6-%Change-Post: -5 %
FEV6-%Pred-Post: 47 %
FEV6-%Pred-Pre: 50 %
FEV6-Post: 1.37 L
FEV6-Pre: 1.44 L
FEV6FVC-%Change-Post: 0 %
FEV6FVC-%Pred-Post: 102 %
FEV6FVC-%Pred-Pre: 102 %
FVC-%Change-Post: -5 %
FVC-%Pred-Post: 46 %
FVC-%Pred-Pre: 49 %
FVC-Post: 1.37 L
FVC-Pre: 1.45 L
Post FEV1/FVC ratio: 96 %
Post FEV6/FVC ratio: 100 %
Pre FEV1/FVC ratio: 92 %
Pre FEV6/FVC Ratio: 99 %
RV % pred: 65 %
RV: 1.02 L
TLC % pred: 65 %
TLC: 2.83 L

## 2022-04-09 NOTE — Progress Notes (Signed)
Latoya Bautista    [id number]    1970/11/21  Primary Care Physician:Hall, Edwinna Areola, MD  Referring Physician: [name], MD [address] [name],  [place] [address]  Chief complaint:  Follow-up for hypersensitivity pneumonitis Exposure to birds and mold   HPI: 51 y.o.  with seizure disorder, hepatitis C, cirrhosis, tongue cancer referred for evaluation of abnormal lung imaging, dyspnea  Complains of dyspnea on exertion for the past 6 months.  No cough, sputum production, wheezing  She has history of childhood ALL with bone marrow transplant x2, whole body radiation Tongue cancer with partial glossectomy on January 18, 2021 with intermittent bleeding.  Her ENT doctor is at [facility].  Recent PET scan did not show any residual cancer but there was concern for bilateral interstitial infiltrates suggestive of interstitial lung disease.  Started on prednisone at 50 mg in the beginning of January 2023 for a CT scan showing changes consistent with hypersensitivity pneumonitis and hypersensitivity panel with positive for pigeon and mold  She got rid of the parakeets in December 2022. Finished prednisone taper in May 2023.  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  No feather pillows or comforter Smoking history: Never smoker Travel history: No significant travel history Relevant family history: No family history of lung disease  Interim history: Feels that breathing is improved.  Currently on disability and at home.  She is not exposed to cleaning chemicals at work anymore.  Outpatient Encounter Medications as of 04/09/2022  Medication Sig   cholecalciferol (VITAMIN D) 1000 units tablet Take 1,000 Units by mouth daily.   estradiol (ESTRACE) 1 MG tablet  Take 1 tablet (1 mg total) by mouth daily.   famotidine (PEPCID) 20 MG tablet Take 1 tablet (20 mg total) by mouth 2 (two) times daily.   fenofibrate (TRICOR) 145 MG tablet Take 145 mg by mouth daily.   folic acid (FOLVITE) 1 MG tablet TAKE 1 TABLET ONCE DAILY.   Garlic 1749 MG CAPS Take 1 capsule by mouth daily.   lamoTRIgine (LAMICTAL XR) 50 MG 24 hour tablet Take 3 tablets (150 mg total) by mouth 2 (two) times daily.   Multiple Vitamins-Minerals (CENTRUM PO) Take 1 tablet by mouth daily.     olmesartan (BENICAR) 20 MG tablet Take 1 tablet (20 mg total) by mouth daily.   pantoprazole (PROTONIX) 40 MG tablet Take 1 tablet (40 mg total) by mouth daily.   progesterone (PROMETRIUM) 100 MG capsule Take 1 capsule (100 mg total) by mouth daily for 15 days per month.   rosuvastatin (CRESTOR) 40 MG tablet Take 1 tablet (40 mg total) by mouth at bedtime.   sodium bicarbonate 325 MG tablet Take by mouth.   SYNTHROID 75 MCG tablet Take 1 tablet (75 mcg total) by mouth daily.   Teriparatide, Recombinant, (FORTEO) 600 MCG/2.4ML SOPN Inject into the skin daily.   vitamin C (ASCORBIC ACID) 500 MG tablet Take 500 mg by mouth daily.    Vitamin D, Ergocalciferol, (DRISDOL) 1.25 MG (50000 UNIT) CAPS capsule Take 1 capsule (50,000 Units total) by mouth every OTHER week as directed   vitamin E 400 UNIT capsule Take 400 Units by mouth 2 (two) times daily.   metoprolol  tartrate (LOPRESSOR) 100 MG tablet Take 1 tablet (100 mg total) by mouth once for 1 dose. Please take one time dose '100mg'$  metoprolol tartrate 2 hr prior to cardiac CT for HR control IF HR >55bpm.   [DISCONTINUED] denosumab (PROLIA) 60 MG/ML SOSY injection Inject 60 mg into the skin every 6 months ,to be given in the office (Patient not taking: Reported on 03/15/2022)   [DISCONTINUED] diclofenac Sodium (VOLTAREN) 1 % GEL Apply 2 g topically 4 (four) times daily. Rub into affected area of foot 2 to 4 times daily (Patient not taking: Reported on 03/15/2022)    No facility-administered encounter medications on file as of 04/09/2022.   Physical Exam: Blood pressure (!) 140/68, pulse 79, temperature 98 F (36.7 C), temperature source Oral, height '4\' 11"'$  (1.499 m), weight 144 lb (65.3 kg), SpO2 99 %. Gen:      No acute distress HEENT:  EOMI, sclera anicteric Neck:     No masses; no thyromegaly Lungs:    Clear to auscultation bilaterally; normal respiratory effort CV:         Regular rate and rhythm; no murmurs Abd:      + bowel sounds; soft, non-tender; no palpable masses, no distension Ext:    No edema; adequate peripheral perfusion Skin:      Warm and dry; no rash Neuro: alert and oriented x 3 Psych: normal mood and affect   Data Reviewed: Imaging: PET scan 12/07/2020-emphysematous changes, bilateral groundglass opacities concerning for interstitial lung disease.  High-res CT 04/10/2021-interstitial lung disease and alternate pattern.  NSIP versus chronic hypersensitivity pneumonitis, aortic and coronary atherosclerosis.  High-res CT 10/26/2021-minimal progression of pulmonary fibrosis in alternate pattern. I have reviewed the images personally.  PFTs: 03/19/2021 FVC 1.01 [34%], FEV1 0.97 [41%], F/F 96, TLC 1.82 [42%], DLCO 4.42 [25%]  04/09/2022 FVC 1.37 [46%], FEV1 1.31 [56%], F/F96, TLC 2.83 [65%], DLCO 9.57 [54%] Severe restriction,  Cardiac: Echocardiogram 07/28/2017 LVEF 53-64%, grade 1 diastolic dysfunction.  PA systolic pressure could not be accurately estimated  Labs: CTD serologies 05/03/2021-negative Hypersensitivity pneumonitis panel 04/30/2021-positive for A pullulans antibody and pigeon serum antibody  Assessment:  Hypersensitivity pneumonitis Likely from patient exposure and mold exposure given positive antibodies for A pullulans and pigeon serum.  She has gotten rid of the birds in December 2022. May have ongoing mold exposure at work as she reports presence of mold and chemicals in the old nursing home and her  symptoms are most severe when she is at work and at home.  She is avoiding work exposure by taking disability.  Finished prednisone taper in May 2023 PFTs reviewed with significant improvement in lung volumes and diffusion capacity.  Symptomatically she is feeling better Will get follow-up CT in 3 months  Plan/Recommendations: Follow-up CT in 3 months.  Marshell Garfinkel MD [facility] 04/09/2022, 1:17 PM  CC: [name], MD

## 2022-04-09 NOTE — Progress Notes (Signed)
PFT done today. 

## 2022-04-09 NOTE — Patient Instructions (Signed)
I am glad you are feeling better. Your lung function test shows some improvement Will schedule high-res CT in 3 months and follow-up in clinic after CT scan

## 2022-04-12 ENCOUNTER — Ambulatory Visit (INDEPENDENT_AMBULATORY_CARE_PROVIDER_SITE_OTHER): Payer: Medicaid Other | Admitting: Podiatry

## 2022-04-12 VITALS — BP 134/73 | HR 91

## 2022-04-12 DIAGNOSIS — L603 Nail dystrophy: Secondary | ICD-10-CM | POA: Diagnosis not present

## 2022-04-12 DIAGNOSIS — M7989 Other specified soft tissue disorders: Secondary | ICD-10-CM

## 2022-04-12 DIAGNOSIS — L600 Ingrowing nail: Secondary | ICD-10-CM

## 2022-04-12 DIAGNOSIS — M722 Plantar fascial fibromatosis: Secondary | ICD-10-CM

## 2022-04-12 NOTE — Progress Notes (Unsigned)
Subjective: Chief Complaint  Patient presents with   Plantar Fasciitis    8 week follow up - feet are doing better   Nail Problem    Right great toenail is sore, very painful at times   51 year old female presents the office with above concerns.  She said their heels are doing much better no significant pain but her main concern today is the right big toenails sore, painful.  She has not seen any drainage or pus coming from the area.  No recent treatment.   Objective: AAO x3, NAD DP/PT pulses palpable bilaterally, CRT less than 3 seconds The right hallux nail is hypertrophic, dystrophic.  Is incurvated along the nail borders.  There is a growth present along the distal lateral aspect of the nail underneath the toenail as well.  There is mild edema to the toenail but is no erythema or warmth.  No drainage or pus.  No fluctuance or crepitation. Otherwise there is no pain in the feet..  There is no female question insertional plantar fascia no area pinpoint tenderness. No pain with calf compression, swelling, warmth, erythema   Assessment: Right hallux ingrown toenail, onychodystrophy with likely benign bone growth; Plantar fasciitis  Plan: -All treatment options discussed with the patient including all alternatives, risks, complications.  -I again reviewed the x-rays with her.  I do think that there is a benign cyst in the bone growth present on the dorsal aspect of the distal phalanx which is contributing to the soft tissue mass on the right foot.  We discussed proceeding with a total nail avulsion with chemical matricectomy but discussed she may need to undergo further surgery to excise the bone growth. -Cleaned the skin with alcohol and mixture 3 mL of lidocaine, Marcaine plain was infiltrated in a regional block fashion.  Once anesthetized the skin was prepped with Betadine.  Tourniquet was applied.  I then remove the entire toenail without any complications.  On the lateral nail border  there was a soft tissue mass present and I was able to utilize a tissue nipper to debride part of this I sent this for pathology.  Able to palpate the bone growth underneath.  However there is no exposed bone.  Phenol was applied to the matrix of the toenail and irrigated with alcohol under standard technique.  Antibiotic ointment dressing applied.  Tourniquet was released To be an immediate cap refill time to the digit.  She tolerated the procedure well any complications.  Postprocedure instructions discussed.  Trula Slade DPM  -bx -may need sx -Patient encouraged to call the office with any questions, concerns, change in symptoms.

## 2022-04-12 NOTE — Patient Instructions (Signed)

## 2022-04-16 ENCOUNTER — Encounter: Payer: Self-pay | Admitting: Podiatry

## 2022-04-16 DIAGNOSIS — E782 Mixed hyperlipidemia: Secondary | ICD-10-CM | POA: Diagnosis not present

## 2022-04-16 DIAGNOSIS — E039 Hypothyroidism, unspecified: Secondary | ICD-10-CM | POA: Diagnosis not present

## 2022-04-16 DIAGNOSIS — R7301 Impaired fasting glucose: Secondary | ICD-10-CM | POA: Diagnosis not present

## 2022-04-17 NOTE — Telephone Encounter (Signed)
Noted, thanks!

## 2022-04-18 ENCOUNTER — Ambulatory Visit (INDEPENDENT_AMBULATORY_CARE_PROVIDER_SITE_OTHER): Payer: Medicaid Other | Admitting: Podiatry

## 2022-04-18 DIAGNOSIS — L600 Ingrowing nail: Secondary | ICD-10-CM

## 2022-04-18 MED ORDER — CEPHALEXIN 500 MG PO CAPS: 500.0000 mg | ORAL_CAPSULE | Freq: Three times a day (TID) | ORAL | 0 refills | Status: DC

## 2022-04-18 NOTE — Patient Instructions (Signed)
Continue soaking in epsom salts twice a day followed by antibiotic ointment and a band-aid. Can leave uncovered at night. Continue this until completely healed.  Start the antibiotic, keflex, that was sent to the pharmacy Monitor for any signs/symptoms of infection. Call the office immediately if any occur or go directly to the emergency room. Call with any questions/concerns.

## 2022-04-20 NOTE — Progress Notes (Signed)
Subjective: Chief Complaint  Patient presents with   Nail Problem    Nail check right hallux, patient states the toe is sore,    51 year old female presents the office today with the above concerns.  She states that she is having some soreness and redness at the base of the toenail from where she had it removed.  No drainage or pus.  She still soaking Epsom salts.  No fevers or chills.  No pus.  Objective: AAO x3, NAD DP/PT pulses palpable bilaterally, CRT less than 3 seconds Status post nail avulsion right hallux.  There is some localized edema and erythema at the base of the nail without any drainage or pus.  Scabbing is starting to form along the procedure site but there is still granulation tissue present.  There is no exposed bone. No pain with calf compression, swelling, warmth, erythema  Assessment: Status post nail avulsion with localized erythema  Plan: -All treatment options discussed with the patient including all alternatives, risks, complications.  -Prescribed Keflex.  Continue soaking Epsom salts, antibiotic ointment and a bandage on the day. The area open at nighttime.  Monitor any signs or symptoms worsening erythema/infection. -Patient encouraged to call the office with any questions, concerns, change in symptoms.   Trula Slade DPM

## 2022-04-23 DIAGNOSIS — E871 Hypo-osmolality and hyponatremia: Secondary | ICD-10-CM | POA: Diagnosis not present

## 2022-04-23 DIAGNOSIS — C52 Malignant neoplasm of vagina: Secondary | ICD-10-CM | POA: Diagnosis not present

## 2022-04-23 DIAGNOSIS — R7301 Impaired fasting glucose: Secondary | ICD-10-CM | POA: Diagnosis not present

## 2022-04-23 DIAGNOSIS — K7689 Other specified diseases of liver: Secondary | ICD-10-CM | POA: Diagnosis not present

## 2022-04-23 DIAGNOSIS — S61101D Unspecified open wound of right thumb with damage to nail, subsequent encounter: Secondary | ICD-10-CM | POA: Diagnosis not present

## 2022-04-23 DIAGNOSIS — B182 Chronic viral hepatitis C: Secondary | ICD-10-CM | POA: Diagnosis not present

## 2022-04-23 DIAGNOSIS — E782 Mixed hyperlipidemia: Secondary | ICD-10-CM | POA: Diagnosis not present

## 2022-04-23 DIAGNOSIS — I959 Hypotension, unspecified: Secondary | ICD-10-CM | POA: Diagnosis not present

## 2022-04-23 DIAGNOSIS — E039 Hypothyroidism, unspecified: Secondary | ICD-10-CM | POA: Diagnosis not present

## 2022-04-23 DIAGNOSIS — R000 Tachycardia, unspecified: Secondary | ICD-10-CM | POA: Diagnosis not present

## 2022-04-23 DIAGNOSIS — M810 Age-related osteoporosis without current pathological fracture: Secondary | ICD-10-CM | POA: Diagnosis not present

## 2022-05-05 ENCOUNTER — Emergency Department (HOSPITAL_COMMUNITY): Payer: Medicaid Other

## 2022-05-05 ENCOUNTER — Inpatient Hospital Stay (HOSPITAL_COMMUNITY)
Admission: EM | Admit: 2022-05-05 | Discharge: 2022-05-10 | DRG: 481 | Disposition: A | Discharge status: Skilled Nursing Facility | Payer: Medicaid Other | Attending: Student | Admitting: Student

## 2022-05-05 DIAGNOSIS — S7291XA Unspecified fracture of right femur, initial encounter for closed fracture: Secondary | ICD-10-CM | POA: Diagnosis not present

## 2022-05-05 DIAGNOSIS — J849 Interstitial pulmonary disease, unspecified: Secondary | ICD-10-CM | POA: Diagnosis not present

## 2022-05-05 DIAGNOSIS — M79604 Pain in right leg: Secondary | ICD-10-CM | POA: Diagnosis not present

## 2022-05-05 DIAGNOSIS — E785 Hyperlipidemia, unspecified: Secondary | ICD-10-CM | POA: Diagnosis present

## 2022-05-05 DIAGNOSIS — Z856 Personal history of leukemia: Secondary | ICD-10-CM

## 2022-05-05 DIAGNOSIS — Z9049 Acquired absence of other specified parts of digestive tract: Secondary | ICD-10-CM

## 2022-05-05 DIAGNOSIS — D649 Anemia, unspecified: Secondary | ICD-10-CM | POA: Diagnosis not present

## 2022-05-05 DIAGNOSIS — E871 Hypo-osmolality and hyponatremia: Secondary | ICD-10-CM | POA: Diagnosis not present

## 2022-05-05 DIAGNOSIS — Z7952 Long term (current) use of systemic steroids: Secondary | ICD-10-CM

## 2022-05-05 DIAGNOSIS — R000 Tachycardia, unspecified: Secondary | ICD-10-CM | POA: Diagnosis present

## 2022-05-05 DIAGNOSIS — K219 Gastro-esophageal reflux disease without esophagitis: Secondary | ICD-10-CM | POA: Diagnosis present

## 2022-05-05 DIAGNOSIS — Z85810 Personal history of malignant neoplasm of tongue: Secondary | ICD-10-CM | POA: Diagnosis not present

## 2022-05-05 DIAGNOSIS — D62 Acute posthemorrhagic anemia: Secondary | ICD-10-CM | POA: Diagnosis not present

## 2022-05-05 DIAGNOSIS — J8410 Pulmonary fibrosis, unspecified: Secondary | ICD-10-CM | POA: Diagnosis present

## 2022-05-05 DIAGNOSIS — W109XXA Fall (on) (from) unspecified stairs and steps, initial encounter: Secondary | ICD-10-CM | POA: Diagnosis present

## 2022-05-05 DIAGNOSIS — S72331A Displaced oblique fracture of shaft of right femur, initial encounter for closed fracture: Principal | ICD-10-CM

## 2022-05-05 DIAGNOSIS — Z9481 Bone marrow transplant status: Secondary | ICD-10-CM | POA: Diagnosis not present

## 2022-05-05 DIAGNOSIS — Z8619 Personal history of other infectious and parasitic diseases: Secondary | ICD-10-CM | POA: Diagnosis not present

## 2022-05-05 DIAGNOSIS — Z79890 Hormone replacement therapy (postmenopausal): Secondary | ICD-10-CM

## 2022-05-05 DIAGNOSIS — N289 Disorder of kidney and ureter, unspecified: Secondary | ICD-10-CM | POA: Diagnosis not present

## 2022-05-05 DIAGNOSIS — I10 Essential (primary) hypertension: Secondary | ICD-10-CM | POA: Diagnosis present

## 2022-05-05 DIAGNOSIS — G40109 Localization-related (focal) (partial) symptomatic epilepsy and epileptic syndromes with simple partial seizures, not intractable, without status epilepticus: Secondary | ICD-10-CM | POA: Diagnosis not present

## 2022-05-05 DIAGNOSIS — D696 Thrombocytopenia, unspecified: Secondary | ICD-10-CM | POA: Diagnosis present

## 2022-05-05 DIAGNOSIS — E039 Hypothyroidism, unspecified: Secondary | ICD-10-CM | POA: Diagnosis not present

## 2022-05-05 DIAGNOSIS — E875 Hyperkalemia: Secondary | ICD-10-CM | POA: Diagnosis not present

## 2022-05-05 DIAGNOSIS — M80851A Other osteoporosis with current pathological fracture, right femur, initial encounter for fracture: Secondary | ICD-10-CM | POA: Diagnosis not present

## 2022-05-05 DIAGNOSIS — Z862 Personal history of diseases of the blood and blood-forming organs and certain disorders involving the immune mechanism: Secondary | ICD-10-CM | POA: Diagnosis not present

## 2022-05-05 DIAGNOSIS — Y9222 Religious institution as the place of occurrence of the external cause: Secondary | ICD-10-CM | POA: Diagnosis not present

## 2022-05-05 DIAGNOSIS — N179 Acute kidney failure, unspecified: Secondary | ICD-10-CM | POA: Insufficient documentation

## 2022-05-05 DIAGNOSIS — Z743 Need for continuous supervision: Secondary | ICD-10-CM | POA: Diagnosis not present

## 2022-05-05 DIAGNOSIS — D0359 Melanoma in situ of other part of trunk: Secondary | ICD-10-CM | POA: Diagnosis not present

## 2022-05-05 DIAGNOSIS — Z9109 Other allergy status, other than to drugs and biological substances: Secondary | ICD-10-CM

## 2022-05-05 DIAGNOSIS — R102 Pelvic and perineal pain: Secondary | ICD-10-CM | POA: Diagnosis not present

## 2022-05-05 DIAGNOSIS — R609 Edema, unspecified: Secondary | ICD-10-CM | POA: Diagnosis not present

## 2022-05-05 DIAGNOSIS — M25551 Pain in right hip: Secondary | ICD-10-CM | POA: Diagnosis not present

## 2022-05-05 DIAGNOSIS — Z79899 Other long term (current) drug therapy: Secondary | ICD-10-CM | POA: Diagnosis not present

## 2022-05-05 DIAGNOSIS — W19XXXA Unspecified fall, initial encounter: Secondary | ICD-10-CM | POA: Diagnosis not present

## 2022-05-05 DIAGNOSIS — S72301A Unspecified fracture of shaft of right femur, initial encounter for closed fracture: Secondary | ICD-10-CM | POA: Diagnosis not present

## 2022-05-05 DIAGNOSIS — R7989 Other specified abnormal findings of blood chemistry: Secondary | ICD-10-CM | POA: Diagnosis not present

## 2022-05-05 DIAGNOSIS — M80051A Age-related osteoporosis with current pathological fracture, right femur, initial encounter for fracture: Secondary | ICD-10-CM

## 2022-05-05 DIAGNOSIS — B1920 Unspecified viral hepatitis C without hepatic coma: Secondary | ICD-10-CM | POA: Diagnosis present

## 2022-05-05 DIAGNOSIS — Z419 Encounter for procedure for purposes other than remedying health state, unspecified: Secondary | ICD-10-CM | POA: Diagnosis not present

## 2022-05-05 DIAGNOSIS — G40909 Epilepsy, unspecified, not intractable, without status epilepticus: Secondary | ICD-10-CM

## 2022-05-05 DIAGNOSIS — K7460 Unspecified cirrhosis of liver: Secondary | ICD-10-CM | POA: Diagnosis not present

## 2022-05-05 DIAGNOSIS — Z8249 Family history of ischemic heart disease and other diseases of the circulatory system: Secondary | ICD-10-CM | POA: Diagnosis not present

## 2022-05-05 DIAGNOSIS — Z888 Allergy status to other drugs, medicaments and biological substances status: Secondary | ICD-10-CM

## 2022-05-05 LAB — COMPREHENSIVE METABOLIC PANEL
ALT: 26 U/L (ref 0–44)
AST: 40 U/L (ref 15–41)
Albumin: 4.2 g/dL (ref 3.5–5.0)
Alkaline Phosphatase: 79 U/L (ref 38–126)
Anion gap: 9 (ref 5–15)
BUN: 12 mg/dL (ref 6–20)
CO2: 23 mmol/L (ref 22–32)
Calcium: 9.8 mg/dL (ref 8.9–10.3)
Chloride: 99 mmol/L (ref 98–111)
Creatinine, Ser: 1.31 mg/dL — ABNORMAL HIGH (ref 0.44–1.00)
GFR, Estimated: 49 mL/min — ABNORMAL LOW (ref >60)
Glucose, Bld: 120 mg/dL — ABNORMAL HIGH (ref 70–99)
Potassium: 4.5 mmol/L (ref 3.5–5.1)
Sodium: 131 mmol/L — ABNORMAL LOW (ref 135–145)
Total Bilirubin: 0.4 mg/dL (ref 0.3–1.2)
Total Protein: 7.3 g/dL (ref 6.5–8.1)

## 2022-05-05 LAB — CBC WITH DIFFERENTIAL/PLATELET
Abs Immature Granulocytes: 0.06 10*3/uL (ref 0.00–0.07)
Basophils Absolute: 0.1 10*3/uL (ref 0.0–0.1)
Basophils Relative: 1 %
Eosinophils Absolute: 0.1 10*3/uL (ref 0.0–0.5)
Eosinophils Relative: 1 %
HCT: 38.6 % (ref 36.0–46.0)
Hemoglobin: 12.9 g/dL (ref 12.0–15.0)
Immature Granulocytes: 1 %
Lymphocytes Relative: 26 %
Lymphs Abs: 2.2 10*3/uL (ref 0.7–4.0)
MCH: 33.2 pg (ref 26.0–34.0)
MCHC: 33.4 g/dL (ref 30.0–36.0)
MCV: 99.2 fL (ref 80.0–100.0)
Monocytes Absolute: 0.9 10*3/uL (ref 0.1–1.0)
Monocytes Relative: 11 %
Neutro Abs: 5.1 10*3/uL (ref 1.7–7.7)
Neutrophils Relative %: 60 %
Platelets: 210 10*3/uL (ref 150–400)
RBC: 3.89 MIL/uL (ref 3.87–5.11)
RDW: 12.2 % (ref 11.5–15.5)
WBC: 8.5 10*3/uL (ref 4.0–10.5)
nRBC: 0 % (ref 0.0–0.2)

## 2022-05-05 LAB — TYPE AND SCREEN
ABO/RH(D): O NEG
Antibody Screen: NEGATIVE

## 2022-05-05 MED ORDER — IRBESARTAN 150 MG PO TABS
75.0000 mg | ORAL_TABLET | Freq: Every day | ORAL | Status: DC
  Administered 2022-05-06: 75 mg via ORAL
  Filled 2022-05-05: qty 1

## 2022-05-05 MED ORDER — SODIUM BICARBONATE 325 MG PO TABS: 150.0000 mg | ORAL_TABLET | Freq: Every day | ORAL | Status: DC

## 2022-05-05 MED ORDER — LEVOTHYROXINE SODIUM 75 MCG PO TABS
75.0000 ug | ORAL_TABLET | Freq: Every day | ORAL | Status: DC
  Administered 2022-05-06 – 2022-05-09 (×4): 75 ug via ORAL
  Filled 2022-05-05 (×4): qty 1

## 2022-05-05 MED ORDER — ESTRADIOL 0.5 MG PO TABS
1.0000 mg | ORAL_TABLET | Freq: Every day | ORAL | Status: DC
  Administered 2022-05-07 – 2022-05-10 (×4): 1 mg via ORAL
  Filled 2022-05-05 (×5): qty 2

## 2022-05-05 MED ORDER — ONDANSETRON HCL 4 MG/2ML IJ SOLN: 4.0000 mg | Freq: Four times a day (QID) | INTRAMUSCULAR | Status: DC | PRN

## 2022-05-05 MED ORDER — FOLIC ACID 1 MG PO TABS
1.0000 mg | ORAL_TABLET | Freq: Every day | ORAL | Status: DC
  Administered 2022-05-06 – 2022-05-10 (×5): 1 mg via ORAL
  Filled 2022-05-05 (×6): qty 1

## 2022-05-05 MED ORDER — ENOXAPARIN SODIUM 40 MG/0.4ML IJ SOSY
40.0000 mg | PREFILLED_SYRINGE | INTRAMUSCULAR | Status: DC
  Administered 2022-05-05 – 2022-05-09 (×5): 40 mg via SUBCUTANEOUS
  Filled 2022-05-05 (×5): qty 0.4

## 2022-05-05 MED ORDER — METOPROLOL TARTRATE 50 MG PO TABS
50.0000 mg | ORAL_TABLET | Freq: Two times a day (BID) | ORAL | Status: DC
  Administered 2022-05-05 – 2022-05-09 (×8): 50 mg via ORAL
  Filled 2022-05-05 (×2): qty 1
  Filled 2022-05-05: qty 2
  Filled 2022-05-05 (×6): qty 1

## 2022-05-05 MED ORDER — ACETAMINOPHEN 650 MG RE SUPP: 650.0000 mg | Freq: Four times a day (QID) | RECTAL | Status: DC | PRN

## 2022-05-05 MED ORDER — MORPHINE SULFATE (PF) 4 MG/ML IV SOLN
4.0000 mg | Freq: Once | INTRAVENOUS | Status: AC
  Administered 2022-05-05: 4 mg via INTRAVENOUS
  Filled 2022-05-05: qty 1

## 2022-05-05 MED ORDER — FAMOTIDINE 20 MG PO TABS
20.0000 mg | ORAL_TABLET | Freq: Every day | ORAL | Status: DC
  Administered 2022-05-06 – 2022-05-10 (×5): 20 mg via ORAL
  Filled 2022-05-05 (×6): qty 1

## 2022-05-05 MED ORDER — LAMOTRIGINE ER 50 MG PO TB24
150.0000 mg | ORAL_TABLET | Freq: Two times a day (BID) | ORAL | Status: DC
  Administered 2022-05-06 – 2022-05-10 (×10): 150 mg via ORAL
  Filled 2022-05-05 (×11): qty 1

## 2022-05-05 MED ORDER — TERIPARATIDE 600 MCG/2.4ML ~~LOC~~ SOPN: PEN_INJECTOR | Freq: Every day | SUBCUTANEOUS | Status: DC

## 2022-05-05 MED ORDER — ACETAMINOPHEN 325 MG PO TABS: 650.0000 mg | ORAL_TABLET | Freq: Four times a day (QID) | ORAL | Status: DC | PRN

## 2022-05-05 MED ORDER — ROSUVASTATIN CALCIUM 20 MG PO TABS
40.0000 mg | ORAL_TABLET | Freq: Every day | ORAL | Status: DC
  Administered 2022-05-05 – 2022-05-10 (×6): 40 mg via ORAL
  Filled 2022-05-05 (×6): qty 2

## 2022-05-05 MED ORDER — ONDANSETRON HCL 4 MG PO TABS: 4.0000 mg | ORAL_TABLET | Freq: Four times a day (QID) | ORAL | Status: DC | PRN

## 2022-05-05 MED ORDER — VITAMIN D (ERGOCALCIFEROL) 1.25 MG (50000 UNIT) PO CAPS
50000.0000 [IU] | ORAL_CAPSULE | ORAL | Status: DC
  Filled 2022-05-05: qty 1

## 2022-05-05 MED ORDER — FENOFIBRATE 160 MG PO TABS
160.0000 mg | ORAL_TABLET | Freq: Every day | ORAL | Status: DC
  Administered 2022-05-06 – 2022-05-10 (×5): 160 mg via ORAL
  Filled 2022-05-05 (×5): qty 1

## 2022-05-05 MED ORDER — PANTOPRAZOLE SODIUM 40 MG PO TBEC
40.0000 mg | DELAYED_RELEASE_TABLET | Freq: Every day | ORAL | Status: DC
  Administered 2022-05-06 – 2022-05-10 (×5): 40 mg via ORAL
  Filled 2022-05-05 (×6): qty 1

## 2022-05-05 MED ORDER — FENTANYL CITRATE PF 50 MCG/ML IJ SOSY
50.0000 ug | PREFILLED_SYRINGE | Freq: Once | INTRAMUSCULAR | Status: AC
  Administered 2022-05-05: 50 ug via INTRAVENOUS
  Filled 2022-05-05: qty 1

## 2022-05-05 MED ORDER — SENNOSIDES-DOCUSATE SODIUM 8.6-50 MG PO TABS: 1.0000 | ORAL_TABLET | Freq: Every evening | ORAL | Status: DC | PRN

## 2022-05-05 MED ORDER — PROGESTERONE MICRONIZED 100 MG PO CAPS
100.0000 mg | ORAL_CAPSULE | Freq: Every day | ORAL | Status: DC
  Administered 2022-05-06 – 2022-05-10 (×5): 100 mg via ORAL
  Filled 2022-05-05 (×5): qty 1

## 2022-05-05 MED ORDER — MORPHINE SULFATE (PF) 2 MG/ML IV SOLN
2.0000 mg | Freq: Once | INTRAVENOUS | Status: AC
  Administered 2022-05-05: 2 mg via INTRAMUSCULAR

## 2022-05-05 NOTE — ED Notes (Signed)
The 24 iv in her rtr hand no longer works  iv team called to attempt another iv
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
## 2022-05-05 NOTE — H&P (Signed)
PCP:   [name], MD   Chief Complaint:  Fall/right hip pain  HPI: This is a 51 year old female with past medical history of leukemia, tongue cancer, liver cirrhosis, pulmonary fibrosis, seizures, hepatitis C and hypothyroidism.  She has been maintained on chronic steroids until recently.  Today she was leaving church when she missed stepped and fell landing on her right side.  She met that they could not feel her left foot and was in immediate pain.  EMS was called.  She was brought to the ER.  In the ER imaging confirmed oblique fracture through the proximal diaphysis of the right femur.  Ortho seen patient.  Hospitalist asked to admit.  Review of Systems:  The patient denies anorexia, fever, weight loss,, vision loss, decreased hearing, hoarseness, chest pain, syncope, dyspnea on exertion, peripheral edema, balance deficits, hemoptysis, abdominal pain, melena, hematochezia, severe indigestion/heartburn, hematuria, incontinence, genital sores, muscle weakness, suspicious skin lesions, transient blindness, difficulty walking, depression, unusual weight change, abnormal bleeding, enlarged lymph nodes, angioedema, and breast masses. Positive: Fall, right hip pain  Past Medical History: Past Medical History:  Diagnosis Date   Anemia    Cancer ([place])    tongue cancer   H/O echocardiogram    a. 07/2017: echo showing EF of 55-60%, Grade 1 DD, and no significant valve abnormalities.    Hepatitis C    Hepatitis C 04/11/2011   Leukemia ([place])    Liver cirrhosis ([place]) 04/11/2011   Osteoporosis    Pulmonary fibrosis (HCC)    Seizures (HCC)    Shingles    Thrombocytopenia ([place]) 04/11/2011   Thyroid disease    Past Surgical History:  Procedure Laterality Date   BONE MARROW BIOPSY  05/07/2011   BONE MARROW TRANSPLANT     BUNIONECTOMY     CHOLECYSTECTOMY     liver biopsie     TONGUE BIOPSY     tongue cancer     surgical removal of area    Medications: Prior to Admission medications    Medication Sig Start Date End Date Taking? Authorizing Provider  cephALEXin (KEFLEX) 500 MG capsule Take 1 capsule (500 mg total) by mouth 3 (three) times daily. 04/18/22   Trula Slade, DPM  cholecalciferol (VITAMIN D) 1000 units tablet Take 1,000 Units by mouth daily.    [provider]  estradiol (ESTRACE) 1 MG tablet Take 1 tablet (1 mg total) by mouth daily. 01/08/22   Estill Dooms, NP  famotidine (PEPCID) 20 MG tablet Take 1 tablet (20 mg total) by mouth 2 (two) times daily. 11/21/21     fenofibrate (TRICOR) 145 MG tablet Take 145 mg by mouth daily. 02/04/22   [provider]  folic acid (FOLVITE) 1 MG tablet TAKE 1 TABLET ONCE DAILY. 04/05/22   Derek Jack, MD  Garlic 0973 MG CAPS Take 1 capsule by mouth daily.    [provider]  lamoTRIgine (LAMICTAL XR) 50 MG 24 hour tablet Take 3 tablets (150 mg total) by mouth 2 (two) times daily. 08/24/21     metoprolol tartrate (LOPRESSOR) 100 MG tablet Take 1 tablet (100 mg total) by mouth once for 1 dose. Please take one time dose 126m metoprolol tartrate 2 hr prior to cardiac CT for HR control IF HR >55bpm. 01/14/22 05/05/22  BArnoldo Lenis MD  Multiple Vitamins-Minerals (CENTRUM PO) Take 1 tablet by mouth daily.      [provider]  olmesartan (BENICAR) 20 MG tablet Take 1 tablet (20 mg total) by  mouth daily. 10/22/21     pantoprazole (PROTONIX) 40 MG tablet Take 1 tablet (40 mg total) by mouth daily. 07/02/21     progesterone (PROMETRIUM) 100 MG capsule Take 1 capsule (100 mg total) by mouth daily for 15 days per month. 01/08/22   Estill Dooms, NP  rosuvastatin (CRESTOR) 40 MG tablet Take 1 tablet (40 mg total) by mouth at bedtime. 07/20/21     sodium bicarbonate 325 MG tablet Take by mouth.    [provider]  SYNTHROID 75 MCG tablet Take 1 tablet (75 mcg total) by mouth daily. 07/20/21     Teriparatide, Recombinant, (FORTEO) 600 MCG/2.4ML SOPN Inject into the skin daily.     [provider]  vitamin C (ASCORBIC ACID) 500 MG tablet Take 500 mg by mouth daily.  07/18/10   [provider]  Vitamin D, Ergocalciferol, (DRISDOL) 1.25 MG (50000 UNIT) CAPS capsule Take 1 capsule (50,000 Units total) by mouth every OTHER week as directed 03/16/21     vitamin E 400 UNIT capsule Take 400 Units by mouth 2 (two) times daily.    [provider]    Allergies:   Allergies  Allergen Reactions   Simvastatin Rash   Briviact [Brivaracetam]     Elevated LFT's   Asparaginase Derivatives Rash   Elspar [Asparaginase] Rash   Keppra [Levetiracetam] Rash   Soap Rash    Procedure prep soap   Tape Rash and Other (See Comments)    Redness from the tape    Social History:  reports that she has never smoked. She has never used smokeless tobacco. She reports that she does not drink alcohol and does not use drugs.  Family History: Family History  Problem Relation Age of Onset   Hypertension Father    Diabetes Maternal Uncle     Physical Exam: Vitals:   05/05/22 1844 05/05/22 1849 05/05/22 1849  BP:   (!) 160/82  Pulse:   (!) 110  Resp:   14  Temp: 97.8 F (36.6 C)  97.8 F (36.6 C)  TempSrc: Oral  Oral  SpO2:   98%  Weight:  63.5 kg   Height:  _0  (1.499 m)     General:  Alert and oriented times three, well developed and nourished, no acute distress Eyes: PERRLA, pink conjunctiva, no scleral icterus ENT: Moist oral mucosa, neck supple, no thyromegaly Lungs: clear to ascultation, no wheeze, no crackles, no use of accessory muscles Cardiovascular: regular rate and rhythm, no regurgitation, no gallops, no murmurs. No carotid bruits, no JVD Abdomen: soft, positive BS, non-tender, non-distended, no organomegaly, not an acute abdomen GU: not examined Neuro: CN II - XII grossly intact, sensation intact Musculoskeletal: strength 5/5 all extremities, no clubbing, cyanosis or edema, right great toe nail avulsed.  No signs of infection.  Right hip  pain Skin: no rash, no subcutaneous crepitation, no decubitus Psych: appropriate patient   Labs on Admission:   Recent Labs    05/05/22 1858  WBC 8.5  NEUTROABS 5.1  HGB 12.9  HCT 38.6  MCV 99.2  PLT 210    Radiological Exams on Admission: DG FEMUR, MIN 2 VIEWS RIGHT  Result Date: 05/05/2022 CLINICAL DATA:  Recent fall downstairs with leg pain and deformity, initial encounter EXAM: RIGHT FEMUR 2 VIEWS COMPARISON:  None Available. FINDINGS: Oblique fracture is noted through the proximal diaphysis. Distal fracture fragment is displaced one bone width laterally and posteriorly. No other fractures are seen. No soft tissue abnormality is noted.  IMPRESSION: Oblique fracture through the proximal diaphysis as described. Electronically Signed   By: Inez Catalina M.D.   On: 05/05/2022 19:19   DG Pelvis Portable  Result Date: 05/05/2022 CLINICAL DATA:  Recent fall downstairs with right leg pain, initial encounter EXAM: PORTABLE PELVIS 1 VIEWS COMPARISON:  None Available. FINDINGS: Pelvic ring is intact. The femoral heads are well seated. There is an oblique fracture through the midshaft of the right femur not completely visualized on this exam. No soft tissue abnormality is noted. IMPRESSION: Oblique fracture through the midshaft of the right femur incompletely evaluated on this study. Electronically Signed   By: Inez Catalina M.D.   On: 05/05/2022 19:18    Assessment/Plan Present on Admission:  Right hip fracture -Admit to MedSurg -N.p.o. at midnight -IV pain meds as needed -IV fluid hydration -PT/INR in a.m. -Orthopedics on board  Hypothyroidism -Stable, Synthroid resumed  Dyslipidemia -Tricor and Crestor resumed  Hypertension -Lopressor and olmesartan resumed  Seizure -Lamictal resumed  GERD -Protonix resumed  History of hyponatremia -Sodium bicarb tablets resume  Osteoporosis -Forteo and vitamin D resume   ILD (interstitial lung disease) (HCC) -Nebulizers as  needed.    Hepatitis C -Treated   Liver cirrhosis (HCC)  Melanoma in situ of skin of buttock ([place])   Ephram Kornegay 05/05/2022, 8:29 PM

## 2022-05-05 NOTE — Progress Notes (Signed)
Orthopedic Tech Progress Note Patient Details:  Latoya Bautista [birth year] [id number]  Musculoskeletal Traction Type of Traction: [place] Skin Traction Traction Location: rle Traction Weight: 15 lbs   Post Interventions Patient Tolerated: Well Instructions Provided: Care of device, Adjustment of device  [name] 05/05/2022, 10:46 PM

## 2022-05-05 NOTE — ED Notes (Signed)
Unable to inject in the number 24 iv placed in her rt hand

## 2022-05-05 NOTE — ED Notes (Signed)
Amanda TRN at bedside trying to get IV access due to unsuccessful earlier attempts

## 2022-05-05 NOTE — ED Notes (Signed)
Pt requesting pain med 

## 2022-05-05 NOTE — ED Notes (Signed)
Trauma Response Nurse Documentation  Latoya Bautista is a 51 y.o. female arriving to [facility] ED via EMS  Trauma was activated as a Level 2 based on the following trauma criteria Stable femur, humerus, or pelvic fracture via any mechanism except GLF. Trauma team at the bedside on patient arrival.   Patient cleared for CT by Dr. Ronnald Nian. Pt transported to Xray with trauma response nurse present to monitor. RN remained with the patient throughout their absence from the department for clinical observation. GCS 15.  History   Past Medical History:  Diagnosis Date   Anemia    Cancer ([place])    tongue cancer   H/O echocardiogram    a. 07/2017: echo showing EF of 55-60%, Grade 1 DD, and no significant valve abnormalities.    Hepatitis C    Hepatitis C 04/11/2011   Leukemia ([place])    Liver cirrhosis ([place]) 04/11/2011   Osteoporosis    Pulmonary fibrosis (HCC)    Seizures (HCC)    Shingles    Thrombocytopenia ([place]) 04/11/2011   Thyroid disease      Past Surgical History:  Procedure Laterality Date   BONE MARROW BIOPSY  05/07/2011   BONE MARROW TRANSPLANT     BUNIONECTOMY     CHOLECYSTECTOMY     liver biopsie     TONGUE BIOPSY     tongue cancer     surgical removal of area     Initial Focused Assessment (If applicable, or please see trauma documentation): Patient A&Ox4, GCS 15 Per patient she missed a step and tripped causing her leg to go up under her, immediate pain to R leg Pulses 2+ No LOC, did not hit head, no other pain/injury identified  CT's Completed:   none   Interventions:  IV, labs XR R Femur  Plan for disposition:  Admission to floor   Consults completed:  Orthopaedic Surgeon at see notes.  Event Summary: Patient to ED after falling coming out of church. Per patient she missed a step and fell onto her right leg. Immediate pain to leg, unable to straighten. XR revealed R femur fx. Ortho consulted and recommend surgery. Patient placed in traction overnight  and plan for IM nailing 1/1.  Bedside handoff with ED RN [place].    Park Pope Julie-Anne Torain  Trauma Response RN  Please call TRN at [phone] for further assistance.

## 2022-05-05 NOTE — ED Provider Notes (Signed)
[facility] EMERGENCY DEPARTMENT Provider Note   CSN: [id number] Arrival date & time: 05/05/22  1840     History  Chief Complaint  Patient presents with   Fall    Level 2    Latoya Bautista is a 51 y.o. female.  With past medical history of cirrhosis, childhood leukemia, pulmonary fibrosis who presents to the emergency department as a level 2 trauma after a fall.  Patient states that she was visiting a church with her pastor when they were going down 2 stairs.  She states that she fell and missed the last stair falling onto her right side.  She states that her right hip and leg landed under her.  Had immediate pain to her leg.  She denies striking her head or loss of consciousness.  Denies pain to the chest, abdomen, left leg or back.  She is not anticoagulated.   Fall       Home Medications Prior to Admission medications   Medication Sig Start Date End Date Taking? Authorizing Provider  metoprolol tartrate (LOPRESSOR) 50 MG tablet Take 50 mg by mouth 2 (two) times daily.   Yes [provider]  olmesartan (BENICAR) 20 MG tablet Take 1 tablet (20 mg total) by mouth daily. Patient taking differently: Take 20 mg by mouth every evening. 10/22/21  Yes   pantoprazole (PROTONIX) 40 MG tablet Take 1 tablet (40 mg total) by mouth daily. Patient taking differently: Take 40 mg by mouth daily before breakfast. 07/02/21  Yes   cephALEXin (KEFLEX) 500 MG capsule Take 1 capsule (500 mg total) by mouth 3 (three) times daily. 04/18/22   Trula Slade, DPM  cholecalciferol (VITAMIN D) 1000 units tablet Take 1,000 Units by mouth daily.    [provider]  estradiol (ESTRACE) 1 MG tablet Take 1 tablet (1 mg total) by mouth daily. 01/08/22   Estill Dooms, NP  famotidine (PEPCID) 20 MG tablet Take 1 tablet (20 mg total) by mouth 2 (two) times daily. 11/21/21     fenofibrate (TRICOR) 145 MG tablet Take 145 mg by mouth daily. 02/04/22   [provider]  folic acid (FOLVITE) 1 MG tablet TAKE 1 TABLET ONCE DAILY. 04/05/22   Derek Jack, MD  Garlic 6945 MG CAPS Take 1 capsule by mouth daily.    [provider]  lamoTRIgine (LAMICTAL XR) 50 MG 24 hour tablet Take 3 tablets (150 mg total) by mouth 2 (two) times daily. 08/24/21     metoprolol tartrate (LOPRESSOR) 100 MG tablet Take 1 tablet (100 mg total) by mouth once for 1 dose. Please take one time dose '100mg'$  metoprolol tartrate 2 hr prior to cardiac CT for HR control IF HR >55bpm. Patient not taking: Reported on 05/05/2022 01/14/22 05/05/22  Arnoldo Lenis, MD  Multiple Vitamins-Minerals (CENTRUM PO) Take 1 tablet by mouth daily.      [provider]  progesterone (PROMETRIUM) 100 MG capsule Take 1 capsule (100 mg total) by mouth daily for 15 days per month. 01/08/22   Estill Dooms, NP  rosuvastatin (CRESTOR) 40 MG tablet Take 1 tablet (40 mg total) by mouth at bedtime. 07/20/21     sodium bicarbonate 325 MG tablet Take by mouth.    [provider]  SYNTHROID 75 MCG tablet Take 1 tablet (75 mcg total) by mouth daily. 07/20/21     Teriparatide, Recombinant, (FORTEO) 600 MCG/2.4ML SOPN Inject into the skin daily.    [provider]  vitamin  C (ASCORBIC ACID) 500 MG tablet Take 500 mg by mouth daily.  07/18/10   [provider]  Vitamin D, Ergocalciferol, (DRISDOL) 1.25 MG (50000 UNIT) CAPS capsule Take 1 capsule (50,000 Units total) by mouth every OTHER week as directed 03/16/21     vitamin E 400 UNIT capsule Take 400 Units by mouth 2 (two) times daily.    [provider]      Allergies    Simvastatin, Briviact [brivaracetam], Asparaginase derivatives, Elspar [asparaginase], Keppra [levetiracetam], Soap, and Tape    Review of Systems   Review of Systems  Musculoskeletal:  Positive for arthralgias, gait problem and joint swelling.  All other systems reviewed and are negative.   Physical Exam Updated Vital Signs BP (!)  160/82   Pulse (!) 110   Temp 97.8 F (36.6 C) (Oral)   Resp 14   Ht '4\' 11"'$  (1.499 m)   Wt 63.5 kg   SpO2 98%   BMI 28.28 kg/m  Physical Exam Vitals and nursing note reviewed.  Constitutional:      General: She is not in acute distress.    Appearance: She is ill-appearing.  HENT:     Head: Normocephalic and atraumatic.     Nose: Nose normal.     Mouth/Throat:     Mouth: Mucous membranes are moist.     Pharynx: Oropharynx is clear.  Eyes:     General: No scleral icterus.    Extraocular Movements: Extraocular movements intact.     Pupils: Pupils are equal, round, and reactive to light.  Cardiovascular:     Rate and Rhythm: Normal rate and regular rhythm.     Pulses: Normal pulses.     Heart sounds: No murmur heard. Pulmonary:     Effort: Pulmonary effort is normal. No respiratory distress.     Breath sounds: Normal breath sounds.  Chest:     Chest wall: No tenderness.  Abdominal:     General: Bowel sounds are normal. There is no distension.     Palpations: Abdomen is soft.     Tenderness: There is no abdominal tenderness.  Musculoskeletal:     Cervical back: Normal range of motion and neck supple. No rigidity or tenderness.     Comments: The right leg is in a brace  Skin:    General: Skin is warm and dry.     Capillary Refill: Capillary refill takes less than 2 seconds.     Findings: Bruising present.  Neurological:     General: No focal deficit present.     Mental Status: She is alert and oriented to person, place, and time. Mental status is at baseline.  Psychiatric:        Mood and Affect: Mood normal.        Behavior: Behavior normal.        Thought Content: Thought content normal.        Judgment: Judgment normal.     ED Results / Procedures / Treatments   Labs (all labs ordered are listed, but only abnormal results are displayed) Labs Reviewed  CBC WITH DIFFERENTIAL/PLATELET  COMPREHENSIVE METABOLIC PANEL  TYPE AND SCREEN  ABO/RH     EKG None  Radiology DG FEMUR, MIN 2 VIEWS RIGHT  Result Date: 05/05/2022 CLINICAL DATA:  Recent fall downstairs with leg pain and deformity, initial encounter EXAM: RIGHT FEMUR 2 VIEWS COMPARISON:  None Available. FINDINGS: Oblique fracture is noted through the proximal diaphysis. Distal fracture fragment is displaced one bone width laterally  and posteriorly. No other fractures are seen. No soft tissue abnormality is noted. IMPRESSION: Oblique fracture through the proximal diaphysis as described. Electronically Signed   By: Inez Catalina M.D.   On: 05/05/2022 19:19   DG Pelvis Portable  Result Date: 05/05/2022 CLINICAL DATA:  Recent fall downstairs with right leg pain, initial encounter EXAM: PORTABLE PELVIS 1 VIEWS COMPARISON:  None Available. FINDINGS: Pelvic ring is intact. The femoral heads are well seated. There is an oblique fracture through the midshaft of the right femur not completely visualized on this exam. No soft tissue abnormality is noted. IMPRESSION: Oblique fracture through the midshaft of the right femur incompletely evaluated on this study. Electronically Signed   By: Inez Catalina M.D.   On: 05/05/2022 19:18    Procedures .Critical Care  Performed by: Mickie Hillier, PA-C Authorized by: Mickie Hillier, PA-C   Critical care provider statement:    Critical care time (minutes):  35   Critical care time was exclusive of:  Separately billable procedures and treating other patients   Critical care was necessary to treat or prevent imminent or life-threatening deterioration of the following conditions:  Trauma   Critical care was time spent personally by me on the following activities:  Development of treatment plan with patient or surrogate, discussions with consultants, discussions with primary provider, evaluation of patient's response to treatment, examination of patient, review of old charts, re-evaluation of patient's condition, pulse oximetry, ordering and review of  radiographic studies, ordering and review of laboratory studies and ordering and performing treatments and interventions   I assumed direction of critical care for this patient from another provider in my specialty: no     Care discussed with: admitting provider       Medications Ordered in ED Medications  fentaNYL (SUBLIMAZE) injection 50 mcg (50 mcg Intravenous Given 05/05/22 1908)  morphine (PF) 4 MG/ML injection 4 mg (4 mg Intravenous Given 05/05/22 2029)    ED Course/ Medical Decision Making/ A&P Clinical Course as of 05/05/22 2035  Sun May 05, 2022  1926 Consulted with Haddix, MD orthopedic surgery. Recommends hospitalist admission. Will see her for operative management  [LA]    Clinical Course User Index [LA] Mickie Hillier, PA-C                           Medical Decision Making Amount and/or Complexity of Data Reviewed Labs: ordered. Radiology: ordered.  Risk Prescription drug management. Decision regarding hospitalization.  Initial Impression and Ddx 51 year old female who presents to the emergency department as a level 2 trauma after falling down 1 stair and landing on her right leg.  She states that she had felt a snap at that time.  On arrival she is in traction via EMS to the right leg.  There is a deformity to the thigh.  She is tender to the thigh.  DP pulses palpable.  Sensation is intact.  She is able to wiggle her toes.  Will obtain portable pelvis and femur. Patient PMH that increases complexity of ED encounter: Cirrhosis, pulmonary fibrosis  Interpretation of Diagnostics I independent reviewed and interpreted the labs as followed: CBC, CMP, type and screen are pending  - I independently visualized the following imaging with scope of interpretation limited to determining acute life threatening conditions related to emergency care: Plain film of the right femur, which revealed oblique fracture through the proximal diaphysis.  Pelvis is stable  Patient  Reassessment and  Ultimate Disposition/Management The primary and secondary trauma survey were performed.  The patient was completely undressed and placed into a gown. On my initial arrival she is alert and oriented.  She is a GCS of 15.  Is intact.  She has equal chest rise and fall.  Her pulses are equal bilaterally.  No obvious external signs of hemorrhage. On the secondary survey notably she has a deformity to the right thigh.  The leg is in traction.  Neurovascularly intact. Pelvis is stable.  No other abnormalities found. Imaging was performed which shows a femur fracture. Orthopedic surgery was consulted and I spoke with Dr. [name] who will come see the patient and recommends hospitalist admission. Hospitalist, Dr. Claria Dice admitted the patient.  Patient management required discussion with the following services or consulting groups:  Hospitalist Service and Orthopedic Surgery  Complexity of Problems Addressed Acute complicated illness or Injury  Additional Data Reviewed and Analyzed Further history obtained from: EMS on arrival, Past medical history and medications listed in the EMR, Care Everywhere, and Prior labs/imaging results  Patient Encounter Risk Assessment Use of parenteral controlled substances, Consideration of hospitalization, and Major procedures  Final Clinical Impression(s) / ED Diagnoses Final diagnoses:  Closed displaced oblique fracture of shaft of right femur, initial encounter [facility])    Rx / DC Orders ED Discharge Orders     None         Mickie Hillier, PA-C 05/05/22 2036    Lennice Sites, DO 05/06/22 2103

## 2022-05-05 NOTE — Consult Note (Cosign Needed Addendum)
Orthopaedic Trauma Service (OTS) Consult   Patient ID: Latoya Bautista MRN: [id number] DOB/AGE: 51-Jan-1972 51 y.o.  Reason for Consult: Right femur fracture Referring Physician: Dr. Lennice Sites, MD [facility] [place])  HPI: Latoya Bautista is an 51 y.o. female with past medical history significant for hypertension, hypothyroidism, seizures, osteoporosis being seen in consultation at the request of Dr. Ronnald Nian for right femur fracture.  Patient states that she was walking out of church earlier tonight and missed the bottom step.  She fell, landing on the right lower extremity.  Had immediate pain in the right leg and was unable to weight-bear.  Was brought to [facility] emergency department for evaluation.  Found to have right femur fracture.  Orthopedics consulted for evaluation and management. Patient seen this evening in ED room 026.  Pain is currently well-controlled at rest.  Denies any significant numbness or tingling throughout the extremity.  Denies any other injury from her fall.  Does note that she has had bilateral foot fractures in the past which have required surgical intervention but otherwise denies any previous injury or surgery to the right lower extremity.  Ambulates with no assistive device at baseline.  Denies any anticoagulation use.  Denies any tobacco use.  Her husband is at bedside.  Past Medical History:  Diagnosis Date   Anemia    Cancer ([place])    tongue cancer   H/O echocardiogram    a. 07/2017: echo showing EF of 55-60%, Grade 1 DD, and no significant valve abnormalities.    Hepatitis C    Hepatitis C 04/11/2011   Leukemia ([place])    Liver cirrhosis ([place]) 04/11/2011   Osteoporosis    Pulmonary fibrosis ([facility])    Seizures ([facility])    Shingles    Thrombocytopenia ([place]) 04/11/2011   Thyroid disease     Past Surgical History:  Procedure Laterality Date   BONE MARROW BIOPSY  05/07/2011   BONE MARROW TRANSPLANT     BUNIONECTOMY     CHOLECYSTECTOMY     liver biopsie      TONGUE BIOPSY     tongue cancer     surgical removal of area    Family History  Problem Relation Age of Onset   Hypertension Father    Diabetes Maternal Uncle     Social History:  reports that she has never smoked. She has never used smokeless tobacco. She reports that she does not drink alcohol and does not use drugs.  Allergies:  Allergies  Allergen Reactions   Simvastatin Rash   Briviact [Brivaracetam]     Elevated LFT's   Keppra [Levetiracetam]     Rash   Asparaginase Derivatives Rash   Elspar [Asparaginase] Rash   Soap Rash    Procedure prep soap    Medications: I have reviewed the patient's current medications. Prior to Admission: (Not in a hospital admission)   ROS: Constitutional: No fever or chills Vision: No changes in vision ENT: No difficulty swallowing CV: No chest pain Pulm: No SOB or wheezing GI: No nausea or vomiting GU: No urgency or inability to hold urine Skin: No poor wound healing Neurologic: No numbness or tingling Psychiatric: No depression or anxiety Heme: No bruising Allergic: No reaction to medications or food   Exam: Blood pressure (!) 160/82, pulse (!) 110, temperature 97.8 F (36.6 C), temperature source Oral, resp. rate 14, height _0  (1.499 m), weight 63.5 kg, SpO2 98 %. General: Sitting up in bed, no acute distress Orientation: Alert and  oriented x 4 Mood and Affect: Affect appropriate, pleasant and cooperative Gait: Not assessed due to known fracture Coordination and balance: Thin normal limits  Right lower extremity: Well-healed surgical scar over the great toe.  Otherwise skin without lesions.  Tenderness throughout the thigh as expected.  Swelling to the thigh mild to moderate.  Compartments easily compressible.  Nontender throughout the lower leg.  Did not attempt any hip or knee motion due to known fracture.  Ankle DF/PF intact.  Dors is sensation that extremity.  Neurovascularly intact.  Left lower extremity: Skin  without lesions. No tenderness to palpation. Full painless ROM, full strength in each muscle groups without evidence of instability.  There are and sensory function at baseline.  Neurovascularly intact   Medical Decision Making: Data: Imaging: AP and lateral views of the right femur show displaced midshaft fracture  Labs: No results found for this or any previous visit (from the past 24 hour(s)).   Assessment/Plan: 51 year old female status post fall, resulting in right femoral shaft fracture  Patient require surgical intervention for fracture.  Recommend proceeding with intramedullary nailing of the right femur.  Will proceed with this tomorrow, hopefully first thing in the morning.  Consent will be obtained.  Plan for 15 pounds Buck's traction tonight.  N.p.o. after midnight.  Gwinda Passe PA-C [facility] [phone] (office) [email or web address]   I have seen and examined the patient.  She has what appears to be a bisphosphonate associated femoral shaft fracture.  She has a lateral cortex beaking right below the fracture.  She had a low-energy fall.  I discussed with her proceeding with intramedullary nailing.  Risks and benefits were discussed with the patient.  Risks include but not limited to bleeding, infection, malunion, nonunion, hardware failure, hardware irritation, nerve or blood vessel injury, DVT, even the possibility anesthetic complications.  She agreed to proceed with surgery and consent was obtained.  Shona Needles, MD [facility] [phone] (office) [email or web address]

## 2022-05-05 NOTE — H&P (View-Only) (Signed)
Orthopaedic Trauma Service (OTS) Consult   Patient ID: MECHELL GIRGIS MRN: [id number] DOB/AGE: 09/07/1970 51 y.o.  Reason for Consult: Right femur fracture Referring Physician: Dr. Lennice Sites, MD [facility] [place])  HPI: Latoya Bautista is an 51 y.o. female with past medical history significant for hypertension, hypothyroidism, seizures, osteoporosis being seen in consultation at the request of Dr. Ronnald Nian for right femur fracture.  Patient states that she was walking out of church earlier tonight and missed the bottom step.  She fell, landing on the right lower extremity.  Had immediate pain in the right leg and was unable to weight-bear.  Was brought to [facility] emergency department for evaluation.  Found to have right femur fracture.  Orthopedics consulted for evaluation and management. Patient seen this evening in ED room 026.  Pain is currently well-controlled at rest.  Denies any significant numbness or tingling throughout the extremity.  Denies any other injury from her fall.  Does note that she has had bilateral foot fractures in the past which have required surgical intervention but otherwise denies any previous injury or surgery to the right lower extremity.  Ambulates with no assistive device at baseline.  Denies any anticoagulation use.  Denies any tobacco use.  Her husband is at bedside.  Past Medical History:  Diagnosis Date   Anemia    Cancer ([place])    tongue cancer   H/O echocardiogram    a. 07/2017: echo showing EF of 55-60%, Grade 1 DD, and no significant valve abnormalities.    Hepatitis C    Hepatitis C 04/11/2011   Leukemia ([place])    Liver cirrhosis ([place]) 04/11/2011   Osteoporosis    Pulmonary fibrosis ([facility])    Seizures ([facility])    Shingles    Thrombocytopenia ([place]) 04/11/2011   Thyroid disease     Past Surgical History:  Procedure Laterality Date   BONE MARROW BIOPSY  05/07/2011   BONE MARROW TRANSPLANT     BUNIONECTOMY     CHOLECYSTECTOMY     liver biopsie      TONGUE BIOPSY     tongue cancer     surgical removal of area    Family History  Problem Relation Age of Onset   Hypertension Father    Diabetes Maternal Uncle     Social History:  reports that she has never smoked. She has never used smokeless tobacco. She reports that she does not drink alcohol and does not use drugs.  Allergies:  Allergies  Allergen Reactions   Simvastatin Rash   Briviact [Brivaracetam]     Elevated LFT's   Keppra [Levetiracetam]     Rash   Asparaginase Derivatives Rash   Elspar [Asparaginase] Rash   Soap Rash    Procedure prep soap    Medications: I have reviewed the patient's current medications. Prior to Admission: (Not in a hospital admission)   ROS: Constitutional: No fever or chills Vision: No changes in vision ENT: No difficulty swallowing CV: No chest pain Pulm: No SOB or wheezing GI: No nausea or vomiting GU: No urgency or inability to hold urine Skin: No poor wound healing Neurologic: No numbness or tingling Psychiatric: No depression or anxiety Heme: No bruising Allergic: No reaction to medications or food   Exam: Blood pressure (!) 160/82, pulse (!) 110, temperature 97.8 F (36.6 C), temperature source Oral, resp. rate 14, height _0  (1.499 m), weight 63.5 kg, SpO2 98 %. General: Sitting up in bed, no acute distress Orientation: Alert and  oriented x 4 Mood and Affect: Affect appropriate, pleasant and cooperative Gait: Not assessed due to known fracture Coordination and balance: Thin normal limits  Right lower extremity: Well-healed surgical scar over the great toe.  Otherwise skin without lesions.  Tenderness throughout the thigh as expected.  Swelling to the thigh mild to moderate.  Compartments easily compressible.  Nontender throughout the lower leg.  Did not attempt any hip or knee motion due to known fracture.  Ankle DF/PF intact.  Dors is sensation that extremity.  Neurovascularly intact.  Left lower extremity: Skin  without lesions. No tenderness to palpation. Full painless ROM, full strength in each muscle groups without evidence of instability.  There are and sensory function at baseline.  Neurovascularly intact   Medical Decision Making: Data: Imaging: AP and lateral views of the right femur show displaced midshaft fracture  Labs: No results found for this or any previous visit (from the past 24 hour(s)).   Assessment/Plan: 51 year old female status post fall, resulting in right femoral shaft fracture  Patient require surgical intervention for fracture.  Recommend proceeding with intramedullary nailing of the right femur.  Will proceed with this tomorrow, hopefully first thing in the morning.  Consent will be obtained.  Plan for 15 pounds Buck's traction tonight.  N.p.o. after midnight.  Gwinda Passe PA-C [facility] [phone] (office) [email or web address]   I have seen and examined the patient.  She has what appears to be a bisphosphonate associated femoral shaft fracture.  She has a lateral cortex beaking right below the fracture.  She had a low-energy fall.  I discussed with her proceeding with intramedullary nailing.  Risks and benefits were discussed with the patient.  Risks include but not limited to bleeding, infection, malunion, nonunion, hardware failure, hardware irritation, nerve or blood vessel injury, DVT, even the possibility anesthetic complications.  She agreed to proceed with surgery and consent was obtained.  Shona Needles, MD [facility] [phone] (office) [email or web address]

## 2022-05-06 ENCOUNTER — Inpatient Hospital Stay (HOSPITAL_COMMUNITY): Payer: Medicaid Other

## 2022-05-06 ENCOUNTER — Encounter (HOSPITAL_COMMUNITY)
Admission: EM | Disposition: A | Discharge status: Skilled Nursing Facility | Payer: Self-pay | Source: Home / Self Care | Attending: Student

## 2022-05-06 ENCOUNTER — Other Ambulatory Visit: Payer: Self-pay

## 2022-05-06 ENCOUNTER — Encounter (HOSPITAL_COMMUNITY): Payer: Self-pay | Admitting: Family Medicine

## 2022-05-06 ENCOUNTER — Inpatient Hospital Stay (HOSPITAL_COMMUNITY): Payer: Medicaid Other | Admitting: Certified Registered"

## 2022-05-06 DIAGNOSIS — S7291XA Unspecified fracture of right femur, initial encounter for closed fracture: Secondary | ICD-10-CM

## 2022-05-06 DIAGNOSIS — D649 Anemia, unspecified: Secondary | ICD-10-CM

## 2022-05-06 DIAGNOSIS — B1920 Unspecified viral hepatitis C without hepatic coma: Secondary | ICD-10-CM | POA: Diagnosis not present

## 2022-05-06 DIAGNOSIS — N289 Disorder of kidney and ureter, unspecified: Secondary | ICD-10-CM | POA: Diagnosis not present

## 2022-05-06 DIAGNOSIS — S72301A Unspecified fracture of shaft of right femur, initial encounter for closed fracture: Secondary | ICD-10-CM | POA: Diagnosis not present

## 2022-05-06 DIAGNOSIS — Z419 Encounter for procedure for purposes other than remedying health state, unspecified: Secondary | ICD-10-CM | POA: Diagnosis not present

## 2022-05-06 HISTORY — PX: FEMUR IM NAIL: SHX1597

## 2022-05-06 LAB — CBC
HCT: 32.7 % — ABNORMAL LOW (ref 36.0–46.0)
Hemoglobin: 11.4 g/dL — ABNORMAL LOW (ref 12.0–15.0)
MCH: 34 pg (ref 26.0–34.0)
MCHC: 34.9 g/dL (ref 30.0–36.0)
MCV: 97.6 fL (ref 80.0–100.0)
Platelets: 187 10*3/uL (ref 150–400)
RBC: 3.35 MIL/uL — ABNORMAL LOW (ref 3.87–5.11)
RDW: 12.3 % (ref 11.5–15.5)
WBC: 10.6 10*3/uL — ABNORMAL HIGH (ref 4.0–10.5)
nRBC: 0 % (ref 0.0–0.2)

## 2022-05-06 LAB — BASIC METABOLIC PANEL
Anion gap: 11 (ref 5–15)
BUN: 12 mg/dL (ref 6–20)
CO2: 23 mmol/L (ref 22–32)
Calcium: 9.6 mg/dL (ref 8.9–10.3)
Chloride: 101 mmol/L (ref 98–111)
Creatinine, Ser: 1.21 mg/dL — ABNORMAL HIGH (ref 0.44–1.00)
GFR, Estimated: 54 mL/min — ABNORMAL LOW (ref >60)
Glucose, Bld: 110 mg/dL — ABNORMAL HIGH (ref 70–99)
Potassium: 4.8 mmol/L (ref 3.5–5.1)
Sodium: 135 mmol/L (ref 135–145)

## 2022-05-06 LAB — ABO/RH: ABO/RH(D): O NEG

## 2022-05-06 LAB — HIV ANTIBODY (ROUTINE TESTING W REFLEX): HIV Screen 4th Generation wRfx: NONREACTIVE

## 2022-05-06 LAB — SURGICAL PCR SCREEN
MRSA, PCR: NEGATIVE
Staphylococcus aureus: NEGATIVE

## 2022-05-06 SURGERY — INSERTION, INTRAMEDULLARY ROD, FEMUR
Anesthesia: General | Laterality: Right

## 2022-05-06 MED ORDER — ONDANSETRON HCL 4 MG/2ML IJ SOLN: 4.0000 mg | Freq: Four times a day (QID) | INTRAMUSCULAR | Status: DC | PRN

## 2022-05-06 MED ORDER — CEFAZOLIN SODIUM-DEXTROSE 2-4 GM/100ML-% IV SOLN
INTRAVENOUS | Status: AC
  Filled 2022-05-06: qty 100

## 2022-05-06 MED ORDER — METHOCARBAMOL 500 MG PO TABS
500.0000 mg | ORAL_TABLET | Freq: Four times a day (QID) | ORAL | Status: DC | PRN
  Administered 2022-05-06 – 2022-05-08 (×3): 500 mg via ORAL
  Filled 2022-05-06 (×3): qty 1

## 2022-05-06 MED ORDER — PROPOFOL 10 MG/ML IV BOLUS
INTRAVENOUS | Status: DC | PRN
  Administered 2022-05-06: 90 mg via INTRAVENOUS

## 2022-05-06 MED ORDER — PHENYLEPHRINE HCL (PRESSORS) 10 MG/ML IV SOLN
INTRAVENOUS | Status: DC | PRN
  Administered 2022-05-06 (×2): 80 ug via INTRAVENOUS

## 2022-05-06 MED ORDER — MIDAZOLAM HCL 2 MG/2ML IJ SOLN
INTRAMUSCULAR | Status: DC | PRN
  Administered 2022-05-06: 2 mg via INTRAVENOUS

## 2022-05-06 MED ORDER — METHOCARBAMOL 1000 MG/10ML IJ SOLN: 500.0000 mg | Freq: Four times a day (QID) | INTRAVENOUS | Status: DC | PRN

## 2022-05-06 MED ORDER — PHENYLEPHRINE HCL-NACL 20-0.9 MG/250ML-% IV SOLN
INTRAVENOUS | Status: DC | PRN
  Administered 2022-05-06: 20 ug/min via INTRAVENOUS

## 2022-05-06 MED ORDER — ACETAMINOPHEN 500 MG PO TABS: 1000.0000 mg | ORAL_TABLET | Freq: Once | ORAL | Status: DC | PRN

## 2022-05-06 MED ORDER — LACTATED RINGERS IV SOLN: INTRAVENOUS | Status: DC | PRN

## 2022-05-06 MED ORDER — ACETAMINOPHEN 10 MG/ML IV SOLN
INTRAVENOUS | Status: AC
  Filled 2022-05-06: qty 100

## 2022-05-06 MED ORDER — METOCLOPRAMIDE HCL 5 MG PO TABS: 5.0000 mg | ORAL_TABLET | Freq: Three times a day (TID) | ORAL | Status: DC | PRN

## 2022-05-06 MED ORDER — POLYETHYLENE GLYCOL 3350 17 G PO PACK: 17.0000 g | PACK | Freq: Every day | ORAL | Status: DC | PRN

## 2022-05-06 MED ORDER — DEXAMETHASONE SODIUM PHOSPHATE 10 MG/ML IJ SOLN
INTRAMUSCULAR | Status: DC | PRN
  Administered 2022-05-06: 10 mg via INTRAVENOUS

## 2022-05-06 MED ORDER — METOCLOPRAMIDE HCL 5 MG/ML IJ SOLN: 5.0000 mg | Freq: Three times a day (TID) | INTRAMUSCULAR | Status: DC | PRN

## 2022-05-06 MED ORDER — PHENYLEPHRINE 80 MCG/ML (10ML) SYRINGE FOR IV PUSH (FOR BLOOD PRESSURE SUPPORT)
PREFILLED_SYRINGE | INTRAVENOUS | Status: DC | PRN
  Administered 2022-05-06 (×3): 80 ug via INTRAVENOUS

## 2022-05-06 MED ORDER — OXYCODONE HCL 5 MG PO TABS: 5.0000 mg | ORAL_TABLET | Freq: Once | ORAL | Status: DC | PRN

## 2022-05-06 MED ORDER — FENTANYL CITRATE (PF) 250 MCG/5ML IJ SOLN
INTRAMUSCULAR | Status: AC
  Filled 2022-05-06: qty 5

## 2022-05-06 MED ORDER — ACETAMINOPHEN 10 MG/ML IV SOLN
INTRAVENOUS | Status: DC | PRN
  Administered 2022-05-06: 1000 mg via INTRAVENOUS

## 2022-05-06 MED ORDER — MORPHINE SULFATE (PF) 2 MG/ML IV SOLN: 1.0000 mg | INTRAVENOUS | Status: DC | PRN

## 2022-05-06 MED ORDER — DOCUSATE SODIUM 100 MG PO CAPS
100.0000 mg | ORAL_CAPSULE | Freq: Two times a day (BID) | ORAL | Status: DC
  Administered 2022-05-06 – 2022-05-10 (×9): 100 mg via ORAL
  Filled 2022-05-06 (×9): qty 1

## 2022-05-06 MED ORDER — MORPHINE SULFATE (PF) 2 MG/ML IV SOLN
2.0000 mg | INTRAVENOUS | Status: DC | PRN
  Administered 2022-05-06: 2 mg via INTRAVENOUS
  Filled 2022-05-06 (×2): qty 1

## 2022-05-06 MED ORDER — TERIPARATIDE 600 MCG/2.4ML ~~LOC~~ SOPN
20.0000 ug | PEN_INJECTOR | Freq: Every day | SUBCUTANEOUS | Status: DC
  Filled 2022-05-06: qty 20

## 2022-05-06 MED ORDER — OXYCODONE HCL 5 MG/5ML PO SOLN: 5.0000 mg | Freq: Once | ORAL | Status: DC | PRN

## 2022-05-06 MED ORDER — ACETAMINOPHEN 500 MG PO TABS
1000.0000 mg | ORAL_TABLET | Freq: Four times a day (QID) | ORAL | Status: DC
  Administered 2022-05-06 – 2022-05-07 (×3): 1000 mg via ORAL
  Filled 2022-05-06 (×3): qty 2

## 2022-05-06 MED ORDER — 0.9 % SODIUM CHLORIDE (POUR BTL) OPTIME
TOPICAL | Status: DC | PRN
  Administered 2022-05-06: 1000 mL

## 2022-05-06 MED ORDER — BACITRACIN-NEOMYCIN-POLYMYXIN OINTMENT TUBE
TOPICAL_OINTMENT | Freq: Every day | CUTANEOUS | Status: DC
  Administered 2022-05-10: 1 via TOPICAL
  Filled 2022-05-06: qty 14

## 2022-05-06 MED ORDER — TRANEXAMIC ACID-NACL 1000-0.7 MG/100ML-% IV SOLN
INTRAVENOUS | Status: DC | PRN
  Administered 2022-05-06: 1000 mg via INTRAVENOUS

## 2022-05-06 MED ORDER — PROPOFOL 10 MG/ML IV BOLUS
INTRAVENOUS | Status: AC
  Filled 2022-05-06: qty 20

## 2022-05-06 MED ORDER — SODIUM CHLORIDE 0.9 % IV SOLN: INTRAVENOUS | Status: DC

## 2022-05-06 MED ORDER — TRANEXAMIC ACID-NACL 1000-0.7 MG/100ML-% IV SOLN
INTRAVENOUS | Status: AC
  Filled 2022-05-06: qty 100

## 2022-05-06 MED ORDER — MIDAZOLAM HCL 2 MG/2ML IJ SOLN
INTRAMUSCULAR | Status: AC
  Filled 2022-05-06: qty 2

## 2022-05-06 MED ORDER — ROCURONIUM BROMIDE 100 MG/10ML IV SOLN
INTRAVENOUS | Status: DC | PRN
  Administered 2022-05-06: 60 mg via INTRAVENOUS

## 2022-05-06 MED ORDER — FENTANYL CITRATE (PF) 100 MCG/2ML IJ SOLN
25.0000 ug | INTRAMUSCULAR | Status: DC | PRN
  Administered 2022-05-06: 50 ug via INTRAVENOUS

## 2022-05-06 MED ORDER — CEFAZOLIN SODIUM-DEXTROSE 2-4 GM/100ML-% IV SOLN
2.0000 g | INTRAVENOUS | Status: AC
  Administered 2022-05-06: 2 g via INTRAVENOUS

## 2022-05-06 MED ORDER — OXYCODONE HCL 5 MG PO TABS
5.0000 mg | ORAL_TABLET | ORAL | Status: DC | PRN
  Administered 2022-05-06: 5 mg via ORAL
  Filled 2022-05-06 (×2): qty 1

## 2022-05-06 MED ORDER — ONDANSETRON HCL 4 MG PO TABS: 4.0000 mg | ORAL_TABLET | Freq: Four times a day (QID) | ORAL | Status: DC | PRN

## 2022-05-06 MED ORDER — LIDOCAINE HCL (CARDIAC) PF 100 MG/5ML IV SOSY
PREFILLED_SYRINGE | INTRAVENOUS | Status: DC | PRN
  Administered 2022-05-06: 60 mg via INTRATRACHEAL

## 2022-05-06 MED ORDER — FENTANYL CITRATE (PF) 250 MCG/5ML IJ SOLN
INTRAMUSCULAR | Status: DC | PRN
  Administered 2022-05-06 (×2): 50 ug via INTRAVENOUS
  Administered 2022-05-06: 75 ug via INTRAVENOUS
  Administered 2022-05-06: 25 ug via INTRAVENOUS

## 2022-05-06 MED ORDER — FENTANYL CITRATE (PF) 100 MCG/2ML IJ SOLN
INTRAMUSCULAR | Status: AC
  Filled 2022-05-06: qty 2

## 2022-05-06 MED ORDER — TERIPARATIDE 600 MCG/2.4ML ~~LOC~~ SOPN
20.0000 ug | PEN_INJECTOR | Freq: Every day | SUBCUTANEOUS | Status: DC
  Administered 2022-05-06 – 2022-05-09 (×4): 20 ug via SUBCUTANEOUS
  Filled 2022-05-06 (×7): qty 1

## 2022-05-06 MED ORDER — SUGAMMADEX SODIUM 200 MG/2ML IV SOLN
INTRAVENOUS | Status: DC | PRN
  Administered 2022-05-06: 200 mg via INTRAVENOUS

## 2022-05-06 MED ORDER — CEFAZOLIN SODIUM-DEXTROSE 2-4 GM/100ML-% IV SOLN
2.0000 g | Freq: Three times a day (TID) | INTRAVENOUS | Status: AC
  Administered 2022-05-06 – 2022-05-07 (×3): 2 g via INTRAVENOUS
  Filled 2022-05-06 (×3): qty 100

## 2022-05-06 MED ORDER — ACETAMINOPHEN 160 MG/5ML PO SOLN: 1000.0000 mg | Freq: Once | ORAL | Status: DC | PRN

## 2022-05-06 MED ORDER — ACETAMINOPHEN 10 MG/ML IV SOLN: 1000.0000 mg | Freq: Once | INTRAVENOUS | Status: DC | PRN

## 2022-05-06 SURGICAL SUPPLY — 64 items
ADH SKN CLS APL DERMABOND .7 (GAUZE/BANDAGES/DRESSINGS) ×1
APL PRP STRL LF DISP 70% ISPRP (MISCELLANEOUS) ×1
BAG COUNTER SPONGE SURGICOUNT (BAG) ×1 IMPLANT
BIT DRILL CANN FLEX 14 (BIT) IMPLANT
BIT DRILL SHORT 4.2 (BIT) IMPLANT
BIT DRILL STEP 4.5X6.5 (BIT) IMPLANT
BLADE SURG 10 STRL SS (BLADE) ×2 IMPLANT
BNDG COHESIVE 4X5 TAN STRL (GAUZE/BANDAGES/DRESSINGS) ×1 IMPLANT
BNDG COHESIVE 6X5 TAN STRL LF (GAUZE/BANDAGES/DRESSINGS) IMPLANT
BNDG ELASTIC 4X5.8 VLCR STR LF (GAUZE/BANDAGES/DRESSINGS) ×1 IMPLANT
BNDG ELASTIC 6X5.8 VLCR STR LF (GAUZE/BANDAGES/DRESSINGS) ×1 IMPLANT
BRUSH SCRUB EZ PLAIN DRY (MISCELLANEOUS) ×2 IMPLANT
CHLORAPREP W/TINT 26 (MISCELLANEOUS) ×1 IMPLANT
COVER SURGICAL LIGHT HANDLE (MISCELLANEOUS) ×1 IMPLANT
DERMABOND ADVANCED .7 DNX12 (GAUZE/BANDAGES/DRESSINGS) IMPLANT
DRAPE C-ARM 35X43 STRL (DRAPES) ×1 IMPLANT
DRAPE C-ARMOR (DRAPES) ×1 IMPLANT
DRAPE HALF SHEET 40X57 (DRAPES) ×2 IMPLANT
DRAPE IMP U-DRAPE 54X76 (DRAPES) ×2 IMPLANT
DRAPE INCISE IOBAN 66X45 STRL (DRAPES) ×1 IMPLANT
DRAPE ORTHO SPLIT 77X108 STRL (DRAPES)
DRAPE SURG 17X23 STRL (DRAPES) ×1 IMPLANT
DRAPE SURG ORHT 6 SPLT 77X108 (DRAPES) ×2 IMPLANT
DRAPE U-SHAPE 47X51 STRL (DRAPES) ×1 IMPLANT
DRESSING MEPILEX FLEX 4X4 (GAUZE/BANDAGES/DRESSINGS) IMPLANT
DRILL BIT SHORT 4.2 (BIT) ×2
DRILL BIT STEP 4.5X6.5 (BIT) ×1
DRSG MEPILEX BORDER 4X4 (GAUZE/BANDAGES/DRESSINGS) ×3 IMPLANT
DRSG MEPILEX BORDER 4X8 (GAUZE/BANDAGES/DRESSINGS) ×1 IMPLANT
DRSG MEPILEX FLEX 4X4 (GAUZE/BANDAGES/DRESSINGS) ×3
ELECT REM PT RETURN 9FT ADLT (ELECTROSURGICAL) ×1
ELECTRODE REM PT RTRN 9FT ADLT (ELECTROSURGICAL) ×1 IMPLANT
GLOVE BIO SURGEON STRL SZ 6.5 (GLOVE) ×3 IMPLANT
GLOVE BIO SURGEON STRL SZ7.5 (GLOVE) ×3 IMPLANT
GLOVE BIOGEL PI IND STRL 6.5 (GLOVE) ×1 IMPLANT
GLOVE BIOGEL PI IND STRL 7.5 (GLOVE) ×1 IMPLANT
GOWN STRL REUS W/ TWL LRG LVL3 (GOWN DISPOSABLE) ×3 IMPLANT
GOWN STRL REUS W/ TWL XL LVL3 (GOWN DISPOSABLE) ×1 IMPLANT
GOWN STRL REUS W/TWL LRG LVL3 (GOWN DISPOSABLE) ×3
GOWN STRL REUS W/TWL XL LVL3 (GOWN DISPOSABLE) ×1
GUIDEWIRE 3.2X400 (WIRE) IMPLANT
KIT BASIN OR (CUSTOM PROCEDURE TRAY) ×1 IMPLANT
KIT TURNOVER KIT B (KITS) ×1 IMPLANT
MANIFOLD NEPTUNE II (INSTRUMENTS) ×1 IMPLANT
NAIL CANN TI FEM RT GT 10X320 (Nail) IMPLANT
NS IRRIG 1000ML POUR BTL (IV SOLUTION) ×1 IMPLANT
PACK GENERAL/GYN (CUSTOM PROCEDURE TRAY) ×1 IMPLANT
PAD ARMBOARD 7.5X6 YLW CONV (MISCELLANEOUS) ×2 IMPLANT
REAMER ROD DEEP FLUTE 2.5X950 (INSTRUMENTS) IMPLANT
SCREW HIP F/IM NL 6.5/L85/XL40 (Screw) IMPLANT
SCREW LOCK IM 5X38X125 (Screw) IMPLANT
SCREW LOCK IM NAIL 5X34 (Screw) IMPLANT
STAPLER VISISTAT 35W (STAPLE) ×1 IMPLANT
STOCKINETTE IMPERVIOUS LG (DRAPES) ×1 IMPLANT
SUT ETHILON 3 0 PS 1 (SUTURE) ×1 IMPLANT
SUT MNCRL AB 3-0 PS2 18 (SUTURE) ×1 IMPLANT
SUT VIC AB 0 CT1 27 (SUTURE)
SUT VIC AB 0 CT1 27XBRD ANBCTR (SUTURE) IMPLANT
SUT VIC AB 2-0 CT1 27 (SUTURE) ×1
SUT VIC AB 2-0 CT1 TAPERPNT 27 (SUTURE) ×2 IMPLANT
TOWEL GREEN STERILE (TOWEL DISPOSABLE) ×2 IMPLANT
TOWEL GREEN STERILE FF (TOWEL DISPOSABLE) ×1 IMPLANT
UNDERPAD 30X36 HEAVY ABSORB (UNDERPADS AND DIAPERS) ×1 IMPLANT
WATER STERILE IRR 1000ML POUR (IV SOLUTION) ×1 IMPLANT

## 2022-05-06 NOTE — Progress Notes (Signed)
Pt is transferred from PACU. VS WDL.Pt is AxO X4.  Pain is minimal per pt. Dressing on RLE is clean, dry , intact. Ice pack applied. Call bell within reach. Bed alarm is on.

## 2022-05-06 NOTE — Op Note (Signed)
Orthopaedic Surgery Operative Note (CSN: [id number] ) Date of Surgery: 05/06/2022  Admit Date: 05/05/2022   Diagnoses: Pre-Op Diagnoses: Right femoral shaft fracture  Post-Op Diagnosis: Same  Procedures: CPT 27506-Intramedullary nailing of right femur fracture  Surgeons : Primary: Shona Needles, MD  Assistant: None  Location: OR 7   Anesthesia:General   Antibiotics: Ancef 2g preop   Tourniquet time:None    Estimated Blood OIZT:245 mL  Complications:None   Specimens:none   Implants: Implant Name Type Inv. Item Serial No. Manufacturer Lot No. LRB No. Used Action  NAIL CANN TI FEM RT GT [id number] - [id number] Nail NAIL CANN TI FEM RT GT 10X320  [employer] [id number] Right 1 Implanted  SCREW HIP F/IM NL 6.5/L85/XL40 - [id number] Screw SCREW HIP F/IM NL 6.5/L85/XL40  [employer] [id number] Right 1 Implanted  SCREW HIP F/IM NL 6.5/L85/XL40 - [id number] Screw SCREW HIP F/IM NL 6.5/L85/XL40  [employer] [id number] Right 1 Implanted  SCREW LOCK IM NAIL 5X34 - [id number] Screw SCREW LOCK IM NAIL 5X34  [employer] [id number] Right 1 Implanted  SCREW LOCK IM [id number] - [id number] Screw SCREW LOCK IM [id number]  Dewitt Hoes [id number] Right 1 Implanted     Indications for Surgery: 52 year old female who sustained a ground-level fall and a right femoral shaft fracture.  Due to the unstable nature of her injury I recommend proceeding with intramedullary nailing of the right femur.  Risks and benefits were discussed with the patient.  Risks included but not limited to bleeding, infection, malunion, nonunion, hardware failure, hardware irritation, nerve or blood vessel injury, DVT, and the possibility anesthetic complications.  She agreed to proceed with surgery and consent was obtained.  Operative Findings: Antegrade intramedullary nailing of right femoral shaft fracture using [employer] FRN 10 x 320 mm nail with femoral recon screws.  Procedure: The patient was  identified in the preoperative holding area. Consent was confirmed with the patient and their family and all questions were answered. The operative extremity was marked after confirmation with the patient. she was then brought back to the operating room by our anesthesia colleagues.  She was placed under general anesthetic and carefully transferred over to the [facility] table.  All bony prominences were well-padded.  Traction was applied to the lower extremity and fluoroscopic imaging showed adequate reduction.  The right lower extremity was then prepped and draped in usual sterile fashion.  A timeout was performed to verify the patient, the procedure, and the extremity.  Preoperative antibiotics were dosed.  The small incision proximal to the greater trochanter was made and carried down through skin and subcutaneous tissue.  I then used an awl to find the appropriate starting point medial to the greater trochanter and the location of the piriformis fossa.  I advanced the awl into the bone and then advanced a threaded guidewire into the proximal metaphysis.  I confirmed positioning with fluoroscopy.  I then used an entry reamer to enter the medullary canal.  I then passed a ball-tipped guidewire down the central canal.  I used a percutaneous reduction aids using a bone hook to manipulate the proximal segment to pass the ball-tipped guidewire into the distal segment.  I then seated it into the distal metaphysis.  I measured the length and chose to use a 320 mm nail.  I then sequentially reamed from 8 mm to 11.5 mm.  I placed a 10 mm nail.  Attached to the targeting arm and then used the targeting arm to direct threaded guidewires through the percutaneous  incision into the head/neck segment.  I confirmed adequate positioning on the lateral view.  I then measured, drilled and placed the femoral recon screws.  I then remove the proximal targeting arm and used perfect circle technique to place 2 lateral to medial distal  interlocking screws.  The incisions were then irrigated.  They were closed with 2-0 Vicryl and 3-0 Monocryl.  Dermabond was used to seal the skin.  Sterile dressings were applied.  The patient was then awoke from anesthesia and taken to the PACU in stable condition.  Post Op Plan/Instructions: Patient will be weightbearing as tolerated to the right lower extremity.  She will receive postoperative Ancef.  She will receive Lovenox for DVT prophylaxis and discharged on a DOAC.  We will have her mobilize with physical and Occupational Therapy.  I was present and performed the entire surgery.  Patrecia Pace, PA-C did assist me throughout the case. An assistant was necessary given the difficulty in approach, maintenance of reduction and ability to instrument the fracture.   Katha Hamming, MD [facility]

## 2022-05-06 NOTE — Progress Notes (Signed)
   05/06/22 1617  Assess: MEWS Score  Temp 98.2 F (36.8 C)  BP 135/77  Pulse Rate (!) 120  Resp 16  SpO2 98 %  O2 Device Room Air  Assess: MEWS Score  MEWS Temp 0  MEWS Systolic 0  MEWS Pulse 2  MEWS RR 0  MEWS LOC 0  MEWS Score 2  MEWS Score Color Yellow  Assess: if the MEWS score is Yellow or Red  Were vital signs taken at a resting state? No  Focused Assessment No change from prior assessment  Does the patient meet 2 or more of the SIRS criteria? No  MEWS guidelines implemented *See Row Information* Yes  Treat  MEWS Interventions Administered scheduled meds/treatments  Pain Scale 0-10  Pain Score 5  Pain Type Surgical pain  Pain Location Hip  Pain Orientation Right  Pain Intervention(s) Repositioned  Take Vital Signs  Increase Vital Sign Frequency  Yellow: Q 2hr X 2 then Q 4hr X 2, if remains yellow, continue Q 4hrs  Escalate  MEWS: Escalate Yellow: discuss with charge nurse/RN and consider discussing with provider and RRT  Notify: Charge Nurse/RN  Name of Charge Nurse/RN Notified Lauren  Date Charge Nurse/RN Notified 05/06/22  Time Charge Nurse/RN Notified 1759  Provider Notification  Provider Name/Title Ogbata, MD  Date Provider Notified 05/06/22  Time Provider Notified 1803  Method of Notification Page (secure chat)  Notification Reason Other (Comment) (elevated HR)  Provider response No new orders  Notify: Rapid Response  Name of Rapid Response RN Notified n/a  Document  Patient Outcome Other (Comment) (HR is 113 at 1800)  Progress note created (see row info) Yes  Assess: SIRS CRITERIA  SIRS Temperature  0  SIRS Pulse 1  SIRS Respirations  0  SIRS WBC 0  SIRS Score Sum  1

## 2022-05-06 NOTE — ED Notes (Signed)
I've called 5n for a purple man

## 2022-05-06 NOTE — Progress Notes (Signed)
PROGRESS NOTE    Latoya Bautista  ZOX:[id number] DOB: 1971-02-05 DOA: 05/05/2022 PCP: [name], MD  Outpatient Specialists:     Brief Narrative:  Patient is a 51 year old female past medical history significant for leukemia, tongue cancer, liver cirrhosis, pulmonary fibrosis, seizures, hepatitis C and hypothyroidism.  Patient was admitted with right hip fracture following a fall.  Patient underwent intramedullary nailing of the right femur fracture earlier today.  Orthopedic input is appreciated.  Pain is controlled.   Assessment & Plan:   Principal Problem:   Femur fracture, right (HCC) Active Problems:   Hepatitis C   Liver cirrhosis (HCC)   Thrombocytopenia (HCC)   Localization-related epilepsy ([place])   Melanoma in situ of skin of buttock (HCC)   History of thrombocytopenia   ILD (interstitial lung disease) ([place])   Bone marrow transplant status ([place])   Right hip fracture: -Patient had a mechanical fall. -Patient has undergone surgery for right hip fracture. -Orthopedic team is directing postop management. -Pain is controlled.  Acute kidney injury: -Slowly resolving. -Serum creatinine is 1.21 today. -Continue to monitor renal function and electrolytes.   Hypothyroidism: -Stable -Synthroid resumed   Dyslipidemia: -Tricor and Crestor resumed   Hypertension: -Lopressor and olmesartan -Continue to monitor closely.   Seizure: -Lamictal    GERD: -Protonix.   History of hyponatremia: -Likely SIADH. -Sodium is 135 today.   Osteoporosis: -Forteo and vitamin D     ILD (interstitial lung disease) ([place]): -Nebulizers as needed.     Hepatitis C: -Treated    Liver cirrhosis (HCC)  Melanoma in situ of skin of buttock (HCC)  DVT prophylaxis: Subcutaneous Lovenox 40 Mg once daily. Code Status: Full code Family Communication:  Disposition Plan: This will depend on hospital course.   Consultants:  Orthopedic team.  Procedures:  Right hip  surgery  Antimicrobials:  None   Subjective: Right hip pain is controlled.  Objective: Vitals:   05/06/22 1009 05/06/22 1045 05/06/22 1617 05/06/22 1754  BP: (!) 163/70 (!) 141/66 135/77 (!) 122/58  Pulse: 82 79 (!) 120 (!) 112  Resp: '18 16 16 19  '$ Temp: 98 F (36.7 C) 98.2 F (36.8 C) 98.2 F (36.8 C) 98.2 F (36.8 C)  TempSrc:      SpO2: 100% 100% 98% 98%  Weight:      Height:        Intake/Output Summary (Last 24 hours) at 05/06/2022 1903 Last data filed at 05/06/2022 1629 Gross per 24 hour  Intake 2050 ml  Output 1500 ml  Net 550 ml   Filed Weights   05/05/22 1849  Weight: 63.5 kg    Examination:  General exam: Appears calm and comfortable  Respiratory system: Clear to auscultation.  Cardiovascular system: S1 & S2 heard. Gastrointestinal system: Abdomen is soft and nontender. No organomegaly or masses felt. Normal bowel sounds heard. Central nervous system: Alert and oriented. No focal neurological deficits. Extremities: No leg edema.   Data Reviewed: I have personally reviewed following labs and imaging studies  CBC: Recent Labs  Lab 05/05/22 1858 05/06/22 0145  WBC 8.5 10.6*  NEUTROABS 5.1  --   HGB 12.9 11.4*  HCT 38.6 32.7*  MCV 99.2 97.6  PLT 210 811   Basic Metabolic Panel: Recent Labs  Lab 05/05/22 1858 05/06/22 0145  NA 131* 135  K 4.5 4.8  CL 99 101  CO2 23 23  GLUCOSE 120* 110*  BUN 12 12  CREATININE 1.31* 1.21*  CALCIUM 9.8 9.6   GFR: Estimated  Creatinine Clearance: 44.5 mL/min (A) (by C-G formula based on SCr of 1.21 mg/dL (H)). Liver Function Tests: Recent Labs  Lab 05/05/22 1858  AST 40  ALT 26  ALKPHOS 79  BILITOT 0.4  PROT 7.3  ALBUMIN 4.2   No results for input(s): "LIPASE", "AMYLASE" in the last 168 hours. No results for input(s): "AMMONIA" in the last 168 hours. Coagulation Profile: No results for input(s): "INR", "PROTIME" in the last 168 hours. Cardiac Enzymes: No results for input(s): "CKTOTAL", "CKMB",  "CKMBINDEX", "TROPONINI" in the last 168 hours. BNP (last 3 results) No results for input(s): "PROBNP" in the last 8760 hours. HbA1C: No results for input(s): "HGBA1C" in the last 72 hours. CBG: No results for input(s): "GLUCAP" in the last 168 hours. Lipid Profile: No results for input(s): "CHOL", "HDL", "LDLCALC", "TRIG", "CHOLHDL", "LDLDIRECT" in the last 72 hours. Thyroid Function Tests: No results for input(s): "TSH", "T4TOTAL", "FREET4", "T3FREE", "THYROIDAB" in the last 72 hours. Anemia Panel: No results for input(s): "VITAMINB12", "FOLATE", "FERRITIN", "TIBC", "IRON", "RETICCTPCT" in the last 72 hours. Urine analysis:    Component Value Date/Time   COLORURINE YELLOW 07/28/2014 0600   APPEARANCEUR CLEAR 07/28/2014 0600   LABSPEC >1.030 (H) 07/28/2014 0600   PHURINE 5.5 07/28/2014 0600   GLUCOSEU NEGATIVE 07/28/2014 0600   HGBUR NEGATIVE 07/28/2014 0600   BILIRUBINUR NEGATIVE 07/28/2014 0600   KETONESUR NEGATIVE 07/28/2014 0600   PROTEINUR 100 (A) 07/28/2014 0600   UROBILINOGEN 0.2 07/28/2014 0600   NITRITE NEGATIVE 07/28/2014 0600   LEUKOCYTESUR NEGATIVE 07/28/2014 0600   Sepsis Labs: '@LABRCNTIP'$ (procalcitonin:4,lacticidven:4)  ) Recent Results (from the past 240 hour(s))  Surgical pcr screen     Status: None   Collection Time: 05/06/22  4:09 AM   Specimen: Nasal Mucosa; Nasal Swab  Result Value Ref Range Status   MRSA, PCR NEGATIVE NEGATIVE Final   Staphylococcus aureus NEGATIVE NEGATIVE Final    Comment: (NOTE) The Xpert SA Assay (FDA approved for NASAL specimens in patients 62 years of age and older), is one component of a comprehensive surveillance program. It is not intended to diagnose infection nor to guide or monitor treatment. Performed at [facility], [place] [address] [place], [place] [address]          Radiology Studies: DG FEMUR PORT, MIN 2 VIEWS RIGHT  Result Date: 05/06/2022 CLINICAL DATA:  Right femoral fracture repair EXAM: RIGHT  FEMUR PORTABLE 2 VIEW COMPARISON:  None Available. FINDINGS: Patient is status post repair of a right femoral fracture. Postoperative air seen in the soft tissues. An intramedullary rod extends from the proximal femur to the distal femur with 2 distal interlocking screws. Alignment is near anatomic by the end of the study. Two screws traverse the intertrochanteric region into the femoral neck and head, in good position. IMPRESSION: Right femoral fracture repair as above. Alignment is near anatomic by the end of the study. Electronically Signed   By: Dorise Bullion III M.D.   On: 05/06/2022 11:31   DG FEMUR, MIN 2 VIEWS RIGHT  Result Date: 05/06/2022 CLINICAL DATA:  Right femoral fracture repair Fluoroscopy time: 2 minutes and 18 seconds Cumulative air kerma: 14.16 mGy Images: 8 EXAM: RIGHT FEMUR 2 VIEWS COMPARISON:  None Available. FINDINGS: Eight images were obtained during repair of the right femoral fracture. By the end of the study, an intramedullary rod extends into the distal femur with 2 distal interlocking screws. Two screws traverse the intertrochanteric region into the femoral neck and head. Hardware is in good position.  IMPRESSION: Right femoral fracture repair as above. Electronically Signed   By: Dorise Bullion III M.D.   On: 05/06/2022 11:30   DG C-Arm 1-60 Min-No Report  Result Date: 05/06/2022 Fluoroscopy was utilized by the requesting physician.  No radiographic interpretation.   DG C-Arm 1-60 Min-No Report  Result Date: 05/06/2022 Fluoroscopy was utilized by the requesting physician.  No radiographic interpretation.   DG C-Arm 1-60 Min-No Report  Result Date: 05/06/2022 Fluoroscopy was utilized by the requesting physician.  No radiographic interpretation.   DG FEMUR, MIN 2 VIEWS RIGHT  Result Date: 05/05/2022 CLINICAL DATA:  Recent fall downstairs with leg pain and deformity, initial encounter EXAM: RIGHT FEMUR 2 VIEWS COMPARISON:  None Available. FINDINGS: Oblique fracture is  noted through the proximal diaphysis. Distal fracture fragment is displaced one bone width laterally and posteriorly. No other fractures are seen. No soft tissue abnormality is noted. IMPRESSION: Oblique fracture through the proximal diaphysis as described. Electronically Signed   By: Inez Catalina M.D.   On: 05/05/2022 19:19   DG Pelvis Portable  Result Date: 05/05/2022 CLINICAL DATA:  Recent fall downstairs with right leg pain, initial encounter EXAM: PORTABLE PELVIS 1 VIEWS COMPARISON:  None Available. FINDINGS: Pelvic ring is intact. The femoral heads are well seated. There is an oblique fracture through the midshaft of the right femur not completely visualized on this exam. No soft tissue abnormality is noted. IMPRESSION: Oblique fracture through the midshaft of the right femur incompletely evaluated on this study. Electronically Signed   By: Inez Catalina M.D.   On: 05/05/2022 19:18        Scheduled Meds:  acetaminophen  1,000 mg Oral Q6H   docusate sodium  100 mg Oral BID   enoxaparin (LOVENOX) injection  40 mg Subcutaneous Q24H   estradiol  1 mg Oral Daily   famotidine  20 mg Oral Daily   fenofibrate  160 mg Oral Daily   fentaNYL       folic acid  1 mg Oral Daily   irbesartan  75 mg Oral Daily   lamoTRIgine  150 mg Oral BID   levothyroxine  75 mcg Oral Daily   metoprolol tartrate  50 mg Oral BID   neomycin-bacitracin-polymyxin   Topical Daily   pantoprazole  40 mg Oral Daily   progesterone  100 mg Oral Daily   rosuvastatin  40 mg Oral Daily   Vitamin D (Ergocalciferol)  50,000 Units Oral Q14 Days   Continuous Infusions:  sodium chloride      ceFAZolin (ANCEF) IV 2 g (05/06/22 1629)   methocarbamol (ROBAXIN) IV       LOS: 1 day    Time spent: 35 minutes    Dana Allan, MD  [facility] Pager #: [phone] 7PM-7AM contact night coverage as above

## 2022-05-06 NOTE — Transfer of Care (Signed)
Immediate Anesthesia Transfer of Care Note  Patient: Latoya Bautista  Procedure(s) Performed: INTRAMEDULLARY (IM) NAIL FEMORAL (Right)  Patient Location: PACU  Anesthesia Type:General  Level of Consciousness: awake, alert , and oriented  Airway & Oxygen Therapy: Patient Spontanous Breathing  Post-op Assessment: Report given to RN and Post -op Vital signs reviewed and stable  Post vital signs: Reviewed and stable  Last Vitals:  Vitals Value Taken Time  BP 151/75 05/06/22 0939  Temp 36.6 C 05/06/22 0939  Pulse 96 05/06/22 0941  Resp 19 05/06/22 0941  SpO2 100 % 05/06/22 0941  Vitals shown include unvalidated device data.  Last Pain:  Vitals:   05/06/22 0449  TempSrc:   PainSc: 8          Complications: No notable events documented.

## 2022-05-06 NOTE — Plan of Care (Signed)

## 2022-05-06 NOTE — Plan of Care (Signed)

## 2022-05-06 NOTE — Evaluation (Signed)
Physical Therapy Evaluation Patient Details Name: Latoya Bautista MRN: [id number] DOB: [birth year] Today's Date: 05/06/2022  History of Present Illness  Pt is 52 yo female who presented on 05/05/22 after a fall resulting in R femur fx. Pt underwent IM nailing by Dr [name] on 05/06/22. PMH: leukemia, tongue cancer, liver cirrhosis, pulmonary fibrosis, hep C.  Clinical Impression  Pt admitted with above diagnosis. Pt from home with husband. He works in the day and she has a second level bedroom. Pt able to come to EOB and ambulate 6' fwd and back with RW and min A. At this time recommend SNF at d/c before home. Pt prefers [facility] if possible.  Pt currently with functional limitations due to the deficits listed below (see PT Problem List). Pt will benefit from skilled PT to increase their independence and safety with mobility to allow discharge to the venue listed below.          Recommendations for follow up therapy are one component of a multi-disciplinary discharge planning process, led by the attending physician.  Recommendations may be updated based on patient status, additional functional criteria and insurance authorization.  Follow Up Recommendations Skilled nursing-short term rehab (<3 hours/day) Can patient physically be transported by private vehicle: Yes    Assistance Recommended at Discharge Frequent or constant Supervision/Assistance  Patient can return home with the following  A lot of help with walking and/or transfers;A lot of help with bathing/dressing/bathroom;Assistance with cooking/housework;Assist for transportation;Help with stairs or ramp for entrance    Equipment Recommendations Rolling walker (2 wheels)  Recommendations for Other Services  OT consult    Functional Status Assessment Patient has had a recent decline in their functional status and demonstrates the ability to make significant improvements in function in a reasonable and predictable amount of time.      Precautions / Restrictions Precautions Precautions: Fall Restrictions Weight Bearing Restrictions: Yes RLE Weight Bearing: Weight bearing as tolerated      Mobility  Bed Mobility Overal bed mobility: Needs Assistance Bed Mobility: Supine to Sit     Supine to sit: Min assist     General bed mobility comments: min A to RLE and hips to pivot to EOB. Pt able to reach across body with R hand and manage trunk with use of rail    Transfers Overall transfer level: Needs assistance Equipment used: Rolling walker (2 wheels) Transfers: Sit to/from Stand, Bed to chair/wheelchair/BSC Sit to Stand: Min assist   Step pivot transfers: Min assist       General transfer comment: min A to power up and steady    Ambulation/Gait Ambulation/Gait assistance: Min assist Gait Distance (Feet): 6 Feet Assistive device: Rolling walker (2 wheels) Gait Pattern/deviations: Step-to pattern Gait velocity: decreased Gait velocity interpretation: <1.31 ft/sec, indicative of household ambulator   General Gait Details: vc's for sequencing  Stairs            Wheelchair Mobility    Modified Rankin (Stroke Patients Only)       Balance Overall balance assessment: Needs assistance Sitting-balance support: Feet supported, No upper extremity supported Sitting balance-Leahy Scale: Good     Standing balance support: Bilateral upper extremity supported, During functional activity Standing balance-Leahy Scale: Poor Standing balance comment: reliant on AD and external support                             Pertinent Vitals/Pain Pain Assessment Pain Assessment: Faces Pain Score: 8  Faces Pain Scale: Hurts even more Pain Location: R hip Pain Descriptors / Indicators: Aching, Sore Pain Intervention(s): Limited activity within patient's tolerance, Monitored during session    Home Living Family/patient expects to be discharged to:: Private residence Living Arrangements:  Spouse/significant other Available Help at Discharge: Family;Available PRN/intermittently Type of Home: House Home Access: Stairs to enter Entrance Stairs-Rails: Left Entrance Stairs-Number of Steps: 2 Alternate Level Stairs-Number of Steps: flight Home Layout: Two level Home Equipment: None Additional Comments: husband works, church members have offered to help as needed    Prior Function Prior Level of Function : Independent/Modified Independent;Driving             Mobility Comments: [facility] [employer] [facility] each morning ADLs Comments: independent     Hand Dominance   Dominant Hand: Right    Extremity/Trunk Assessment   Upper Extremity Assessment Upper Extremity Assessment: Overall WFL for tasks assessed    Lower Extremity Assessment Lower Extremity Assessment: RLE deficits/detail RLE Deficits / Details: hip flex 2/5, knee ext 3-/5, no buckling in standing RLE Sensation: WNL RLE Coordination: decreased gross motor    Cervical / Trunk Assessment Cervical / Trunk Assessment: Normal  Communication   Communication: No difficulties  Cognition Arousal/Alertness: Awake/alert Behavior During Therapy: WFL for tasks assessed/performed Overall Cognitive Status: Within Functional Limits for tasks assessed                                          General Comments General comments (skin integrity, edema, etc.): HR up to 140 bpm with mobility    Exercises General Exercises - Lower Extremity Ankle Circles/Pumps: AROM, Both, 10 reps, Supine Quad Sets: AROM, Right, 10 reps, Supine Gluteal Sets: AROM, 10 reps, Supine Long Arc Quad: AROM, Right, 5 reps, Seated   Assessment/Plan    PT Assessment Patient needs continued PT services  PT Problem List Decreased strength;Decreased range of motion;Decreased activity tolerance;Decreased balance;Decreased mobility;Decreased knowledge of use of DME;Decreased knowledge of  precautions;Pain;Cardiopulmonary status limiting activity       PT Treatment Interventions DME instruction;Gait training;Stair training;Functional mobility training;Therapeutic activities;Therapeutic exercise;Balance training;Patient/family education;Neuromuscular re-education    PT Goals (Current goals can be found in the Care Plan section)  Acute Rehab PT Goals Patient Stated Goal: pt used to work at [employer], would like to go to rehab there then home PT Goal Formulation: With patient Time For Goal Achievement: 05/20/22 Potential to Achieve Goals: Good    Frequency Min 3X/week     Co-evaluation               AM-PAC PT "6 Clicks" Mobility  Outcome Measure Help needed turning from your back to your side while in a flat bed without using bedrails?: A Little Help needed moving from lying on your back to sitting on the side of a flat bed without using bedrails?: A Little Help needed moving to and from a bed to a chair (including a wheelchair)?: A Little Help needed standing up from a chair using your arms (e.g., wheelchair or bedside chair)?: A Little Help needed to walk in hospital room?: A Lot Help needed climbing 3-5 steps with a railing? : A Lot 6 Click Score: 16    End of Session Equipment Utilized During Treatment: Gait belt Activity Tolerance: Patient tolerated treatment well Patient left: in chair;with call bell/phone within reach;with chair alarm set Nurse Communication:  Mobility status PT Visit Diagnosis: Unsteadiness on feet (R26.81);Difficulty in walking, not elsewhere classified (R26.2);Pain Pain - Right/Left: Right Pain - part of body: Hip    Time: 7129-2909 PT Time Calculation (min) (ACUTE ONLY): 35 min   Charges:   PT Evaluation $PT Eval Moderate Complexity: 1 Mod PT Treatments $Gait Training: 8-22 mins        Leighton Roach, PT  Acute Rehab Services Secure chat preferred Office [place] 05/06/2022, 4:43 PM

## 2022-05-06 NOTE — Anesthesia Preprocedure Evaluation (Addendum)
Anesthesia Evaluation  Patient identified by MRN, date of birth, ID band Patient awake    Reviewed: Allergy & Precautions, NPO status , Patient's Chart, lab work & pertinent test results  History of Anesthesia Complications Negative for: history of anesthetic complications  Airway Mallampati: III  TM Distance: >3 FB Neck ROM: Full    Dental  (+) Dental Advisory Given, Teeth Intact   Pulmonary neg pulmonary ROS   breath sounds clear to auscultation       Cardiovascular negative cardio ROS  Rhythm:Regular   1. Left ventricular ejection fraction, by estimation, is 60 to 65%. Left  ventricular ejection fraction by 2D MOD biplane is 62.0 %. The left  ventricle has normal function. The left ventricle has no regional wall  motion abnormalities. Indeterminate  diastolic filling due to E-A fusion.   2. Right ventricular systolic function is normal. The right ventricular  size is normal.   3. The mitral valve is grossly normal. Trivial mitral valve  regurgitation. No evidence of mitral stenosis.   4. The aortic valve is tricuspid. Aortic valve regurgitation is trivial.  No aortic stenosis is present.     Neuro/Psych Seizures -, Well Controlled,   negative psych ROS   GI/Hepatic negative GI ROS,,,(+) Hepatitis -, CLab Results      Component                Value               Date                      ALT                      26                  05/05/2022                AST                      40                  05/05/2022                ALKPHOS                  79                  05/05/2022                BILITOT                  0.4                 05/05/2022              Endo/Other    Renal/GU Renal InsufficiencyRenal diseaseLab Results      Component                Value               Date                      CREATININE               1.21 (H)            05/06/2022  Musculoskeletal   Abdominal    Peds  Hematology  (+) Blood dyscrasia, anemia Lab Results      Component                Value               Date                      WBC                      10.6 (H)            05/06/2022                HGB                      11.4 (L)            05/06/2022                HCT                      32.7 (L)            05/06/2022                MCV                      97.6                05/06/2022                PLT                      187                 05/06/2022             S/p bone marrow transplant x2 for leukemia   Anesthesia Other Findings   Reproductive/Obstetrics                             Anesthesia Physical Anesthesia Plan  ASA: 3  Anesthesia Plan: General   Post-op Pain Management:    Induction: Intravenous  PONV Risk Score and Plan: 3 and Ondansetron and Dexamethasone  Airway Management Planned: Oral ETT  Additional Equipment: None  Intra-op Plan:   Post-operative Plan: Extubation in OR  Informed Consent: I have reviewed the patients History and Physical, chart, labs and discussed the procedure including the risks, benefits and alternatives for the proposed anesthesia with the patient or authorized representative who has indicated his/her understanding and acceptance.     Dental advisory given  Plan Discussed with: CRNA  Anesthesia Plan Comments:        Anesthesia Quick Evaluation

## 2022-05-06 NOTE — Anesthesia Postprocedure Evaluation (Signed)
Anesthesia Post Note  Patient: Latoya Bautista  Procedure(s) Performed: INTRAMEDULLARY (IM) NAIL FEMORAL (Right)     Patient location during evaluation: PACU Anesthesia Type: General Level of consciousness: awake and alert Pain management: pain level controlled Vital Signs Assessment: post-procedure vital signs reviewed and stable Respiratory status: spontaneous breathing, nonlabored ventilation, respiratory function stable and patient connected to nasal cannula oxygen Cardiovascular status: blood pressure returned to baseline and stable Postop Assessment: no apparent nausea or vomiting Anesthetic complications: no  No notable events documented.  Last Vitals:  Vitals:   05/06/22 1754 05/06/22 2029  BP: (!) 122/58 118/80  Pulse: (!) 112 (!) 111  Resp: 19 20  Temp: 36.8 C 36.7 C  SpO2: 98% 98%    Last Pain:  Vitals:   05/06/22 2029  TempSrc: Oral  PainSc:                  [name]

## 2022-05-06 NOTE — Anesthesia Procedure Notes (Signed)
Procedure Name: Intubation Date/Time: 05/06/2022 8:08 AM  Performed by: Anastasio Auerbach, CRNAPre-anesthesia Checklist: Patient identified, Emergency Drugs available, Suction available and Patient being monitored Patient Re-evaluated:Patient Re-evaluated prior to induction Oxygen Delivery Method: Circle system utilized Preoxygenation: Pre-oxygenation with 100% oxygen Induction Type: IV induction Ventilation: Mask ventilation without difficulty Laryngoscope Size: Mac and 3 Grade View: Grade I Tube type: Oral Number of attempts: 1 Airway Equipment and Method: Stylet and Oral airway Placement Confirmation: ETT inserted through vocal cords under direct vision, positive ETCO2 and breath sounds checked- equal and bilateral Secured at: 21 cm Tube secured with: Tape Dental Injury: Teeth and Oropharynx as per pre-operative assessment

## 2022-05-06 NOTE — Progress Notes (Signed)
Pt's meds are on hold. Dr Marcie Bal is notified. Per MD to give only Lamotrigine 150 mg for now.

## 2022-05-06 NOTE — ED Notes (Signed)
The pt has been c/o pain for  1-2 hours    I have received an ordr for the pain med and she has decided to hold off now  she reports that the pain is not as bad

## 2022-05-06 NOTE — ED Notes (Signed)
Another full  bag of nss fluid added   foley emptied very little sediment in the bag emptied  1253m

## 2022-05-06 NOTE — Progress Notes (Signed)
Mobility Specialist Progress Note   05/06/22 1322  Mobility  Activity Transferred to/from [facility]  Level of Assistance +2 (takes two people) Hospital doctor)  Information systems manager Ambulated (ft) 4 ft  Activity Response Tolerated well  $Mobility charge 1 Mobility   RN requesting assistantance to help pt transfer to [facility]. ModA to stand d/t limited strength and decreased pain tolerance in RLE + min directional cues needed to safely transfer pt to [facility]. Successful void. Left on [facility] w/ RN still present in room.  [place] Falling Mobility Specialist Please contact via SecureChat or  Rehab office at [phone]

## 2022-05-06 NOTE — ED Notes (Signed)
Trauma Response Nurse Documentation   Latoya Bautista is a 52 y.o. female arriving to [facility] ED via EMS  On No antithrombotic. Trauma was activated as a Level 2 by ED charge RN based on the following trauma criteria Stable femur, humerus, or pelvic fracture via any mechanism except GLF. Trauma team at the bedside on patient arrival.   GCS 15.  History   Past Medical History:  Diagnosis Date   Anemia    Cancer ([place])    tongue cancer   H/O echocardiogram    a. 07/2017: echo showing EF of 55-60%, Grade 1 DD, and no significant valve abnormalities.    Hepatitis C    Hepatitis C 04/11/2011   Leukemia ([place])    Liver cirrhosis ([place]) 04/11/2011   Osteoporosis    Pulmonary fibrosis (HCC)    Seizures (HCC)    Shingles    Thrombocytopenia ([place]) 04/11/2011   Thyroid disease      Past Surgical History:  Procedure Laterality Date   BONE MARROW BIOPSY  05/07/2011   BONE MARROW TRANSPLANT     BUNIONECTOMY     CHOLECYSTECTOMY     liver biopsie     TONGUE BIOPSY     tongue cancer     surgical removal of area       Initial Focused Assessment (If applicable, or please see trauma documentation): Alert/oriented female arrives via EMS after a fall down one step resulting in right leg pain/deformity. Arrives in EMS splint. CMS intact. No IV access, received two doses intranasal fentanyl. Hx leukemia.  Airway patent/unobstructed, BS clear No obvious uncontrolled hemorrhage GCS 15  CT's Completed:   Deferred by EDP   Interventions:  IV start Lab draw Portable pelvis and right femur XRAYS Fentanyl for pain control  Plan for disposition:  Admission to floor   Consults completed:  Orthopaedic Surgeon at [employer].  Event Summary:  [facility] EMS from [facility] after a fall, reports missing a step and leg snapped. Obvious deformity noted, arrives in EMS splint.   Bedside handoff with ED RN [place].    Kim Lauver O Haylen Shelnutt  Trauma Response RN  Please call TRN at [phone] for  further assistance.

## 2022-05-06 NOTE — Interval H&P Note (Signed)
History and Physical Interval Note:  05/06/2022 7:46 AM  Latoya Bautista  has presented today for surgery, with the diagnosis of Right femur fracture.  The various methods of treatment have been discussed with the patient and family. After consideration of risks, benefits and other options for treatment, the patient has consented to  Procedure(s): INTRAMEDULLARY (IM) NAIL FEMORAL (Right) as a surgical intervention.  The patient's history has been reviewed, patient examined, no change in status, stable for surgery.  I have reviewed the patient's chart and labs.  Questions were answered to the patient's satisfaction.     Lennette Bihari P Nazim Kadlec

## 2022-05-07 ENCOUNTER — Ambulatory Visit: Payer: Medicaid Other | Admitting: Podiatry

## 2022-05-07 DIAGNOSIS — N179 Acute kidney failure, unspecified: Secondary | ICD-10-CM

## 2022-05-07 DIAGNOSIS — E871 Hypo-osmolality and hyponatremia: Secondary | ICD-10-CM

## 2022-05-07 DIAGNOSIS — Z862 Personal history of diseases of the blood and blood-forming organs and certain disorders involving the immune mechanism: Secondary | ICD-10-CM | POA: Diagnosis not present

## 2022-05-07 DIAGNOSIS — I10 Essential (primary) hypertension: Secondary | ICD-10-CM

## 2022-05-07 DIAGNOSIS — E875 Hyperkalemia: Secondary | ICD-10-CM

## 2022-05-07 DIAGNOSIS — D696 Thrombocytopenia, unspecified: Secondary | ICD-10-CM | POA: Diagnosis not present

## 2022-05-07 DIAGNOSIS — G40109 Localization-related (focal) (partial) symptomatic epilepsy and epileptic syndromes with simple partial seizures, not intractable, without status epilepticus: Secondary | ICD-10-CM | POA: Diagnosis not present

## 2022-05-07 DIAGNOSIS — S7291XA Unspecified fracture of right femur, initial encounter for closed fracture: Secondary | ICD-10-CM | POA: Diagnosis not present

## 2022-05-07 DIAGNOSIS — M80051A Age-related osteoporosis with current pathological fracture, right femur, initial encounter for fracture: Secondary | ICD-10-CM

## 2022-05-07 LAB — RENAL FUNCTION PANEL
Albumin: 3.5 g/dL (ref 3.5–5.0)
Anion gap: 9 (ref 5–15)
BUN: 17 mg/dL (ref 6–20)
CO2: 21 mmol/L — ABNORMAL LOW (ref 22–32)
Calcium: 9 mg/dL (ref 8.9–10.3)
Chloride: 101 mmol/L (ref 98–111)
Creatinine, Ser: 1.49 mg/dL — ABNORMAL HIGH (ref 0.44–1.00)
GFR, Estimated: 42 mL/min — ABNORMAL LOW (ref >60)
Glucose, Bld: 124 mg/dL — ABNORMAL HIGH (ref 70–99)
Phosphorus: 3.3 mg/dL (ref 2.5–4.6)
Potassium: 5.2 mmol/L — ABNORMAL HIGH (ref 3.5–5.1)
Sodium: 131 mmol/L — ABNORMAL LOW (ref 135–145)

## 2022-05-07 LAB — CBC
HCT: 29.9 % — ABNORMAL LOW (ref 36.0–46.0)
Hemoglobin: 10 g/dL — ABNORMAL LOW (ref 12.0–15.0)
MCH: 33.7 pg (ref 26.0–34.0)
MCHC: 33.4 g/dL (ref 30.0–36.0)
MCV: 100.7 fL — ABNORMAL HIGH (ref 80.0–100.0)
Platelets: 179 10*3/uL (ref 150–400)
RBC: 2.97 MIL/uL — ABNORMAL LOW (ref 3.87–5.11)
RDW: 12.6 % (ref 11.5–15.5)
WBC: 14.9 10*3/uL — ABNORMAL HIGH (ref 4.0–10.5)
nRBC: 0 % (ref 0.0–0.2)

## 2022-05-07 LAB — VITAMIN D 25 HYDROXY (VIT D DEFICIENCY, FRACTURES): Vit D, 25-Hydroxy: 52.11 ng/mL (ref 30–100)

## 2022-05-07 LAB — MAGNESIUM: Magnesium: 1.9 mg/dL (ref 1.7–2.4)

## 2022-05-07 MED ORDER — SODIUM CHLORIDE 0.9 % IV SOLN: INTRAVENOUS | Status: DC

## 2022-05-07 MED ORDER — ACETAMINOPHEN 500 MG PO TABS
1000.0000 mg | ORAL_TABLET | Freq: Four times a day (QID) | ORAL | Status: DC | PRN
  Administered 2022-05-07 – 2022-05-08 (×2): 1000 mg via ORAL
  Filled 2022-05-07 (×2): qty 2

## 2022-05-07 MED ORDER — MAGIC MOUTHWASH W/LIDOCAINE
5.0000 mL | Freq: Four times a day (QID) | ORAL | Status: DC
  Administered 2022-05-07 – 2022-05-10 (×4): 5 mL via ORAL
  Filled 2022-05-07 (×15): qty 5

## 2022-05-07 NOTE — Evaluation (Signed)
Occupational Therapy Evaluation Patient Details Name: Latoya Bautista MRN: [id number] DOB: 22-Apr-1971 Today's Date: 05/07/2022   History of Present Illness Pt is 52 yo female who presented on 05/05/22 after a fall resulting in R femur fx. Pt underwent IM nailing by Dr [name] on 05/06/22. PMH: leukemia, tongue cancer, liver cirrhosis, pulmonary fibrosis, hep C.   Clinical Impression   Pt in recliner upon therapy arrival and agreeable to participate in OT evaluation. Pt reports no pain in RLE although requesting repositioning with pillow placed on seat due to discomfort in buttocks. Prior to admit, pt was independent with all BADL tasks requiring no DME. Currently, pt is experiencing decreased standing balance, and endurance, and increased pain intermittently in RLE requiring increased physical assistance with BADL tasks. Recommend SNF short term to focus on mentioned deficits and return home safely. Complete one more treatment session to educate patient on AE available LB ADL.       Recommendations for follow up therapy are one component of a multi-disciplinary discharge planning process, led by the attending physician.  Recommendations may be updated based on patient status, additional functional criteria and insurance authorization.   Follow Up Recommendations  Skilled nursing-short term rehab (<3 hours/day)     Assistance Recommended at Discharge PRN  Patient can return home with the following A little help with walking and/or transfers;A little help with bathing/dressing/bathroom;Assistance with cooking/housework;Help with stairs or ramp for entrance;Assist for transportation    Functional Status Assessment  Patient has had a recent decline in their functional status and demonstrates the ability to make significant improvements in function in a reasonable and predictable amount of time.  Equipment Recommendations  BSC/3in1       Precautions / Restrictions Precautions Precautions:  Fall Restrictions Weight Bearing Restrictions: Yes RLE Weight Bearing: Weight bearing as tolerated      Mobility Bed Mobility Overal bed mobility:  (Pt up in recliner upon therapy arrival)       Patient Response: Cooperative  Transfers Overall transfer level: Needs assistance Equipment used: Rolling walker (2 wheels) Transfers: Sit to/from Stand, Bed to chair/wheelchair/BSC Sit to Stand: Supervision     Step pivot transfers: Supervision            Balance Overall balance assessment: Needs assistance Sitting-balance support: Feet unsupported, No upper extremity supported Sitting balance-Leahy Scale: Good     Standing balance support: Single extremity supported, During functional activity Standing balance-Leahy Scale: Poor Standing balance comment: light support used form RW while completing toilet hygiene.       ADL either performed or assessed with clinical judgement   ADL Overall ADL's : Needs assistance/impaired     Grooming: Wash/dry hands;Standing;Supervision/safety Grooming Details (indicate cue type and reason): at sink after toileting Upper Body Bathing: Set up;Sitting   Lower Body Bathing: Moderate assistance;Sitting/lateral leans;Sit to/from stand   Upper Body Dressing : Set up;Sitting   Lower Body Dressing: Moderate assistance;Sit to/from stand;Sitting/lateral leans Lower Body Dressing Details (indicate cue type and reason): assist needed for socks Toilet Transfer: Nature conservation officer;Ambulation;Rolling walker (2 wheels) (CGA assist with VC for safe technique and use of grab bar)   Toileting- Clothing Manipulation and Hygiene: Supervision/safety;Sit to/from stand Toileting - Clothing Manipulation Details (indicate cue type and reason): left arm maintained on RW for balance assist             Vision Baseline Vision/History: 1 Wears glasses Ability to See in Adequate Light: 0 Adequate Patient Visual Report: No change from  baseline Vision Assessment?:  No apparent visual deficits            Pertinent Vitals/Pain Pain Assessment Pain Assessment: 0-10 Pain Score: 1  Pain Location: buttocks from sitting Pain Descriptors / Indicators: Sore Pain Intervention(s): Limited activity within patient's tolerance, Monitored during session, Repositioned     Hand Dominance Right   Extremity/Trunk Assessment Upper Extremity Assessment Upper Extremity Assessment: Overall WFL for tasks assessed   Lower Extremity Assessment Lower Extremity Assessment: Defer to PT evaluation       Communication Communication Communication: No difficulties   Cognition Arousal/Alertness: Awake/alert Behavior During Therapy: WFL for tasks assessed/performed Overall Cognitive Status: Within Functional Limits for tasks assessed             General Comments  HR increased to 120 bpm initially with functional mobility although decreased to 112 bpm when arrived to bathroom            Home Living Family/patient expects to be discharged to:: Skilled nursing facility Living Arrangements: Spouse/significant other Available Help at Discharge: Family;Available PRN/intermittently Type of Home: House Home Access: Stairs to enter [employer] of Steps: 2 Entrance Stairs-Rails: Left Home Layout: Two level;Bed/bath upstairs Alternate Level Stairs-Number of Steps: flight Alternate Level Stairs-Rails: Right Bathroom Shower/Tub: Other (comment) (walk in tub)   Bathroom Toilet: Handicapped height Bathroom Accessibility: No   Home Equipment: None   Additional Comments: husband works, church members have offered to help as needed      Prior Functioning/Environment Prior Level of Function : Independent/Modified Independent;Driving             Mobility Comments[facility]          OT Problem List: Impaired balance (sitting and/or standing);Decreased knowledge of use of  DME or AE;Decreased strength;Pain      OT Treatment/Interventions: Self-care/ADL training;Therapeutic activities;Therapeutic exercise;Energy conservation;DME and/or AE instruction;Patient/family education;Balance training;Modalities    OT Goals(Current goals can be found in the care plan section) Acute Rehab OT Goals Patient Stated Goal: to go to rehab OT Goal Formulation: With patient Time For Goal Achievement: 05/14/22 Potential to Achieve Goals: Good  OT Frequency: Min 2X/week       AM-PAC OT "6 Clicks" Daily Activity     Outcome Measure Help from another person eating meals?: None Help from another person taking care of personal grooming?: None Help from another person toileting, which includes using toliet, bedpan, or urinal?: A Little Help from another person bathing (including washing, rinsing, drying)?: A Little Help from another person to put on and taking off regular upper body clothing?: None Help from another person to put on and taking off regular lower body clothing?: A Lot 6 Click Score: 20   End of Session Equipment Utilized During Treatment: Rolling walker (2 wheels)  Activity Tolerance: Patient tolerated treatment well Patient left: in chair;with call bell/phone within reach;with nursing/sitter in room  OT Visit Diagnosis: History of falling (Z91.81);Muscle weakness (generalized) (M62.81)                Time: 3295-1884 OT Time Calculation (min): 29 min Charges:  OT General Charges $OT Visit: 1 Visit OT Evaluation $OT Eval Moderate Complexity: 1 Mod  [employer], OTR/L,CBIS  Supplemental OT - [facility] and [facility]   Ekaterina Denise, Clarene Duke 05/07/2022, 10:30 AM

## 2022-05-07 NOTE — Progress Notes (Signed)
Physical Therapy Treatment Patient Details Name: Latoya Bautista MRN: [id number] DOB: 1970-10-25 Today's Date: 05/07/2022   History of Present Illness Pt is 52 y/o female who presented on 05/05/22 after a fall resulting in R femur fx. Pt underwent IM nailing by Dr [name] on 05/06/22. PMH: leukemia, tongue cancer, liver cirrhosis, pulmonary fibrosis, hep C.    PT Comments    Pt progressing towards physical therapy goals. Was able to progress ambulation distance this session however pt reports she is limited by pain. Focus of session was ambulation to the bathroom, and return to bed after hallway ambulation as pt reports the chair hurts her hip. Pt was repositioned in bed at end of session and set up for lunch. Will continue to follow and progress as able per POC.     Recommendations for follow up therapy are one component of a multi-disciplinary discharge planning process, led by the attending physician.  Recommendations may be updated based on patient status, additional functional criteria and insurance authorization.  Follow Up Recommendations  Skilled nursing-short term rehab (<3 hours/day) Can patient physically be transported by private vehicle: Yes   Assistance Recommended at Discharge Frequent or constant Supervision/Assistance  Patient can return home with the following A lot of help with walking and/or transfers;A lot of help with bathing/dressing/bathroom;Assistance with cooking/housework;Assist for transportation;Help with stairs or ramp for entrance   Equipment Recommendations  Rolling walker (2 wheels)    Recommendations for Other Services OT consult     Precautions / Restrictions Precautions Precautions: Fall Restrictions Weight Bearing Restrictions: Yes RLE Weight Bearing: Weight bearing as tolerated     Mobility  Bed Mobility Overal bed mobility: Needs Assistance Bed Mobility: Sit to Supine       Sit to supine: Min guard   General bed mobility comments: HOB flat  and no use of rails. Pt was able to swing LE's up into bed at end of session and reposition herself in bed.    Transfers Overall transfer level: Needs assistance Equipment used: Rolling walker (2 wheels) Transfers: Sit to/from Stand Sit to Stand: Min guard           General transfer comment: Close guard for safety as pt powered up to full stand. No assist required but increased time taken. 1 uncontrolled descent back down onto toilet after attempting to lift gown before getting her balance fully.    Ambulation/Gait Ambulation/Gait assistance: Min guard Gait Distance (Feet): 100 Feet Assistive device: Rolling walker (2 wheels) Gait Pattern/deviations: Step-to pattern Gait velocity: decreased Gait velocity interpretation: <1.8 ft/sec, indicate of risk for recurrent falls   General Gait Details: VC's for improved posture, closer walker proximity, and forward gaze. No assist required but hands on guarding provided for safety.   Stairs             Wheelchair Mobility    Modified Rankin (Stroke Patients Only)       Balance Overall balance assessment: Needs assistance Sitting-balance support: Feet unsupported, No upper extremity supported Sitting balance-Leahy Scale: Good     Standing balance support: Single extremity supported, During functional activity Standing balance-Leahy Scale: Poor Standing balance comment: light support used form RW while completing toilet hygiene.                            Cognition Arousal/Alertness: Awake/alert Behavior During Therapy: WFL for tasks assessed/performed Overall Cognitive Status: Within Functional Limits for tasks assessed  Exercises      General Comments General comments (skin integrity, edema, etc.): HR increased to 120 bpm initially with functional mobility although decreased to 112 bpm when arrived to bathroom      Pertinent Vitals/Pain Pain  Assessment Pain Assessment: Faces Faces Pain Scale: Hurts little more Pain Location: R hip Pain Descriptors / Indicators: Sore, Operative site guarding Pain Intervention(s): Limited activity within patient's tolerance, Monitored during session, Repositioned    Home Living Family/patient expects to be discharged to:: Skilled nursing facility Living Arrangements: Spouse/significant other Available Help at Discharge: Family;Available PRN/intermittently Type of Home: House Home Access: Stairs to enter Entrance Stairs-Rails: Left Entrance Stairs-Number of Steps: 2 Alternate Level Stairs-Number of Steps: flight Home Layout: Two level;Bed/bath upstairs Home Equipment: None Additional Comments: husband works, church members have offered to help as needed    Prior Function            PT Goals (current goals can now be found in the care plan section) Acute Rehab PT Goals Patient Stated Goal: pt used to work at [employer], would like to go to rehab there then home PT Goal Formulation: With patient Time For Goal Achievement: 05/20/22 Potential to Achieve Goals: Good Progress towards PT goals: Progressing toward goals    Frequency    Min 3X/week      PT Plan Current plan remains appropriate    Co-evaluation              AM-PAC PT "6 Clicks" Mobility   Outcome Measure  Help needed turning from your back to your side while in a flat bed without using bedrails?: A Little Help needed moving from lying on your back to sitting on the side of a flat bed without using bedrails?: A Little Help needed moving to and from a bed to a chair (including a wheelchair)?: A Little Help needed standing up from a chair using your arms (e.g., wheelchair or bedside chair)?: A Little Help needed to walk in hospital room?: A Little Help needed climbing 3-5 steps with a railing? : A Little 6 Click Score: 18    End of Session Equipment Utilized During Treatment: Gait belt Activity Tolerance:  Patient tolerated treatment well Patient left: in bed;with bed alarm set;with call bell/phone within reach Nurse Communication: Mobility status PT Visit Diagnosis: Unsteadiness on feet (R26.81);Difficulty in walking, not elsewhere classified (R26.2);Pain Pain - Right/Left: Right Pain - part of body: Hip     Time: 0737-1062 PT Time Calculation (min) (ACUTE ONLY): 38 min  Charges:  $Gait Training: 38-52 mins                     Rolinda Roan, PT, DPT Acute Rehabilitation Services Secure Chat Preferred Office: [phone]    Thelma Comp 05/07/2022, 1:10 PM

## 2022-05-07 NOTE — TOC Initial Note (Signed)
Transition of Care [facility]) - Initial/Assessment Note    Patient Details  Name: Latoya Bautista MRN: [id number] Date of Birth: [birth year]  [facility] CM/SW Contact:    Joanne Chars, LCSW Phone Number: 05/07/2022, 4:13 PM  Clinical Narrative:    CSW met with pt regarding DC recommendation for SNF.  Pt agreeable to this, lives in [place] and would prefer facility in [place]. [facility].  Pt lives at home with husband, permission given to speak with him, permission given to send out referral in hub.  No current services at home.                Expected Discharge Plan: Skilled Nursing Facility Barriers to Discharge: Continued Medical Work up, SNF Pending bed offer   Patient Goals and CMS Choice Patient states their goals for this hospitalization and ongoing recovery are:: be able to [facility]          Expected Discharge Plan and Services In-house Referral: Clinical Social Work   Post Acute Care Choice: [place] Living arrangements for the past 2 months: [place]                                      Prior Living Arrangements/Services Living arrangements for the past 2 months: Single Family Home Lives with:: Spouse Patient language and need for interpreter reviewed:: Yes Do you feel safe going back to the place where you live?: Yes      Need for Family Participation in Patient Care: No (Comment) Care giver support system in place?: Yes (comment) Current home services: Other (comment) (none) Criminal Activity/Legal Involvement Pertinent to Current Situation/Hospitalization: No - Comment as needed  Activities of Daily Living Home Assistive Devices/Equipment: None ADL Screening (condition at time of admission) Patient's cognitive ability adequate to safely complete daily activities?: Yes Is the patient deaf or have difficulty hearing?: No Does the patient have difficulty seeing, even  when wearing glasses/contacts?: No Does the patient have difficulty concentrating, remembering, or making decisions?: No Patient able to express need for assistance with ADLs?: Yes Does the patient have difficulty dressing or bathing?: No Independently performs ADLs?: Yes (appropriate for developmental age) Does the patient have difficulty walking or climbing stairs?: Yes Weakness of Legs: Right Weakness of Arms/Hands: None  Permission Sought/Granted Permission sought to share information with : Family Supports Permission granted to share information with : Yes, Verbal Permission Granted  Share Information with NAME: husband Shanon Brow           Emotional Assessment Appearance:: Appears stated age Attitude/Demeanor/Rapport: Engaged Affect (typically observed): Appropriate, Pleasant Orientation: : Oriented to Self, Oriented to Place, Oriented to  Time, Oriented to Situation      Admission diagnosis:  Femur fracture, right ([place]) [S72.91XA] Closed displaced oblique fracture of shaft of right femur, initial encounter ([place]) [S72.331A] Patient Active Problem List   Diagnosis Date Noted   AKI (acute kidney injury) ([place]) 05/07/2022   Chronic hyponatremia 05/07/2022   Femur fracture, right ([place]) 05/05/2022   Malignant neoplasm of tongue, tip and lateral border ([place]) 05/30/2021   Bone marrow transplant status ([place]) 05/22/2021   ILD (interstitial lung disease) ([place]) 04/18/2021   Squamous cell carcinoma of lateral tongue ([place]) 12/12/2020   Melanoma in situ of skin of buttock ([place]) 08/27/2018   Vulvar lesion 06/09/2018   History of thrombocytopenia  12/11/2015   Well woman exam with routine gynecological exam 10/18/2013   Localization-related epilepsy ([place]) 01/01/2013   Partial epilepsy with impairment of consciousness ([place]) 12/15/2012   Postmenopausal bleeding DUE TO hormone tx. 10/08/2012   History of hepatitis C 04/11/2011   Liver cirrhosis ([place]) 04/11/2011   Thrombocytopenia ([place])  04/11/2011   PCP:  [name], MD Pharmacy:   [facility], [address] [place] [address] [address] Phone: [phone] Fax: [place], [address] [place] [address] Phone: [phone] Fax: [phone]     Social Determinants of Health (SDOH) Social History: [place]: No Food Insecurity (05/06/2022)  Recent Concern: [place] Present (03/15/2022)  Housing: Low Risk  (05/06/2022)  Transportation Needs: No Transportation Needs (05/06/2022)  Utilities: Not At Risk (05/06/2022)  Alcohol Screen: Low Risk  (03/15/2022)  Depression (PHQ2-9): Low Risk  (03/15/2022)  Financial Resource Strain: High Risk (03/15/2022)  Physical Activity: Sufficiently Active (03/15/2022)  Social Connections: Socially Integrated (03/15/2022)  Stress: No Stress Concern Present (03/15/2022)  Tobacco Use: Low Risk  (05/06/2022)   SDOH Interventions:     Readmission Risk Interventions     No data to display

## 2022-05-07 NOTE — Progress Notes (Signed)
Orthopaedic Trauma Progress Note  SUBJECTIVE: Doing well today in terms of her right leg this morning. Pain minimal. Was able to get up and mobilize some with therapies yesterday afternoon following surgery. Doesn't feel like she needs her Tylenol scheduled. Patient concerned about her sodium level and the fact that it has dropped some since admission. She has a history of chronic hyponatremia and has been hospitalized in the past for this and does not want to get to this point again. She normally consumes several electrolyte beverages daily to maintain normal sodium levels. She is requesting we change diet to regular vs heart healthy. I will KVO IV fluids as well today. No chest pain. No SOB. No nausea/vomiting. Denies numbness or tingling throughout extremity.  OBJECTIVE:  Vitals:   05/07/22 0116 05/07/22 0416  BP: (!) 114/56 (!) 101/57  Pulse:  91  Resp: (!) 21 19  Temp: 98.1 F (36.7 C) (!) 97.5 F (36.4 C)  SpO2: 97% 100%    General: Sitting up in bed, NAD. Eating breakfast Respiratory: No increased work of breathing.  RLE: Dressing CDI. Soreness through the thigh as expected. Swelling stable. Ankle DF/PF intact. Endorses sensation throughout extremity. Foot warm and well perfused. Neurovascularly intact  IMAGING: Stable post op imaging.   LABS:  Results for orders placed or performed during the hospital encounter of 05/05/22 (from the past 24 hour(s))  CBC     Status: Abnormal   Collection Time: 05/07/22  4:17 AM  Result Value Ref Range   WBC 14.9 (H) 4.0 - 10.5 K/uL   RBC 2.97 (L) 3.87 - 5.11 MIL/uL   Hemoglobin 10.0 (L) 12.0 - 15.0 g/dL   HCT 29.9 (L) 36.0 - 46.0 %   MCV 100.7 (H) 80.0 - 100.0 fL   MCH 33.7 26.0 - 34.0 pg   MCHC 33.4 30.0 - 36.0 g/dL   RDW 12.6 11.5 - 15.5 %   Platelets 179 150 - 400 K/uL   nRBC 0.0 0.0 - 0.2 %  Renal function panel     Status: Abnormal   Collection Time: 05/07/22  4:17 AM  Result Value Ref Range   Sodium 131 (L) 135 - 145 mmol/L    Potassium 5.2 (H) 3.5 - 5.1 mmol/L   Chloride 101 98 - 111 mmol/L   CO2 21 (L) 22 - 32 mmol/L   Glucose, Bld 124 (H) 70 - 99 mg/dL   BUN 17 6 - 20 mg/dL   Creatinine, Ser 1.49 (H) 0.44 - 1.00 mg/dL   Calcium 9.0 8.9 - 10.3 mg/dL   Phosphorus 3.3 2.5 - 4.6 mg/dL   Albumin 3.5 3.5 - 5.0 g/dL   GFR, Estimated 42 (L) >60 mL/min   Anion gap 9 5 - 15  Magnesium     Status: None   Collection Time: 05/07/22  4:17 AM  Result Value Ref Range   Magnesium 1.9 1.7 - 2.4 mg/dL    ASSESSMENT: Latoya Bautista is a 52 y.o. female, 1 Day Post-Op s/p INTRAMEDULLARY NAIL RIGHT FEMUR  CV/Blood loss: Acute blood loss anemia, Hgb 10.0 this AM. Hemodynamically stable  PLAN: Weightbearing: WBAT RLE ROM:  Ok for hip and knee ROM as tolerated  Incisional and dressing care: Reinforce dressings as needed  Showering: OK to begin showering and getting incisions wet 05/09/21 Orthopedic device(s): None  Pain management:  1. Tylenol 1000 mg q 6 hours PRN 2. Robaxin 500 mg q 6 hours PRN 3. Oxycodone 5-10 mg q 4 hours PRN 4. Dilaudid  1-2 mg q 2 hours PRN VTE prophylaxis: Lovenox, SCDs ID:  Ancef 2gm post op Foley/Lines:  No foley, KVO IVFs Impediments to Fracture Healing: Vit D level 52, continue home dose supplementation Dispo: PT/OT eval ongoing, currently recommending SNF. Plan to remove dressings R femur tomorrow 05/08/22  D/C recommendations: - Oxycodone 5 mg for pain control - Eliquis 2.5 mg BID x 30 days for DVT prophylaxis - Continue home dose Vit D supplementation  Follow - up plan: 2 weeks after d/c for wound check and repeat x-rays   Contact information:  Katha Hamming MD, Rushie Nyhan PA-C. After hours and holidays please check [email or web address] for group call information for [facility]   Gwinda Passe, PA-C [phone] (office) [email or web address]

## 2022-05-07 NOTE — Progress Notes (Addendum)
PROGRESS NOTE  Latoya Bautista EHU:[id number] DOB: October 18, 1970   PCP: [name], MD  Patient is from: Home.  DOA: 05/05/2022 LOS: 2  Chief complaints Chief Complaint  Patient presents with   Fall    Level 2     Brief Narrative / Interim history: 52 year old F with PMH of leukemia, tongue cancer s/p resection, hepatitis C, liver cirrhosis, pulmonary fibrosis, seizure and chronic hyponatremia admitted with right hip fracture after fall at home.  Patient underwent intramedullary nailing on 05/06/2021.  She also has AKI.  Therapy recommended SNF.   Subjective: Seen and examined earlier this morning.  No major events overnight of this morning.  No complaints.  Pain fairly controlled.  AKI and hyponatremia slightly worse.  Objective: Vitals:   05/07/22 0116 05/07/22 0416 05/07/22 0802 05/07/22 1433  BP: (!) 114/56 (!) 101/57 (!) 112/59 (!) 97/50  Pulse:  91 (!) 101 100  Resp: (!) '21 19 19   '$ Temp: 98.1 F (36.7 C) (!) 97.5 F (36.4 C) 97.8 F (36.6 C) 98.2 F (36.8 C)  TempSrc: Oral Oral Oral Oral  SpO2: 97% 100% 100% 99%  Weight:      Height:        Examination:  GENERAL: No apparent distress.  Nontoxic. HEENT: MMM.  Vision and hearing grossly intact.  NECK: Supple.  No apparent JVD.  RESP:  No IWOB.  Fair aeration bilaterally. CVS:  RRR. Heart sounds normal.  ABD/GI/GU: BS+. Abd soft, NTND.  MSK/EXT:  Moves extremities. No apparent deformity. No edema.  SKIN: Dressing over right thigh DCI. NEURO: Awake, alert and oriented appropriately.  No apparent focal neuro deficit. PSYCH: Calm. Normal affect.   Procedures:  05/06/2022-intramedullary nailing of right femoral fracture  Microbiology summarized: MRSA PCR screen negative.  Assessment and plan: Principal Problem:   Pathological fracture of right femur due to osteoporosis [facility]) Active Problems:   History of hepatitis C   Liver cirrhosis (HCC)   Thrombocytopenia (HCC)   Localization-related epilepsy (HCC)    Melanoma in situ of skin of buttock (HCC)   History of thrombocytopenia   ILD (interstitial lung disease) ([place])   Bone marrow transplant status ([place])   Femur fracture, right (HCC)   AKI (acute kidney injury) ([place])   Chronic hyponatremia  Accidental fall at home Osteoporotic pathological fracture of right femur  -S/p intramedullary nailing by Dr. [name] on 05/06/2022. -Pain control and DVT prophylaxis per surgery -Bowel regimen as needed for constipation -PT/OT-recommended SNF.   Acute kidney injury: B/l Cr 0.7-0.8. Slightly worse today. Recent Labs    11/07/21 1534 05/05/22 1858 05/06/22 0145 05/07/22 0417  BUN '11 12 12 17  '$ CREATININE 0.78 1.31* 1.21* 1.49*  -IVF discontinued by orthopedic surgery -Encourage oral hydration and recheck in the morning -Discontinue Benicar  Hyponatremia/mild hyperkalemia: Chronic hyponatremia.  Baseline about 130.  IVF discontinued as above. -Recheck in the morning.  Hypothyroidism: -Continue home Synthroid   Dyslipidemia: -Continue home Tricor and Crestor   Hypertension: Soft blood pressures. -Discontinue Benicar. -Continue to monitor closely.   Seizure: -Lamictal    GERD: -Protonix.   Osteoporosis: -Forteo and vitamin D    ILD (interstitial lung disease) ([place]): -Nebulizers as needed.   -Outpatient follow-up with pulmonology   Hepatitis C/liver cirrhosis: -Treated for hepatitis C.   Melanoma in situ of skin of buttock (HCC)   Body mass index is 28.28 kg/m.          DVT prophylaxis:  SCDs Start: 05/06/22 1047 enoxaparin (LOVENOX) injection 40  mg Start: 05/05/22 2045 SCDs Start: 05/05/22 2038  Code Status: Full code Family Communication: None at bedside Level of care: Med-Surg Status is: Inpatient Remains inpatient appropriate because: Right femoral fracture and hyperkalemia   Final disposition: SNF Consultants:  Orthopedic surgery  Sch Meds:  Scheduled Meds:  docusate sodium  100 mg Oral BID    enoxaparin (LOVENOX) injection  40 mg Subcutaneous Q24H   estradiol  1 mg Oral Daily   famotidine  20 mg Oral Daily   fenofibrate  160 mg Oral Daily   folic acid  1 mg Oral Daily   lamoTRIgine  150 mg Oral BID   levothyroxine  75 mcg Oral Daily   magic mouthwash w/lidocaine  5 mL Oral QID   metoprolol tartrate  50 mg Oral BID   neomycin-bacitracin-polymyxin   Topical Daily   pantoprazole  40 mg Oral Daily   progesterone  100 mg Oral Daily   rosuvastatin  40 mg Oral Daily   Teriparatide (Recombinant)  20 mcg Subcutaneous Daily   Vitamin D (Ergocalciferol)  50,000 Units Oral Q14 Days   Continuous Infusions:  methocarbamol (ROBAXIN) IV     PRN Meds:.acetaminophen, methocarbamol **OR** methocarbamol (ROBAXIN) IV, metoCLOPramide **OR** metoCLOPramide (REGLAN) injection, morphine injection, ondansetron **OR** ondansetron (ZOFRAN) IV, oxyCODONE, polyethylene glycol, senna-docusate  Antimicrobials: Anti-infectives (From admission, onward)    Start     Dose/Rate Route Frequency Ordered Stop   05/06/22 1600  ceFAZolin (ANCEF) IVPB 2g/100 mL premix        2 g 200 mL/hr over 30 Minutes Intravenous Every 8 hours 05/06/22 1046 05/07/22 0558   05/06/22 0800  ceFAZolin (ANCEF) IVPB 2g/100 mL premix        2 g 200 mL/hr over 30 Minutes Intravenous To Short Stay 05/06/22 0706 05/06/22 0828   05/06/22 0752  ceFAZolin (ANCEF) 2-4 GM/100ML-% IVPB       Note to Pharmacy: Anastasio Auerbach: cabinet override      05/06/22 0752 05/06/22 0825        I have personally reviewed the following labs and images: CBC: Recent Labs  Lab 05/05/22 1858 05/06/22 0145 05/07/22 0417  WBC 8.5 10.6* 14.9*  NEUTROABS 5.1  --   --   HGB 12.9 11.4* 10.0*  HCT 38.6 32.7* 29.9*  MCV 99.2 97.6 100.7*  PLT 210 187 179   BMP &GFR Recent Labs  Lab 05/05/22 1858 05/06/22 0145 05/07/22 0417  NA 131* 135 131*  K 4.5 4.8 5.2*  CL 99 101 101  CO2 23 23 21*  GLUCOSE 120* 110* 124*  BUN '12 12 17  '$ CREATININE  1.31* 1.21* 1.49*  CALCIUM 9.8 9.6 9.0  MG  --   --  1.9  PHOS  --   --  3.3   Estimated Creatinine Clearance: 36.2 mL/min (A) (by C-G formula based on SCr of 1.49 mg/dL (H)). Liver & Pancreas: Recent Labs  Lab 05/05/22 1858 05/07/22 0417  AST 40  --   ALT 26  --   ALKPHOS 79  --   BILITOT 0.4  --   PROT 7.3  --   ALBUMIN 4.2 3.5   No results for input(s): "LIPASE", "AMYLASE" in the last 168 hours. No results for input(s): "AMMONIA" in the last 168 hours. Diabetic: No results for input(s): "HGBA1C" in the last 72 hours. No results for input(s): "GLUCAP" in the last 168 hours. Cardiac Enzymes: No results for input(s): "CKTOTAL", "CKMB", "CKMBINDEX", "TROPONINI" in the last 168 hours. No results for input(s): "PROBNP"  in the last 8760 hours. Coagulation Profile: No results for input(s): "INR", "PROTIME" in the last 168 hours. Thyroid Function Tests: No results for input(s): "TSH", "T4TOTAL", "FREET4", "T3FREE", "THYROIDAB" in the last 72 hours. Lipid Profile: No results for input(s): "CHOL", "HDL", "LDLCALC", "TRIG", "CHOLHDL", "LDLDIRECT" in the last 72 hours. Anemia Panel: No results for input(s): "VITAMINB12", "FOLATE", "FERRITIN", "TIBC", "IRON", "RETICCTPCT" in the last 72 hours. Urine analysis:    Component Value Date/Time   COLORURINE YELLOW 07/28/2014 0600   APPEARANCEUR CLEAR 07/28/2014 0600   LABSPEC >1.030 (H) 07/28/2014 0600   PHURINE 5.5 07/28/2014 0600   GLUCOSEU NEGATIVE 07/28/2014 0600   HGBUR NEGATIVE 07/28/2014 0600   BILIRUBINUR NEGATIVE 07/28/2014 0600   KETONESUR NEGATIVE 07/28/2014 0600   PROTEINUR 100 (A) 07/28/2014 0600   UROBILINOGEN 0.2 07/28/2014 0600   NITRITE NEGATIVE 07/28/2014 0600   LEUKOCYTESUR NEGATIVE 07/28/2014 0600   Sepsis Labs: Invalid input(s): "PROCALCITONIN", "LACTICIDVEN"  Microbiology: Recent Results (from the past 240 hour(s))  Surgical pcr screen     Status: None   Collection Time: 05/06/22  4:09 AM   Specimen: Nasal  Mucosa; Nasal Swab  Result Value Ref Range Status   MRSA, PCR NEGATIVE NEGATIVE Final   Staphylococcus aureus NEGATIVE NEGATIVE Final    Comment: (NOTE) The Xpert SA Assay (FDA approved for NASAL specimens in patients 44 years of age and older), is one component of a comprehensive surveillance program. It is not intended to diagnose infection nor to guide or monitor treatment. Performed at [facility], [place] [address] [place], [place] [address]     Radiology Studies: No results found.    Sisto Granillo T. [place]  If 7PM-7AM, please contact night-coverage [email or web address] 05/07/2022, 9:35 PM

## 2022-05-07 NOTE — NC FL2 (Signed)
[place] LEVEL OF CARE FORM     IDENTIFICATION  Patient Name: Latoya Bautista Birthdate: Feb 22, 1971 Sex: female Admission Date (Current Location): 05/05/2022  [facility] and [place] Number:  Herbalist and Address:  [facility], [place] [address], [place], [place] [address]      Provider Number: [id number]  Attending Physician Name and Address:  Mercy Riding, MD  Relative Name and Phone Number:  Shahla, Betsill [phone]    Current Level of Care: Hospital Recommended Level of Care: [place] Prior Approval Number:    Date Approved/Denied:   PASRR Number: [id number]  Discharge Plan: SNF    Current Diagnoses: Patient Active Problem List   Diagnosis Date Noted   AKI (acute kidney injury) ([place]) 05/07/2022   Chronic hyponatremia 05/07/2022   Femur fracture, right ([place]) 05/05/2022   Malignant neoplasm of tongue, tip and lateral border ([place]) 05/30/2021   Bone marrow transplant status ([place]) 05/22/2021   ILD (interstitial lung disease) ([place]) 04/18/2021   Squamous cell carcinoma of lateral tongue ([place]) 12/12/2020   Melanoma in situ of skin of buttock ([place]) 08/27/2018   Vulvar lesion 06/09/2018   History of thrombocytopenia 12/11/2015   Well woman exam with routine gynecological exam 10/18/2013   Localization-related epilepsy ([place]) 01/01/2013   Partial epilepsy with impairment of consciousness ([place]) 12/15/2012   Postmenopausal bleeding DUE TO hormone tx. 10/08/2012   History of hepatitis C 04/11/2011   Liver cirrhosis ([place]) 04/11/2011   Thrombocytopenia ([place]) 04/11/2011    Orientation RESPIRATION BLADDER Height & Weight     Self, Time, Situation, Place  Normal Continent Weight: 140 lb (63.5 kg) Height:  '4\' 11"'$  (149.9 cm)  BEHAVIORAL SYMPTOMS/MOOD NEUROLOGICAL BOWEL NUTRITION STATUS      Continent Diet (see discharge summary)  AMBULATORY STATUS COMMUNICATION OF NEEDS Skin    Supervision Verbally Surgical wounds                       Personal Care Assistance Level of Assistance  Bathing, Feeding, Dressing Bathing Assistance: Limited assistance Feeding assistance: Independent Dressing Assistance: Limited assistance     Functional Limitations Info  Sight, Hearing, Speech Sight Info: Adequate Hearing Info: Adequate Speech Info: Adequate    SPECIAL CARE FACTORS FREQUENCY  PT (By licensed PT), OT (By licensed OT)     PT Frequency: 5x week OT Frequency: 5x week            Contractures Contractures Info: Not present    Additional Factors Info  Code Status, Allergies Code Status Info: full Allergies Info: Simvastatin, Briviact (Brivaracetam), Asparaginase Derivatives, Elspar (Asparaginase), Keppra (Levetiracetam), Soap, Tape           Current Medications (05/07/2022):  This is the current hospital active medication list Current Facility-Administered Medications  Medication Dose Route Frequency Provider Last Rate Last Admin   acetaminophen (TYLENOL) tablet 1,000 mg  1,000 mg Oral Q6H PRN Thereasa Solo, Sarah A, PA-C       docusate sodium (COLACE) capsule 100 mg  100 mg Oral BID Rushie Nyhan A, PA-C   100 mg at 05/07/22 1016   enoxaparin (LOVENOX) injection 40 mg  40 mg Subcutaneous Q24H Rushie Nyhan A, PA-C   40 mg at 05/06/22 2011   estradiol (ESTRACE) tablet 1 mg  1 mg Oral Daily Corinne Ports, PA-C   1 mg at 05/07/22 1016   famotidine (PEPCID) tablet 20 mg  20 mg Oral Daily Rushie Nyhan  A, PA-C   20 mg at 05/07/22 1016   fenofibrate tablet 160 mg  160 mg Oral Daily Corinne Ports, PA-C   160 mg at 62/94/76 5465   folic acid (FOLVITE) tablet 1 mg  1 mg Oral Daily Corinne Ports, PA-C   1 mg at 05/07/22 1016   lamoTRIgine (LAMICTAL XR) 24 hour tablet 150 mg  150 mg Oral BID Corinne Ports, PA-C   150 mg at 05/07/22 1016   levothyroxine (SYNTHROID) tablet 75 mcg  75 mcg Oral Daily Corinne Ports, PA-C   75 mcg at 05/07/22 0354    magic mouthwash w/lidocaine  5 mL Oral QID Wendee Beavers T, MD   5 mL at 05/07/22 1354   methocarbamol (ROBAXIN) tablet 500 mg  500 mg Oral Q6H PRN Corinne Ports, PA-C   500 mg at 05/06/22 2011   Or   methocarbamol (ROBAXIN) 500 mg in dextrose 5 % 50 mL IVPB  500 mg Intravenous Q6H PRN Corinne Ports, PA-C       metoCLOPramide (REGLAN) tablet 5-10 mg  5-10 mg Oral Q8H PRN Thereasa Solo, Sarah A, PA-C       Or   metoCLOPramide (REGLAN) injection 5-10 mg  5-10 mg Intravenous Q8H PRN Corinne Ports, PA-C       metoprolol tartrate (LOPRESSOR) tablet 50 mg  50 mg Oral BID Corinne Ports, PA-C   50 mg at 05/07/22 1016   morphine (PF) 2 MG/ML injection 1-2 mg  1-2 mg Intravenous Q2H PRN Corinne Ports, PA-C       neomycin-bacitracin-polymyxin (NEOSPORIN) ointment   Topical Daily Corinne Ports, PA-C   Given at 05/07/22 1017   ondansetron (ZOFRAN) tablet 4 mg  4 mg Oral Q6H PRN Corinne Ports, PA-C       Or   ondansetron (ZOFRAN) injection 4 mg  4 mg Intravenous Q6H PRN Rushie Nyhan A, PA-C       oxyCODONE (Oxy IR/ROXICODONE) immediate release tablet 5-10 mg  5-10 mg Oral Q4H PRN Corinne Ports, PA-C   5 mg at 05/06/22 2011   pantoprazole (PROTONIX) EC tablet 40 mg  40 mg Oral Daily Corinne Ports, PA-C   40 mg at 05/07/22 1016   polyethylene glycol (MIRALAX / GLYCOLAX) packet 17 g  17 g Oral Daily PRN Corinne Ports, PA-C       progesterone (PROMETRIUM) capsule 100 mg  100 mg Oral Daily Rushie Nyhan A, PA-C   100 mg at 05/07/22 1016   rosuvastatin (CRESTOR) tablet 40 mg  40 mg Oral Daily Corinne Ports, PA-C   40 mg at 05/07/22 1016   senna-docusate (Senokot-S) tablet 1 tablet  1 tablet Oral QHS PRN Corinne Ports, PA-C       Teriparatide (Recombinant) SOPN 20 mcg  20 mcg Subcutaneous Daily Dana Allan I, MD   20 mcg at 05/06/22 2236   Vitamin D (Ergocalciferol) (DRISDOL) 1.25 MG (50000 UNIT) capsule 50,000 Units  50,000 Units Oral Q14 Days Corinne Ports, PA-C          Discharge Medications: Please see discharge summary for a list of discharge medications.  Relevant Imaging Results:  Relevant Lab Results:   Additional Information SSN: [id number]  Joanne Chars, LCSW

## 2022-05-08 DIAGNOSIS — D696 Thrombocytopenia, unspecified: Secondary | ICD-10-CM | POA: Diagnosis not present

## 2022-05-08 DIAGNOSIS — Z862 Personal history of diseases of the blood and blood-forming organs and certain disorders involving the immune mechanism: Secondary | ICD-10-CM | POA: Diagnosis not present

## 2022-05-08 DIAGNOSIS — G40109 Localization-related (focal) (partial) symptomatic epilepsy and epileptic syndromes with simple partial seizures, not intractable, without status epilepticus: Secondary | ICD-10-CM | POA: Diagnosis not present

## 2022-05-08 DIAGNOSIS — S7291XA Unspecified fracture of right femur, initial encounter for closed fracture: Secondary | ICD-10-CM | POA: Diagnosis not present

## 2022-05-08 DIAGNOSIS — R000 Tachycardia, unspecified: Secondary | ICD-10-CM

## 2022-05-08 LAB — RENAL FUNCTION PANEL
Albumin: 3.3 g/dL — ABNORMAL LOW (ref 3.5–5.0)
Anion gap: 11 (ref 5–15)
BUN: 26 mg/dL — ABNORMAL HIGH (ref 6–20)
CO2: 23 mmol/L (ref 22–32)
Calcium: 9 mg/dL (ref 8.9–10.3)
Chloride: 102 mmol/L (ref 98–111)
Creatinine, Ser: 1.37 mg/dL — ABNORMAL HIGH (ref 0.44–1.00)
GFR, Estimated: 47 mL/min — ABNORMAL LOW (ref >60)
Glucose, Bld: 96 mg/dL (ref 70–99)
Phosphorus: 2.6 mg/dL (ref 2.5–4.6)
Potassium: 4.5 mmol/L (ref 3.5–5.1)
Sodium: 136 mmol/L (ref 135–145)

## 2022-05-08 LAB — CBC
HCT: 27.6 % — ABNORMAL LOW (ref 36.0–46.0)
Hemoglobin: 9.1 g/dL — ABNORMAL LOW (ref 12.0–15.0)
MCH: 33.2 pg (ref 26.0–34.0)
MCHC: 33 g/dL (ref 30.0–36.0)
MCV: 100.7 fL — ABNORMAL HIGH (ref 80.0–100.0)
Platelets: 166 10*3/uL (ref 150–400)
RBC: 2.74 MIL/uL — ABNORMAL LOW (ref 3.87–5.11)
RDW: 12.9 % (ref 11.5–15.5)
WBC: 13.3 10*3/uL — ABNORMAL HIGH (ref 4.0–10.5)
nRBC: 0 % (ref 0.0–0.2)

## 2022-05-08 LAB — TSH: TSH: 0.112 u[IU]/mL — ABNORMAL LOW (ref 0.350–4.500)

## 2022-05-08 MED ORDER — ADULT MULTIVITAMIN W/MINERALS CH
1.0000 | ORAL_TABLET | Freq: Every day | ORAL | Status: DC
  Administered 2022-05-08 – 2022-05-10 (×3): 1 via ORAL
  Filled 2022-05-08 (×3): qty 1

## 2022-05-08 MED ORDER — APIXABAN 2.5 MG PO TABS: 2.5000 mg | ORAL_TABLET | Freq: Two times a day (BID) | ORAL | 0 refills | Status: DC

## 2022-05-08 MED ORDER — OXYCODONE HCL 5 MG PO TABS: 5.0000 mg | ORAL_TABLET | Freq: Four times a day (QID) | ORAL | 0 refills | Status: DC | PRN

## 2022-05-08 NOTE — TOC Progression Note (Signed)
Transition of Care [facility]) - Progression Note    Patient Details  Name: Latoya Bautista MRN: [id number] Date of Birth: 1971-04-21  Transition of [facility]) CM/SW Contact  Joanne Chars, LCSW Phone Number: 05/08/2022, 12:05 PM  Clinical Narrative:   Bed offers presented to pt, she would like to accept offer at [facility].  CSW spoke with Debbie/[place] and she will start insurance auth.      Expected Discharge Plan: [place] Barriers to Discharge: Continued Medical Work up, SNF Pending bed offer  Expected Discharge Plan and Services In-house Referral: Clinical Social Work   Post Acute Care Choice: [place] Living arrangements for the past 2 months: Single Family Home                                       Social Determinants of Health (SDOH) Interventions SDOH Screenings   Food Insecurity: No Food Insecurity (05/06/2022)  Recent Concern: [place] Present (03/15/2022)  Housing: Low Risk  (05/06/2022)  Transportation Needs: No Transportation Needs (05/06/2022)  Utilities: Not At Risk (05/06/2022)  Alcohol Screen: Low Risk  (03/15/2022)  Depression (PHQ2-9): Low Risk  (03/15/2022)  Financial Resource Strain: High Risk (03/15/2022)  Physical Activity: Sufficiently Active (03/15/2022)  Social Connections: Socially Integrated (03/15/2022)  Stress: No Stress Concern Present (03/15/2022)  Tobacco Use: Low Risk  (05/06/2022)    Readmission Risk Interventions     No data to display

## 2022-05-08 NOTE — Progress Notes (Signed)
Initial Nutrition Assessment  DOCUMENTATION CODES:   Not applicable  INTERVENTION:   Multivitamin w/ minerals daily Gatorade - BID (unit staff to order via bulk ordering system) Encourage good PO intake  NUTRITION DIAGNOSIS:   Increased nutrient needs related to post-op healing as evidenced by estimated needs.  GOAL:   Patient will meet greater than or equal to 90% of their needs  MONITOR:   PO intake, Labs, I & O's, Weight trends  REASON FOR ASSESSMENT:   Consult Assessment of nutrition requirement/status  ASSESSMENT:   52 y.o. female presented to the ED, after a fall and landing on her right side. PMH includes cirrhosis, tongue cancer, pulmonary fibrosis, seizures, HTN, and Hepatitis C. Pt admitted with R hip fracture.   12/31 - Admitted 01/01 - OR, nailing R femur fracture; diet advanced to regular  Pt sitting up in chair. Reports that appetite PTA was good, concerned with sodium intake due to chronic issue with hyponatremia. Reports that she has to drink at least 2 beverages per day with electrolytes to help keep sodium level WNL. When asked about weight loss, does not endorse or deny any. Shares that she has been on prednisone and has had fluid from that. Appears that pt current weight is stated versus actual weight.  RD encouraged pt to continue to eat well for proper healing post-op. Pt inquired about Gatorade to assist in maintaining sodium levels.   Meal Intakes  1/01-1/03: 75-100% x 3 meals  Medications reviewed and include: Colace, Pepcid, Folic acid, Magic Mouthwash w/ Lidocaine, Protonix, Vitamin D Labs reviewed: Sodium 136  NUTRITION - FOCUSED PHYSICAL EXAM:  Deferred due to pt getting vitals.   Diet Order:   Diet Order             Diet regular Room service appropriate? Yes; Fluid consistency: Thin  Diet effective now                  EDUCATION NEEDS:   No education needs have been identified at this time  Skin:  Skin Assessment: Reviewed  RN Assessment  Last BM:  12/31  Height:  Ht Readings from Last 1 Encounters:  05/05/22 '4\' 11"'$  (1.499 m)   Weight:  Wt Readings from Last 1 Encounters:  05/05/22 63.5 kg   BMI:  Body mass index is 28.28 kg/m.  Estimated Nutritional Needs:  Kcal:  2000-2200 Protein:  100-115 grams Fluid:  >/= 2 L   Hermina Barters RD, LDN Clinical Dietitian See [facility] for contact information.

## 2022-05-08 NOTE — Progress Notes (Signed)
Occupational Therapy Treatment Patient Details Name: KILIE RUND MRN: [id number] DOB: 11-24-1970 Today's Date: 05/08/2022   History of present illness Pt is 52 y/o female who presented on 05/05/22 after a fall resulting in R femur fx. Pt underwent IM nailing by Dr [name] on 05/06/22. PMH: leukemia, tongue cancer, liver cirrhosis, pulmonary fibrosis, hep C.   OT comments  Patient in bed upon therapy arrival and agreeable to participate in skilled OT session. Pt verbalized frustration with assistance during the night shift. Encouraged pt to request a new Nurse Tech if she feels she is not receiving an adequate amount of care. Pt was assisted to the bathroom using RW and therapist providing Min guard due to increased RLE stiffness and pain. Pt educated to utilized her BUE and bear weight into RW handles to off load RLE when stepping forward with her left leg. Pt unable to due to placement of IV on left wrist which impedes her ability to put enough weight into her arm. Pt able to complete functional mobility with RW using short quick steps with the left foot. Pt was educated on use of AE such as reacher and sock aid to assist with LB ADL tasks. Pt reports that she is familiar with a reacher and sock aid in the past. Pt able to demonstrate and verbalize understanding of technique to don and doff socks. All OT education has been completed this session. Pt does not require additional acute OT services at this time. Pt is still appropriate for SNF discharge prior to returning home.    Recommendations for follow up therapy are one component of a multi-disciplinary discharge planning process, led by the attending physician.  Recommendations may be updated based on patient status, additional functional criteria and insurance authorization.    Follow Up Recommendations  Skilled nursing-short term rehab (<3 hours/day)     Assistance Recommended at Discharge PRN  Patient can return home with the following  A  little help with walking and/or transfers;A little help with bathing/dressing/bathroom;Assistance with cooking/housework;Help with stairs or ramp for entrance;Assist for transportation   Equipment Recommendations  BSC/3in1       Precautions / Restrictions Precautions Precautions: Fall Restrictions Weight Bearing Restrictions: Yes RLE Weight Bearing: Weight bearing as tolerated       Mobility Bed Mobility Overal bed mobility: Needs Assistance Bed Mobility: Supine to Sit     Supine to sit: Supervision, HOB elevated       Patient Response: Cooperative  Transfers Overall transfer level: Needs assistance Equipment used: Rolling walker (2 wheels) Transfers: Sit to/from Stand, Bed to chair/wheelchair/BSC Sit to Stand: Min guard     Step pivot transfers: Min guard     General transfer comment: Pt provided with CGA during functional transfers and functional mobility within room due to reports of increased RLE stiffness and pain when attempting to weight bear.     Balance Overall balance assessment: Needs assistance Sitting-balance support: Feet unsupported, No upper extremity supported Sitting balance-Leahy Scale: Good     Standing balance support: Single extremity supported, During functional activity Standing balance-Leahy Scale: Poor       ADL either performed or assessed with clinical judgement   ADL Overall ADL's : Needs assistance/impaired     Grooming: Wash/dry hands;Supervision/safety;Standing               Lower Body Dressing: Min guard;With adaptive equipment Lower Body Dressing Details (indicate cue type and reason): sitting in recliner utilizing Reacher and sock aid to manage socks Toilet  Transfer: Min guard;Rolling walker (2 wheels);Regular Toilet;Grab bars;Ambulation   Toileting- Clothing Manipulation and Hygiene: Sit to/from stand;Supervision/safety                Cognition Arousal/Alertness: Awake/alert Behavior During Therapy: WFL  for tasks assessed/performed Overall Cognitive Status: Within Functional Limits for tasks assessed                General Comments Pt's call light was replaced during session as cord was damaged with exposed wires.    Pertinent Vitals/ Pain       Pain Assessment Pain Assessment: Faces Faces Pain Scale: Hurts little more Pain Location: R hip and left wrist at IV site when attempting to weight bear into arms while using RW. Pain Descriptors / Indicators: Sore, Operative site guarding, Grimacing Pain Intervention(s): Limited activity within patient's tolerance, Monitored during session, Patient requesting pain meds-RN notified, Repositioned      Progress Toward Goals  OT Goals(current goals can now be found in the care plan section)  Progress towards OT goals: Goals met/education completed, patient discharged from [place] Discharge plan remains appropriate;Frequency remains appropriate       AM-PAC OT "6 Clicks" Daily Activity     Outcome Measure   Help from another person eating meals?: None Help from another person taking care of personal grooming?: None Help from another person toileting, which includes using toliet, bedpan, or urinal?: A Little Help from another person bathing (including washing, rinsing, drying)?: A Little Help from another person to put on and taking off regular upper body clothing?: None Help from another person to put on and taking off regular lower body clothing?: A Little 6 Click Score: 21    End of Session Equipment Utilized During Treatment: Rolling walker (2 wheels)  OT Visit Diagnosis: History of falling (Z91.81);Muscle weakness (generalized) (M62.81)   Activity Tolerance Patient tolerated treatment well   Patient Left in chair;with call bell/phone within reach;with nursing/sitter in room   Nurse Communication Patient requests pain meds        Time: 1130-1150 OT Time Calculation (min): 20 min  Charges: OT General Charges $OT  Visit: 1 Visit OT Treatments $Self Care/Home Management : 8-22 mins  Ailene Ravel, OTR/L,CBIS  Supplemental OT - [facility] and [facility] Secure Chat Preferred    Payton Moder, Clarene Duke 05/08/2022, 12:24 PM

## 2022-05-08 NOTE — Progress Notes (Signed)
PROGRESS NOTE  Latoya Bautista:[id number] DOB: 10-10-1970   PCP: [name], MD  Patient is from: Home.  DOA: 05/05/2022 LOS: 3  Chief complaints Chief Complaint  Patient presents with   Fall    Level 2     Brief Narrative / Interim history: 52 year old F with PMH of leukemia, tongue cancer s/p resection, hepatitis C, liver cirrhosis, pulmonary fibrosis, seizure and chronic hyponatremia admitted with right hip fracture after fall at home.  She also had AKI and hyponatremia.  Patient underwent intramedullary nailing on 05/06/2021.  AKI improved.  Hyponatremia resolved.  Therapy recommended SNF.  Medically optimized for discharge.  Subjective: Seen and examined earlier this morning.  No major events overnight of this morning.  Was tachycardic to 120s earlier in the morning.  She did not receive her nightly dose of metoprolol due to soft blood pressure last night.  Heart rate improved to 110s.  Objective: Vitals:   05/07/22 2225 05/08/22 0359 05/08/22 0800 05/08/22 1043  BP: (!) 90/55 (!) 119/52 120/64 (!) 114/53  Pulse: 100 (!) 106 (!) 128 (!) 108  Resp: '17 19 18 16  '$ Temp: 97.9 F (36.6 C) 97.9 F (36.6 C) 98.1 F (36.7 C) 98.2 F (36.8 C)  TempSrc: Oral Oral Oral Oral  SpO2: 100% 98% 99% 100%  Weight:      Height:        Examination:  GENERAL: No apparent distress.  Nontoxic. HEENT: MMM.  Vision and hearing grossly intact.  NECK: Supple.  No apparent JVD.  RESP:  No IWOB.  Fair aeration bilaterally. CVS: Tachycardic to 110s.  Regular rhythm.  Heart sounds normal.  ABD/GI/GU: BS+. Abd soft, NTND.  MSK/EXT:  Moves extremities. No apparent deformity. No edema.  SKIN: Dressing over right thigh DCI. NEURO: Awake and alert. Oriented appropriately.  No apparent focal neuro deficit. PSYCH: Calm. Normal affect.   Procedures:  05/06/2022-intramedullary nailing of right femoral fracture  Microbiology summarized: MRSA PCR screen negative.  Assessment and  plan: Principal Problem:   Pathological fracture of right femur due to osteoporosis [facility]) Active Problems:   History of hepatitis C   Liver cirrhosis (HCC)   Thrombocytopenia (HCC)   Localization-related epilepsy (HCC)   Melanoma in situ of skin of buttock (HCC)   History of thrombocytopenia   ILD (interstitial lung disease) ([place])   Bone marrow transplant status ([place])   Femur fracture, right (HCC)   AKI (acute kidney injury) ([place])   Chronic hyponatremia  Accidental fall at home Osteoporotic pathological fracture of right femur  -S/p intramedullary nailing by Dr. [name] on 05/06/2022. -Pain control and DVT prophylaxis per surgery -Bowel regimen as needed for constipation -On Forteo and vitamin D for osteoporosis. -PT/OT-recommended SNF.   Acute kidney injury: B/l Cr 0.7-0.8.  Improving. Recent Labs    11/07/21 1534 05/05/22 1858 05/06/22 0145 05/07/22 0417 05/08/22 0300  BUN '11 12 12 17 '$ 26*  CREATININE 0.78 1.31* 1.21* 1.49* 1.37*  -Encourage oral hydration -Recheck in the morning. -Benicar discontinued.  Hypertension/sinus tachycardia: HR elevated to 120s earlier this morning.  Metoprolol was held last night due to soft blood pressure -Continue metoprolol. -Continue holding Benicar -Check TSH  Hyponatremia/mild hyperkalemia: Resolved. -Recheck in the morning.  Hypothyroidism: -Check TSH -Continue home Synthroid   Dyslipidemia: -Continue home Tricor and Crestor   Seizure: -Continue Lamictal    GERD: -Continue Protonix.   Osteoporosis: -Forteo and vitamin D    ILD (interstitial lung disease) ([place]): -Outpatient follow-up with pulmonology   Hepatitis  C/liver cirrhosis: -Treated for hepatitis C.   Melanoma in situ of skin of buttock (HCC)   Body mass index is 28.28 kg/m.          DVT prophylaxis:  SCDs Start: 05/06/22 1047 enoxaparin (LOVENOX) injection 40 mg Start: 05/05/22 2045 SCDs Start: 05/05/22 2038  Code Status: Full code Family  Communication: None at bedside Level of care: Med-Surg Status is: Inpatient Remains inpatient appropriate because: SNF bed.   Final disposition: SNF Consultants:  Orthopedic surgery  Sch Meds:  Scheduled Meds:  docusate sodium  100 mg Oral BID   enoxaparin (LOVENOX) injection  40 mg Subcutaneous Q24H   estradiol  1 mg Oral Daily   famotidine  20 mg Oral Daily   fenofibrate  160 mg Oral Daily   folic acid  1 mg Oral Daily   lamoTRIgine  150 mg Oral BID   levothyroxine  75 mcg Oral Daily   magic mouthwash w/lidocaine  5 mL Oral QID   metoprolol tartrate  50 mg Oral BID   neomycin-bacitracin-polymyxin   Topical Daily   pantoprazole  40 mg Oral Daily   progesterone  100 mg Oral Daily   rosuvastatin  40 mg Oral Daily   Teriparatide (Recombinant)  20 mcg Subcutaneous Daily   Vitamin D (Ergocalciferol)  50,000 Units Oral Q14 Days   Continuous Infusions:  methocarbamol (ROBAXIN) IV     PRN Meds:.acetaminophen, methocarbamol **OR** methocarbamol (ROBAXIN) IV, metoCLOPramide **OR** metoCLOPramide (REGLAN) injection, morphine injection, ondansetron **OR** ondansetron (ZOFRAN) IV, oxyCODONE, polyethylene glycol, senna-docusate  Antimicrobials: Anti-infectives (From admission, onward)    Start     Dose/Rate Route Frequency Ordered Stop   05/06/22 1600  ceFAZolin (ANCEF) IVPB 2g/100 mL premix        2 g 200 mL/hr over 30 Minutes Intravenous Every 8 hours 05/06/22 1046 05/07/22 0558   05/06/22 0800  ceFAZolin (ANCEF) IVPB 2g/100 mL premix        2 g 200 mL/hr over 30 Minutes Intravenous To Short Stay 05/06/22 0706 05/06/22 0828   05/06/22 0752  ceFAZolin (ANCEF) 2-4 GM/100ML-% IVPB       Note to Pharmacy: Anastasio Auerbach: cabinet override      05/06/22 0752 05/06/22 0825        I have personally reviewed the following labs and images: CBC: Recent Labs  Lab 05/05/22 1858 05/06/22 0145 05/07/22 0417 05/08/22 0300  WBC 8.5 10.6* 14.9* 13.3*  NEUTROABS 5.1  --   --   --    HGB 12.9 11.4* 10.0* 9.1*  HCT 38.6 32.7* 29.9* 27.6*  MCV 99.2 97.6 100.7* 100.7*  PLT 210 187 179 166   BMP &GFR Recent Labs  Lab 05/05/22 1858 05/06/22 0145 05/07/22 0417 05/08/22 0300  NA 131* 135 131* 136  K 4.5 4.8 5.2* 4.5  CL 99 101 101 102  CO2 23 23 21* 23  GLUCOSE 120* 110* 124* 96  BUN '12 12 17 '$ 26*  CREATININE 1.31* 1.21* 1.49* 1.37*  CALCIUM 9.8 9.6 9.0 9.0  MG  --   --  1.9  --   PHOS  --   --  3.3 2.6   Estimated Creatinine Clearance: 39.3 mL/min (A) (by C-G formula based on SCr of 1.37 mg/dL (H)). Liver & Pancreas: Recent Labs  Lab 05/05/22 1858 05/07/22 0417 05/08/22 0300  AST 40  --   --   ALT 26  --   --   ALKPHOS 79  --   --   BILITOT 0.4  --   --  PROT 7.3  --   --   ALBUMIN 4.2 3.5 3.3*   No results for input(s): "LIPASE", "AMYLASE" in the last 168 hours. No results for input(s): "AMMONIA" in the last 168 hours. Diabetic: No results for input(s): "HGBA1C" in the last 72 hours. No results for input(s): "GLUCAP" in the last 168 hours. Cardiac Enzymes: No results for input(s): "CKTOTAL", "CKMB", "CKMBINDEX", "TROPONINI" in the last 168 hours. No results for input(s): "PROBNP" in the last 8760 hours. Coagulation Profile: No results for input(s): "INR", "PROTIME" in the last 168 hours. Thyroid Function Tests: No results for input(s): "TSH", "T4TOTAL", "FREET4", "T3FREE", "THYROIDAB" in the last 72 hours. Lipid Profile: No results for input(s): "CHOL", "HDL", "LDLCALC", "TRIG", "CHOLHDL", "LDLDIRECT" in the last 72 hours. Anemia Panel: No results for input(s): "VITAMINB12", "FOLATE", "FERRITIN", "TIBC", "IRON", "RETICCTPCT" in the last 72 hours. Urine analysis:    Component Value Date/Time   COLORURINE YELLOW 07/28/2014 0600   APPEARANCEUR CLEAR 07/28/2014 0600   LABSPEC >1.030 (H) 07/28/2014 0600   PHURINE 5.5 07/28/2014 0600   GLUCOSEU NEGATIVE 07/28/2014 0600   HGBUR NEGATIVE 07/28/2014 0600   BILIRUBINUR NEGATIVE 07/28/2014 0600    KETONESUR NEGATIVE 07/28/2014 0600   PROTEINUR 100 (A) 07/28/2014 0600   UROBILINOGEN 0.2 07/28/2014 0600   NITRITE NEGATIVE 07/28/2014 0600   LEUKOCYTESUR NEGATIVE 07/28/2014 0600   Sepsis Labs: Invalid input(s): "PROCALCITONIN", "LACTICIDVEN"  Microbiology: Recent Results (from the past 240 hour(s))  Surgical pcr screen     Status: None   Collection Time: 05/06/22  4:09 AM   Specimen: Nasal Mucosa; Nasal Swab  Result Value Ref Range Status   MRSA, PCR NEGATIVE NEGATIVE Final   Staphylococcus aureus NEGATIVE NEGATIVE Final    Comment: (NOTE) The Xpert SA Assay (FDA approved for NASAL specimens in patients 26 years of age and older), is one component of a comprehensive surveillance program. It is not intended to diagnose infection nor to guide or monitor treatment. Performed at [facility], [place] [address] [place], [place] [address]     Radiology Studies: No results found.    Syona Wroblewski T. [place]  If 7PM-7AM, please contact night-coverage [email or web address] 05/08/2022, 1:43 PM

## 2022-05-08 NOTE — Progress Notes (Addendum)
Patient has elevated HR.  BP soft. Yellow MEWS.  Consulted DR.  Consulted to give metoprolol as ordered.   Rechecked Vitals later.  HR came down and now green MEWS

## 2022-05-08 NOTE — Progress Notes (Signed)
Orthopaedic Trauma Progress Note  SUBJECTIVE: Doing well. Pain controlled. Making improvements with therapies. Hyponatremia and hyperkalemia resolved.  No chest pain. No SOB. No nausea/vomiting. Denies numbness or tingling throughout extremity. BP has been on the lower side, one of hypertension meds has been held.   OBJECTIVE:  Vitals:   05/07/22 2225 05/08/22 0359  BP: (!) 90/55 (!) 119/52  Pulse: 100 (!) 106  Resp: 17 19  Temp: 97.9 F (36.6 C) 97.9 F (36.6 C)  SpO2: 100% 98%    General: Sitting up in bed, NAD.  Respiratory: No increased work of breathing.  RLE: Dressing changed, incisions CDI. Mild soreness through the thigh as expected. Swelling stable. Ankle DF/PF intact. Endorses sensation throughout extremity. Foot warm and well perfused. Neurovascularly intact  IMAGING: Stable post op imaging.   LABS:  Results for orders placed or performed during the hospital encounter of 05/05/22 (from the past 24 hour(s))  CBC     Status: Abnormal   Collection Time: 05/08/22  3:00 AM  Result Value Ref Range   WBC 13.3 (H) 4.0 - 10.5 K/uL   RBC 2.74 (L) 3.87 - 5.11 MIL/uL   Hemoglobin 9.1 (L) 12.0 - 15.0 g/dL   HCT 27.6 (L) 36.0 - 46.0 %   MCV 100.7 (H) 80.0 - 100.0 fL   MCH 33.2 26.0 - 34.0 pg   MCHC 33.0 30.0 - 36.0 g/dL   RDW 12.9 11.5 - 15.5 %   Platelets 166 150 - 400 K/uL   nRBC 0.0 0.0 - 0.2 %  Renal function panel     Status: Abnormal   Collection Time: 05/08/22  3:00 AM  Result Value Ref Range   Sodium 136 135 - 145 mmol/L   Potassium 4.5 3.5 - 5.1 mmol/L   Chloride 102 98 - 111 mmol/L   CO2 23 22 - 32 mmol/L   Glucose, Bld 96 70 - 99 mg/dL   BUN 26 (H) 6 - 20 mg/dL   Creatinine, Ser 1.37 (H) 0.44 - 1.00 mg/dL   Calcium 9.0 8.9 - 10.3 mg/dL   Phosphorus 2.6 2.5 - 4.6 mg/dL   Albumin 3.3 (L) 3.5 - 5.0 g/dL   GFR, Estimated 47 (L) >60 mL/min   Anion gap 11 5 - 15    ASSESSMENT: Latoya Bautista is a 52 y.o. female, 2 Days Post-Op s/p INTRAMEDULLARY NAIL RIGHT  FEMUR  CV/Blood loss: Acute blood loss anemia, Hgb 9.1 this AM. Hemodynamically stable  PLAN: Weightbearing: WBAT RLE ROM:  Ok for hip and knee ROM as tolerated  Incisional and dressing care: Changed today, continue to change PRN. Ok to leave open to air Showering: OK to begin showering and getting incisions wet 05/09/21 Orthopedic device(s): None  Pain management:  1. Tylenol 1000 mg q 6 hours PRN 2. Robaxin 500 mg q 6 hours PRN 3. Oxycodone 5-10 mg q 4 hours PRN 4. Dilaudid 1-2 mg q 2 hours PRN VTE prophylaxis: Lovenox, SCDs ID:  Ancef 2gm post op completed Foley/Lines:  No foley, KVO IVFs Impediments to Fracture Healing: Vit D level 52, continue home dose supplementation Dispo: PT/OT eval ongoing, currently recommending SNF. Ok for d/c from ortho standpoint once cleared by medicine team and therapies  D/C recommendations:  - Oxycodone 5 mg for pain control - signed and placed in chart - Eliquis 2.5 mg BID x 30 days for DVT prophylaxis - signed and placed in chart - Continue home dose Vit D supplementation  Follow - up plan: 2 weeks  after d/c for wound check and repeat x-rays   Contact information:  Katha Hamming MD, Rushie Nyhan PA-C. After hours and holidays please check [email or web address] for group call information for [facility]   Gwinda Passe, PA-C [phone] (office) [email or web address]

## 2022-05-09 ENCOUNTER — Encounter (HOSPITAL_COMMUNITY): Payer: Self-pay | Admitting: Student

## 2022-05-09 DIAGNOSIS — R7989 Other specified abnormal findings of blood chemistry: Secondary | ICD-10-CM

## 2022-05-09 DIAGNOSIS — G40109 Localization-related (focal) (partial) symptomatic epilepsy and epileptic syndromes with simple partial seizures, not intractable, without status epilepticus: Secondary | ICD-10-CM | POA: Diagnosis not present

## 2022-05-09 DIAGNOSIS — S7291XA Unspecified fracture of right femur, initial encounter for closed fracture: Secondary | ICD-10-CM | POA: Diagnosis not present

## 2022-05-09 DIAGNOSIS — Z862 Personal history of diseases of the blood and blood-forming organs and certain disorders involving the immune mechanism: Secondary | ICD-10-CM | POA: Diagnosis not present

## 2022-05-09 DIAGNOSIS — D696 Thrombocytopenia, unspecified: Secondary | ICD-10-CM | POA: Diagnosis not present

## 2022-05-09 LAB — RETICULOCYTES
Immature Retic Fract: 28.8 % — ABNORMAL HIGH (ref 2.3–15.9)
RBC.: 2.84 MIL/uL — ABNORMAL LOW (ref 3.87–5.11)
Retic Count, Absolute: 60.2 10*3/uL (ref 19.0–186.0)
Retic Ct Pct: 2.1 % (ref 0.4–3.1)

## 2022-05-09 LAB — RENAL FUNCTION PANEL
Albumin: 3.3 g/dL — ABNORMAL LOW (ref 3.5–5.0)
Anion gap: 7 (ref 5–15)
BUN: 22 mg/dL — ABNORMAL HIGH (ref 6–20)
CO2: 22 mmol/L (ref 22–32)
Calcium: 9.2 mg/dL (ref 8.9–10.3)
Chloride: 100 mmol/L (ref 98–111)
Creatinine, Ser: 1.16 mg/dL — ABNORMAL HIGH (ref 0.44–1.00)
GFR, Estimated: 57 mL/min — ABNORMAL LOW (ref >60)
Glucose, Bld: 110 mg/dL — ABNORMAL HIGH (ref 70–99)
Phosphorus: 2.8 mg/dL (ref 2.5–4.6)
Potassium: 4.4 mmol/L (ref 3.5–5.1)
Sodium: 129 mmol/L — ABNORMAL LOW (ref 135–145)

## 2022-05-09 LAB — VITAMIN B12: Vitamin B-12: 1435 pg/mL — ABNORMAL HIGH (ref 180–914)

## 2022-05-09 LAB — CBC
HCT: 27.3 % — ABNORMAL LOW (ref 36.0–46.0)
Hemoglobin: 9.4 g/dL — ABNORMAL LOW (ref 12.0–15.0)
MCH: 34.1 pg — ABNORMAL HIGH (ref 26.0–34.0)
MCHC: 34.4 g/dL (ref 30.0–36.0)
MCV: 98.9 fL (ref 80.0–100.0)
Platelets: 188 10*3/uL (ref 150–400)
RBC: 2.76 MIL/uL — ABNORMAL LOW (ref 3.87–5.11)
RDW: 12.9 % (ref 11.5–15.5)
WBC: 11.7 10*3/uL — ABNORMAL HIGH (ref 4.0–10.5)
nRBC: 0 % (ref 0.0–0.2)

## 2022-05-09 LAB — FOLATE: Folate: >40 ng/mL (ref >5.9)

## 2022-05-09 LAB — IRON AND TIBC
Iron: 70 ug/dL (ref 28–170)
Saturation Ratios: 19 % (ref 10.4–31.8)
TIBC: 374 ug/dL (ref 250–450)
UIBC: 304 ug/dL

## 2022-05-09 LAB — CORTISOL: Cortisol, Plasma: 14.4 ug/dL

## 2022-05-09 LAB — FERRITIN: Ferritin: 180 ng/mL (ref 11–307)

## 2022-05-09 LAB — MAGNESIUM: Magnesium: 1.9 mg/dL (ref 1.7–2.4)

## 2022-05-09 MED ORDER — LEVOTHYROXINE SODIUM 50 MCG PO TABS
62.5000 ug | ORAL_TABLET | Freq: Every day | ORAL | Status: DC
  Administered 2022-05-10: 62.5 ug via ORAL
  Filled 2022-05-09: qty 1

## 2022-05-09 NOTE — TOC Progression Note (Signed)
Transition of Care [facility]) - Progression Note    Patient Details  Name: Latoya Bautista MRN: [id number] Date of Birth: 03-22-1971  Transition of Care [facility]) CM/SW Contact  Joanne Chars, LCSW Phone Number: 05/09/2022, 2:38 PM  Clinical Narrative:   CSW spoke with Debbie/[place].  Auth still pending but she expects it by tomorrow.     Expected Discharge Plan: [place] Barriers to Discharge: Continued Medical Work up, SNF Pending bed offer  Expected Discharge Plan and Services In-house Referral: Clinical Social Work   Post Acute Care Choice: [place] Living arrangements for the past 2 months: Single Family Home Expected Discharge Date: 05/09/22                                     Social Determinants of Health (SDOH) Interventions SDOH Screenings   Food Insecurity: No Food Insecurity (05/06/2022)  Recent Concern: [place] Present (03/15/2022)  Housing: Low Risk  (05/06/2022)  Transportation Needs: No Transportation Needs (05/06/2022)  Utilities: Not At Risk (05/06/2022)  Alcohol Screen: Low Risk  (03/15/2022)  Depression (PHQ2-9): Low Risk  (03/15/2022)  Financial Resource Strain: High Risk (03/15/2022)  Physical Activity: Sufficiently Active (03/15/2022)  Social Connections: Socially Integrated (03/15/2022)  Stress: No Stress Concern Present (03/15/2022)  Tobacco Use: Low Risk  (05/06/2022)    Readmission Risk Interventions     No data to display

## 2022-05-09 NOTE — Progress Notes (Signed)
PROGRESS NOTE  Latoya Bautista MWU:[id number] DOB: [birth year]   PCP: [name], MD  Patient is from: Home.  DOA: 05/05/2022 LOS: 4  Chief complaints Chief Complaint  Patient presents with   Fall    Level 2     Brief Narrative / Interim history: 52 year old F with PMH of leukemia, tongue cancer s/p resection, hepatitis C, liver cirrhosis, pulmonary fibrosis, seizure and chronic hyponatremia admitted with right hip fracture after fall at home.  She also had AKI and hyponatremia.  Patient underwent intramedullary nailing on 05/06/2021.  AKI improved.  Hyponatremia stable.    Patient has mild tachycardia with intermittent soft blood pressures.  TSH low at 0.112.  Decrease Synthroid from 75 to 62.5 mcg daily.    Therapy recommended SNF. Medically optimized for discharge pending insurance authorization.  Subjective: Seen and examined earlier this morning.  No major events overnight of this morning.  No complaints.  She denies chest pain, dyspnea, palpitation or dizziness.  Objective: Vitals:   05/08/22 2154 05/09/22 0419 05/09/22 0744 05/09/22 1428  BP: 109/61 (!) 97/58 (!) 103/57 (!) 94/59  Pulse: (!) 109  (!) 108 100  Resp: '18 18 19 17  '$ Temp: 98.4 F (36.9 C) 98.4 F (36.9 C) 98.2 F (36.8 C) 98.1 F (36.7 C)  TempSrc: Oral Oral Oral Oral  SpO2: 99%  100% 94%  Weight:      Height:        Examination:   GENERAL: No apparent distress.  Nontoxic. HEENT: MMM.  Vision and hearing grossly intact.  NECK: Supple.  No apparent JVD.  RESP:  No IWOB.  Fair aeration bilaterally. CVS: Tachycardic to 100.  Heart sounds normal.  ABD/GI/GU: BS+. Abd soft, NTND.  MSK/EXT:  Moves extremities. No apparent deformity. No edema.  SKIN: Dressing over right hip DCI. NEURO: Awake and alert. Oriented appropriately.  No apparent focal neuro deficit. PSYCH: Calm. Normal affect.   Procedures:  05/06/2022-intramedullary nailing of right femoral fracture  Microbiology summarized: MRSA PCR  screen negative.  Assessment and plan: Principal Problem:   Pathological fracture of right femur due to osteoporosis [facility]) Active Problems:   History of hepatitis C   Liver cirrhosis (HCC)   Thrombocytopenia (HCC)   Localization-related epilepsy (HCC)   Melanoma in situ of skin of buttock (HCC)   History of thrombocytopenia   ILD (interstitial lung disease) ([place])   Bone marrow transplant status ([place])   Femur fracture, right (HCC)   AKI (acute kidney injury) ([place])   Chronic hyponatremia  Accidental fall at home Osteoporotic pathological fracture of right femur  -S/p intramedullary nailing by Dr. [name] on 05/06/2022. -Pain control and DVT prophylaxis per surgery -Bowel regimen as needed for constipation -On Forteo and vitamin D for osteoporosis. -PT/OT-recommended SNF.   Acute kidney injury: B/l Cr 0.7-0.8.  Improving. Recent Labs    11/07/21 1534 05/05/22 1858 05/06/22 0145 05/07/22 0417 05/08/22 0300 05/09/22 0405  BUN '11 12 12 17 '$ 26* 22*  CREATININE 0.78 1.31* 1.21* 1.49* 1.37* 1.16*  -Encourage oral hydration -Recheck in the morning. -Benicar discontinued.  Hypotension/sinus tachycardia: Slightly tachycardic to 100s.  Has soft blood pressures.  Also with chronic hyponatremia.  TSH low at 0.112.  Denies recent changes to his Synthroid dose.  TTE in 12/2021 with normal LVEF and indeterminate DD. -Continue metoprolol 50 mg twice daily -Continue holding Benicar -Decrease Synthroid from 75 mcg to 62.5 mcg.  Repeat TSH in 4 to 6 weeks -Check cortisol level.  If low, will recheck  in the morning with ACTH stimulation test -Orthostatic vitals  Hyponatremia/mild hyperkalemia: Resolved. -Recheck in the morning.  Hypothyroidism: TSH low at 0.112 -Decrease Synthroid from 75 mcg to 62.5 mcg daily   Dyslipidemia: -Continue home Tricor and Crestor   Seizure: -Continue Lamictal    GERD: -Continue Protonix.   Osteoporosis: -Forteo and vitamin D    ILD (interstitial  lung disease) ([place]): -Outpatient follow-up with pulmonology   Hepatitis C/liver cirrhosis: -Treated for hepatitis C.   Melanoma in situ of skin of buttock (HCC)  Increased nutrient needs Body mass index is 28.28 kg/m. Nutrition Problem: Increased nutrient needs Etiology: post-op healing Signs/Symptoms: estimated needs Interventions: MVI, Refer to RD note for recommendations   DVT prophylaxis:  SCDs Start: 05/06/22 1047 enoxaparin (LOVENOX) injection 40 mg Start: 05/05/22 2045 SCDs Start: 05/05/22 2038  Code Status: Full code Family Communication: None at bedside Level of care: Med-Surg Status is: Inpatient Remains inpatient appropriate because: SNF bed.   Final disposition: SNF Consultants:  Orthopedic surgery  Sch Meds:  Scheduled Meds:  docusate sodium  100 mg Oral BID   enoxaparin (LOVENOX) injection  40 mg Subcutaneous Q24H   estradiol  1 mg Oral Daily   famotidine  20 mg Oral Daily   fenofibrate  160 mg Oral Daily   folic acid  1 mg Oral Daily   lamoTRIgine  150 mg Oral BID   [START ON 05/10/2022] levothyroxine  62.5 mcg Oral Daily   magic mouthwash w/lidocaine  5 mL Oral QID   metoprolol tartrate  50 mg Oral BID   multivitamin with minerals  1 tablet Oral Daily   neomycin-bacitracin-polymyxin   Topical Daily   pantoprazole  40 mg Oral Daily   progesterone  100 mg Oral Daily   rosuvastatin  40 mg Oral Daily   Teriparatide (Recombinant)  20 mcg Subcutaneous Daily   Vitamin D (Ergocalciferol)  50,000 Units Oral Q14 Days   Continuous Infusions:  methocarbamol (ROBAXIN) IV     PRN Meds:.acetaminophen, methocarbamol **OR** methocarbamol (ROBAXIN) IV, metoCLOPramide **OR** metoCLOPramide (REGLAN) injection, morphine injection, ondansetron **OR** ondansetron (ZOFRAN) IV, oxyCODONE, polyethylene glycol, senna-docusate  Antimicrobials: Anti-infectives (From admission, onward)    Start     Dose/Rate Route Frequency Ordered Stop   05/06/22 1600  ceFAZolin (ANCEF)  IVPB 2g/100 mL premix        2 g 200 mL/hr over 30 Minutes Intravenous Every 8 hours 05/06/22 1046 05/07/22 0558   05/06/22 0800  ceFAZolin (ANCEF) IVPB 2g/100 mL premix        2 g 200 mL/hr over 30 Minutes Intravenous To Short Stay 05/06/22 0706 05/06/22 0828   05/06/22 0752  ceFAZolin (ANCEF) 2-4 GM/100ML-% IVPB       Note to Pharmacy: Anastasio Auerbach: cabinet override      05/06/22 0752 05/06/22 0825        I have personally reviewed the following labs and images: CBC: Recent Labs  Lab 05/05/22 1858 05/06/22 0145 05/07/22 0417 05/08/22 0300 05/09/22 0405  WBC 8.5 10.6* 14.9* 13.3* 11.7*  NEUTROABS 5.1  --   --   --   --   HGB 12.9 11.4* 10.0* 9.1* 9.4*  HCT 38.6 32.7* 29.9* 27.6* 27.3*  MCV 99.2 97.6 100.7* 100.7* 98.9  PLT 210 187 179 166 188   BMP &GFR Recent Labs  Lab 05/05/22 1858 05/06/22 0145 05/07/22 0417 05/08/22 0300 05/09/22 0405  NA 131* 135 131* 136 129*  K 4.5 4.8 5.2* 4.5 4.4  CL 99 101 101 102  100  CO2 23 23 21* 23 22  GLUCOSE 120* 110* 124* 96 110*  BUN '12 12 17 '$ 26* 22*  CREATININE 1.31* 1.21* 1.49* 1.37* 1.16*  CALCIUM 9.8 9.6 9.0 9.0 9.2  MG  --   --  1.9  --  1.9  PHOS  --   --  3.3 2.6 2.8   Estimated Creatinine Clearance: 46.5 mL/min (A) (by C-G formula based on SCr of 1.16 mg/dL (H)). Liver & Pancreas: Recent Labs  Lab 05/05/22 1858 05/07/22 0417 05/08/22 0300 05/09/22 0405  AST 40  --   --   --   ALT 26  --   --   --   ALKPHOS 79  --   --   --   BILITOT 0.4  --   --   --   PROT 7.3  --   --   --   ALBUMIN 4.2 3.5 3.3* 3.3*   No results for input(s): "LIPASE", "AMYLASE" in the last 168 hours. No results for input(s): "AMMONIA" in the last 168 hours. Diabetic: No results for input(s): "HGBA1C" in the last 72 hours. No results for input(s): "GLUCAP" in the last 168 hours. Cardiac Enzymes: No results for input(s): "CKTOTAL", "CKMB", "CKMBINDEX", "TROPONINI" in the last 168 hours. No results for input(s): "PROBNP" in the  last 8760 hours. Coagulation Profile: No results for input(s): "INR", "PROTIME" in the last 168 hours. Thyroid Function Tests: Recent Labs    05/08/22 0300  TSH 0.112*   Lipid Profile: No results for input(s): "CHOL", "HDL", "LDLCALC", "TRIG", "CHOLHDL", "LDLDIRECT" in the last 72 hours. Anemia Panel: Recent Labs    05/09/22 0405  VITAMINB12 1,435*  FOLATE >40.0  FERRITIN 180  TIBC 374  IRON 70  RETICCTPCT 2.1   Urine analysis:    Component Value Date/Time   COLORURINE YELLOW 07/28/2014 0600   APPEARANCEUR CLEAR 07/28/2014 0600   LABSPEC >1.030 (H) 07/28/2014 0600   PHURINE 5.5 07/28/2014 0600   GLUCOSEU NEGATIVE 07/28/2014 0600   HGBUR NEGATIVE 07/28/2014 0600   BILIRUBINUR NEGATIVE 07/28/2014 0600   KETONESUR NEGATIVE 07/28/2014 0600   PROTEINUR 100 (A) 07/28/2014 0600   UROBILINOGEN 0.2 07/28/2014 0600   NITRITE NEGATIVE 07/28/2014 0600   LEUKOCYTESUR NEGATIVE 07/28/2014 0600   Sepsis Labs: Invalid input(s): "PROCALCITONIN", "LACTICIDVEN"  Microbiology: Recent Results (from the past 240 hour(s))  Surgical pcr screen     Status: None   Collection Time: 05/06/22  4:09 AM   Specimen: Nasal Mucosa; Nasal Swab  Result Value Ref Range Status   MRSA, PCR NEGATIVE NEGATIVE Final   Staphylococcus aureus NEGATIVE NEGATIVE Final    Comment: (NOTE) The Xpert SA Assay (FDA approved for NASAL specimens in patients 76 years of age and older), is one component of a comprehensive surveillance program. It is not intended to diagnose infection nor to guide or monitor treatment. Performed at [facility], [place] [address] [place], [place] [address]     Radiology Studies: No results found.    Khali Perella T. [place]  If 7PM-7AM, please contact night-coverage [email or web address] 05/09/2022, 2:43 PM

## 2022-05-09 NOTE — Plan of Care (Signed)

## 2022-05-09 NOTE — TOC CAGE-AID Note (Signed)
Transition of Care [facility]) - CAGE-AID Screening   Patient Details  Name: Latoya Bautista MRN: [id number] Date of Birth: 1970/08/15  Transition of Care (TOC) CM/SW Contact:    Army Melia, RN Phone [phone] 05/09/2022, 10:51 PM  CAGE-AID Screening:    Have You Ever Felt You Ought to Cut Down on Your Drinking or Drug Use?: No Have People Annoyed You By Critizing Your Drinking Or Drug Use?: No Have You Felt Bad Or Guilty About Your Drinking Or Drug Use?: No Have You Ever Had a Drink or Used Drugs First Thing In The Morning to Steady Your Nerves or to Get Rid of a Hangover?: No CAGE-AID Score: 0  Substance Abuse Education Offered: No

## 2022-05-09 NOTE — Progress Notes (Signed)
Physical Therapy Treatment Patient Details Name: Latoya Bautista MRN: [id number] DOB: 15-Nov-1970 Today's Date: 05/09/2022   History of Present Illness Pt is 52 y/o female who presented on 05/05/22 after a fall resulting in R femur fx. Pt underwent IM nailing by Dr [name] on 05/06/22. PMH: leukemia, tongue cancer, liver cirrhosis, pulmonary fibrosis, hep C.    PT Comments    Pt received seated EOB with NT present having just returned from bathroom, pt agreeable to therapy session and with good participation in seated/standing BLE exercises and gait training in the room. Pt with additional report of bowel urgency at end of session so remained on toilet with call bell in reach and RN entering room to check on her for hygiene assist. Pt needing up to min guard for transfers and mod cues for improved UE use and step sequencing with RW during gait training as pt with small shuffled steps, indicating high fall risk for community ambulation tasks. Attempted to have pt march LLE up standing at RW to simulate stair ascent but she was unable due to RLE pain and BUE fatigue, currently anticipate she would be max to [place] for curb/stair negotiation. Pt quick to fatigue and limited due to pain, remains well below functional baseline and would benefit from short term low intensity post-acute rehab to return to independent living. Pt would benefit from working with mobility specialist daily as well to continue progressing standing/gait endurance, MS notified. Pt continues to benefit from PT services to progress toward functional mobility goals.    Recommendations for follow up therapy are one component of a multi-disciplinary discharge planning process, led by the attending physician.  Recommendations may be updated based on patient status, additional functional criteria and insurance authorization.  Follow Up Recommendations  Skilled nursing-short term rehab (<3 hours/day) Can patient physically be transported by  private vehicle: Yes   Assistance Recommended at Discharge Frequent or constant Supervision/Assistance  Patient can return home with the following A lot of help with walking and/or transfers;A lot of help with bathing/dressing/bathroom;Assistance with cooking/housework;Assist for transportation;Help with stairs or ramp for entrance   Equipment Recommendations  Rolling walker (2 wheels)    Recommendations for Other Services       Precautions / Restrictions Precautions Precautions: Fall Restrictions Weight Bearing Restrictions: Yes RLE Weight Bearing: Weight bearing as tolerated     Mobility  Bed Mobility Overal bed mobility: Needs Assistance             General bed mobility comments: pt requesting to get to bed but once EOB reports need for more toileting, pt up in bathroom with RN notified to assist when she pulls call bell.    Transfers Overall transfer level: Needs assistance Equipment used: Rolling walker (2 wheels) Transfers: Sit to/from Stand, Bed to chair/wheelchair/BSC Sit to Stand: Min guard, From elevated surface           General transfer comment: Pt provided with CGA during functional transfers and functional mobility within room due to reports of increased RLE stiffness and pain when attempting to weight bear. Increased time/encouragement to perform, able to perform x6 reps from EOB. Pt with good recall of safe UE placement for EOB and bathroom transfers    Ambulation/Gait Ambulation/Gait assistance: Min guard Gait Distance (Feet): 25 Feet (39f, seated break, 122f Assistive device: Rolling walker (2 wheels) Gait Pattern/deviations: Step-to pattern, Decreased step length - left, Shuffle, Decreased dorsiflexion - right, Decreased dorsiflexion - left, Antalgic Gait velocity: <0.2 m/s  General Gait Details: VC's for improved posture, closer walker proximity, and forward gaze. No assist required but hands on guarding provided for safety, pt with  difficulty increasing LLE step length due to RLE pain/fatigue but with mod cues/encouragement, pt taking some longer steps on L. Shuffles throughout and pt reports LUE discomfort from IV preventing more UE support.       Balance Overall balance assessment: Needs assistance Sitting-balance support: Feet unsupported, No upper extremity supported Sitting balance-Leahy Scale: Good     Standing balance support: During functional activity, Bilateral upper extremity supported Standing balance-Leahy Scale: Poor Standing balance comment: Fair static standing with RW, less steady ambulating with RW due to c/o mild RLE buckling but no significant LOB                            Cognition Arousal/Alertness: Awake/alert Behavior During Therapy: WFL for tasks assessed/performed Overall Cognitive Status: Within Functional Limits for tasks assessed                                 General Comments: Pt anxious when left alone, pt with increased bowel urgency today (had to use bathroom just prior to session and at end of session).        Exercises General Exercises - Lower Extremity Ankle Circles/Pumps: AROM, Both, 10 reps, Seated Long Arc Quad: AROM, Both, 10 reps, Seated Hip Flexion/Marching: AROM, Seated, Standing, Right, 20 reps (multimodal cues needed, x10 reps ea posture (20 total)) Heel Raises: AROM, Both, 10 reps, Standing Other Exercises Other Exercises: STS from EOB x5 reps reciprocal using UE    General Comments General comments (skin integrity, edema, etc.): RN agreeable to assist pt off toilet after session, pt needed some time and oriented to wall pull bell.      Pertinent Vitals/Pain Pain Assessment Pain Assessment: Faces Faces Pain Scale: Hurts even more Pain Location: R hip and left wrist at IV site when attempting to weight bear into arms while using RW. Pain Descriptors / Indicators: Sore, Operative site guarding, Grimacing Pain Intervention(s):  Monitored during session, Repositioned (discussed pain scale scoring, pt initially states "10" but then says "my 10 is higher than most people's, it's not as bad as I have had in the past")     PT Goals (current goals can now be found in the care plan section) Acute Rehab PT Goals Patient Stated Goal: pt used to work at [employer], would like to go to rehab there then home PT Goal Formulation: With patient Time For Goal Achievement: 05/20/22 Progress towards PT goals: Progressing toward goals    Frequency    Min 3X/week      PT Plan Current plan remains appropriate       AM-PAC PT "6 Clicks" Mobility   Outcome Measure  Help needed turning from your back to your side while in a flat bed without using bedrails?: A Little Help needed moving from lying on your back to sitting on the side of a flat bed without using bedrails?: A Little Help needed moving to and from a bed to a chair (including a wheelchair)?: A Little Help needed standing up from a chair using your arms (e.g., wheelchair or bedside chair)?: A Little Help needed to walk in hospital room?: A Lot (mod cues for sequencing/safety) Help needed climbing 3-5 steps with a railing? : Total (not able to offload LLE  when stepping today due to pain) 6 Click Score: 15    End of Session Equipment Utilized During Treatment: Gait belt Activity Tolerance: Patient tolerated treatment well;Patient limited by pain Patient left: in chair;with call bell/phone within reach;Other (comment) (pt on toilet, RN notified and entering room to check on her at end of session) Nurse Communication: Mobility status;Patient requests pain meds PT Visit Diagnosis: Unsteadiness on feet (R26.81);Difficulty in walking, not elsewhere classified (R26.2);Pain Pain - Right/Left: Right Pain - part of body: Hip     Time: 8206-0156 PT Time Calculation (min) (ACUTE ONLY): 23 min  Charges:  $Gait Training: 8-22 mins $Therapeutic Exercise: 8-22 mins                      Mauro Arps P., PTA Acute Rehabilitation Services Secure Chat Preferred 9a-5:30pm Office: [place] 05/09/2022, 3:12 PM

## 2022-05-09 NOTE — Progress Notes (Signed)
Mobility Specialist Progress Note   05/09/22 1549  Orthostatic Lying   BP- Lying 118/62  Orthostatic Sitting  BP- Sitting 113/74  Orthostatic Standing at 0 minutes  BP- Standing at 0 minutes 103/69  Orthostatic Standing at 3 minutes  BP- Standing at 3 minutes 134/70  Mobility  Activity Ambulated with assistance in room  Level of Assistance Contact guard assist, steadying assist  Assistive Device Front wheel walker  Distance Ambulated (ft) 20 ft  RLE Weight Bearing WBAT  Activity Response Tolerated well  $Mobility charge 1 Mobility   Received in bed having no complaints and agreeable. Pt able to tolerate orthostatic testing w/o assistance, symptoms or faults. After complete, ambulated in room w/ antalgic like gait and slight instability and inability to clear RLE when taking a full step. IV team shortly arrived and mobility session was cut short. Left back in bed w/ call bell by side and IV team present in room.    Holland Falling Mobility Specialist Please contact via SecureChat or  Rehab office at [phone]

## 2022-05-10 DIAGNOSIS — E871 Hypo-osmolality and hyponatremia: Secondary | ICD-10-CM | POA: Diagnosis not present

## 2022-05-10 DIAGNOSIS — J849 Interstitial pulmonary disease, unspecified: Secondary | ICD-10-CM | POA: Diagnosis not present

## 2022-05-10 DIAGNOSIS — Z9481 Bone marrow transplant status: Secondary | ICD-10-CM

## 2022-05-10 DIAGNOSIS — S7291XA Unspecified fracture of right femur, initial encounter for closed fracture: Secondary | ICD-10-CM | POA: Diagnosis not present

## 2022-05-10 DIAGNOSIS — N179 Acute kidney failure, unspecified: Secondary | ICD-10-CM | POA: Diagnosis not present

## 2022-05-10 DIAGNOSIS — D0359 Melanoma in situ of other part of trunk: Secondary | ICD-10-CM | POA: Diagnosis not present

## 2022-05-10 DIAGNOSIS — Z862 Personal history of diseases of the blood and blood-forming organs and certain disorders involving the immune mechanism: Secondary | ICD-10-CM | POA: Diagnosis not present

## 2022-05-10 DIAGNOSIS — K7460 Unspecified cirrhosis of liver: Secondary | ICD-10-CM | POA: Diagnosis not present

## 2022-05-10 DIAGNOSIS — M80051A Age-related osteoporosis with current pathological fracture, right femur, initial encounter for fracture: Secondary | ICD-10-CM | POA: Diagnosis not present

## 2022-05-10 DIAGNOSIS — D696 Thrombocytopenia, unspecified: Secondary | ICD-10-CM | POA: Diagnosis not present

## 2022-05-10 DIAGNOSIS — Z8619 Personal history of other infectious and parasitic diseases: Secondary | ICD-10-CM | POA: Diagnosis not present

## 2022-05-10 LAB — RENAL FUNCTION PANEL
Albumin: 3.4 g/dL — ABNORMAL LOW (ref 3.5–5.0)
Anion gap: 11 (ref 5–15)
BUN: 25 mg/dL — ABNORMAL HIGH (ref 6–20)
CO2: 22 mmol/L (ref 22–32)
Calcium: 9.5 mg/dL (ref 8.9–10.3)
Chloride: 100 mmol/L (ref 98–111)
Creatinine, Ser: 1.11 mg/dL — ABNORMAL HIGH (ref 0.44–1.00)
GFR, Estimated: >60 mL/min (ref >60)
Glucose, Bld: 114 mg/dL — ABNORMAL HIGH (ref 70–99)
Phosphorus: 3.2 mg/dL (ref 2.5–4.6)
Potassium: 4.6 mmol/L (ref 3.5–5.1)
Sodium: 133 mmol/L — ABNORMAL LOW (ref 135–145)

## 2022-05-10 LAB — HEMOGLOBIN AND HEMATOCRIT, BLOOD
HCT: 31.4 % — ABNORMAL LOW (ref 36.0–46.0)
Hemoglobin: 10.5 g/dL — ABNORMAL LOW (ref 12.0–15.0)

## 2022-05-10 LAB — MAGNESIUM: Magnesium: 2 mg/dL (ref 1.7–2.4)

## 2022-05-10 MED ORDER — METOPROLOL TARTRATE 75 MG PO TABS: 75.0000 mg | ORAL_TABLET | Freq: Two times a day (BID) | ORAL | Status: DC

## 2022-05-10 MED ORDER — LEVOTHYROXINE SODIUM 125 MCG PO TABS: 62.5000 ug | ORAL_TABLET | Freq: Every day | ORAL | Status: DC

## 2022-05-10 MED ORDER — COSYNTROPIN 0.25 MG IJ SOLR: 0.2500 mg | Freq: Once | INTRAMUSCULAR | Status: DC

## 2022-05-10 MED ORDER — MIDODRINE HCL 5 MG PO TABS
2.5000 mg | ORAL_TABLET | Freq: Three times a day (TID) | ORAL | Status: DC
  Administered 2022-05-10: 2.5 mg via ORAL
  Filled 2022-05-10 (×2): qty 1

## 2022-05-10 MED ORDER — MIDODRINE HCL 2.5 MG PO TABS: 2.5000 mg | ORAL_TABLET | Freq: Three times a day (TID) | ORAL | Status: DC

## 2022-05-10 MED ORDER — METOPROLOL TARTRATE 50 MG PO TABS
75.0000 mg | ORAL_TABLET | Freq: Two times a day (BID) | ORAL | Status: DC
  Administered 2022-05-10: 75 mg via ORAL
  Filled 2022-05-10: qty 1

## 2022-05-10 MED ORDER — OXYCODONE HCL 5 MG PO TABS: 5.0000 mg | ORAL_TABLET | ORAL | Status: DC | PRN

## 2022-05-10 NOTE — TOC Progression Note (Addendum)
Transition of Care [facility]) - Progression Note    Patient Details  Name: Latoya Bautista MRN: [id number] Date of Birth: May 14, 1970  [facility] CM/SW Contact  Joanne Chars, LCSW Phone Number: 05/10/2022, 8:42 AM  Clinical Narrative:   SNF [facility] approved, MD notified.   1230: CSW confirmed with [facility] that they can accept pt today.  CSW spoke with pt regarding transportation to [employer] was able to arrange a ride with friends who can pick her up at [employer] entrance.    Expected Discharge Plan: [place] Barriers to Discharge: Continued Medical Work up, SNF Pending bed offer  Expected Discharge Plan and Services In-house Referral: Clinical Social Work   Post Acute Care Choice: [place] Living arrangements for the past 2 months: Single Family Home Expected Discharge Date: 05/09/22                                     Social Determinants of Health (SDOH) Interventions SDOH Screenings   Food Insecurity: No Food Insecurity (05/06/2022)  Recent Concern: [place] Present (03/15/2022)  Housing: Low Risk  (05/06/2022)  Transportation Needs: No Transportation Needs (05/06/2022)  Utilities: Not At Risk (05/06/2022)  Alcohol Screen: Low Risk  (03/15/2022)  Depression (PHQ2-9): Low Risk  (03/15/2022)  Financial Resource Strain: High Risk (03/15/2022)  Physical Activity: Sufficiently Active (03/15/2022)  Social Connections: Socially Integrated (03/15/2022)  Stress: No Stress Concern Present (03/15/2022)  Tobacco Use: Low Risk  (05/09/2022)    Readmission Risk Interventions     No data to display

## 2022-05-10 NOTE — Plan of Care (Signed)
  Problem: Education: Goal: Knowledge of General Education information will improve Description: Including pain rating scale, medication(s)/side effects and non-pharmacologic comfort measures Outcome: Progressing   Problem: Health Behavior/Discharge Planning: Goal: Ability to manage health-related needs will improve Outcome: Progressing   Problem: Activity: Goal: Risk for activity intolerance will decrease Outcome: Progressing   

## 2022-05-10 NOTE — Discharge Summary (Signed)
Physician Discharge Summary  Latoya Bautista MWU:[id number] DOB: 12-06-1970 DOA: 05/05/2022  PCP: [name], MD  Admit date: 05/05/2022 Discharge date: 05/10/2022 Admitted From: Home Disposition: SNF Recommendations for Outpatient Follow-up:  Follow up with orthopedic surgery in 2 weeks. Check CMP and CBC in 1 to 2 weeks Check TSH in 4 to 6 weeks and adjust Synthroid dose as appropriate Reassess blood pressure and heart rate and adjust medications as appropriate Recommend workup for adrenal insufficiency Please follow up on the following pending results: ACTH level   Discharge Condition: Stable CODE STATUS: Full code  Follow-up Information     Haddix, Latoya Lot, MD Follow up in 2 week(s).   Specialty: Orthopedic Surgery Contact information: [place] [address] [facility] course 52 year old F with PMH of leukemia, tongue cancer s/p resection, hepatitis C, liver cirrhosis, pulmonary fibrosis, seizure and chronic hyponatremia admitted with right hip fracture after fall at home.  She also had AKI and hyponatremia.  Patient underwent intramedullary nailing on 05/06/2021.  AKI improved.  Hyponatremia stable.     Patient has mild tachycardia with intermittent soft blood pressures.  TSH low at 0.112.  Decrease Synthroid from 75 to 62.5 mcg daily.  Recommend rechecking TSH in 4 to 6 weeks and adjusting Synthroid as appropriate.   We have also increased metoprolol from 50 to 75 mg twice daily, discontinued Benicar and added low-dose midodrine.  Reassess heart rate and blood pressure and adjust medications as appropriate.    Therapy recommended SNF. Medically optimized for discharge pending insurance authorization.  Outpatient follow-up with orthopedic surgery in 2 weeks.  See individual problem list below for more.   Problems addressed during this hospitalization Principal Problem:   Pathological fracture of right femur due to  osteoporosis [facility]) Active Problems:   History of hepatitis C   Liver cirrhosis (HCC)   Thrombocytopenia (HCC)   Localization-related epilepsy (HCC)   Melanoma in situ of skin of buttock (HCC)   History of thrombocytopenia   ILD (interstitial lung disease) ([place])   Bone marrow transplant status ([place])   Femur fracture, right (HCC)   AKI (acute kidney injury) ([place])   Chronic hyponatremia   Accidental fall at home Osteoporotic pathological fracture of right femur  -S/p intramedullary nailing by Dr. [name] on 05/06/2022. -Pain control and DVT prophylaxis per surgery -Bowel regimen as needed for constipation -On Forteo and vitamin D for osteoporosis. -On Eliquis for VTE prophylaxis.  As needed oxycodone for pain control. -PT/OT-recommended SNF.   Acute kidney injury: B/l Cr 0.7-0.8.  Improving. Recent Labs (within last 365 days)          Recent Labs    11/07/21 1534 05/05/22 1858 05/06/22 0145 05/07/22 0417 05/08/22 0300 05/09/22 0405  BUN '11 12 12 17 '$ 26* 22*  CREATININE 0.78 1.31* 1.21* 1.49* 1.37* 1.16*    -Encourage oral hydration -Recheck in the morning. -Benicar discontinued.   Hypotension/sinus tachycardia: Hypotension resolved and tachycardia improved after increasing metoprolol and starting low-dose midodrine. TTE in 12/2021 with normal LVEF and indeterminate DD.TSH low at 0.112.  Denies recent changes to his Synthroid dose.  -Increase metoprolol from 50 to 75 mg twice daily -Discontinued Benicar and added low-dose midodrine. -Decreased Synthroid from 75 mcg to 62.5 mcg.  Repeat TSH in 4 to 6 weeks -Random cortisol 14.4.  ACTH level pending.   -May consider further evaluation for adrenal insufficiency -  Orthostatic vitals   Hyponatremia/mild hyperkalemia: Hyponatremia improved.  Hypokalemia resolved.   -Follow ACTH level -Consider evaluation for adrenal insufficiency   Hypothyroidism: TSH low at 0.112 -Decreased Synthroid from 75 mcg to 62.5 mcg daily    Dyslipidemia: -Continue home Tricor and Crestor   Seizure: -Continue Lamictal    GERD: -Continue Protonix.   Osteoporosis: Vitamin D level 52. -Forteo and vitamin D    ILD (interstitial lung disease) ([place]): -Outpatient follow-up with pulmonology   Hepatitis C/liver cirrhosis: -Treated for hepatitis C.   Melanoma in situ of skin of buttock (HCC)   Increased nutrient needs Nutrition Problem: Increased nutrient needs Etiology: post-op healing Signs/Symptoms: estimated needs Interventions: MVI, Refer to RD note for recommendations     Vital signs Vitals:   05/09/22 1428 05/09/22 1935 05/10/22 0509 05/10/22 0858  BP: (!) 94/59 135/63 (!) 102/57 130/62  Pulse: 100 (!) 121 (!) 121 (!) 105  Temp: 98.1 F (36.7 C) 98.2 F (36.8 C) 98.2 F (36.8 C) 98 F (36.7 C)  Resp: '17 18 18 19  '$ Height:      Weight:      SpO2: 94% 99%  100%  TempSrc: Oral Oral Oral Oral  BMI (Calculated):         Discharge exam  GENERAL: No apparent distress.  Nontoxic. HEENT: MMM.  Vision and hearing grossly intact.  NECK: Supple.  No apparent JVD.  RESP:  No IWOB.  Fair aeration bilaterally. CVS: HR in is from 90s to 100.  Heart sounds normal.  ABD/GI/GU: BS+. Abd soft, NTND.  MSK/EXT:  Moves extremities. No apparent deformity. No edema.  SKIN: Dressing over left DCI. NEURO: Awake and alert. Oriented appropriately.  No apparent focal neuro deficit. PSYCH: Calm. Normal affect.   Discharge Instructions Discharge Instructions     Diet general   Complete by: As directed    Discharge wound care:   Complete by: As directed    Reinforce dressing  Until discontinued   Increase activity slowly   Complete by: As directed       Allergies as of 05/10/2022       Reactions   Simvastatin Rash   Briviact [brivaracetam]    Elevated LFT's   Asparaginase Derivatives Rash   Elspar [asparaginase] Rash   Keppra [levetiracetam] Rash   Soap Rash, Other (See Comments)   Procedure prep-soap (not  Betadine, either- per the patient)   Tape Rash, Other (See Comments)   Redness from the tape        Medication List     STOP taking these medications    cephALEXin 500 MG capsule Commonly known as: KEFLEX   olmesartan 20 MG tablet Commonly known as: BENICAR       TAKE these medications    acetaminophen 500 MG tablet Commonly known as: TYLENOL Take 500-1,000 mg by mouth every 6 (six) hours as needed for moderate pain.   apixaban 2.5 MG Tabs tablet Commonly known as: Eliquis Take 1 tablet (2.5 mg total) by mouth 2 (two) times daily.   ascorbic acid 500 MG tablet Commonly known as: VITAMIN C Take 500 mg by mouth daily.   CENTRUM PO Take 1 tablet by mouth daily.   cholecalciferol 1000 units tablet Commonly known as: VITAMIN D Take 1,000 Units by mouth daily.   estradiol 1 MG tablet Commonly known as: ESTRACE Take 1 tablet (1 mg total) by mouth daily.   famotidine 20 MG tablet Commonly known as: PEPCID Take 1 tablet (20 mg total) by  mouth 2 (two) times daily.   fenofibrate 145 MG tablet Commonly known as: TRICOR Take 145 mg by mouth daily.   folic acid 1 MG tablet Commonly known as: FOLVITE TAKE 1 TABLET ONCE DAILY.   Forteo 600 MCG/2.4ML Sopn Generic drug: Teriparatide (Recombinant) Inject 1 Dose into the skin daily.   Garlic 4665 MG Caps Take 1 capsule by mouth daily.   lamoTRIgine 50 MG 24 hour tablet Commonly known as: LAMICTAL XR Take 3 tablets (150 mg total) by mouth 2 (two) times daily.   levothyroxine 125 MCG tablet Commonly known as: Synthroid Take 0.5 tablets (62.5 mcg total) by mouth daily. Start taking on: May 11, 2022 What changed:  medication strength how much to take   Metoprolol Tartrate 75 MG Tabs Take 75 mg by mouth 2 (two) times daily. What changed:  medication strength how much to take when to take this additional instructions Another medication with the same name was removed. Continue taking this medication, and  follow the directions you see here.   midodrine 2.5 MG tablet Commonly known as: PROAMATINE Take 1 tablet (2.5 mg total) by mouth 3 (three) times daily with meals.   oxyCODONE 5 MG immediate release tablet Commonly known as: Oxy IR/ROXICODONE Take 1 tablet (5 mg total) by mouth every 6 (six) hours as needed for severe pain.   pantoprazole 40 MG tablet Commonly known as: PROTONIX Take 1 tablet (40 mg total) by mouth daily. What changed: when to take this   progesterone 100 MG capsule Commonly known as: PROMETRIUM Take 1 capsule (100 mg total) by mouth daily for 15 days per month.   rosuvastatin 40 MG tablet Commonly known as: Crestor Take 1 tablet (40 mg total) by mouth at bedtime.   sodium bicarbonate 325 MG tablet Take 162.5 mg by mouth 2 (two) times daily.   Vitamin D (Ergocalciferol) 1.25 MG (50000 UNIT) Caps capsule Commonly known as: DRISDOL Take 1 capsule (50,000 Units total) by mouth every OTHER week as directed What changed:  when to take this additional instructions   vitamin E 180 MG (400 UNITS) capsule Take 400 Units by mouth 2 (two) times daily.               Discharge Care Instructions  (From admission, onward)           Start     Ordered   05/10/22 0000  Discharge wound care:       Comments: Reinforce dressing  Until discontinued   05/10/22 1214            Consultations: Orthopedic surgery  Procedures/Studies: 05/06/2022-intramedullary nailing of right femoral fracture    DG FEMUR PORT, MIN 2 VIEWS RIGHT  Result Date: 05/06/2022 CLINICAL DATA:  Right femoral fracture repair EXAM: RIGHT FEMUR PORTABLE 2 VIEW COMPARISON:  None Available. FINDINGS: Patient is status post repair of a right femoral fracture. Postoperative air seen in the soft tissues. An intramedullary rod extends from the proximal femur to the distal femur with 2 distal interlocking screws. Alignment is near anatomic by the end of the study. Two screws traverse the  intertrochanteric region into the femoral neck and head, in good position. IMPRESSION: Right femoral fracture repair as above. Alignment is near anatomic by the end of the study. Electronically Signed   By: Dorise Bullion III M.D.   On: 05/06/2022 11:31   DG FEMUR, MIN 2 VIEWS RIGHT  Result Date: 05/06/2022 CLINICAL DATA:  Right femoral fracture repair Fluoroscopy time: 2 minutes  and 18 seconds Cumulative air kerma: 14.16 mGy Images: 8 EXAM: RIGHT FEMUR 2 VIEWS COMPARISON:  None Available. FINDINGS: Eight images were obtained during repair of the right femoral fracture. By the end of the study, an intramedullary rod extends into the distal femur with 2 distal interlocking screws. Two screws traverse the intertrochanteric region into the femoral neck and head. Hardware is in good position. IMPRESSION: Right femoral fracture repair as above. Electronically Signed   By: Dorise Bullion III M.D.   On: 05/06/2022 11:30   DG C-Arm 1-60 Min-No Report  Result Date: 05/06/2022 Fluoroscopy was utilized by the requesting physician.  No radiographic interpretation.   DG C-Arm 1-60 Min-No Report  Result Date: 05/06/2022 Fluoroscopy was utilized by the requesting physician.  No radiographic interpretation.   DG C-Arm 1-60 Min-No Report  Result Date: 05/06/2022 Fluoroscopy was utilized by the requesting physician.  No radiographic interpretation.   DG FEMUR, MIN 2 VIEWS RIGHT  Result Date: 05/05/2022 CLINICAL DATA:  Recent fall downstairs with leg pain and deformity, initial encounter EXAM: RIGHT FEMUR 2 VIEWS COMPARISON:  None Available. FINDINGS: Oblique fracture is noted through the proximal diaphysis. Distal fracture fragment is displaced one bone width laterally and posteriorly. No other fractures are seen. No soft tissue abnormality is noted. IMPRESSION: Oblique fracture through the proximal diaphysis as described. Electronically Signed   By: Inez Catalina M.D.   On: 05/05/2022 19:19   DG Pelvis  Portable  Result Date: 05/05/2022 CLINICAL DATA:  Recent fall downstairs with right leg pain, initial encounter EXAM: PORTABLE PELVIS 1 VIEWS COMPARISON:  None Available. FINDINGS: Pelvic ring is intact. The femoral heads are well seated. There is an oblique fracture through the midshaft of the right femur not completely visualized on this exam. No soft tissue abnormality is noted. IMPRESSION: Oblique fracture through the midshaft of the right femur incompletely evaluated on this study. Electronically Signed   By: Inez Catalina M.D.   On: 05/05/2022 19:18       The results of significant diagnostics from this hospitalization (including imaging, microbiology, ancillary and laboratory) are listed below for reference.     Microbiology: Recent Results (from the past 240 hour(s))  Surgical pcr screen     Status: None   Collection Time: 05/06/22  4:09 AM   Specimen: Nasal Mucosa; Nasal Swab  Result Value Ref Range Status   MRSA, PCR NEGATIVE NEGATIVE Final   Staphylococcus aureus NEGATIVE NEGATIVE Final    Comment: (NOTE) The Xpert SA Assay (FDA approved for NASAL specimens in patients 34 years of age and older), is one component of a comprehensive surveillance program. It is not intended to diagnose infection nor to guide or monitor treatment. Performed at [facility], [place] [address] [place], [place] [address]      Labs:  CBC: Recent Labs  Lab 05/05/22 1858 05/06/22 0145 05/07/22 0417 05/08/22 0300 05/09/22 0405 05/10/22 0509  WBC 8.5 10.6* 14.9* 13.3* 11.7*  --   NEUTROABS 5.1  --   --   --   --   --   HGB 12.9 11.4* 10.0* 9.1* 9.4* 10.5*  HCT 38.6 32.7* 29.9* 27.6* 27.3* 31.4*  MCV 99.2 97.6 100.7* 100.7* 98.9  --   PLT 210 187 179 166 188  --    BMP &GFR Recent Labs  Lab 05/06/22 0145 05/07/22 0417 05/08/22 0300 05/09/22 0405 05/10/22 0509  NA 135 131* 136 129* 133*  K 4.8 5.2* 4.5 4.4 4.6  CL 101 101  102 100 100  CO2 23 21* '23 22 22  '$ GLUCOSE 110* 124*  96 110* 114*  BUN 12 17 26* 22* 25*  CREATININE 1.21* 1.49* 1.37* 1.16* 1.11*  CALCIUM 9.6 9.0 9.0 9.2 9.5  MG  --  1.9  --  1.9 2.0  PHOS  --  3.3 2.6 2.8 3.2   Estimated Creatinine Clearance: 48.6 mL/min (A) (by C-G formula based on SCr of 1.11 mg/dL (H)). Liver & Pancreas: Recent Labs  Lab 05/05/22 1858 05/07/22 0417 05/08/22 0300 05/09/22 0405 05/10/22 0509  AST 40  --   --   --   --   ALT 26  --   --   --   --   ALKPHOS 79  --   --   --   --   BILITOT 0.4  --   --   --   --   PROT 7.3  --   --   --   --   ALBUMIN 4.2 3.5 3.3* 3.3* 3.4*   No results for input(s): "LIPASE", "AMYLASE" in the last 168 hours. No results for input(s): "AMMONIA" in the last 168 hours. Diabetic: No results for input(s): "HGBA1C" in the last 72 hours. No results for input(s): "GLUCAP" in the last 168 hours. Cardiac Enzymes: No results for input(s): "CKTOTAL", "CKMB", "CKMBINDEX", "TROPONINI" in the last 168 hours. No results for input(s): "PROBNP" in the last 8760 hours. Coagulation Profile: No results for input(s): "INR", "PROTIME" in the last 168 hours. Thyroid Function Tests: Recent Labs    05/08/22 0300  TSH 0.112*   Lipid Profile: No results for input(s): "CHOL", "HDL", "LDLCALC", "TRIG", "CHOLHDL", "LDLDIRECT" in the last 72 hours. Anemia Panel: Recent Labs    05/09/22 0405  VITAMINB12 1,435*  FOLATE >40.0  FERRITIN 180  TIBC 374  IRON 70  RETICCTPCT 2.1   Urine analysis:    Component Value Date/Time   COLORURINE YELLOW 07/28/2014 0600   APPEARANCEUR CLEAR 07/28/2014 0600   LABSPEC >1.030 (H) 07/28/2014 0600   PHURINE 5.5 07/28/2014 0600   GLUCOSEU NEGATIVE 07/28/2014 0600   HGBUR NEGATIVE 07/28/2014 0600   BILIRUBINUR NEGATIVE 07/28/2014 0600   KETONESUR NEGATIVE 07/28/2014 0600   PROTEINUR 100 (A) 07/28/2014 0600   UROBILINOGEN 0.2 07/28/2014 0600   NITRITE NEGATIVE 07/28/2014 0600   LEUKOCYTESUR NEGATIVE 07/28/2014 0600   Sepsis Labs: Invalid input(s):  "PROCALCITONIN", "LACTICIDVEN"   SIGNED:  Mercy Riding, MD  [facility] 05/10/2022, 12:18 PM

## 2022-05-10 NOTE — TOC Transition Note (Signed)
Transition of Care [facility]) - CM/SW Discharge Note   Patient Details  Name: Latoya Bautista MRN: [id number] Date of Birth: 01/22/1971  [facility]) CM/SW Contact:  Joanne Chars, LCSW Phone Number: 05/10/2022, 1:39 PM   Clinical Narrative:  Pt discharging to [facility], room A9-1. RN call report to [phone].   Pt has arranged transportation with a friend, who will pick her up at the [facility].  RN informed.     Final next level of care: Skilled Nursing Facility Barriers to Discharge: Barriers Resolved   Patient Goals and CMS Choice      Discharge Placement                Patient chooses bed at:  [facility]) Patient to be transferred to facility by: friend Name of family member notified: left message with husband Shanon Brow.  Pt also reported she spoke with him. Patient and family notified of of transfer: 05/10/22  Discharge Plan and Services Additional resources added to the After Visit Summary for   In-house Referral: Clinical Social Work   Post Acute Care Choice: [facility]                               Social Determinants of Health (SDOH) Interventions SDOH Screenings   Food Insecurity: No Food Insecurity (05/06/2022)  Recent Concern: [place] Present (03/15/2022)  Housing: Low Risk  (05/06/2022)  Transportation Needs: No Transportation Needs (05/06/2022)  Utilities: Not At Risk (05/06/2022)  Alcohol Screen: Low Risk  (03/15/2022)  Depression (PHQ2-9): Low Risk  (03/15/2022)  Financial Resource Strain: High Risk (03/15/2022)  Physical Activity: Sufficiently Active (03/15/2022)  Social Connections: Socially Integrated (03/15/2022)  Stress: No Stress Concern Present (03/15/2022)  Tobacco Use: Low Risk  (05/09/2022)     Readmission Risk Interventions     No data to display

## 2022-05-10 NOTE — Progress Notes (Signed)
Mobility Specialist Progress Note   05/10/22 1130  Mobility  Activity Ambulated with assistance in hallway  Level of Assistance Contact guard assist, steadying assist  Assistive Device Front wheel walker  Distance Ambulated (ft) 150 ft  RLE Weight Bearing WBAT  Activity Response Tolerated well  Mobility Referral Yes  $Mobility charge 1 Mobility   Received pt in bed having no complaints and agreeable. Slow antalgic but steady gait for hallway ambulation. No faults throughout session, returned to room w/ all needs met and call bell in reach.  Holland Falling Mobility Specialist Please contact via SecureChat or  Rehab office at [phone]

## 2022-05-12 LAB — ACTH: C206 ACTH: 3.6 pg/mL — ABNORMAL LOW (ref 7.2–63.3)

## 2022-05-15 DIAGNOSIS — S7291XA Unspecified fracture of right femur, initial encounter for closed fracture: Secondary | ICD-10-CM | POA: Diagnosis not present

## 2022-05-15 DIAGNOSIS — I2510 Atherosclerotic heart disease of native coronary artery without angina pectoris: Secondary | ICD-10-CM | POA: Diagnosis not present

## 2022-05-15 DIAGNOSIS — E875 Hyperkalemia: Secondary | ICD-10-CM | POA: Diagnosis not present

## 2022-05-15 DIAGNOSIS — E039 Hypothyroidism, unspecified: Secondary | ICD-10-CM | POA: Diagnosis not present

## 2022-05-15 DIAGNOSIS — G40909 Epilepsy, unspecified, not intractable, without status epilepticus: Secondary | ICD-10-CM | POA: Diagnosis not present

## 2022-05-15 DIAGNOSIS — E46 Unspecified protein-calorie malnutrition: Secondary | ICD-10-CM | POA: Diagnosis not present

## 2022-05-15 DIAGNOSIS — M6281 Muscle weakness (generalized): Secondary | ICD-10-CM | POA: Diagnosis not present

## 2022-05-15 DIAGNOSIS — E871 Hypo-osmolality and hyponatremia: Secondary | ICD-10-CM | POA: Diagnosis not present

## 2022-05-16 DIAGNOSIS — I959 Hypotension, unspecified: Secondary | ICD-10-CM | POA: Diagnosis not present

## 2022-05-16 DIAGNOSIS — E039 Hypothyroidism, unspecified: Secondary | ICD-10-CM | POA: Diagnosis not present

## 2022-05-16 DIAGNOSIS — S7291XD Unspecified fracture of right femur, subsequent encounter for closed fracture with routine healing: Secondary | ICD-10-CM | POA: Diagnosis not present

## 2022-05-16 DIAGNOSIS — M810 Age-related osteoporosis without current pathological fracture: Secondary | ICD-10-CM | POA: Diagnosis not present

## 2022-05-16 DIAGNOSIS — E785 Hyperlipidemia, unspecified: Secondary | ICD-10-CM | POA: Diagnosis not present

## 2022-05-16 DIAGNOSIS — K219 Gastro-esophageal reflux disease without esophagitis: Secondary | ICD-10-CM | POA: Diagnosis not present

## 2022-05-16 DIAGNOSIS — R000 Tachycardia, unspecified: Secondary | ICD-10-CM | POA: Diagnosis not present

## 2022-05-16 DIAGNOSIS — G40909 Epilepsy, unspecified, not intractable, without status epilepticus: Secondary | ICD-10-CM | POA: Diagnosis not present

## 2022-05-20 DIAGNOSIS — G40909 Epilepsy, unspecified, not intractable, without status epilepticus: Secondary | ICD-10-CM | POA: Diagnosis not present

## 2022-05-20 DIAGNOSIS — S7291XD Unspecified fracture of right femur, subsequent encounter for closed fracture with routine healing: Secondary | ICD-10-CM | POA: Diagnosis not present

## 2022-05-20 DIAGNOSIS — E039 Hypothyroidism, unspecified: Secondary | ICD-10-CM | POA: Diagnosis not present

## 2022-05-20 DIAGNOSIS — M810 Age-related osteoporosis without current pathological fracture: Secondary | ICD-10-CM | POA: Diagnosis not present

## 2022-05-20 DIAGNOSIS — E785 Hyperlipidemia, unspecified: Secondary | ICD-10-CM | POA: Diagnosis not present

## 2022-05-20 DIAGNOSIS — I959 Hypotension, unspecified: Secondary | ICD-10-CM | POA: Diagnosis not present

## 2022-05-20 DIAGNOSIS — K219 Gastro-esophageal reflux disease without esophagitis: Secondary | ICD-10-CM | POA: Diagnosis not present

## 2022-05-20 DIAGNOSIS — R000 Tachycardia, unspecified: Secondary | ICD-10-CM | POA: Diagnosis not present

## 2022-05-23 DIAGNOSIS — K219 Gastro-esophageal reflux disease without esophagitis: Secondary | ICD-10-CM | POA: Diagnosis not present

## 2022-05-23 DIAGNOSIS — M810 Age-related osteoporosis without current pathological fracture: Secondary | ICD-10-CM | POA: Diagnosis not present

## 2022-05-23 DIAGNOSIS — I959 Hypotension, unspecified: Secondary | ICD-10-CM | POA: Diagnosis not present

## 2022-05-23 DIAGNOSIS — R000 Tachycardia, unspecified: Secondary | ICD-10-CM | POA: Diagnosis not present

## 2022-05-23 DIAGNOSIS — E785 Hyperlipidemia, unspecified: Secondary | ICD-10-CM | POA: Diagnosis not present

## 2022-05-23 DIAGNOSIS — S7291XD Unspecified fracture of right femur, subsequent encounter for closed fracture with routine healing: Secondary | ICD-10-CM | POA: Diagnosis not present

## 2022-05-23 DIAGNOSIS — G40909 Epilepsy, unspecified, not intractable, without status epilepticus: Secondary | ICD-10-CM | POA: Diagnosis not present

## 2022-05-23 DIAGNOSIS — E039 Hypothyroidism, unspecified: Secondary | ICD-10-CM | POA: Diagnosis not present

## 2022-05-27 ENCOUNTER — Ambulatory Visit (INDEPENDENT_AMBULATORY_CARE_PROVIDER_SITE_OTHER): Payer: Medicaid Other | Admitting: Podiatry

## 2022-05-27 DIAGNOSIS — L600 Ingrowing nail: Secondary | ICD-10-CM

## 2022-05-27 DIAGNOSIS — S72301D Unspecified fracture of shaft of right femur, subsequent encounter for closed fracture with routine healing: Secondary | ICD-10-CM | POA: Diagnosis not present

## 2022-05-27 NOTE — Progress Notes (Signed)
Subjective: Chief Complaint  Patient presents with   Nail Problem    nail check, biopsy result    52 year old female presents the office today with the above concerns.  She said that she been doing well from a toe standpoint.  Unfortunate she fell and broke her femur.  She was in the hospital and then rehab.  She just got home on Friday she went back to soaking her toe.  Denies any drainage or pus.  No significant pain.     Objective: AAO x3, NAD DP/PT pulses palpable bilaterally, CRT less than 3 seconds Status post nail avulsion right hallux.  There is still some granulation tissue present along the nailbed.  There is minimal surrounding edema.  There is no drainage or pus.  No ascending cellulitis.  No fluctuance or crepitation but there is no exposed bone.  No soft tissue mass. No pain with calf compression, swelling, warmth, erythema  Assessment: Status post nail avulsion improving  Plan: -All treatment options discussed with the patient including all alternatives, risks, complications.  -Overall symptoms doing better.  Discussed continue second Epsom salts With antibiotic ointment and a bandage of the day but can leave the area open at nighttime.  Continues to the area is healed.  Monitor for any signs or symptoms of infection. -Discussed with her surgery to remove likely osteochondroma that is present.  Would hold off on this for now she recently had her femur surgery. -Monitor for any clinical signs or symptoms of infection and directed to call the office immediately should any occur or go to the ER.  Trula Slade DPM

## 2022-06-03 DIAGNOSIS — Z85820 Personal history of malignant melanoma of skin: Secondary | ICD-10-CM | POA: Diagnosis not present

## 2022-06-03 DIAGNOSIS — C44301 Unspecified malignant neoplasm of skin of nose: Secondary | ICD-10-CM | POA: Diagnosis not present

## 2022-06-03 DIAGNOSIS — Z85828 Personal history of other malignant neoplasm of skin: Secondary | ICD-10-CM | POA: Diagnosis not present

## 2022-06-03 DIAGNOSIS — D229 Melanocytic nevi, unspecified: Secondary | ICD-10-CM | POA: Diagnosis not present

## 2022-06-03 DIAGNOSIS — L821 Other seborrheic keratosis: Secondary | ICD-10-CM | POA: Diagnosis not present

## 2022-06-03 DIAGNOSIS — L814 Other melanin hyperpigmentation: Secondary | ICD-10-CM | POA: Diagnosis not present

## 2022-06-03 DIAGNOSIS — L818 Other specified disorders of pigmentation: Secondary | ICD-10-CM | POA: Diagnosis not present

## 2022-06-04 DIAGNOSIS — C021 Malignant neoplasm of border of tongue: Secondary | ICD-10-CM | POA: Diagnosis not present

## 2022-06-04 DIAGNOSIS — G40109 Localization-related (focal) (partial) symptomatic epilepsy and epileptic syndromes with simple partial seizures, not intractable, without status epilepticus: Secondary | ICD-10-CM | POA: Diagnosis not present

## 2022-06-04 DIAGNOSIS — E782 Mixed hyperlipidemia: Secondary | ICD-10-CM | POA: Diagnosis not present

## 2022-06-04 DIAGNOSIS — M80051D Age-related osteoporosis with current pathological fracture, right femur, subsequent encounter for fracture with routine healing: Secondary | ICD-10-CM | POA: Diagnosis not present

## 2022-06-04 DIAGNOSIS — M8580 Other specified disorders of bone density and structure, unspecified site: Secondary | ICD-10-CM | POA: Diagnosis not present

## 2022-06-04 DIAGNOSIS — C52 Malignant neoplasm of vagina: Secondary | ICD-10-CM | POA: Diagnosis not present

## 2022-06-04 DIAGNOSIS — S728X2S Other fracture of left femur, sequela: Secondary | ICD-10-CM | POA: Diagnosis not present

## 2022-06-04 DIAGNOSIS — R7301 Impaired fasting glucose: Secondary | ICD-10-CM | POA: Diagnosis not present

## 2022-06-04 DIAGNOSIS — E039 Hypothyroidism, unspecified: Secondary | ICD-10-CM | POA: Diagnosis not present

## 2022-06-04 DIAGNOSIS — I959 Hypotension, unspecified: Secondary | ICD-10-CM | POA: Diagnosis not present

## 2022-06-04 DIAGNOSIS — N1832 Chronic kidney disease, stage 3b: Secondary | ICD-10-CM | POA: Diagnosis not present

## 2022-06-04 DIAGNOSIS — S61101D Unspecified open wound of right thumb with damage to nail, subsequent encounter: Secondary | ICD-10-CM | POA: Diagnosis not present

## 2022-06-04 DIAGNOSIS — J849 Interstitial pulmonary disease, unspecified: Secondary | ICD-10-CM | POA: Diagnosis not present

## 2022-06-04 DIAGNOSIS — R000 Tachycardia, unspecified: Secondary | ICD-10-CM | POA: Diagnosis not present

## 2022-06-05 ENCOUNTER — Ambulatory Visit (HOSPITAL_COMMUNITY): Payer: Medicaid Other | Attending: Student

## 2022-06-05 ENCOUNTER — Other Ambulatory Visit: Payer: Self-pay

## 2022-06-05 DIAGNOSIS — R262 Difficulty in walking, not elsewhere classified: Secondary | ICD-10-CM | POA: Diagnosis not present

## 2022-06-05 NOTE — Therapy (Addendum)
OUTPATIENT PHYSICAL THERAPY LOWER EXTREMITY EVALUATION   Patient Name: Latoya Bautista MRN: [id number] DOB:20-Jul-1970, 52 y.o., female Today's Date: 06/05/2022  END OF SESSION:  PT End of Session - 06/05/22 0814     Visit Number 1    Number of Visits 16    Date for PT Re-Evaluation 07/31/22    Authorization Type N Medicaid [employer] (requested for 16 visits, please check)    Progress Note Due on Visit 8    PT Start Time 0730    PT Stop Time 0810    PT Time Calculation (min) 40 min    Activity Tolerance Patient tolerated treatment well             Past Medical History:  Diagnosis Date   Anemia    Cancer ([place])    tongue cancer   H/O echocardiogram    a. 07/2017: echo showing EF of 55-60%, Grade 1 DD, and no significant valve abnormalities.    Hepatitis C    Hepatitis C 04/11/2011   Leukemia ([place])    Liver cirrhosis ([place]) 04/11/2011   Osteoporosis    Pulmonary fibrosis (HCC)    Seizures (HCC)    Shingles    Thrombocytopenia ([place]) 04/11/2011   Thyroid disease    Past Surgical History:  Procedure Laterality Date   BONE MARROW BIOPSY  05/07/2011   BONE MARROW TRANSPLANT     BUNIONECTOMY     CHOLECYSTECTOMY     FEMUR IM NAIL Right 05/06/2022   Procedure: INTRAMEDULLARY (IM) NAIL FEMORAL;  Surgeon: Shona Needles, MD;  Location: [place];  Service: Orthopedics;  Laterality: Right;   liver biopsie     TONGUE BIOPSY     tongue cancer     surgical removal of area   Patient Active Problem List   Diagnosis Date Noted   AKI (acute kidney injury) ([place]) 05/07/2022   Chronic hyponatremia 05/07/2022   Pathological fracture of right femur due to osteoporosis ([place]) 05/07/2022   Femur fracture, right ([place]) 05/05/2022   Malignant neoplasm of tongue, tip and lateral border ([place]) 05/30/2021   Bone marrow transplant status ([place]) 05/22/2021   ILD (interstitial lung disease) ([place]) 04/18/2021   Squamous cell carcinoma of lateral tongue ([place]) 12/12/2020   Melanoma in situ of skin of  buttock ([place]) 08/27/2018   Vulvar lesion 06/09/2018   History of thrombocytopenia 12/11/2015   Well woman exam with routine gynecological exam 10/18/2013   Localization-related epilepsy ([place]) 01/01/2013   Partial epilepsy with impairment of consciousness ([place]) 12/15/2012   Postmenopausal bleeding DUE TO hormone tx. 10/08/2012   History of hepatitis C 04/11/2011   Liver cirrhosis ([place]) 04/11/2011   Thrombocytopenia ([place]) 04/11/2011    PCP: Allyn Kenner MD  REFERRING PROVIDER: Corinne Ports, PA-C  REFERRING DIAG: R femoral shaft fx  THERAPY DIAG:  Difficulty in walking involving lower leg joint  Rationale for Evaluation and Treatment: Rehabilitation  ONSET DATE: 05/05/22  SUBJECTIVE:   SUBJECTIVE STATEMENT: Patient denies any pain at the moment (0/10). However, the R knee hurts at times at night (2/10 on NPRS). Patient reports that the R leg will give way at times. Patient is still having difficulty bending over. Condition started on 05/05/22, when patient missed the step on the stairs. Patient was taken to the hospital and X-ray was done which revealed R femoral fx. Patient then had surgery 05/06/22 (intramedullary nail femoral). Patient received PT in the hospital and was able to walk with a walker. Patient was then sent to  [place] to continue PT for 19 days. Patient is now ambulatory with a quad cane and was sent to outpatient PT evaluation and management.  PERTINENT HISTORY: Osteoporosis, thrombocytopenia, breathing issues PAIN:  Are you having pain? No  PRECAUTIONS: Fall  WEIGHT BEARING RESTRICTIONS: No  FALLS:  Has patient fallen in last 6 months? Yes. Number of falls 1  LIVING ENVIRONMENT: Lives with: lives with their family and lives with their spouse Lives in: House/apartment Stairs: Yes: Internal: 16 steps; can reach both and External: 3 steps; on left going up Has following equipment at home: Quad cane small base and Grab bars  OCCUPATION: on  disability  PLOF: Independent  PATIENT GOALS: "to walk better"  NEXT MD VISIT: 06/25/22  OBJECTIVE:   Ambulatory with quad cane  DIAGNOSTIC FINDINGS:   05/06/22 Femoral X-ray Right femoral fracture repair as above. Alignment is near anatomic by the end of the study.  PATIENT SURVEYS:  LEFS 44/80  COGNITION: Overall cognitive status: Within functional limits for tasks assessed     SENSATION: WFL   MUSCLE LENGTH: Mild tightness on B hamstrings, hip flexors Moderate tightness on B piriformis and gastrocsoleus  POSTURE: rounded shoulders and forward head  PALPATION: No tenderness on B knees  LOWER EXTREMITY ROM:  Active ROM Right eval Left eval  Hip flexion [facility]  Hip extension    Hip abduction [facility]  Hip adduction [facility]  Hip internal rotation    Hip external rotation    Knee flexion 110 125  Knee extension [facility]  Ankle dorsiflexion [facility] WFL  Ankle plantarflexion [facility] WFL  Ankle inversion    Ankle eversion     (Blank rows = not tested)  LOWER EXTREMITY MMT:  MMT Right eval Left eval  Hip flexion 4- 4+  Hip extension    Hip abduction 3+ 4+  Hip adduction 4 4+  Hip internal rotation    Hip external rotation    Knee flexion 3+ 4+  Knee extension 4 4+  Ankle dorsiflexion 4+ 4+  Ankle plantarflexion 4+ 4+  Ankle inversion    Ankle eversion     (Blank rows = not tested)  LOWER EXTREMITY SPECIAL TESTS:  Hip special tests: Marcello Moores test: positive   FUNCTIONAL TESTS:  2 minute walk test: 169 ft  Good static/dynamic standing balance  GAIT: Distance walked: 169 Assistive device utilized: Quad cane small base Level of assistance: SBA Comments: slight to mild antalgic gait   TODAY'S TREATMENT:                                                                                                                              DATE:  Evaluation and patient education    PATIENT EDUCATION:  Education details: Educated on the goals and course of rehab  as well as falls risk reduction measures at home.  Person educated: Patient Education method: Explanation Education comprehension: verbalized understanding  HOME EXERCISE PROGRAM: None provided  to date  ASSESSMENT:  CLINICAL IMPRESSION: Patient is a 52 y.o. female who was seen today for physical therapy evaluation and treatment for s/p R femoral shaft fx. Patient is S/P R IM femoral nailing further defined by difficulty with walking, stair negotiation, and bending over due to pain, weakness, impaired proprioception and decreased soft tissue extensibility. Skilled PT is required to address the impairments and functional limitations listed below. Patient will benefit from LE strengthening, stretching, balance/proprioception exercises and gait training.   OBJECTIVE IMPAIRMENTS: Abnormal gait, decreased balance, difficulty walking, decreased ROM, decreased strength, impaired flexibility, and pain.   ACTIVITY LIMITATIONS: lifting, bending, standing, squatting, stairs, and transfers  PARTICIPATION LIMITATIONS: cleaning, laundry, and community activity  PERSONAL FACTORS: 1 comorbidity: breathing issues  are also affecting patient's functional outcome.   REHAB POTENTIAL: Good  CLINICAL DECISION MAKING: Stable/uncomplicated  EVALUATION COMPLEXITY: Low   GOALS: Goals reviewed with patient? Yes  SHORT TERM GOALS: Target date: 07/03/22 Pt will demonstrate indep in HEP to facilitate carry-over of skilled services and improve functional outcomes Baseline: Goal status: INITIAL  2.  Pt will demonstrate increase in knee flex ROM to 120 to facilitate ease in ambulation Baseline: 110 Goal status: INITIAL  3.  Pt will demonstrate increase in LE strength to 4/5 to facilitate ease and safety in ambulation Baseline: 4/5 Goal status: INITIAL  LONG TERM GOALS: Target date: 07/31/22  Pt will be able to walk independently without AD for > 200 ft Baseline: ambulatory with quad cane Goal status:  INITIAL  2.  Pt will be able to do stair negotiation (1 flight) without AD Baseline: uses a quad cane Goal status: INITIAL  3.  Pt will increase LEFS by at least 9 points in order to demonstrate significant improvement in lower extremity function.  Baseline: 44 Goal status: INITIAL  4.  Pt will increase 2MWT by at least 40 ft in order to demonstrate clinically significant improvement in community ambulation Baseline: 169 ft Goal status: INITIAL  PLAN:  PT FREQUENCY: 2x/week  PT DURATION: 8 weeks  PLANNED INTERVENTIONS: Therapeutic exercises, Therapeutic activity, Neuromuscular re-education, Balance training, Gait training, Patient/Family education, Self Care, Stair training, and Manual therapy  PLAN FOR NEXT SESSION: Provide HEP. May begin LE strengthening, flexibility, and balance exercises as well as gait training.   Harvie Heck. Angelia Hazell, PT, DPT, OCS Board-Certified Clinical Specialist in [place] # ([place]): [id number] T 06/05/2022, 10:08 AM

## 2022-06-06 DIAGNOSIS — Z419 Encounter for procedure for purposes other than remedying health state, unspecified: Secondary | ICD-10-CM | POA: Diagnosis not present

## 2022-06-07 DIAGNOSIS — Z856 Personal history of leukemia: Secondary | ICD-10-CM | POA: Diagnosis not present

## 2022-06-07 DIAGNOSIS — M810 Age-related osteoporosis without current pathological fracture: Secondary | ICD-10-CM | POA: Diagnosis not present

## 2022-06-07 DIAGNOSIS — Z79899 Other long term (current) drug therapy: Secondary | ICD-10-CM | POA: Diagnosis not present

## 2022-06-07 DIAGNOSIS — E039 Hypothyroidism, unspecified: Secondary | ICD-10-CM | POA: Diagnosis not present

## 2022-06-07 DIAGNOSIS — N910 Primary amenorrhea: Secondary | ICD-10-CM | POA: Diagnosis not present

## 2022-06-07 DIAGNOSIS — R7989 Other specified abnormal findings of blood chemistry: Secondary | ICD-10-CM | POA: Diagnosis not present

## 2022-06-07 DIAGNOSIS — Z923 Personal history of irradiation: Secondary | ICD-10-CM | POA: Diagnosis not present

## 2022-06-07 DIAGNOSIS — N920 Excessive and frequent menstruation with regular cycle: Secondary | ICD-10-CM | POA: Diagnosis not present

## 2022-06-07 DIAGNOSIS — M8000XS Age-related osteoporosis with current pathological fracture, unspecified site, sequela: Secondary | ICD-10-CM | POA: Diagnosis not present

## 2022-06-07 DIAGNOSIS — Z79890 Hormone replacement therapy (postmenopausal): Secondary | ICD-10-CM | POA: Diagnosis not present

## 2022-06-07 DIAGNOSIS — Z9481 Bone marrow transplant status: Secondary | ICD-10-CM | POA: Diagnosis not present

## 2022-06-07 DIAGNOSIS — Z780 Asymptomatic menopausal state: Secondary | ICD-10-CM | POA: Diagnosis not present

## 2022-06-10 ENCOUNTER — Ambulatory Visit (HOSPITAL_COMMUNITY): Payer: Medicaid Other | Attending: Student | Admitting: Physical Therapy

## 2022-06-10 DIAGNOSIS — R262 Difficulty in walking, not elsewhere classified: Secondary | ICD-10-CM | POA: Diagnosis not present

## 2022-06-10 NOTE — Therapy (Signed)
OUTPATIENT PHYSICAL THERAPY LOWER EXTREMITY TREATMENT   Patient Name: Latoya Bautista MRN: [id number] DOB:February 14, 1971, 52 y.o., female Today's Date: 06/10/2022  END OF SESSION:  PT End of Session - 06/10/22 0815     Visit Number 2    Number of Visits 16    Date for PT Re-Evaluation 07/31/22    Authorization Type N Medicaid [employer] (requested for 16 visits, please check)    Authorization Time Period 8 approved 1/31 to 08/04/22    Authorization - Visit Number 1    Authorization - Number of Visits 8    Progress Note Due on Visit 9    PT Start Time 0818    PT Stop Time 0858    PT Time Calculation (min) 40 min    Activity Tolerance Patient tolerated treatment well    Behavior During Therapy [facility] for tasks assessed/performed             Past Medical History:  Diagnosis Date   Anemia    Cancer ([place])    tongue cancer   H/O echocardiogram    a. 07/2017: echo showing EF of 55-60%, Grade 1 DD, and no significant valve abnormalities.    Hepatitis C    Hepatitis C 04/11/2011   Leukemia ([place])    Liver cirrhosis ([place]) 04/11/2011   Osteoporosis    Pulmonary fibrosis (HCC)    Seizures (HCC)    Shingles    Thrombocytopenia ([place]) 04/11/2011   Thyroid disease    Past Surgical History:  Procedure Laterality Date   BONE MARROW BIOPSY  05/07/2011   BONE MARROW TRANSPLANT     BUNIONECTOMY     CHOLECYSTECTOMY     FEMUR IM NAIL Right 05/06/2022   Procedure: INTRAMEDULLARY (IM) NAIL FEMORAL;  Surgeon: Shona Needles, MD;  Location: [place];  Service: Orthopedics;  Laterality: Right;   liver biopsie     TONGUE BIOPSY     tongue cancer     surgical removal of area   Patient Active Problem List   Diagnosis Date Noted   AKI (acute kidney injury) ([place]) 05/07/2022   Chronic hyponatremia 05/07/2022   Pathological fracture of right femur due to osteoporosis ([place]) 05/07/2022   Femur fracture, right ([place]) 05/05/2022   Malignant neoplasm of tongue, tip and lateral border ([place]) 05/30/2021    Bone marrow transplant status ([place]) 05/22/2021   ILD (interstitial lung disease) ([place]) 04/18/2021   Squamous cell carcinoma of lateral tongue ([place]) 12/12/2020   Melanoma in situ of skin of buttock ([place]) 08/27/2018   Vulvar lesion 06/09/2018   History of thrombocytopenia 12/11/2015   Well woman exam with routine gynecological exam 10/18/2013   Localization-related epilepsy ([place]) 01/01/2013   Partial epilepsy with impairment of consciousness ([place]) 12/15/2012   Postmenopausal bleeding DUE TO hormone tx. 10/08/2012   History of hepatitis C 04/11/2011   Liver cirrhosis ([place]) 04/11/2011   Thrombocytopenia ([place]) 04/11/2011    PCP: Allyn Kenner MD  REFERRING PROVIDER: Corinne Ports, PA-C  REFERRING DIAG: R femoral shaft fx  THERAPY DIAG:  Difficulty in walking involving lower leg joint  Rationale for Evaluation and Treatment: Rehabilitation  ONSET DATE: 06/05/21  SUBJECTIVE:   SUBJECTIVE STATEMENT:  Patient reports ongoing difficulty with walking and notes decreased gait speed. She says she tripped over her cane on Friday and fell, but she was able to catch herself. She denies injury other than her leg was sore, but it is not sore now. She thinks the cane got in the way.  Patient denies any pain at the moment (0/10). However, the R knee hurts at times at night (2/10 on NPRS). Patient reports that the R leg will give way at times. Patient is still having difficulty bending over. Condition started on 06/05/21, when patient missed the step on the stairs. Patient was taken to the hospital and X-ray was done which revealed R femoral fx. Patient then had surgery 05/06/22 (intramedullary nail femoral). Patient received PT in the hospital and was able to walk with a walker. Patient was then sent to [place] to continue PT for 19 days. Patient is now ambulatory with a quad cane and was sent to outpatient PT evaluation and management.  PERTINENT HISTORY: Osteoporosis, thrombocytopenia,  breathing issues PAIN:  Are you having pain? No  PRECAUTIONS: Fall  WEIGHT BEARING RESTRICTIONS: No  FALLS:  Has patient fallen in last 6 months? Yes. Number of falls 1  LIVING ENVIRONMENT: Lives with: lives with their family and lives with their spouse Lives in: House/apartment Stairs: Yes: Internal: 16 steps; can reach both and External: 3 steps; on left going up Has following equipment at home: Quad cane small base and Grab bars  OCCUPATION: on disability  PLOF: Independent  PATIENT GOALS: "to walk better"  NEXT MD VISIT: 06/25/22  OBJECTIVE:   Ambulatory with quad cane  DIAGNOSTIC FINDINGS:   05/06/22 Femoral X-ray Right femoral fracture repair as above. Alignment is near anatomic by the end of the study.  PATIENT SURVEYS:  LEFS 44/80  COGNITION: Overall cognitive status: Within functional limits for tasks assessed     SENSATION: WFL   MUSCLE LENGTH: Mild tightness on B hamstrings, hip flexors Moderate tightness on B piriformis and gastrocsoleus  POSTURE: rounded shoulders and forward head  PALPATION: No tenderness on B knees  LOWER EXTREMITY ROM:  Active ROM Right eval Left eval  Hip flexion [facility]  Hip extension    Hip abduction [facility]  Hip adduction [facility]  Hip internal rotation    Hip external rotation    Knee flexion 110 125  Knee extension [facility]  Ankle dorsiflexion [facility] WFL  Ankle plantarflexion [facility] WFL  Ankle inversion    Ankle eversion     (Blank rows = not tested)  LOWER EXTREMITY MMT:  MMT Right eval Left eval  Hip flexion 4- 4+  Hip extension    Hip abduction 3+ 4+  Hip adduction 4 4+  Hip internal rotation    Hip external rotation    Knee flexion 3+ 4+  Knee extension 4 4+  Ankle dorsiflexion 4+ 4+  Ankle plantarflexion 4+ 4+  Ankle inversion    Ankle eversion     (Blank rows = not tested)  LOWER EXTREMITY SPECIAL TESTS:  Hip special tests: Marcello Moores test: positive   FUNCTIONAL TESTS:  2 minute walk test: 169  ft  Good static/dynamic standing balance  GAIT: Distance walked: 169 Assistive device utilized: Quad cane small base Level of assistance: SBA Comments: slight to mild antalgic gait   TODAY'S TREATMENT:  DATE:   06/10/22 Quad set 10 x 5" SLR 2 x 10 Bridge 2 x 10 Sidelying hip abduction 2 x 10  LAQ 10 x 5"   HR 2 x 10 TR 2 x 10 Clinic amb with quad cane 226 feet    Evaluation and patient education    PATIENT EDUCATION:  Education details: Educated on the goals and course of rehab as well as falls risk reduction measures at home.  Person educated: Patient Education method: Explanation Education comprehension: verbalized understanding  HOME EXERCISE PROGRAM: Access Code: [email or web address] URL: [email or web address] Date: 06/10/2022 Prepared by: Josue Hector  Exercises - Supine Quad Set  - 2 x daily - 7 x weekly - 2 sets - 10 reps -  5 second hold - Active Straight Leg Raise with Quad Set  - 2 x daily - 7 x weekly - 2 sets - 10 reps - Supine Bridge  - 2 x daily - 7 x weekly - 2 sets - 10 reps - Sidelying Hip Abduction  - 2 x daily - 7 x weekly - 2 sets - 10 reps - Seated Long Arc Quad  - 2 x daily - 7 x weekly - 2 sets - 10 reps - 5 second hold - Heel Raises with Counter Support  - 2 x daily - 7 x weekly - 2 sets - 10 reps - Toe Raises with Counter Support  - 2 x daily - 7 x weekly - 2 sets - 10 reps  ASSESSMENT:  CLINICAL IMPRESSION: Patient tolerate session well today. Initiated there ex. Patient educated on proper form and function of all added activity today. Patient had difficulty maintaining proper leg position during sidelying hip abduction. Tends to compensate with hip flexion. Some improvement noted with verbal and tactile cueing. Also, patient requires verbal cues for pacing activity and slowing down as patient tends to speed  through exercises. Developed HEP and issued handout. Patient will continue to benefit from skilled therapy services to reduce remaining deficits and improve functional ability.    OBJECTIVE IMPAIRMENTS: Abnormal gait, decreased balance, difficulty walking, decreased ROM, decreased strength, impaired flexibility, and pain.   ACTIVITY LIMITATIONS: lifting, bending, standing, squatting, stairs, and transfers  PARTICIPATION LIMITATIONS: cleaning, laundry, and community activity  PERSONAL FACTORS: 1 comorbidity: breathing issues  are also affecting patient's functional outcome.   REHAB POTENTIAL: Good  CLINICAL DECISION MAKING: Stable/uncomplicated  EVALUATION COMPLEXITY: Low   GOALS: Goals reviewed with patient? Yes  SHORT TERM GOALS: Target date: 07/03/22 Pt will demonstrate indep in HEP to facilitate carry-over of skilled services and improve functional outcomes Baseline: Goal status: INITIAL  2.  Pt will demonstrate increase in knee flex ROM to 120 to facilitate ease in ambulation Baseline: 110 Goal status: INITIAL  3.  Pt will demonstrate increase in LE strength to 4/5 to facilitate ease and safety in ambulation Baseline: 4/5 Goal status: INITIAL  LONG TERM GOALS: Target date: 07/31/22  Pt will be able to walk independently without AD for > 200 ft Baseline: ambulatory with quad cane Goal status: INITIAL  2.  Pt will be able to do stair negotiation (1 flight) without AD Baseline: uses a quad cane Goal status: INITIAL  3.  Pt will increase LEFS by at least 9 points in order to demonstrate significant improvement in lower extremity function.  Baseline: 44 Goal status: INITIAL  4.  Pt will increase 2MWT by at least 40 ft in order to demonstrate clinically significant improvement in community ambulation Baseline: 169 ft Goal  status: INITIAL  PLAN:  PT FREQUENCY: 2x/week  PT DURATION: 8 weeks  PLANNED INTERVENTIONS: Therapeutic exercises, Therapeutic activity,  Neuromuscular re-education, Balance training, Gait training, Patient/Family education, Self Care, Stair training, and Manual therapy  PLAN FOR NEXT SESSION: Continue LE strengthening, flexibility, and balance exercises as well as gait training.   8:59 AM, 06/10/22 Josue Hector PT DPT  Physical Therapist with [facility]  [phone]

## 2022-06-13 ENCOUNTER — Encounter (HOSPITAL_COMMUNITY): Payer: Self-pay | Admitting: Physical Therapy

## 2022-06-13 ENCOUNTER — Ambulatory Visit (HOSPITAL_COMMUNITY): Payer: Medicaid Other | Admitting: Physical Therapy

## 2022-06-13 DIAGNOSIS — R262 Difficulty in walking, not elsewhere classified: Secondary | ICD-10-CM | POA: Diagnosis not present

## 2022-06-13 NOTE — Therapy (Signed)
OUTPATIENT PHYSICAL THERAPY LOWER EXTREMITY TREATMENT   Patient Name: Latoya Bautista MRN: [id number] DOB:Mar 16, 1971, 52 y.o., female Today's Date: 06/13/2022  END OF SESSION:  PT End of Session - 06/13/22 1430     Visit Number 3    Number of Visits 16    Date for PT Re-Evaluation 07/31/22    Authorization Type N Medicaid [employer]    Authorization Time Period 8 approved 1/31 to 08/04/22    Authorization - Visit Number 2    Authorization - Number of Visits 8    Progress Note Due on Visit 9    PT Start Time 9470    PT Stop Time 1512    PT Time Calculation (min) 39 min    Activity Tolerance Patient tolerated treatment well    Behavior During Therapy WFL for tasks assessed/performed             Past Medical History:  Diagnosis Date   Anemia    Cancer ([place])    tongue cancer   H/O echocardiogram    a. 07/2017: echo showing EF of 55-60%, Grade 1 DD, and no significant valve abnormalities.    Hepatitis C    Hepatitis C 04/11/2011   Leukemia ([place])    Liver cirrhosis ([place]) 04/11/2011   Osteoporosis    Pulmonary fibrosis ([facility])    Seizures ([facility])    Shingles    Thrombocytopenia ([place]) 04/11/2011   Thyroid disease    Past Surgical History:  Procedure Laterality Date   BONE MARROW BIOPSY  05/07/2011   BONE MARROW TRANSPLANT     BUNIONECTOMY     CHOLECYSTECTOMY     FEMUR IM NAIL Right 05/06/2022   Procedure: INTRAMEDULLARY (IM) NAIL FEMORAL;  Surgeon: Shona Needles, MD;  Location: [facility];  Service: Orthopedics;  Laterality: Right;   liver biopsie     TONGUE BIOPSY     tongue cancer     surgical removal of area   Patient Active Problem List   Diagnosis Date Noted   AKI (acute kidney injury) ([place]) 05/07/2022   Chronic hyponatremia 05/07/2022   Pathological fracture of right femur due to osteoporosis ([place]) 05/07/2022   Femur fracture, right ([place]) 05/05/2022   Malignant neoplasm of tongue, tip and lateral border ([place]) 05/30/2021   Bone marrow transplant status ([place])  05/22/2021   ILD (interstitial lung disease) ([place]) 04/18/2021   Squamous cell carcinoma of lateral tongue ([place]) 12/12/2020   Melanoma in situ of skin of buttock ([place]) 08/27/2018   Vulvar lesion 06/09/2018   History of thrombocytopenia 12/11/2015   Well woman exam with routine gynecological exam 10/18/2013   Localization-related epilepsy ([place]) 01/01/2013   Partial epilepsy with impairment of consciousness ([place]) 12/15/2012   Postmenopausal bleeding DUE TO hormone tx. 10/08/2012   History of hepatitis C 04/11/2011   Liver cirrhosis ([place]) 04/11/2011   Thrombocytopenia ([place]) 04/11/2011    PCP: Allyn Kenner MD  REFERRING PROVIDER: Corinne Ports, PA-C  REFERRING DIAG: R femoral shaft fx  THERAPY DIAG:  Difficulty in walking involving lower leg joint  Rationale for Evaluation and Treatment: Rehabilitation  ONSET DATE: 05/05/22  SUBJECTIVE:   SUBJECTIVE STATEMENT: Patient says she is doing well today. Hasn't really been having any pain. No new falls, but still working with cane.   Patient denies any pain at the moment (0/10). However, the R knee hurts at times at night (2/10 on NPRS). Patient reports that the R leg will give way at times. Patient is still having difficulty bending  over. Condition started on 05/05/22, when patient missed the step on the stairs. Patient was taken to the hospital and X-ray was done which revealed R femoral fx. Patient then had surgery 05/06/22 (intramedullary nail femoral). Patient received PT in the hospital and was able to walk with a walker. Patient was then sent to [place] to continue PT for 19 days. Patient is now ambulatory with a quad cane and was sent to outpatient PT evaluation and management.  PERTINENT HISTORY: Osteoporosis, thrombocytopenia, breathing issues PAIN:  Are you having pain? No  PRECAUTIONS: Fall  WEIGHT BEARING RESTRICTIONS: No  FALLS:  Has patient fallen in last 6 months? Yes. Number of falls 1  LIVING  ENVIRONMENT: Lives with: lives with their family and lives with their spouse Lives in: House/apartment Stairs: Yes: Internal: 16 steps; can reach both and External: 3 steps; on left going up Has following equipment at home: Quad cane small base and Grab bars  OCCUPATION: on disability  PLOF: Independent  PATIENT GOALS: "to walk better"  NEXT MD VISIT: 06/25/22  OBJECTIVE:   Ambulatory with quad cane  DIAGNOSTIC FINDINGS:   05/06/22 Femoral X-ray Right femoral fracture repair as above. Alignment is near anatomic by the end of the study.  PATIENT SURVEYS:  LEFS 44/80  COGNITION: Overall cognitive status: Within functional limits for tasks assessed     SENSATION: WFL   MUSCLE LENGTH: Mild tightness on B hamstrings, hip flexors Moderate tightness on B piriformis and gastrocsoleus  POSTURE: rounded shoulders and forward head  PALPATION: No tenderness on B knees  LOWER EXTREMITY ROM:  Active ROM Right eval Left eval  Hip flexion [facility]  Hip extension    Hip abduction [facility]  Hip adduction [facility]  Hip internal rotation    Hip external rotation    Knee flexion 110 125  Knee extension [facility]  Ankle dorsiflexion [facility] WFL  Ankle plantarflexion [facility] WFL  Ankle inversion    Ankle eversion     (Blank rows = not tested)  LOWER EXTREMITY MMT:  MMT Right eval Left eval  Hip flexion 4- 4+  Hip extension    Hip abduction 3+ 4+  Hip adduction 4 4+  Hip internal rotation    Hip external rotation    Knee flexion 3+ 4+  Knee extension 4 4+  Ankle dorsiflexion 4+ 4+  Ankle plantarflexion 4+ 4+  Ankle inversion    Ankle eversion     (Blank rows = not tested)  LOWER EXTREMITY SPECIAL TESTS:  Hip special tests: Marcello Moores test: positive   FUNCTIONAL TESTS:  2 minute walk test: 169 ft  Good static/dynamic standing balance  GAIT: Distance walked: 169 Assistive device utilized: Quad cane small base Level of assistance: SBA Comments: slight to mild antalgic  gait   TODAY'S TREATMENT:  DATE:  06/13/22 Quad set 10 x 5" SLR 2 x 10 Bridge 2 x 10 Sidelying hip abduction 2 x 10  FWD step up 6 inch x20 HHA x 2 Lateral step up 6 inch x 20   Hip abduction RTB 2 x 10 Hip extension RTB 2 x 10  Sit to stand with arms extended 2 x 10   Nustep Seat 4 lv 5 5 min   06/10/22 Quad set 10 x 5" SLR 2 x 10 Bridge 2 x 10 Sidelying hip abduction 2 x 10  LAQ 10 x 5"   HR 2 x 10 TR 2 x 10 Clinic amb with quad cane 226 feet    Evaluation and patient education    PATIENT EDUCATION:  Education details: Educated on the goals and course of rehab as well as falls risk reduction measures at home.  Person educated: Patient Education method: Explanation Education comprehension: verbalized understanding  HOME EXERCISE PROGRAM: Access Code: [email or web address] URL: [email or web address] Date: 06/10/2022 Prepared by: Josue Hector  Exercises - Supine Quad Set  - 2 x daily - 7 x weekly - 2 sets - 10 reps -  5 second hold - Active Straight Leg Raise with Quad Set  - 2 x daily - 7 x weekly - 2 sets - 10 reps - Supine Bridge  - 2 x daily - 7 x weekly - 2 sets - 10 reps - Sidelying Hip Abduction  - 2 x daily - 7 x weekly - 2 sets - 10 reps - Seated Long Arc Quad  - 2 x daily - 7 x weekly - 2 sets - 10 reps - 5 second hold - Heel Raises with Counter Support  - 2 x daily - 7 x weekly - 2 sets - 10 reps - Toe Raises with Counter Support  - 2 x daily - 7 x weekly - 2 sets - 10 reps  ASSESSMENT:  CLINICAL IMPRESSION: Patient progressing well. Tolerated there ex progressions with no increased complaints of pain. Patient does require minimal verbal cues for leg position during sidelying hip abduction, and for slowing exercise down for improved target muscle activation. Patient needing verbal cues and demo for proper body mechanics and  translation during sit to stand/ chair squats. Patient will continue to benefit from skilled therapy services to reduce remaining deficits and improve functional ability.     OBJECTIVE IMPAIRMENTS: Abnormal gait, decreased balance, difficulty walking, decreased ROM, decreased strength, impaired flexibility, and pain.   ACTIVITY LIMITATIONS: lifting, bending, standing, squatting, stairs, and transfers  PARTICIPATION LIMITATIONS: cleaning, laundry, and community activity  PERSONAL FACTORS: 1 comorbidity: breathing issues  are also affecting patient's functional outcome.   REHAB POTENTIAL: Good  CLINICAL DECISION MAKING: Stable/uncomplicated  EVALUATION COMPLEXITY: Low   GOALS: Goals reviewed with patient? Yes  SHORT TERM GOALS: Target date: 07/03/22 Pt will demonstrate indep in HEP to facilitate carry-over of skilled services and improve functional outcomes Baseline: Goal status: INITIAL  2.  Pt will demonstrate increase in knee flex ROM to 120 to facilitate ease in ambulation Baseline: 110 Goal status: INITIAL  3.  Pt will demonstrate increase in LE strength to 4/5 to facilitate ease and safety in ambulation Baseline: 4/5 Goal status: INITIAL  LONG TERM GOALS: Target date: 07/31/22  Pt will be able to walk independently without AD for > 200 ft Baseline: ambulatory with quad cane Goal status: INITIAL  2.  Pt will be able to do stair negotiation (1 flight) without AD Baseline: uses a quad  cane Goal status: INITIAL  3.  Pt will increase LEFS by at least 9 points in order to demonstrate significant improvement in lower extremity function.  Baseline: 44 Goal status: INITIAL  4.  Pt will increase 2MWT by at least 40 ft in order to demonstrate clinically significant improvement in community ambulation Baseline: 169 ft Goal status: INITIAL  PLAN:  PT FREQUENCY: 2x/week  PT DURATION: 8 weeks  PLANNED INTERVENTIONS: Therapeutic exercises, Therapeutic activity,  Neuromuscular re-education, Balance training, Gait training, Patient/Family education, Self Care, Stair training, and Manual therapy  PLAN FOR NEXT SESSION: Progress balance. Continue LE strengthening, flexibility, and balance exercises as well as gait training.   3:14 PM, 06/13/22 Josue Hector PT DPT  Physical Therapist with [facility]  [phone]

## 2022-06-18 DIAGNOSIS — C021 Malignant neoplasm of border of tongue: Secondary | ICD-10-CM | POA: Diagnosis not present

## 2022-06-18 DIAGNOSIS — R682 Dry mouth, unspecified: Secondary | ICD-10-CM | POA: Diagnosis not present

## 2022-06-19 ENCOUNTER — Ambulatory Visit (HOSPITAL_COMMUNITY): Payer: Medicaid Other

## 2022-06-19 DIAGNOSIS — R262 Difficulty in walking, not elsewhere classified: Secondary | ICD-10-CM

## 2022-06-19 NOTE — Therapy (Addendum)
OUTPATIENT PHYSICAL THERAPY LOWER EXTREMITY TREATMENT   Patient Name: Latoya Bautista MRN: [id number] DOB:[birth year], 52 y.o., female Today's Date: 06/19/2022  END OF SESSION:  PT End of Session - 06/19/22 0736     Visit Number 4    Number of Visits 16    Date for PT Re-Evaluation 07/31/22    Authorization Type N Medicaid [employer]    Authorization Time Period 8 approved 1/31 to 08/04/22    Authorization - Visit Number 3    Authorization - Number of Visits 8    Progress Note Due on Visit 9    PT Start Time 0735    PT Stop Time 0815    PT Time Calculation (min) 40 min    Activity Tolerance Patient tolerated treatment well              Past Medical History:  Diagnosis Date   Anemia    Cancer ([place])    tongue cancer   H/O echocardiogram    a. 07/2017: echo showing EF of 55-60%, Grade 1 DD, and no significant valve abnormalities.    Hepatitis C    Hepatitis C 04/11/2011   Leukemia ([place])    Liver cirrhosis ([place]) 04/11/2011   Osteoporosis    Pulmonary fibrosis ([facility])    Seizures (H[facility])    Shingles    Thrombocytopenia ([place]) 04/11/2011   Thyroid disease    Past Surgical History:  Procedure Laterality Date   BONE MARROW BIOPSY  05/07/2011   BONE MARROW TRANSPLANT     BUNIONECTOMY     CHOLECYSTECTOMY     FEMUR IM NAIL Right 05/06/2022   Procedure: INTRAMEDULLARY (IM) NAIL FEMORAL;  Surgeon: Shona Needles, MD;  Location: [facility];  Service: Orthopedics;  Laterality: Right;   liver biopsie     TONGUE BIOPSY     tongue cancer     surgical removal of area   Patient Active Problem List   Diagnosis Date Noted   AKI (acute kidney injury) ([place]) 05/07/2022   Chronic hyponatremia 05/07/2022   Pathological fracture of right femur due to osteoporosis ([place]) 05/07/2022   Femur fracture, right ([place]) 05/05/2022   Malignant neoplasm of tongue, tip and lateral border ([place]) 05/30/2021   Bone marrow transplant status ([place]) 05/22/2021   ILD (interstitial lung disease) ([place]) 04/18/2021    Squamous cell carcinoma of lateral tongue ([place]) 12/12/2020   Melanoma in situ of skin of buttock ([place]) 08/27/2018   Vulvar lesion 06/09/2018   History of thrombocytopenia 12/11/2015   Well woman exam with routine gynecological exam 10/18/2013   Localization-related epilepsy ([place]) 01/01/2013   Partial epilepsy with impairment of consciousness ([place]) 12/15/2012   Postmenopausal bleeding DUE TO hormone tx. 10/08/2012   History of hepatitis C 04/11/2011   Liver cirrhosis ([place]) 04/11/2011   Thrombocytopenia ([place]) 04/11/2011    PCP: Allyn Kenner MD  REFERRING PROVIDER: Corinne Ports, PA-C  REFERRING DIAG: R femoral shaft fx  THERAPY DIAG:  Difficulty in walking involving lower leg joint  Rationale for Evaluation and Treatment: Rehabilitation  ONSET DATE: 05/05/22  SUBJECTIVE:   SUBJECTIVE STATEMENT: Patient states that she walked around 4000-5000 steps yesterday and did fine. Patient denies any pain at the moment  Patient denies any pain at the moment (0/10). However, the R knee hurts at times at night (2/10 on NPRS). Patient reports that the R leg will give way at times. Patient is still having difficulty bending over. Condition started on 05/05/22, when patient missed the step on the  stairs. Patient was taken to the hospital and X-ray was done which revealed R femoral fx. Patient then had surgery 05/06/22 (intramedullary nail femoral). Patient received PT in the hospital and was able to walk with a walker. Patient was then sent to [place] to continue PT for 19 days. Patient is now ambulatory with a quad cane and was sent to outpatient PT evaluation and management.  PERTINENT HISTORY: Osteoporosis, thrombocytopenia, breathing issues PAIN:  Are you having pain? No  PRECAUTIONS: Fall  WEIGHT BEARING RESTRICTIONS: No  FALLS:  Has patient fallen in last 6 months? Yes. Number of falls 1  LIVING ENVIRONMENT: Lives with: lives with their family and lives with their  spouse Lives in: House/apartment Stairs: Yes: Internal: 16 steps; can reach both and External: 3 steps; on left going up Has following equipment at home: Quad cane small base and Grab bars  OCCUPATION: on disability  PLOF: Independent  PATIENT GOALS: "to walk better"  NEXT MD VISIT: 06/25/22  OBJECTIVE:   Ambulatory with quad cane  DIAGNOSTIC FINDINGS:   05/06/22 Femoral X-ray Right femoral fracture repair as above. Alignment is near anatomic by the end of the study.  PATIENT SURVEYS:  LEFS 44/80  COGNITION: Overall cognitive status: Within functional limits for tasks assessed     SENSATION: WFL   MUSCLE LENGTH: Mild tightness on B hamstrings, hip flexors Moderate tightness on B piriformis and gastrocsoleus  POSTURE: rounded shoulders and forward head  PALPATION: No tenderness on B knees  LOWER EXTREMITY ROM:  Active ROM Right eval Left eval  Hip flexion [facility]  Hip extension    Hip abduction [facility]  Hip adduction WF [facility]  Hip internal rotation    Hip external rotation    Knee flexion 110 125  Knee extension [facility]  Ankle dorsiflexion [facility] WFL  Ankle plantarflexion [facility] WFL  Ankle inversion    Ankle eversion     (Blank rows = not tested)  LOWER EXTREMITY MMT:  MMT Right eval Left eval  Hip flexion 4- 4+  Hip extension    Hip abduction 3+ 4+  Hip adduction 4 4+  Hip internal rotation    Hip external rotation    Knee flexion 3+ 4+  Knee extension 4 4+  Ankle dorsiflexion 4+ 4+  Ankle plantarflexion 4+ 4+  Ankle inversion    Ankle eversion     (Blank rows = not tested)  LOWER EXTREMITY SPECIAL TESTS:  Hip special tests: Marcello Moores test: positive   FUNCTIONAL TESTS:  2 minute walk test: 169 ft  Good static/dynamic standing balance  GAIT: Distance walked: 169 Assistive device utilized: Quad cane small base Level of assistance: SBA Comments: slight to mild antalgic gait   TODAY'S TREATMENT:                                                                                                                               DATE:  06/19/22 Recumbent stepper,  seat 4, level 1 x 5' Passive prone R quads stretch x 30" Prone R quads stretch with a strap x 30" x 2 Seated hamstring stretch x 30" x 3 on B Seated gastrocnemius stretch with a strap x 30" on B Gastrocnemius stretch on a slant board x 30"on B Standing x 10 x 2 x 2 lbs  Hip abd  Hip ext  Heel raises  [employer], eyes open, front/back, side/side, 10 x 2 Mini squats x 3" x 10    06/13/22 Quad set 10 x 5" SLR 2 x 10 Bridge 2 x 10 Sidelying hip abduction 2 x 10  FWD step up 6 inch x20 HHA x 2 Lateral step up 6 inch x 20   Hip abduction RTB 2 x 10 Hip extension RTB 2 x 10  Sit to stand with arms extended 2 x 10   Nustep Seat 4 lv 5 5 min   06/10/22 Quad set 10 x 5" SLR 2 x 10 Bridge 2 x 10 Sidelying hip abduction 2 x 10  LAQ 10 x 5"   HR 2 x 10 TR 2 x 10 Clinic amb with quad cane 226 feet    Evaluation and patient education    PATIENT EDUCATION:  Education details: updated HEP  Person educated: Patient Education method: Explanation Education comprehension: verbalized understanding  HOME EXERCISE PROGRAM: Access Code: [email or web address] URL: [email or web address]  06/19/22 - Seated Hamstring Stretch  - 2 x daily - 7 x weekly - 3 reps - 30 hold - Seated Calf Stretch with Strap  - 2 x daily - 7 x weekly - 3 reps - 30 hold - Mini Squat with Counter Support  - 2 x daily - 7 x weekly - 2 sets - 10 reps - 3 hold Date: 06/10/2022 Prepared by: Josue Hector Exercises - Supine Quad Set  - 2 x daily - 7 x weekly - 2 sets - 10 reps -  5 second hold - Active Straight Leg Raise with Quad Set  - 2 x daily - 7 x weekly - 2 sets - 10 reps - Supine Bridge  - 2 x daily - 7 x weekly - 2 sets - 10 reps - Sidelying Hip Abduction  - 2 x daily - 7 x weekly - 2 sets - 10 reps - Seated Long Arc Quad  - 2 x daily - 7 x weekly - 2 sets - 10 reps - 5 second  hold - Heel Raises with Counter Support  - 2 x daily - 7 x weekly - 2 sets - 10 reps - Toe Raises with Counter Support  - 2 x daily - 7 x weekly - 2 sets - 10 reps  ASSESSMENT:  CLINICAL IMPRESSION: Interventions today were geared towards LE flexibility and LE strengthening. Tolerated all activities without worsening of symptoms. Demonstrated appropriate levels of fatigue. Required mild amount of cueing to ensure correct execution of activity. Mild unsteadiness seen on wobble board due to impaired proprioception. To date, skilled PT is required to address the impairments and improve function.   OBJECTIVE IMPAIRMENTS: Abnormal gait, decreased balance, difficulty walking, decreased ROM, decreased strength, impaired flexibility, and pain.   ACTIVITY LIMITATIONS: lifting, bending, standing, squatting, stairs, and transfers  PARTICIPATION LIMITATIONS: cleaning, laundry, and community activity  PERSONAL FACTORS: 1 comorbidity: breathing issues  are also affecting patient's functional outcome.   REHAB POTENTIAL: Good  CLINICAL DECISION MAKING: Stable/uncomplicated  EVALUATION COMPLEXITY: Low   GOALS: Goals reviewed with patient? Yes  SHORT  TERM GOALS: Target date: 07/03/22 Pt will demonstrate indep in HEP to facilitate carry-over of skilled services and improve functional outcomes Baseline: Goal status: IN PROGRESS  2.  Pt will demonstrate increase in knee flex ROM to 120 to facilitate ease in ambulation Baseline: 110 Goal status: INITIAL  3.  Pt will demonstrate increase in LE strength to 4/5 to facilitate ease and safety in ambulation Baseline: 4/5 Goal status: INITIAL  LONG TERM GOALS: Target date: 07/31/22  Pt will be able to walk independently without AD for > 200 ft Baseline: ambulatory with quad cane Goal status: INITIAL  2.  Pt will be able to do stair negotiation (1 flight) without AD Baseline: uses a quad cane Goal status: INITIAL  3.  Pt will increase LEFS by at  least 9 points in order to demonstrate significant improvement in lower extremity function.  Baseline: 44 Goal status: INITIAL  4.  Pt will increase 2MWT by at least 40 ft in order to demonstrate clinically significant improvement in community ambulation Baseline: 169 ft Goal status: INITIAL  PLAN:  PT FREQUENCY: 2x/week  PT DURATION: 8 weeks  PLANNED INTERVENTIONS: Therapeutic exercises, Therapeutic activity, Neuromuscular re-education, Balance training, Gait training, Patient/Family education, Self Care, Stair training, and Manual therapy  PLAN FOR NEXT SESSION: Continue POC and may progress as tolerated with emphasis on LE strengthening, flexibility, and balance exercises as well as gait training.  Harvie Heck. Tommie Dejoseph, PT, DPT, OCS Board-Certified Clinical Specialist in [place] # ([place]): [id number] T 7:39 AM, 06/19/22

## 2022-06-21 ENCOUNTER — Encounter (HOSPITAL_COMMUNITY): Payer: Medicaid Other

## 2022-06-21 ENCOUNTER — Ambulatory Visit (HOSPITAL_COMMUNITY): Payer: Medicaid Other

## 2022-06-21 DIAGNOSIS — R262 Difficulty in walking, not elsewhere classified: Secondary | ICD-10-CM | POA: Diagnosis not present

## 2022-06-21 NOTE — Therapy (Signed)
OUTPATIENT PHYSICAL THERAPY LOWER EXTREMITY TREATMENT   Patient Name: Latoya Bautista MRN: [id number] DOB:October 28, 1970, 52 y.o., female Today's Date: 06/21/2022  END OF SESSION:  PT End of Session - 06/21/22 1355     Visit Number 5    Number of Visits 16    Date for PT Re-Evaluation 07/31/22    Authorization Type N Medicaid [employer]    Authorization Time Period 8 approved 1/31 to 08/04/22    Authorization - Visit Number 4    Authorization - Number of Visits 8    Progress Note Due on Visit 9    PT Start Time 1350    PT Stop Time 1430    PT Time Calculation (min) 40 min    Activity Tolerance Patient tolerated treatment well            Past Medical History:  Diagnosis Date   Anemia    Cancer ([place])    tongue cancer   H/O echocardiogram    a. 07/2017: echo showing EF of 55-60%, Grade 1 DD, and no significant valve abnormalities.    Hepatitis C    Hepatitis C 04/11/2011   Leukemia ([place])    Liver cirrhosis ([place]) 04/11/2011   Osteoporosis    Pulmonary fibrosis (H[facility])    Seizures (HCC)    Shingles    Thrombocytopenia ([place]) 04/11/2011   Thyroid disease    Past Surgical History:  Procedure Laterality Date   BONE MARROW BIOPSY  05/07/2011   BONE MARROW TRANSPLANT     BUNIONECTOMY     CHOLECYSTECTOMY     FEMUR IM NAIL Right 05/06/2022   Procedure: INTRAMEDULLARY (IM) NAIL FEMORAL;  Surgeon: Shona Needles, MD;  Location: [place];  Service: Orthopedics;  Laterality: Right;   liver biopsie     TONGUE BIOPSY     tongue cancer     surgical removal of area   Patient Active Problem List   Diagnosis Date Noted   AKI (acute kidney injury) ([place]) 05/07/2022   Chronic hyponatremia 05/07/2022   Pathological fracture of right femur due to osteoporosis ([place]) 05/07/2022   Femur fracture, right ([place]) 05/05/2022   Malignant neoplasm of tongue, tip and lateral border ([place]) 05/30/2021   Bone marrow transplant status ([place]) 05/22/2021   ILD (interstitial lung disease) ([place]) 04/18/2021    Squamous cell carcinoma of lateral tongue ([place]) 12/12/2020   Melanoma in situ of skin of buttock ([place]) 08/27/2018   Vulvar lesion 06/09/2018   History of thrombocytopenia 12/11/2015   Well woman exam with routine gynecological exam 10/18/2013   Localization-related epilepsy ([place]) 01/01/2013   Partial epilepsy with impairment of consciousness ([place]) 12/15/2012   Postmenopausal bleeding DUE TO hormone tx. 10/08/2012   History of hepatitis C 04/11/2011   Liver cirrhosis ([place]) 04/11/2011   Thrombocytopenia ([place]) 04/11/2011    PCP: Allyn Kenner MD  REFERRING PROVIDER: Corinne Ports, PA-C  REFERRING DIAG: R femoral shaft fx  THERAPY DIAG:  Difficulty in walking involving lower leg joint  Rationale for Evaluation and Treatment: Rehabilitation  ONSET DATE: 05/05/22  SUBJECTIVE:   SUBJECTIVE STATEMENT: Patient denies any pain. Patient that she was doing well after the last session.  Patient denies any pain at the moment (0/10). However, the R knee hurts at times at night (2/10 on NPRS). Patient reports that the R leg will give way at times. Patient is still having difficulty bending over. Condition started on 05/05/22, when patient missed the step on the stairs. Patient was taken to the hospital  and X-ray was done which revealed R femoral fx. Patient then had surgery 05/06/22 (intramedullary nail femoral). Patient received PT in the hospital and was able to walk with a walker. Patient was then sent to [place] to continue PT for 19 days. Patient is now ambulatory with a quad cane and was sent to outpatient PT evaluation and management.  PERTINENT HISTORY: Osteoporosis, thrombocytopenia, breathing issues PAIN:  Are you having pain? No  PRECAUTIONS: Fall  WEIGHT BEARING RESTRICTIONS: No  FALLS:  Has patient fallen in last 6 months? Yes. Number of falls 1  LIVING ENVIRONMENT: Lives with: lives with their family and lives with their spouse Lives in: House/apartment Stairs:  Yes: Internal: 16 steps; can reach both and External: 3 steps; on left going up Has following equipment at home: Quad cane small base and Grab bars  OCCUPATION: on disability  PLOF: Independent  PATIENT GOALS: "to walk better"  NEXT MD VISIT: 06/25/22  OBJECTIVE:   Ambulatory with quad cane  DIAGNOSTIC FINDINGS:   05/06/22 Femoral X-ray Right femoral fracture repair as above. Alignment is near anatomic by the end of the study.  PATIENT SURVEYS:  LEFS 44/80  COGNITION: Overall cognitive status: Within functional limits for tasks assessed     SENSATION: WFL   MUSCLE LENGTH: Mild tightness on B hamstrings, hip flexors Moderate tightness on B piriformis and gastrocsoleus  POSTURE: rounded shoulders and forward head  PALPATION: No tenderness on B knees  LOWER EXTREMITY ROM:  Active ROM Right eval Left eval  Hip flexion [facility]  Hip extension    Hip abduction [facility]  Hip adduction W[facility]  Hip internal rotation    Hip external rotation    Knee flexion 110 125  Knee extension [facility]  Ankle dorsiflexion [facility] WFL  Ankle plantarflexion [facility] WFL  Ankle inversion    Ankle eversion     (Blank rows = not tested)  LOWER EXTREMITY MMT:  MMT Right eval Left eval  Hip flexion 4- 4+  Hip extension    Hip abduction 3+ 4+  Hip adduction 4 4+  Hip internal rotation    Hip external rotation    Knee flexion 3+ 4+  Knee extension 4 4+  Ankle dorsiflexion 4+ 4+  Ankle plantarflexion 4+ 4+  Ankle inversion    Ankle eversion     (Blank rows = not tested)  LOWER EXTREMITY SPECIAL TESTS:  Hip special tests: Marcello Moores test: positive   FUNCTIONAL TESTS:  2 minute walk test: 169 ft  Good static/dynamic standing balance  GAIT: Distance walked: 169 Assistive device utilized: Quad cane small base Level of assistance: SBA Comments: slight to mild antalgic gait   TODAY'S TREATMENT:                                                                                                                               DATE:  06/21/22 (on bilateral) Recumbent stepper, seat 4, level 2 x  5' Passive prone R quads stretch x 30" Seated hamstring stretch x 30" x 3 on B Gastrocnemius stretch on a slant board x 30"on B LAQ x 3" x 10 x 2 x 3 lbs Standing x 10 x 2 x 3 lbs  Hip abd  Hip ext  Heel raises  Hamstring curls  Marches Mini squats x 3" x 10  x 2 Forward step ups, 4" box x 10  x 2 on each Tandem stance, firm surface, eyes open x 30" x 2 sets on each Single leg stance x 3" x 10 on each Walking on level surface, CGA, no AD x 40 ft x 1 round Sidestepping, HHA, x 40 ft x 1 round  06/19/22 Recumbent stepper, seat 4, level 1 x 5' Passive prone R quads stretch x 30" Prone R quads stretch with a strap x 30" x 2 Seated hamstring stretch x 30" x 3 on B Seated gastrocnemius stretch with a strap x 30" on B Gastrocnemius stretch on a slant board x 30"on B Standing x 10 x 2 x 2 lbs  Hip abd  Hip ext  Heel raises  Marches Mini squats x 3" x 10  06/13/22 Quad set 10 x 5" SLR 2 x 10 Bridge 2 x 10 Sidelying hip abduction 2 x 10  FWD step up 6 inch x20 HHA x 2 Lateral step up 6 inch x 20   Hip abduction RTB 2 x 10 Hip extension RTB 2 x 10  Sit to stand with arms extended 2 x 10   Nustep Seat 4 lv 5 5 min   06/10/22 Quad set 10 x 5" SLR 2 x 10 Bridge 2 x 10 Sidelying hip abduction 2 x 10  LAQ 10 x 5"   HR 2 x 10 TR 2 x 10 Clinic amb with quad cane 226 feet    Evaluation and patient education    PATIENT EDUCATION:  Education details: updated HEP  Person educated: Patient Education method: Explanation Education comprehension: verbalized understanding  HOME EXERCISE PROGRAM: Access Code: [email or web address] URL: [email or web address]  06/19/22 - Seated Hamstring Stretch  - 2 x daily - 7 x weekly - 3 reps - 30 hold - Seated Calf Stretch with Strap  - 2 x daily - 7 x weekly - 3 reps - 30 hold - Mini Squat with Counter Support  - 2 x daily - 7 x  weekly - 2 sets - 10 reps - 3 hold Date: 06/10/2022 Prepared by: Josue Hector Exercises - Supine Quad Set  - 2 x daily - 7 x weekly - 2 sets - 10 reps -  5 second hold - Active Straight Leg Raise with Quad Set  - 2 x daily - 7 x weekly - 2 sets - 10 reps - Supine Bridge  - 2 x daily - 7 x weekly - 2 sets - 10 reps - Sidelying Hip Abduction  - 2 x daily - 7 x weekly - 2 sets - 10 reps - Seated Long Arc Quad  - 2 x daily - 7 x weekly - 2 sets - 10 reps - 5 second hold - Heel Raises with Counter Support  - 2 x daily - 7 x weekly - 2 sets - 10 reps - Toe Raises with Counter Support  - 2 x daily - 7 x weekly - 2 sets - 10 reps  ASSESSMENT:  CLINICAL IMPRESSION: Interventions today were geared towards LE flexibility and LE strengthening. Tolerated all activities without pain. Moderate  unsteadiness seen during tandem stance and single leg stance due to impaired proprioception. Little to no unsteadiness seen in gait training without a walker. Demonstrated appropriate levels of fatigue. Required mild amount of cueing to ensure correct execution of activity. To date, skilled PT is required to address the impairments and improve function.   OBJECTIVE IMPAIRMENTS: Abnormal gait, decreased balance, difficulty walking, decreased ROM, decreased strength, impaired flexibility, and pain.   ACTIVITY LIMITATIONS: lifting, bending, standing, squatting, stairs, and transfers  PARTICIPATION LIMITATIONS: cleaning, laundry, and community activity  PERSONAL FACTORS: 1 comorbidity: breathing issues  are also affecting patient's functional outcome.   REHAB POTENTIAL: Good  CLINICAL DECISION MAKING: Stable/uncomplicated  EVALUATION COMPLEXITY: Low   GOALS: Goals reviewed with patient? Yes  SHORT TERM GOALS: Target date: 07/03/22 Pt will demonstrate indep in HEP to facilitate carry-over of skilled services and improve functional outcomes Baseline: Goal status: IN PROGRESS  2.  Pt will demonstrate  increase in knee flex ROM to 120 to facilitate ease in ambulation Baseline: 110 Goal status: INITIAL  3.  Pt will demonstrate increase in LE strength to 4/5 to facilitate ease and safety in ambulation Baseline: 4/5 Goal status: INITIAL  LONG TERM GOALS: Target date: 07/31/22  Pt will be able to walk independently without AD for > 200 ft Baseline: ambulatory with quad cane Goal status: INITIAL  2.  Pt will be able to do stair negotiation (1 flight) without AD Baseline: uses a quad cane Goal status: INITIAL  3.  Pt will increase LEFS by at least 9 points in order to demonstrate significant improvement in lower extremity function.  Baseline: 44 Goal status: INITIAL  4.  Pt will increase 2MWT by at least 40 ft in order to demonstrate clinically significant improvement in community ambulation Baseline: 169 ft Goal status: INITIAL  PLAN:  PT FREQUENCY: 2x/week  PT DURATION: 8 weeks  PLANNED INTERVENTIONS: Therapeutic exercises, Therapeutic activity, Neuromuscular re-education, Balance training, Gait training, Patient/Family education, Self Care, Stair training, and Manual therapy  PLAN FOR NEXT SESSION: Continue POC and may progress as tolerated with emphasis on LE strengthening, flexibility, and balance exercises as well as gait training.  Harvie Heck. Dailan Pfalzgraf, PT, DPT, OCS Board-Certified Clinical Specialist in [place] # ([place]): [id number] T 1:56 PM, 06/21/22

## 2022-06-25 ENCOUNTER — Ambulatory Visit (HOSPITAL_COMMUNITY): Payer: Medicaid Other | Admitting: Physical Therapy

## 2022-06-25 DIAGNOSIS — R262 Difficulty in walking, not elsewhere classified: Secondary | ICD-10-CM | POA: Diagnosis not present

## 2022-06-25 DIAGNOSIS — S72301D Unspecified fracture of shaft of right femur, subsequent encounter for closed fracture with routine healing: Secondary | ICD-10-CM | POA: Diagnosis not present

## 2022-06-25 NOTE — Therapy (Signed)
OUTPATIENT PHYSICAL THERAPY LOWER EXTREMITY TREATMENT   Patient Name: Latoya Bautista MRN: [id number] DOB:01/31/1971, 52 y.o., female Today's Date: 06/25/2022  END OF SESSION:  PT End of Session - 06/25/22 1436     Visit Number 6    Number of Visits 16    Date for PT Re-Evaluation 07/31/22    Authorization Type N Medicaid [employer]    Authorization Time Period 8 approved 1/31 to 08/04/22    Authorization - Visit Number 5    Authorization - Number of Visits 8    Progress Note Due on Visit 9    PT Start Time [id number]    PT Stop Time 1513    PT Time Calculation (min) 40 min    Activity Tolerance Patient tolerated treatment well    Behavior During Therapy [facility] for tasks assessed/performed            Past Medical History:  Diagnosis Date   Anemia    Cancer ([place])    tongue cancer   H/O echocardiogram    a. 07/2017: echo showing EF of 55-60%, Grade 1 DD, and no significant valve abnormalities.    Hepatitis C    Hepatitis C 04/11/2011   Leukemia ([place])    Liver cirrhosis ([place]) 04/11/2011   Osteoporosis    Pulmonary fibrosis (HCC)    Seizures (HCC)    Shingles    Thrombocytopenia ([place]) 04/11/2011   Thyroid disease    Past Surgical History:  Procedure Laterality Date   BONE MARROW BIOPSY  05/07/2011   BONE MARROW TRANSPLANT     BUNIONECTOMY     CHOLECYSTECTOMY     FEMUR IM NAIL Right 05/06/2022   Procedure: INTRAMEDULLARY (IM) NAIL FEMORAL;  Surgeon: Shona Needles, MD;  Location: [place];  Service: Orthopedics;  Laterality: Right;   liver biopsie     TONGUE BIOPSY     tongue cancer     surgical removal of area   Patient Active Problem List   Diagnosis Date Noted   AKI (acute kidney injury) ([place]) 05/07/2022   Chronic hyponatremia 05/07/2022   Pathological fracture of right femur due to osteoporosis ([place]) 05/07/2022   Femur fracture, right ([place]) 05/05/2022   Malignant neoplasm of tongue, tip and lateral border ([place]) 05/30/2021   Bone marrow transplant status ([place])  05/22/2021   ILD (interstitial lung disease) ([place]) 04/18/2021   Squamous cell carcinoma of lateral tongue ([place]) 12/12/2020   Melanoma in situ of skin of buttock ([place]) 08/27/2018   Vulvar lesion 06/09/2018   History of thrombocytopenia 12/11/2015   Well woman exam with routine gynecological exam 10/18/2013   Localization-related epilepsy ([place]) 01/01/2013   Partial epilepsy with impairment of consciousness ([place]) 12/15/2012   Postmenopausal bleeding DUE TO hormone tx. 10/08/2012   History of hepatitis C 04/11/2011   Liver cirrhosis ([place]) 04/11/2011   Thrombocytopenia ([place]) 04/11/2011    PCP: Allyn Kenner MD  REFERRING PROVIDER: Corinne Ports, PA-C  REFERRING DIAG: R femoral shaft fx  THERAPY DIAG:  Difficulty in walking involving lower leg joint  Rationale for Evaluation and Treatment: Rehabilitation  ONSET DATE: 05/05/22  SUBJECTIVE:   SUBJECTIVE STATEMENT: Met with surgeon and had good report. Feeling good. No pain. She wants to work on coming off of cane.   Patient denies any pain at the moment (0/10). However, the R knee hurts at times at night (2/10 on NPRS). Patient reports that the R leg will give way at times. Patient is still having difficulty bending over. Condition  started on 05/05/22, when patient missed the step on the stairs. Patient was taken to the hospital and X-ray was done which revealed R femoral fx. Patient then had surgery 05/06/22 (intramedullary nail femoral). Patient received PT in the hospital and was able to walk with a walker. Patient was then sent to [place] to continue PT for 19 days. Patient is now ambulatory with a quad cane and was sent to outpatient PT evaluation and management.  PERTINENT HISTORY: Osteoporosis, thrombocytopenia, breathing issues PAIN:  Are you having pain? No  PRECAUTIONS: Fall  WEIGHT BEARING RESTRICTIONS: No  FALLS:  Has patient fallen in last 6 months? Yes. Number of falls 1  LIVING ENVIRONMENT: Lives with:  lives with their family and lives with their spouse Lives in: House/apartment Stairs: Yes: Internal: 16 steps; can reach both and External: 3 steps; on left going up Has following equipment at home: Quad cane small base and Grab bars  OCCUPATION: on disability  PLOF: Independent  PATIENT GOALS: "to walk better"  NEXT MD VISIT: 06/25/22  OBJECTIVE:   Ambulatory with quad cane  DIAGNOSTIC FINDINGS:   05/06/22 Femoral X-ray Right femoral fracture repair as above. Alignment is near anatomic by the end of the study.  PATIENT SURVEYS:  LEFS 44/80  COGNITION: Overall cognitive status: Within functional limits for tasks assessed     SENSATION: WFL   MUSCLE LENGTH: Mild tightness on B hamstrings, hip flexors Moderate tightness on B piriformis and gastrocsoleus  POSTURE: rounded shoulders and forward head  PALPATION: No tenderness on B knees  LOWER EXTREMITY ROM:  Active ROM Right eval Left eval  Hip flexion [facility]  Hip extension    Hip abduction [facility]  Hip adduction WF [facility]  Hip internal rotation    Hip external rotation    Knee flexion 110 125  Knee extension [facility]  Ankle dorsiflexion [facility] WFL  Ankle plantarflexion [facility] WFL  Ankle inversion    Ankle eversion     (Blank rows = not tested)  LOWER EXTREMITY MMT:  MMT Right eval Left eval  Hip flexion 4- 4+  Hip extension    Hip abduction 3+ 4+  Hip adduction 4 4+  Hip internal rotation    Hip external rotation    Knee flexion 3+ 4+  Knee extension 4 4+  Ankle dorsiflexion 4+ 4+  Ankle plantarflexion 4+ 4+  Ankle inversion    Ankle eversion     (Blank rows = not tested)  LOWER EXTREMITY SPECIAL TESTS:  Hip special tests: Marcello Moores test: positive   FUNCTIONAL TESTS:  2 minute walk test: 169 ft  Good static/dynamic standing balance  GAIT: Distance walked: 169 Assistive device utilized: Quad cane small base Level of assistance: SBA Comments: slight to mild antalgic gait   TODAY'S TREATMENT:  DATE:  06/25/22 Rec bike lv 4 5 min  6 inch power up x 10 (difficulty to hold position)  Standing hip abduction GTB 3 x10 Standing hip extension GTB 3 x 10  SLS 3 x10"   Stairs 4 inch 3 RT reciprocal single hand rail  Sidestepping 3RT  Tandem gait 2RT   06/21/22 (on bilateral) Recumbent stepper, seat 4, level 2 x 5' Passive prone R quads stretch x 30" Seated hamstring stretch x 30" x 3 on B Gastrocnemius stretch on a slant board x 30"on B LAQ x 3" x 10 x 2 x 3 lbs Standing x 10 x 2 x 3 lbs  Hip abd  Hip ext  Heel raises  Hamstring curls  Marches Mini squats x 3" x 10  x 2 Forward step ups, 4" box x 10  x 2 on each Tandem stance, firm surface, eyes open x 30" x 2 sets on each Single leg stance x 3" x 10 on each Walking on level surface, CGA, no AD x 40 ft x 1 round Sidestepping, HHA, x 40 ft x 1 round  06/19/22 Recumbent stepper, seat 4, level 1 x 5' Passive prone R quads stretch x 30" Prone R quads stretch with a strap x 30" x 2 Seated hamstring stretch x 30" x 3 on B Seated gastrocnemius stretch with a strap x 30" on B Gastrocnemius stretch on a slant board x 30"on B Standing x 10 x 2 x 2 lbs  Hip abd  Hip ext  Heel raises  Marches Mini squats x 3" x 10   PATIENT EDUCATION:  Education details: updated HEP  Person educated: Patient Education method: Explanation Education comprehension: verbalized understanding  HOME EXERCISE PROGRAM: Access Code: [email or web address] URL: [email or web address]  06/25/22 - Tandem Stance with Support  - 1-2 x daily - 7 x weekly - 1 sets - 3 reps - 30 second hold - Single Leg Stance with Support  - 1-2 x daily - 7 x weekly - 1 sets - 3 reps - 10-15 second hold - Hip Abduction with Resistance Loop  - 1-2 x daily - 7 x weekly - 2 sets - 10 reps - Hip Extension with Resistance Loop  - 1-2 x daily - 7 x weekly  - 2 sets - 10 reps  06/19/22 - Seated Hamstring Stretch  - 2 x daily - 7 x weekly - 3 reps - 30 hold - Seated Calf Stretch with Strap  - 2 x daily - 7 x weekly - 3 reps - 30 hold - Mini Squat with Counter Support  - 2 x daily - 7 x weekly - 2 sets - 10 reps - 3 hold Date: 06/10/2022 Prepared by: Josue Hector Exercises - Supine Quad Set  - 2 x daily - 7 x weekly - 2 sets - 10 reps -  5 second hold - Active Straight Leg Raise with Quad Set  - 2 x daily - 7 x weekly - 2 sets - 10 reps - Supine Bridge  - 2 x daily - 7 x weekly - 2 sets - 10 reps - Sidelying Hip Abduction  - 2 x daily - 7 x weekly - 2 sets - 10 reps - Seated Long Arc Quad  - 2 x daily - 7 x weekly - 2 sets - 10 reps - 5 second hold - Heel Raises with Counter Support  - 2 x daily - 7 x weekly - 2 sets - 10 reps - Toe Raises with  Counter Support  - 2 x daily - 7 x weekly - 2 sets - 10 reps  ASSESSMENT:  CLINICAL IMPRESSION: Patient progressing well. Focused on balance and improved gait mechanics. Patient challenge with single limb stance due to ongoing weakness and RLE fatigue. No increased pain during today's session. Improved mechanics seen ambulating stairs. Patient will continue to benefit from skilled therapy services to reduce remaining deficits and improve functional ability.    OBJECTIVE IMPAIRMENTS: Abnormal gait, decreased balance, difficulty walking, decreased ROM, decreased strength, impaired flexibility, and pain.   ACTIVITY LIMITATIONS: lifting, bending, standing, squatting, stairs, and transfers  PARTICIPATION LIMITATIONS: cleaning, laundry, and community activity  PERSONAL FACTORS: 1 comorbidity: breathing issues  are also affecting patient's functional outcome.   REHAB POTENTIAL: Good  CLINICAL DECISION MAKING: Stable/uncomplicated  EVALUATION COMPLEXITY: Low   GOALS: Goals reviewed with patient? Yes  SHORT TERM GOALS: Target date: 07/03/22 Pt will demonstrate indep in HEP to facilitate carry-over  of skilled services and improve functional outcomes Baseline: Goal status: IN PROGRESS  2.  Pt will demonstrate increase in knee flex ROM to 120 to facilitate ease in ambulation Baseline: 110 Goal status: INITIAL  3.  Pt will demonstrate increase in LE strength to 4/5 to facilitate ease and safety in ambulation Baseline: 4/5 Goal status: INITIAL  LONG TERM GOALS: Target date: 07/31/22  Pt will be able to walk independently without AD for > 200 ft Baseline: ambulatory with quad cane Goal status: INITIAL  2.  Pt will be able to do stair negotiation (1 flight) without AD Baseline: uses a quad cane Goal status: INITIAL  3.  Pt will increase LEFS by at least 9 points in order to demonstrate significant improvement in lower extremity function.  Baseline: 44 Goal status: INITIAL  4.  Pt will increase 2MWT by at least 40 ft in order to demonstrate clinically significant improvement in community ambulation Baseline: 169 ft Goal status: INITIAL  PLAN:  PT FREQUENCY: 2x/week  PT DURATION: 8 weeks  PLANNED INTERVENTIONS: Therapeutic exercises, Therapeutic activity, Neuromuscular re-education, Balance training, Gait training, Patient/Family education, Self Care, Stair training, and Manual therapy  PLAN FOR NEXT SESSION: Continue POC and may progress as tolerated with emphasis on LE strengthening, flexibility, and balance exercises as well as gait training.  3:12 PM, 06/25/22 Josue Hector PT DPT  Physical Therapist with [facility]  [phone]

## 2022-06-27 ENCOUNTER — Ambulatory Visit (INDEPENDENT_AMBULATORY_CARE_PROVIDER_SITE_OTHER): Payer: Medicaid Other | Admitting: Podiatry

## 2022-06-27 ENCOUNTER — Ambulatory Visit (HOSPITAL_COMMUNITY): Payer: Medicaid Other

## 2022-06-27 DIAGNOSIS — L600 Ingrowing nail: Secondary | ICD-10-CM | POA: Diagnosis not present

## 2022-06-27 DIAGNOSIS — R262 Difficulty in walking, not elsewhere classified: Secondary | ICD-10-CM

## 2022-06-27 DIAGNOSIS — M898X9 Other specified disorders of bone, unspecified site: Secondary | ICD-10-CM | POA: Diagnosis not present

## 2022-06-27 NOTE — Patient Instructions (Signed)
Pre-Operative Instructions  Congratulations, you have decided to take an important step to improving your quality of life.  You can be assured that the doctors of [facility] will be with you every step of the way.  Plan to be at the surgery center/hospital at least 1 (one) hour prior to your scheduled time unless otherwise directed by the surgical center/hospital staff.  You must have a responsible adult accompany you, remain during the surgery and drive you home.  Make sure you have directions to the surgical center/hospital and know how to get there on time. For hospital based surgery you will need to obtain a history and physical form from your family physician within 1 month prior to the date of surgery- we will give you a form for you primary physician.  We make every effort to accommodate the date you request for surgery.  There are however, times where surgery dates or times have to be moved.  We will contact you as soon as possible if a change in schedule is required.   No Aspirin/Ibuprofen for one week before surgery.  If you are on aspirin, any non-steroidal anti-inflammatory medications (Mobic, Aleve, Ibuprofen) you should stop taking it 7 days prior to your surgery.  You make take Tylenol  For pain prior to surgery.  Medications- If you are taking daily heart and blood pressure medications, seizure, reflux, allergy, asthma, anxiety, pain or diabetes medications, make sure the surgery center/hospital is aware before the day of surgery so they may notify you which medications to take or avoid the day of surgery. No food or drink after midnight the night before surgery unless directed otherwise by surgical center/hospital staff. No alcoholic beverages 24 hours prior to surgery.  No smoking 24 hours prior to or 24 hours after surgery. Wear loose pants or shorts- loose enough to fit over bandages, boots, and casts. No slip on shoes, sneakers are best. Bring your boot with you to the  surgery center/hospital.  Also bring crutches or a walker if your physician has prescribed it for you.  If you do not have this equipment, it will be provided for you after surgery. If you have not been contracted by the surgery center/hospital by the day before your surgery, call to confirm the date and time of your surgery. Leave-time from work may vary depending on the type of surgery you have.  Appropriate arrangements should be made prior to surgery with your employer. Prescriptions will be provided immediately following surgery by your doctor.  Have these filled as soon as possible after surgery and take the medication as directed. Remove nail polish on the operative foot. Wash the night before surgery.  The night before surgery wash the foot and leg well with the antibacterial soap provided and water paying special attention to beneath the toenails and in between the toes.  Rinse thoroughly with water and dry well with a towel.  Perform this wash unless told not to do so by your physician.  Enclosed: 1 Ice pack (please put in freezer the night before surgery)   1 Hibiclens skin cleaner   Pre-op Instructions  If you have any questions regarding the instructions, do not hesitate to call our office at any point during this process.   [place]: [address] [place], [place] [address] [phone]  [place]: [address] [place], [place] [address] [phone]  Dr. Dereonna Lensing, DPM  

## 2022-06-27 NOTE — Therapy (Signed)
OUTPATIENT PHYSICAL THERAPY LOWER EXTREMITY TREATMENT   Patient Name: Latoya Bautista MRN: [id number] DOB:09/04/1970, 52 y.o., female Today's Date: 06/27/2022  END OF SESSION:  PT End of Session - 06/27/22 1629     Visit Number 7    Number of Visits 16    Date for PT Re-Evaluation 07/31/22    Authorization Type N Medicaid [employer]    Authorization Time Period 8 approved 1/31 to 08/04/22    Authorization - Visit Number 6    Authorization - Number of Visits 8    Progress Note Due on Visit 9    PT Start Time 1525    PT Stop Time 1610    PT Time Calculation (min) 45 min    Equipment Utilized During Treatment Gait belt    Activity Tolerance Patient tolerated treatment well    Behavior During Therapy WFL for tasks assessed/performed            Past Medical History:  Diagnosis Date   Anemia    Cancer ([place])    tongue cancer   H/O echocardiogram    a. 07/2017: echo showing EF of 55-60%, Grade 1 DD, and no significant valve abnormalities.    Hepatitis C    Hepatitis C 04/11/2011   Leukemia ([place])    Liver cirrhosis ([place]) 04/11/2011   Osteoporosis    Pulmonary fibrosis (H[facility])    Seizures (H[facility])    Shingles    Thrombocytopenia ([place]) 04/11/2011   Thyroid disease    Past Surgical History:  Procedure Laterality Date   BONE MARROW BIOPSY  05/07/2011   BONE MARROW TRANSPLANT     BUNIONECTOMY     CHOLECYSTECTOMY     FEMUR IM NAIL Right 05/06/2022   Procedure: INTRAMEDULLARY (IM) NAIL FEMORAL;  Surgeon: Shona Needles, MD;  Location: [place];  Service: Orthopedics;  Laterality: Right;   liver biopsie     TONGUE BIOPSY     tongue cancer     surgical removal of area   Patient Active Problem List   Diagnosis Date Noted   AKI (acute kidney injury) ([place]) 05/07/2022   Chronic hyponatremia 05/07/2022   Pathological fracture of right femur due to osteoporosis ([place]) 05/07/2022   Femur fracture, right ([place]) 05/05/2022   Malignant neoplasm of tongue, tip and lateral border ([place])  05/30/2021   Bone marrow transplant status ([place]) 05/22/2021   ILD (interstitial lung disease) ([place]) 04/18/2021   Squamous cell carcinoma of lateral tongue ([place]) 12/12/2020   Melanoma in situ of skin of buttock ([place]) 08/27/2018   Vulvar lesion 06/09/2018   History of thrombocytopenia 12/11/2015   Well woman exam with routine gynecological exam 10/18/2013   Localization-related epilepsy ([place]) 01/01/2013   Partial epilepsy with impairment of consciousness ([place]) 12/15/2012   Postmenopausal bleeding DUE TO hormone tx. 10/08/2012   History of hepatitis C 04/11/2011   Liver cirrhosis ([place]) 04/11/2011   Thrombocytopenia ([place]) 04/11/2011    PCP: Allyn Kenner MD  REFERRING PROVIDER: Corinne Ports, PA-C  REFERRING DIAG: R femoral shaft fx  THERAPY DIAG:  Difficulty in walking involving lower leg joint  Rationale for Evaluation and Treatment: Rehabilitation  ONSET DATE: 05/05/22  SUBJECTIVE:   SUBJECTIVE STATEMENT: Patient states that she's doing well and denies pain. Claims that she has been walking at home without a cane and has no difficulty.    Patient denies any pain at the moment (0/10). However, the R knee hurts at times at night (2/10 on NPRS). Patient reports that the R  leg will give way at times. Patient is still having difficulty bending over. Condition started on 05/05/22, when patient missed the step on the stairs. Patient was taken to the hospital and X-ray was done which revealed R femoral fx. Patient then had surgery 05/06/22 (intramedullary nail femoral). Patient received PT in the hospital and was able to walk with a walker. Patient was then sent to [place] to continue PT for 19 days. Patient is now ambulatory with a quad cane and was sent to outpatient PT evaluation and management.  PERTINENT HISTORY: Osteoporosis, thrombocytopenia, breathing issues PAIN:  Are you having pain? No  PRECAUTIONS: Fall  WEIGHT BEARING RESTRICTIONS: No  FALLS:  Has patient  fallen in last 6 months? Yes. Number of falls 1  LIVING ENVIRONMENT: Lives with: lives with their family and lives with their spouse Lives in: House/apartment Stairs: Yes: Internal: 16 steps; can reach both and External: 3 steps; on left going up Has following equipment at home: Quad cane small base and Grab bars  OCCUPATION: on disability  PLOF: Independent  PATIENT GOALS: "to walk better"  NEXT MD VISIT: 06/25/22  OBJECTIVE:   Ambulatory with quad cane  DIAGNOSTIC FINDINGS:   05/06/22 Femoral X-ray Right femoral fracture repair as above. Alignment is near anatomic by the end of the study.  PATIENT SURVEYS:  LEFS 44/80  COGNITION: Overall cognitive status: Within functional limits for tasks assessed     SENSATION: WFL   MUSCLE LENGTH: Mild tightness on B hamstrings, hip flexors Moderate tightness on B piriformis and gastrocsoleus  POSTURE: rounded shoulders and forward head  PALPATION: No tenderness on B knees  LOWER EXTREMITY ROM:  Active ROM Right eval Left eval  Hip flexion [facility]  Hip extension    Hip abduction [facility]  Hip adduction [facility]  Hip internal rotation    Hip external rotation    Knee flexion 110 125  Knee extension [facility]  Ankle dorsiflexion [facility] WFL  Ankle plantarflexion [facility] WFL  Ankle inversion    Ankle eversion     (Blank rows = not tested)  LOWER EXTREMITY MMT:  MMT Right eval Left eval  Hip flexion 4- 4+  Hip extension    Hip abduction 3+ 4+  Hip adduction 4 4+  Hip internal rotation    Hip external rotation    Knee flexion 3+ 4+  Knee extension 4 4+  Ankle dorsiflexion 4+ 4+  Ankle plantarflexion 4+ 4+  Ankle inversion    Ankle eversion     (Blank rows = not tested)  LOWER EXTREMITY SPECIAL TESTS:  Hip special tests: Marcello Moores test: positive   FUNCTIONAL TESTS:  2 minute walk test: 169 ft  Good static/dynamic standing balance  GAIT: Distance walked: 169 Assistive device utilized: Quad cane small base Level  of assistance: SBA Comments: slight to mild antalgic gait   TODAY'S TREATMENT:  DATE:  06/27/22 Recumbent stepper, seat 4, level 4 x 5' Passive prone R quads stretch x 30" Seated hamstring stretch x 30" x 3 on B Gastrocnemius stretch on a slant board x 30"on B LAQ x 3" x 10 x 2 x 3 lbs Standing x 10 x 2 x 3 lbs  Hip abd with 1 UE support  Hip ext with 1 UE support  Heel raises  Hamstring curls  Marches Mini squats x 5" x 10  x 2 Forward step ups, 6" box x 10 on each (no UE support) Forward downs ups, 6" box x 10 on each with UE support Walking on 2 Airomats (inside // bars), CGA, no AD x 5 rounds  06/25/22 Rec bike lv 4 5 min  6 inch power up x 10 (difficulty to hold position)  Standing hip abduction GTB 3 x10 Standing hip extension GTB 3 x 10  SLS 3 x10"   Stairs 4 inch 3 RT reciprocal single hand rail  Sidestepping 3RT  Tandem gait 2RT   06/21/22 (on bilateral) Recumbent stepper, seat 4, level 2 x 5' Passive prone R quads stretch x 30" Seated hamstring stretch x 30" x 3 on B Gastrocnemius stretch on a slant board x 30"on B LAQ x 3" x 10 x 2 x 3 lbs Standing x 10 x 2 x 3 lbs  Hip abd  Hip ext  Heel raises  Hamstring curls  Marches Mini squats x 3" x 10  x 2 Forward step ups, 4" box x 10  x 2 on each Tandem stance, firm surface, eyes open x 30" x 2 sets on each Single leg stance x 3" x 10 on each Walking on level surface, CGA, no AD x 40 ft x 1 round Sidestepping, HHA, x 40 ft x 1 round  06/19/22 Recumbent stepper, seat 4, level 1 x 5' Passive prone R quads stretch x 30" Prone R quads stretch with a strap x 30" x 2 Seated hamstring stretch x 30" x 3 on B Seated gastrocnemius stretch with a strap x 30" on B Gastrocnemius stretch on a slant board x 30"on B Standing x 10 x 2 x 2 lbs  Hip abd  Hip ext  Heel raises  Marches Mini squats x  3" x 10   PATIENT EDUCATION:  Education details: To continue walking with a cane outdoors Person educated: Patient Education method: Explanation Education comprehension: verbalized understanding  HOME EXERCISE PROGRAM: Access Code: [email or web address] URL: [email or web address]  06/25/22 - Tandem Stance with Support  - 1-2 x daily - 7 x weekly - 1 sets - 3 reps - 30 second hold - Single Leg Stance with Support  - 1-2 x daily - 7 x weekly - 1 sets - 3 reps - 10-15 second hold - Hip Abduction with Resistance Loop  - 1-2 x daily - 7 x weekly - 2 sets - 10 reps - Hip Extension with Resistance Loop  - 1-2 x daily - 7 x weekly - 2 sets - 10 reps  06/19/22 - Seated Hamstring Stretch  - 2 x daily - 7 x weekly - 3 reps - 30 hold - Seated Calf Stretch with Strap  - 2 x daily - 7 x weekly - 3 reps - 30 hold - Mini Squat with Counter Support  - 2 x daily - 7 x weekly - 2 sets - 10 reps - 3 hold Date: 06/10/2022 Prepared by: Josue Hector Exercises - Supine Quad Set  - 2 x  daily - 7 x weekly - 2 sets - 10 reps -  5 second hold - Active Straight Leg Raise with Quad Set  - 2 x daily - 7 x weekly - 2 sets - 10 reps - Supine Bridge  - 2 x daily - 7 x weekly - 2 sets - 10 reps - Sidelying Hip Abduction  - 2 x daily - 7 x weekly - 2 sets - 10 reps - Seated Long Arc Quad  - 2 x daily - 7 x weekly - 2 sets - 10 reps - 5 second hold - Heel Raises with Counter Support  - 2 x daily - 7 x weekly - 2 sets - 10 reps - Toe Raises with Counter Support  - 2 x daily - 7 x weekly - 2 sets - 10 reps  ASSESSMENT:  CLINICAL IMPRESSION: Interventions today were geared towards gait training, LE strength, balance, and flexibility. Tolerated all activities without pain. Demonstrated appropriate levels of fatigue. Rest periods provided. Required mild amount of cueing to ensure correct execution of activity as patient demonstrates compensatory movements onn hip abd, hamstring curls, and marches due to weakness.  Attempted to perform step ups on a 7" stair step without UE support but patient had severe difficulty due to weakness and impaired proprioception. However, patient demonstrates only mild difficulty with the activity on a 6" box. Patient required UE support on the step downs as the R LE is still weak. Mild unsteadiness noted when walking on Airomat so patient was still advised to walk with a cane outdoors or any uneven surface. To date, skilled PT is required to address the impairments and improve function.   OBJECTIVE IMPAIRMENTS: Abnormal gait, decreased balance, difficulty walking, decreased ROM, decreased strength, impaired flexibility, and pain.   ACTIVITY LIMITATIONS: lifting, bending, standing, squatting, stairs, and transfers  PARTICIPATION LIMITATIONS: cleaning, laundry, and community activity  PERSONAL FACTORS: 1 comorbidity: breathing issues  are also affecting patient's functional outcome.   REHAB POTENTIAL: Good  CLINICAL DECISION MAKING: Stable/uncomplicated  EVALUATION COMPLEXITY: Low   GOALS: Goals reviewed with patient? Yes  SHORT TERM GOALS: Target date: 07/03/22 Pt will demonstrate indep in HEP to facilitate carry-over of skilled services and improve functional outcomes Baseline: Goal status: IN PROGRESS  2.  Pt will demonstrate increase in knee flex ROM to 120 to facilitate ease in ambulation Baseline: 110 Goal status: INITIAL  3.  Pt will demonstrate increase in LE strength to 4/5 to facilitate ease and safety in ambulation Baseline: 4/5 Goal status: INITIAL  LONG TERM GOALS: Target date: 07/31/22  Pt will be able to walk independently without AD for > 200 ft Baseline: ambulatory with quad cane Goal status: INITIAL  2.  Pt will be able to do stair negotiation (1 flight) without AD Baseline: uses a quad cane Goal status: INITIAL  3.  Pt will increase LEFS by at least 9 points in order to demonstrate significant improvement in lower extremity function.   Baseline: 44 Goal status: INITIAL  4.  Pt will increase 2MWT by at least 40 ft in order to demonstrate clinically significant improvement in community ambulation Baseline: 169 ft Goal status: INITIAL  PLAN:  PT FREQUENCY: 2x/week  PT DURATION: 8 weeks  PLANNED INTERVENTIONS: Therapeutic exercises, Therapeutic activity, Neuromuscular re-education, Balance training, Gait training, Patient/Family education, Self Care, Stair training, and Manual therapy  PLAN FOR NEXT SESSION: Continue POC and may progress as tolerated with emphasis on LE strengthening, flexibility, and balance exercises as well as  gait training.  4:31 PM, 06/27/22 Harvie Heck. Fonnie Crookshanks, PT, DPT, OCS Board-Certified Clinical Specialist in [place] # ([place]): [phone] T

## 2022-06-29 NOTE — Progress Notes (Signed)
Subjective: Chief Complaint  Patient presents with   Ingrown Toenail    Right foot hallux nail check, and bone growth    52 year old female presents the office today with the above concerns.  He states that she still gets some tenderness to the top of the toe, nailbed.  She wants to proceed with removal of the spur at this time as she is improving from her other injury and her therapy is going well.  Denies any swelling redness or drainage.    Objective: AAO x3, NAD DP/PT pulses palpable bilaterally, CRT less than 3 seconds Status post nail avulsion right hallux.  Nailbed appears to be healing well.  There is still sensitivity present.  Prominence noted on the nailbed as well.  Presents for erythema, ascending cellulitis.  Ulcers or crepitation.  No malodor. No pain with calf compression, swelling, warmth, erythema  Assessment: Osteochondroma, exostosis  Plan: -All treatment options discussed with the patient including all alternatives, risks, complications.  -At this time we discussed again surgical intervention for exostectomy, removal of likely osteochondroma.  After discussion she wishes to proceed with this. -The incision placement as well as the postoperative course was discussed with the patient. I discussed risks of the surgery which include, but not limited to, infection, bleeding, pain, swelling, need for further surgery, delayed or nonhealing, painful or ugly scar, numbness or sensation changes, recurrence, transfer lesions, further deformity, toenail dystrophy, DVT/PE, loss of toe/foot. Patient understands these risks and wishes to proceed with surgery. The surgical consent was reviewed with the patient all 3 pages were signed. No promises or guarantees were given to the outcome of the procedure. All questions were answered to the best of my ability. Before the surgery the patient was encouraged to call the office if there is any further questions. The surgery will be performed at  the [facility] on an outpatient basis.  No follow-ups on file.  Trula Slade DPM

## 2022-07-01 ENCOUNTER — Ambulatory Visit (HOSPITAL_COMMUNITY): Payer: Medicaid Other | Admitting: Physical Therapy

## 2022-07-01 DIAGNOSIS — R262 Difficulty in walking, not elsewhere classified: Secondary | ICD-10-CM | POA: Diagnosis not present

## 2022-07-01 NOTE — Therapy (Signed)
OUTPATIENT PHYSICAL THERAPY LOWER EXTREMITY TREATMENT   Patient Name: Latoya Bautista MRN: [id number] DOB:[birth year], 52 y.o., female Today's Date: 07/01/2022  END OF SESSION:  PT End of Session - 07/01/22 1033     Visit Number 8    Number of Visits 16    Date for PT Re-Evaluation 07/31/22    Authorization Type N Medicaid [employer]    Authorization Time Period 8 approved 1/31 to 08/04/22    Authorization - Visit Number 7    Authorization - Number of Visits 8    Progress Note Due on Visit 9    PT Start Time 1030    PT Stop Time 1110    PT Time Calculation (min) 40 min    Activity Tolerance Patient tolerated treatment well    Behavior During Therapy WFL for tasks assessed/performed            Past Medical History:  Diagnosis Date   Anemia    Cancer ([place])    tongue cancer   H/O echocardiogram    a. 07/2017: echo showing EF of 55-60%, Grade 1 DD, and no significant valve abnormalities.    Hepatitis C    Hepatitis C 04/11/2011   Leukemia ([place])    Liver cirrhosis ([place]) 04/11/2011   Osteoporosis    Pulmonary fibrosis (HCC)    Seizures (HCC)    Shingles    Thrombocytopenia ([place]) 04/11/2011   Thyroid disease    Past Surgical History:  Procedure Laterality Date   BONE MARROW BIOPSY  05/07/2011   BONE MARROW TRANSPLANT     BUNIONECTOMY     CHOLECYSTECTOMY     FEMUR IM NAIL Right 05/06/2022   Procedure: INTRAMEDULLARY (IM) NAIL FEMORAL;  Surgeon: Shona Needles, MD;  Location: [facility];  Service: Orthopedics;  Laterality: Right;   liver biopsie     TONGUE BIOPSY     tongue cancer     surgical removal of area   Patient Active Problem List   Diagnosis Date Noted   AKI (acute kidney injury) ([place]) 05/07/2022   Chronic hyponatremia 05/07/2022   Pathological fracture of right femur due to osteoporosis ([place]) 05/07/2022   Femur fracture, right ([place]) 05/05/2022   Malignant neoplasm of tongue, tip and lateral border ([place]) 05/30/2021   Bone marrow transplant status ([place])  05/22/2021   ILD (interstitial lung disease) ([place]) 04/18/2021   Squamous cell carcinoma of lateral tongue ([place]) 12/12/2020   Melanoma in situ of skin of buttock ([place]) 08/27/2018   Vulvar lesion 06/09/2018   History of thrombocytopenia 12/11/2015   Well woman exam with routine gynecological exam 10/18/2013   Localization-related epilepsy ([place]) 01/01/2013   Partial epilepsy with impairment of consciousness ([place]) 12/15/2012   Postmenopausal bleeding DUE TO hormone tx. 10/08/2012   History of hepatitis C 04/11/2011   Liver cirrhosis ([place]) 04/11/2011   Thrombocytopenia ([place]) 04/11/2011    PCP: Allyn Kenner MD  REFERRING PROVIDER: Corinne Ports, PA-C  REFERRING DIAG: R femoral shaft fx  THERAPY DIAG:  Difficulty in walking involving lower leg joint  Rationale for Evaluation and Treatment: Rehabilitation  ONSET DATE: 05/05/22  SUBJECTIVE:   SUBJECTIVE STATEMENT: Patient states she is "alright" today. She is moving a little slow because she "overdid it" yesterday at church. Thigh muscles are feeling sore today.   Patient denies any pain at the moment (0/10). However, the R knee hurts at times at night (2/10 on NPRS). Patient reports that the R leg will give way at times. Patient is still  having difficulty bending over. Condition started on 05/05/22, when patient missed the step on the stairs. Patient was taken to the hospital and X-ray was done which revealed R femoral fx. Patient then had surgery 05/06/22 (intramedullary nail femoral). Patient received PT in the hospital and was able to walk with a walker. Patient was then sent to [place] to continue PT for 19 days. Patient is now ambulatory with a quad cane and was sent to outpatient PT evaluation and management.  PERTINENT HISTORY: Osteoporosis, thrombocytopenia, breathing issues  PAIN:  Are you having pain? No  PRECAUTIONS: Fall  WEIGHT BEARING RESTRICTIONS: No  FALLS:  Has patient fallen in last 6 months? Yes. Number  of falls 1  LIVING ENVIRONMENT: Lives with: lives with their family and lives with their spouse Lives in: House/apartment Stairs: Yes: Internal: 16 steps; can reach both and External: 3 steps; on left going up Has following equipment at home: Quad cane small base and Grab bars  OCCUPATION: on disability  PLOF: Independent  PATIENT GOALS: "to walk better"  NEXT MD VISIT: 06/25/22  OBJECTIVE:   Ambulatory with quad cane  DIAGNOSTIC FINDINGS:   05/06/22 Femoral X-ray Right femoral fracture repair as above. Alignment is near anatomic by the end of the study.  PATIENT SURVEYS:  LEFS 44/80  COGNITION: Overall cognitive status: Within functional limits for tasks assessed     SENSATION: WFL   MUSCLE LENGTH: Mild tightness on B hamstrings, hip flexors Moderate tightness on B piriformis and gastrocsoleus  POSTURE: rounded shoulders and forward head  PALPATION: No tenderness on B knees  LOWER EXTREMITY ROM:  Active ROM Right eval Left eval  Hip flexion [facility]  Hip extension    Hip abduction [facility]  Hip adduction [facility]  Hip internal rotation    Hip external rotation    Knee flexion 110 125  Knee extension [facility]  Ankle dorsiflexion [facility] WFL  Ankle plantarflexion [facility] WFL  Ankle inversion    Ankle eversion     (Blank rows = not tested)  LOWER EXTREMITY MMT:  MMT Right eval Left eval  Hip flexion 4- 4+  Hip extension    Hip abduction 3+ 4+  Hip adduction 4 4+  Hip internal rotation    Hip external rotation    Knee flexion 3+ 4+  Knee extension 4 4+  Ankle dorsiflexion 4+ 4+  Ankle plantarflexion 4+ 4+  Ankle inversion    Ankle eversion     (Blank rows = not tested)  LOWER EXTREMITY SPECIAL TESTS:  Hip special tests: Marcello Moores test: positive   FUNCTIONAL TESTS:  2 minute walk test: 169 ft  Good static/dynamic standing balance  GAIT: Distance walked: 169 Assistive device utilized: Quad cane small base Level of assistance: SBA Comments: slight  to mild antalgic gait   TODAY'S TREATMENT:  DATE:  07/01/22 Rec bike lv 4 5 min  Tandem on foam 2 x 30" each Chair squats 2 x 10 HR x 20  4 inch step up x 10 each HHA x 1  4 inch power up x 10 HHA x 1 Standing hip abduction 5 lb 2 x10 Standing hip extension 5 lb 2 x 10 Standing march 5lb x20 each  Standing knee flexion 5lb 2 x 10 each  SLS 3 x10" Int HHA (max 8 sec on RLE)  Seated LAQ 5lb 2 x 10 each   06/27/22 Recumbent stepper, seat 4, level 4 x 5' Passive prone R quads stretch x 30" Seated hamstring stretch x 30" x 3 on B Gastrocnemius stretch on a slant board x 30"on B LAQ x 3" x 10 x 2 x 3 lbs Standing x 10 x 2 x 3 lbs  Hip abd with 1 UE support  Hip ext with 1 UE support  Heel raises  Hamstring curls  Marches Mini squats x 5" x 10  x 2 Forward step ups, 6" box x 10 on each (no UE support) Forward downs ups, 6" box x 10 on each with UE support Walking on 2 Airomats (inside // bars), CGA, no AD x 5 rounds  06/25/22 Rec bike lv 4 5 min  6 inch power up x 10 (difficulty to hold position)  Standing hip abduction GTB 3 x10 Standing hip extension GTB 3 x 10  SLS 3 x10"   Stairs 4 inch 3 RT reciprocal single hand rail  Sidestepping 3RT  Tandem gait 2RT   PATIENT EDUCATION:  Education details: To continue walking with a cane outdoors Person educated: Patient Education method: Explanation Education comprehension: verbalized understanding  HOME EXERCISE PROGRAM: Access Code: [email or web address] URL: [email or web address]  06/25/22 - Tandem Stance with Support  - 1-2 x daily - 7 x weekly - 1 sets - 3 reps - 30 second hold - Single Leg Stance with Support  - 1-2 x daily - 7 x weekly - 1 sets - 3 reps - 10-15 second hold - Hip Abduction with Resistance Loop  - 1-2 x daily - 7 x weekly - 2 sets - 10 reps - Hip Extension with Resistance Loop   - 1-2 x daily - 7 x weekly - 2 sets - 10 reps  06/19/22 - Seated Hamstring Stretch  - 2 x daily - 7 x weekly - 3 reps - 30 hold - Seated Calf Stretch with Strap  - 2 x daily - 7 x weekly - 3 reps - 30 hold - Mini Squat with Counter Support  - 2 x daily - 7 x weekly - 2 sets - 10 reps - 3 hold Date: 06/10/2022 Prepared by: Josue Hector Exercises - Supine Quad Set  - 2 x daily - 7 x weekly - 2 sets - 10 reps -  5 second hold - Active Straight Leg Raise with Quad Set  - 2 x daily - 7 x weekly - 2 sets - 10 reps - Supine Bridge  - 2 x daily - 7 x weekly - 2 sets - 10 reps - Sidelying Hip Abduction  - 2 x daily - 7 x weekly - 2 sets - 10 reps - Seated Long Arc Quad  - 2 x daily - 7 x weekly - 2 sets - 10 reps - 5 second hold - Heel Raises with Counter Support  - 2 x daily - 7 x weekly - 2 sets - 10 reps -  Toe Raises with Counter Support  - 2 x daily - 7 x weekly - 2 sets - 10 reps  ASSESSMENT:  CLINICAL IMPRESSION: Patient tolerated session well today. Slightly weaker/ less steady on RLE today due to increased soreness. Graded activity per patient tolerance. Patient requires cues for pacing activity to slow movements down at times for improved target muscle activation. Increased to 5 lb ankle weights with standing hip series for LE strength progression. Patient continues to demo difficulty with balance activity, but is improving with tandem stance. Patient will continue to benefit from skilled therapy services to reduce remaining deficits and improve functional ability.   OBJECTIVE IMPAIRMENTS: Abnormal gait, decreased balance, difficulty walking, decreased ROM, decreased strength, impaired flexibility, and pain.   ACTIVITY LIMITATIONS: lifting, bending, standing, squatting, stairs, and transfers  PARTICIPATION LIMITATIONS: cleaning, laundry, and community activity  PERSONAL FACTORS: 1 comorbidity: breathing issues  are also affecting patient's functional outcome.   REHAB POTENTIAL:  Good  CLINICAL DECISION MAKING: Stable/uncomplicated  EVALUATION COMPLEXITY: Low   GOALS: Goals reviewed with patient? Yes  SHORT TERM GOALS: Target date: 07/03/22 Pt will demonstrate indep in HEP to facilitate carry-over of skilled services and improve functional outcomes Baseline: Goal status: IN PROGRESS  2.  Pt will demonstrate increase in knee flex ROM to 120 to facilitate ease in ambulation Baseline: 110 Goal status: INITIAL  3.  Pt will demonstrate increase in LE strength to 4/5 to facilitate ease and safety in ambulation Baseline: 4/5 Goal status: INITIAL  LONG TERM GOALS: Target date: 07/31/22  Pt will be able to walk independently without AD for > 200 ft Baseline: ambulatory with quad cane Goal status: INITIAL  2.  Pt will be able to do stair negotiation (1 flight) without AD Baseline: uses a quad cane Goal status: INITIAL  3.  Pt will increase LEFS by at least 9 points in order to demonstrate significant improvement in lower extremity function.  Baseline: 44 Goal status: INITIAL  4.  Pt will increase 2MWT by at least 40 ft in order to demonstrate clinically significant improvement in community ambulation Baseline: 169 ft Goal status: INITIAL  PLAN:  PT FREQUENCY: 2x/week  PT DURATION: 8 weeks  PLANNED INTERVENTIONS: Therapeutic exercises, Therapeutic activity, Neuromuscular re-education, Balance training, Gait training, Patient/Family education, Self Care, Stair training, and Manual therapy  PLAN FOR NEXT SESSION: Continue POC and may progress as tolerated with emphasis on LE strengthening, flexibility, and balance exercises as well as gait training.  11:11 AM, 07/01/22 Josue Hector PT DPT  Physical Therapist with [facility]  [phone]

## 2022-07-03 ENCOUNTER — Encounter (HOSPITAL_COMMUNITY): Payer: Medicaid Other

## 2022-07-03 DIAGNOSIS — M85852 Other specified disorders of bone density and structure, left thigh: Secondary | ICD-10-CM | POA: Diagnosis not present

## 2022-07-03 DIAGNOSIS — M810 Age-related osteoporosis without current pathological fracture: Secondary | ICD-10-CM | POA: Diagnosis not present

## 2022-07-03 DIAGNOSIS — N910 Primary amenorrhea: Secondary | ICD-10-CM | POA: Diagnosis not present

## 2022-07-03 DIAGNOSIS — M8000XS Age-related osteoporosis with current pathological fracture, unspecified site, sequela: Secondary | ICD-10-CM | POA: Diagnosis not present

## 2022-07-03 DIAGNOSIS — K769 Liver disease, unspecified: Secondary | ICD-10-CM | POA: Diagnosis not present

## 2022-07-03 DIAGNOSIS — E039 Hypothyroidism, unspecified: Secondary | ICD-10-CM | POA: Diagnosis not present

## 2022-07-03 DIAGNOSIS — K7460 Unspecified cirrhosis of liver: Secondary | ICD-10-CM | POA: Diagnosis not present

## 2022-07-03 DIAGNOSIS — Z8639 Personal history of other endocrine, nutritional and metabolic disease: Secondary | ICD-10-CM | POA: Diagnosis not present

## 2022-07-03 DIAGNOSIS — E7800 Pure hypercholesterolemia, unspecified: Secondary | ICD-10-CM | POA: Diagnosis not present

## 2022-07-03 DIAGNOSIS — I10 Essential (primary) hypertension: Secondary | ICD-10-CM | POA: Diagnosis not present

## 2022-07-03 DIAGNOSIS — R7989 Other specified abnormal findings of blood chemistry: Secondary | ICD-10-CM | POA: Diagnosis not present

## 2022-07-04 ENCOUNTER — Ambulatory Visit (HOSPITAL_COMMUNITY): Payer: Medicaid Other | Admitting: Physical Therapy

## 2022-07-04 DIAGNOSIS — R262 Difficulty in walking, not elsewhere classified: Secondary | ICD-10-CM

## 2022-07-04 NOTE — Therapy (Signed)
OUTPATIENT PHYSICAL THERAPY LOWER EXTREMITY TREATMENT   Patient Name: Latoya Bautista MRN: [id number] DOB:Oct 16, 1970, 52 y.o., female Today's Date: 07/04/2022  Progress Note Reporting Period 06/05/22 to 07/04/22  See note below for Objective Data and Assessment of Progress/Goals.     END OF SESSION:  PT End of Session - 07/04/22 0808     Visit Number 9    Number of Visits 13    Date for PT Re-Evaluation 08/02/22    Authorization Type N Medicaid [employer]    Authorization Time Period submitted for 4 more check auth    Authorization - Visit Number 8    Authorization - Number of Visits 8    Progress Note Due on Visit 14    PT Start Time 0815    PT Stop Time 0900    PT Time Calculation (min) 45 min    Activity Tolerance Patient tolerated treatment well    Behavior During Therapy WFL for tasks assessed/performed            Past Medical History:  Diagnosis Date   Anemia    Cancer ([place])    tongue cancer   H/O echocardiogram    a. 07/2017: echo showing EF of 55-60%, Grade 1 DD, and no significant valve abnormalities.    Hepatitis C    Hepatitis C 04/11/2011   Leukemia ([place])    Liver cirrhosis ([place]) 04/11/2011   Osteoporosis    Pulmonary fibrosis (HCC)    Seizures (HCC)    Shingles    Thrombocytopenia ([place]) 04/11/2011   Thyroid disease    Past Surgical History:  Procedure Laterality Date   BONE MARROW BIOPSY  05/07/2011   BONE MARROW TRANSPLANT     BUNIONECTOMY     CHOLECYSTECTOMY     FEMUR IM NAIL Right 05/06/2022   Procedure: INTRAMEDULLARY (IM) NAIL FEMORAL;  Surgeon: Shona Needles, MD;  Location: [place];  Service: Orthopedics;  Laterality: Right;   liver biopsie     TONGUE BIOPSY     tongue cancer     surgical removal of area   Patient Active Problem List   Diagnosis Date Noted   AKI (acute kidney injury) ([place]) 05/07/2022   Chronic hyponatremia 05/07/2022   Pathological fracture of right femur due to osteoporosis ([place]) 05/07/2022   Femur fracture, right  ([place]) 05/05/2022   Malignant neoplasm of tongue, tip and lateral border ([place]) 05/30/2021   Bone marrow transplant status ([place]) 05/22/2021   ILD (interstitial lung disease) ([place]) 04/18/2021   Squamous cell carcinoma of lateral tongue ([place]) 12/12/2020   Melanoma in situ of skin of buttock ([place]) 08/27/2018   Vulvar lesion 06/09/2018   History of thrombocytopenia 12/11/2015   Well woman exam with routine gynecological exam 10/18/2013   Localization-related epilepsy ([place]) 01/01/2013   Partial epilepsy with impairment of consciousness ([place]) 12/15/2012   Postmenopausal bleeding DUE TO hormone tx. 10/08/2012   History of hepatitis C 04/11/2011   Liver cirrhosis ([place]) 04/11/2011   Thrombocytopenia ([place]) 04/11/2011    PCP: Allyn Kenner MD  REFERRING PROVIDER: Corinne Ports, PA-C  REFERRING DIAG: R femoral shaft fx  THERAPY DIAG:  Difficulty in walking involving lower leg joint  Rationale for Evaluation and Treatment: Rehabilitation  ONSET DATE: 05/05/22  SUBJECTIVE:   SUBJECTIVE STATEMENT: Doing good today. Soreness went away after last session. Doing pretty good with everything but still working on steps.  Patient denies any pain at the moment (0/10). However, the R knee hurts at times at night (2/10  on NPRS). Patient reports that the R leg will give way at times. Patient is still having difficulty bending over. Condition started on 05/05/22, when patient missed the step on the stairs. Patient was taken to the hospital and X-ray was done which revealed R femoral fx. Patient then had surgery 05/06/22 (intramedullary nail femoral). Patient received PT in the hospital and was able to walk with a walker. Patient was then sent to [place] to continue PT for 19 days. Patient is now ambulatory with a quad cane and was sent to outpatient PT evaluation and management.  PERTINENT HISTORY: Osteoporosis, thrombocytopenia, breathing issues  PAIN:  Are you having pain? No  PRECAUTIONS:  Fall  WEIGHT BEARING RESTRICTIONS: No  FALLS:  Has patient fallen in last 6 months? Yes. Number of falls 1  LIVING ENVIRONMENT: Lives with: lives with their family and lives with their spouse Lives in: House/apartment Stairs: Yes: Internal: 16 steps; can reach both and External: 3 steps; on left going up Has following equipment at home: Quad cane small base and Grab bars  OCCUPATION: on disability  PLOF: Independent  PATIENT GOALS: "to walk better"  NEXT MD VISIT: 06/25/22  OBJECTIVE:   Ambulatory with quad cane  DIAGNOSTIC FINDINGS:   05/06/22 Femoral X-ray Right femoral fracture repair as above. Alignment is near anatomic by the end of the study.  PATIENT SURVEYS:  LEFS 54/80 (was 44/80)  COGNITION: Overall cognitive status: Within functional limits for tasks assessed     SENSATION: WFL  MUSCLE LENGTH: Mild tightness on B hamstrings, hip flexors Moderate tightness on B piriformis and gastrocsoleus  POSTURE: rounded shoulders and forward head  PALPATION: No tenderness on B knees  LOWER EXTREMITY ROM:  Active ROM Right eval Left eval Right 07/04/22 Left 07/04/22  Hip flexion [facility]    Hip extension      Hip abduction [facility]    Hip adduction [facility]    Hip internal rotation      Hip external rotation      Knee flexion 110 125 130 130  Knee extension [facility] WFL 0 0  Ankle dorsiflexion [facility]    Ankle plantarflexion [facility]    Ankle inversion      Ankle eversion       (Blank rows = not tested)  LOWER EXTREMITY MMT:  MMT Right eval Left eval Right 07/04/22 Left 07/04/22  Hip flexion 4- 4+ 5 5  Hip extension   4+ 4+  Hip abduction 3+ 4+ 4+ 5  Hip adduction 4 4+    Hip internal rotation      Hip external rotation      Knee flexion 3+ 4+ 5 5  Knee extension 4 4+ 4+ 5  Ankle dorsiflexion 4+ 4+ 4+ 5  Ankle plantarflexion 4+ 4+    Ankle inversion      Ankle eversion       (Blank rows = not tested)  LOWER EXTREMITY SPECIAL TESTS:  Hip special  tests: Marcello Moores test: positive   FUNCTIONAL TESTS:  2 minute walk test: 430 ft  Good static/dynamic standing balance  GAIT: Distance walked: 430 Assistive device utilized: None Level of assistance: SBA Comments: slight Trendelenburg   TODAY'S TREATMENT:  DATE:  07/04/22 Nustep lv 4 8 min (during LEFS)  LEFS 2 MWT AROM MMT  Stairs   POC review   07/01/22 Rec bike lv 4 5 min  Tandem on foam 2 x 30" each Chair squats 2 x 10 HR x 20  4 inch step up x 10 each HHA x 1  4 inch power up x 10 HHA x 1 Standing hip abduction 5 lb 2 x10 Standing hip extension 5 lb 2 x 10 Standing march 5lb x20 each  Standing knee flexion 5lb 2 x 10 each  SLS 3 x10" Int HHA (max 8 sec on RLE)  Seated LAQ 5lb 2 x 10 each   06/27/22 Recumbent stepper, seat 4, level 4 x 5' Passive prone R quads stretch x 30" Seated hamstring stretch x 30" x 3 on B Gastrocnemius stretch on a slant board x 30"on B LAQ x 3" x 10 x 2 x 3 lbs Standing x 10 x 2 x 3 lbs  Hip abd with 1 UE support  Hip ext with 1 UE support  Heel raises  Hamstring curls  Marches Mini squats x 5" x 10  x 2 Forward step ups, 6" box x 10 on each (no UE support) Forward downs ups, 6" box x 10 on each with UE support Walking on 2 Airomats (inside // bars), CGA, no AD x 5 rounds   PATIENT EDUCATION:  Education details: To continue walking with a cane outdoors Person educated: Patient Education method: Explanation Education comprehension: verbalized understanding  HOME EXERCISE PROGRAM: Access Code: [email or web address] URL: [email or web address]  06/25/22 - Tandem Stance with Support  - 1-2 x daily - 7 x weekly - 1 sets - 3 reps - 30 second hold - Single Leg Stance with Support  - 1-2 x daily - 7 x weekly - 1 sets - 3 reps - 10-15 second hold - Hip Abduction with Resistance Loop  - 1-2 x daily - 7 x weekly  - 2 sets - 10 reps - Hip Extension with Resistance Loop  - 1-2 x daily - 7 x weekly - 2 sets - 10 reps  06/19/22 - Seated Hamstring Stretch  - 2 x daily - 7 x weekly - 3 reps - 30 hold - Seated Calf Stretch with Strap  - 2 x daily - 7 x weekly - 3 reps - 30 hold - Mini Squat with Counter Support  - 2 x daily - 7 x weekly - 2 sets - 10 reps - 3 hold Date: 06/10/2022 Prepared by: Josue Hector Exercises - Supine Quad Set  - 2 x daily - 7 x weekly - 2 sets - 10 reps -  5 second hold - Active Straight Leg Raise with Quad Set  - 2 x daily - 7 x weekly - 2 sets - 10 reps - Supine Bridge  - 2 x daily - 7 x weekly - 2 sets - 10 reps - Sidelying Hip Abduction  - 2 x daily - 7 x weekly - 2 sets - 10 reps - Seated Long Arc Quad  - 2 x daily - 7 x weekly - 2 sets - 10 reps - 5 second hold - Heel Raises with Counter Support  - 2 x daily - 7 x weekly - 2 sets - 10 reps - Toe Raises with Counter Support  - 2 x daily - 7 x weekly - 2 sets - 10 reps  ASSESSMENT:  CLINICAL IMPRESSION: Patient has made very food progress and  has currently met all except 1 LTG. She is concerned about her ability to ambulate stairs. She does not feel confident practicing this on her own. She shows good AROM, good improvement in MMT and significant increase in gait speed. She remains limited by slight gait deviation and mild functional weakness with squatting/ stairs. She also remains challenged with balance activity. These factors continue to contribute to decreased functional ability. Patient will continue to benefit form skilled therapy services to address remaining deficits for improved functional mobility and reduced risk for future falls.   OBJECTIVE IMPAIRMENTS: Abnormal gait, decreased balance, difficulty walking, decreased ROM, decreased strength, impaired flexibility, and pain.   ACTIVITY LIMITATIONS: lifting, bending, standing, squatting, stairs, and transfers  PARTICIPATION LIMITATIONS: cleaning, laundry, and  community activity  PERSONAL FACTORS: 1 comorbidity: breathing issues  are also affecting patient's functional outcome.   REHAB POTENTIAL: Good  CLINICAL DECISION MAKING: Stable/uncomplicated  EVALUATION COMPLEXITY: Low   GOALS: Goals reviewed with patient? Yes  SHORT TERM GOALS: Target date: 07/03/22 Pt will demonstrate indep in HEP to facilitate carry-over of skilled services and improve functional outcomes Baseline: Goal status: MET  2.  Pt will demonstrate increase in knee flex ROM to 120 to facilitate ease in ambulation Baseline: See AROM  Goal status: MET  3.  Pt will demonstrate increase in LE strength to 4/5 to facilitate ease and safety in ambulation Baseline: See MMT  Goal status: MET  LONG TERM GOALS: Target date: 07/31/22  Pt will be able to walk independently without AD for > 200 ft Baseline: 430 feet with no AD Goal status: MET  2.  Pt will be able to do stair negotiation (1 flight) without AD Baseline: Can ambulate 7 inch stairs with single rail, good ascending decreased RLE control descending  Goal status: IN PROGRESS  3.  Pt will increase LEFS by at least 9 points in order to demonstrate significant improvement in lower extremity function.  Baseline: 54/80 increased by 10 Goal status: MET  4.  Pt will increase 2MWT by at least 40 ft in order to demonstrate clinically significant improvement in community ambulation Baseline: 430 feet with no AD Goal status: MET  PLAN:  PT FREQUENCY: 1x/week  PT DURATION: 4 weeks  PLANNED INTERVENTIONS: Therapeutic exercises, Therapeutic activity, Neuromuscular re-education, Balance training, Gait training, Patient/Family education, Self Care, Stair training, and Manual therapy  PLAN FOR NEXT SESSION: Continue POC and may progress as tolerated with emphasis on LE strengthening and balance exercises as well as gait training. Gait on uneven services.   9:11 AM, 07/04/22 Josue Hector PT DPT  Physical Therapist  with [facility]  [phone]

## 2022-07-05 DIAGNOSIS — Z419 Encounter for procedure for purposes other than remedying health state, unspecified: Secondary | ICD-10-CM | POA: Diagnosis not present

## 2022-07-08 ENCOUNTER — Ambulatory Visit (HOSPITAL_COMMUNITY): Payer: Medicaid Other | Attending: Student | Admitting: Physical Therapy

## 2022-07-08 DIAGNOSIS — R262 Difficulty in walking, not elsewhere classified: Secondary | ICD-10-CM | POA: Diagnosis not present

## 2022-07-08 NOTE — Therapy (Signed)
OUTPATIENT PHYSICAL THERAPY LOWER EXTREMITY TREATMENT   Patient Name: Latoya Bautista MRN: [id number] DOB:[birth year], 52 y.o., female Today's Date: 07/08/2022   See note below for Objective Data and Assessment of Progress/Goals.     END OF SESSION:  PT End of Session - 07/08/22 0824     Visit Number 10    Number of Visits 13    Date for PT Re-Evaluation 08/02/22    Authorization Type N Medicaid [employer]    Authorization Time Period submitted for 4 more check auth    Authorization - Visit Number 1    Authorization - Number of Visits 4    Progress Note Due on Visit 14    PT Start Time 0822    PT Stop Time 0900    PT Time Calculation (min) 38 min    Activity Tolerance Patient tolerated treatment well    Behavior During Therapy WFL for tasks assessed/performed            Past Medical History:  Diagnosis Date   Anemia    Cancer ([place])    tongue cancer   H/O echocardiogram    a. 07/2017: echo showing EF of 55-60%, Grade 1 DD, and no significant valve abnormalities.    Hepatitis C    Hepatitis C 04/11/2011   Leukemia ([place])    Liver cirrhosis ([place]) 04/11/2011   Osteoporosis    Pulmonary fibrosis ([facility])    Seizures ([facility])    Shingles    Thrombocytopenia ([place]) 04/11/2011   Thyroid disease    Past Surgical History:  Procedure Laterality Date   BONE MARROW BIOPSY  05/07/2011   BONE MARROW TRANSPLANT     BUNIONECTOMY     CHOLECYSTECTOMY     FEMUR IM NAIL Right 05/06/2022   Procedure: INTRAMEDULLARY (IM) NAIL FEMORAL;  Surgeon: Shona Needles, MD;  Location: [place];  Service: Orthopedics;  Laterality: Right;   liver biopsie     TONGUE BIOPSY     tongue cancer     surgical removal of area   Patient Active Problem List   Diagnosis Date Noted   AKI (acute kidney injury) ([place]) 05/07/2022   Chronic hyponatremia 05/07/2022   Pathological fracture of right femur due to osteoporosis ([place]) 05/07/2022   Femur fracture, right ([place]) 05/05/2022   Malignant neoplasm of tongue, tip  and lateral border ([place]) 05/30/2021   Bone marrow transplant status ([place]) 05/22/2021   ILD (interstitial lung disease) ([place]) 04/18/2021   Squamous cell carcinoma of lateral tongue ([place]) 12/12/2020   Melanoma in situ of skin of buttock ([place]) 08/27/2018   Vulvar lesion 06/09/2018   History of thrombocytopenia 12/11/2015   Well woman exam with routine gynecological exam 10/18/2013   Localization-related epilepsy ([place]) 01/01/2013   Partial epilepsy with impairment of consciousness ([place]) 12/15/2012   Postmenopausal bleeding DUE TO hormone tx. 10/08/2012   History of hepatitis C 04/11/2011   Liver cirrhosis ([place]) 04/11/2011   Thrombocytopenia ([place]) 04/11/2011    PCP: Allyn Kenner MD  REFERRING PROVIDER: Corinne Ports, PA-C  REFERRING DIAG: R femoral shaft fx  THERAPY DIAG:  Difficulty in walking involving lower leg joint  Rationale for Evaluation and Treatment: Rehabilitation  ONSET DATE: 05/05/22  SUBJECTIVE:   SUBJECTIVE STATEMENT: Patient states she is doing well. Has been walking more without her cane.   Patient denies any pain at the moment (0/10). However, the R knee hurts at times at night (2/10 on NPRS). Patient reports that the R leg will give way at  times. Patient is still having difficulty bending over. Condition started on 05/05/22, when patient missed the step on the stairs. Patient was taken to the hospital and X-ray was done which revealed R femoral fx. Patient then had surgery 05/06/22 (intramedullary nail femoral). Patient received PT in the hospital and was able to walk with a walker. Patient was then sent to [place] to continue PT for 19 days. Patient is now ambulatory with a quad cane and was sent to outpatient PT evaluation and management.  PERTINENT HISTORY: Osteoporosis, thrombocytopenia, breathing issues  PAIN:  Are you having pain? No  PRECAUTIONS: Fall  WEIGHT BEARING RESTRICTIONS: No  FALLS:  Has patient fallen in last 6 months? Yes. Number  of falls 1  LIVING ENVIRONMENT: Lives with: lives with their family and lives with their spouse Lives in: House/apartment Stairs: Yes: Internal: 16 steps; can reach both and External: 3 steps; on left going up Has following equipment at home: Quad cane small base and Grab bars  OCCUPATION: on disability  PLOF: Independent  PATIENT GOALS: "to walk better"  NEXT MD VISIT: 06/25/22  OBJECTIVE:   Ambulatory with quad cane  DIAGNOSTIC FINDINGS:   05/06/22 Femoral X-ray Right femoral fracture repair as above. Alignment is near anatomic by the end of the study.  PATIENT SURVEYS:  LEFS 54/80 (was 44/80)  COGNITION: Overall cognitive status: Within functional limits for tasks assessed     SENSATION: WFL  MUSCLE LENGTH: Mild tightness on B hamstrings, hip flexors Moderate tightness on B piriformis and gastrocsoleus  POSTURE: rounded shoulders and forward head  PALPATION: No tenderness on B knees  LOWER EXTREMITY ROM:  Active ROM Right eval Left eval Right 07/04/22 Left 07/04/22  Hip flexion [facility]    Hip extension      Hip abduction [facility]    Hip adduction [facility]    Hip internal rotation      Hip external rotation      Knee flexion 110 125 130 130  Knee extension [facility] WFL 0 0  Ankle dorsiflexion [facility]    Ankle plantarflexion [facility]    Ankle inversion      Ankle eversion       (Blank rows = not tested)  LOWER EXTREMITY MMT:  MMT Right eval Left eval Right 07/04/22 Left 07/04/22  Hip flexion 4- 4+ 5 5  Hip extension   4+ 4+  Hip abduction 3+ 4+ 4+ 5  Hip adduction 4 4+    Hip internal rotation      Hip external rotation      Knee flexion 3+ 4+ 5 5  Knee extension 4 4+ 4+ 5  Ankle dorsiflexion 4+ 4+ 4+ 5  Ankle plantarflexion 4+ 4+    Ankle inversion      Ankle eversion       (Blank rows = not tested)  LOWER EXTREMITY SPECIAL TESTS:  Hip special tests: Marcello Moores test: positive   FUNCTIONAL TESTS:  2 minute walk test: 430 ft  Good static/dynamic  standing balance  GAIT: Distance walked: 430 Assistive device utilized: None Level of assistance: SBA Comments: slight Trendelenburg   TODAY'S TREATMENT:  DATE:   07/08/22 Nustep lv 4 8 min  Tandem on foam 2 x 30" each Rockerboard 2 min PF/ DF; INV/ EV Chair squats 2 x 10 HR x 20  4 inch stairs 5 RT reciprocal HHA x 1   SLS 3 x10" Int HHA (max 6 sec on RLE) Machine walkouts 3 plates x 10 TKE 3 plates x 30   [id number] Nustep lv 4 8 min (during LEFS)  LEFS 2 MWT AROM MMT  Stairs   POC review   07/01/22 Rec bike lv 4 5 min  Tandem on foam 2 x 30" each Chair squats 2 x 10 HR x 20  4 inch step up x 10 each HHA x 1  4 inch power up x 10 HHA x 1 Standing hip abduction 5 lb 2 x10 Standing hip extension 5 lb 2 x 10 Standing march 5lb x20 each  Standing knee flexion 5lb 2 x 10 each  SLS 3 x10" Int HHA (max 8 sec on RLE)  Seated LAQ 5lb 2 x 10 each   PATIENT EDUCATION:  Education details: To continue walking with a cane outdoors Person educated: Patient Education method: Explanation Education comprehension: verbalized understanding  HOME EXERCISE PROGRAM: Access Code: [email or web address] URL: [email or web address]  06/25/22 - Tandem Stance with Support  - 1-2 x daily - 7 x weekly - 1 sets - 3 reps - 30 second hold - Single Leg Stance with Support  - 1-2 x daily - 7 x weekly - 1 sets - 3 reps - 10-15 second hold - Hip Abduction with Resistance Loop  - 1-2 x daily - 7 x weekly - 2 sets - 10 reps - Hip Extension with Resistance Loop  - 1-2 x daily - 7 x weekly - 2 sets - 10 reps  06/19/22 - Seated Hamstring Stretch  - 2 x daily - 7 x weekly - 3 reps - 30 hold - Seated Calf Stretch with Strap  - 2 x daily - 7 x weekly - 3 reps - 30 hold - Mini Squat with Counter Support  - 2 x daily - 7 x weekly - 2 sets - 10 reps - 3 hold Date:  06/10/2022 Prepared by: Josue Hector Exercises - Supine Quad Set  - 2 x daily - 7 x weekly - 2 sets - 10 reps -  5 second hold - Active Straight Leg Raise with Quad Set  - 2 x daily - 7 x weekly - 2 sets - 10 reps - Supine Bridge  - 2 x daily - 7 x weekly - 2 sets - 10 reps - Sidelying Hip Abduction  - 2 x daily - 7 x weekly - 2 sets - 10 reps - Seated Long Arc Quad  - 2 x daily - 7 x weekly - 2 sets - 10 reps - 5 second hold - Heel Raises with Counter Support  - 2 x daily - 7 x weekly - 2 sets - 10 reps - Toe Raises with Counter Support  - 2 x daily - 7 x weekly - 2 sets - 10 reps  ASSESSMENT:  CLINICAL IMPRESSION: Patient remains challenged with balance activity. Added machine walkouts and TKE for improved eccentric quad strength for improved stair ambulating. Patient continues to have difficulty descending stairs without rail. Patient will continue to benefit from skilled therapy services to reduce remaining deficits and improve functional ability.    OBJECTIVE IMPAIRMENTS: Abnormal gait, decreased balance, difficulty walking, decreased ROM, decreased strength, impaired flexibility, and  pain.   ACTIVITY LIMITATIONS: lifting, bending, standing, squatting, stairs, and transfers  PARTICIPATION LIMITATIONS: cleaning, laundry, and community activity  PERSONAL FACTORS: 1 comorbidity: breathing issues  are also affecting patient's functional outcome.   REHAB POTENTIAL: Good  CLINICAL DECISION MAKING: Stable/uncomplicated  EVALUATION COMPLEXITY: Low   GOALS: Goals reviewed with patient? Yes  SHORT TERM GOALS: Target date: 07/03/22 Pt will demonstrate indep in HEP to facilitate carry-over of skilled services and improve functional outcomes Baseline: Goal status: MET  2.  Pt will demonstrate increase in knee flex ROM to 120 to facilitate ease in ambulation Baseline: See AROM  Goal status: MET  3.  Pt will demonstrate increase in LE strength to 4/5 to facilitate ease and safety  in ambulation Baseline: See MMT  Goal status: MET  LONG TERM GOALS: Target date: 07/31/22  Pt will be able to walk independently without AD for > 200 ft Baseline: 430 feet with no AD Goal status: MET  2.  Pt will be able to do stair negotiation (1 flight) without AD Baseline: Can ambulate 7 inch stairs with single rail, good ascending decreased RLE control descending  Goal status: IN PROGRESS  3.  Pt will increase LEFS by at least 9 points in order to demonstrate significant improvement in lower extremity function.  Baseline: 54/80 increased by 10 Goal status: MET  4.  Pt will increase 2MWT by at least 40 ft in order to demonstrate clinically significant improvement in community ambulation Baseline: 430 feet with no AD Goal status: MET  PLAN:  PT FREQUENCY: 1x/week  PT DURATION: 4 weeks  PLANNED INTERVENTIONS: Therapeutic exercises, Therapeutic activity, Neuromuscular re-education, Balance training, Gait training, Patient/Family education, Self Care, Stair training, and Manual therapy  PLAN FOR NEXT SESSION: Continue POC and may progress as tolerated with emphasis on LE strengthening and balance exercises as well as gait training. Gait on uneven services.   8:28 AM, 07/08/22 Josue Hector PT DPT  Physical Therapist with [facility]  [phone]

## 2022-07-09 ENCOUNTER — Ambulatory Visit (HOSPITAL_COMMUNITY)
Admission: RE | Admit: 2022-07-09 | Discharge: 2022-07-09 | Disposition: A | Discharge status: Home / Self Care | Payer: Medicaid Other | Source: Ambulatory Visit | Attending: Pulmonary Disease | Admitting: Pulmonary Disease

## 2022-07-09 ENCOUNTER — Telehealth: Payer: Self-pay | Admitting: Urology

## 2022-07-09 ENCOUNTER — Other Ambulatory Visit (HOSPITAL_COMMUNITY): Payer: Self-pay | Admitting: Obstetrics & Gynecology

## 2022-07-09 DIAGNOSIS — J849 Interstitial pulmonary disease, unspecified: Secondary | ICD-10-CM | POA: Diagnosis not present

## 2022-07-09 DIAGNOSIS — J479 Bronchiectasis, uncomplicated: Secondary | ICD-10-CM | POA: Diagnosis not present

## 2022-07-09 DIAGNOSIS — Z1231 Encounter for screening mammogram for malignant neoplasm of breast: Secondary | ICD-10-CM

## 2022-07-09 DIAGNOSIS — J84112 Idiopathic pulmonary fibrosis: Secondary | ICD-10-CM | POA: Diagnosis not present

## 2022-07-09 NOTE — Telephone Encounter (Addendum)
DOS - 09/04/22  EXOSTECTOMY RIGHT --- [id number]  [facility] MEDICAID   SPOKE WITH PAM WITH [employer] AND SHE STATED THAT FOR CPT CODE 91478 NO PRIOR AUTH IS REQUIRED.  REF # [id number]   08/06/22  SPOKE WITH JASMINE WITH [employer] AND SHE STATED THAT WITH THE NEW SX DATE AND CHANGE OF FACILITY STILL NO PRIOR AUTH IS REQUIRED.  REF # [id number]

## 2022-07-10 ENCOUNTER — Encounter (HOSPITAL_COMMUNITY): Payer: Medicaid Other

## 2022-07-10 ENCOUNTER — Ambulatory Visit: Payer: Medicaid Other | Admitting: Pulmonary Disease

## 2022-07-10 ENCOUNTER — Encounter: Payer: Self-pay | Admitting: Pulmonary Disease

## 2022-07-10 VITALS — BP 138/76 | HR 78 | Temp 97.7°F | Ht 59.0 in | Wt 139.0 lb

## 2022-07-10 DIAGNOSIS — J849 Interstitial pulmonary disease, unspecified: Secondary | ICD-10-CM | POA: Diagnosis not present

## 2022-07-10 NOTE — Patient Instructions (Signed)
I am glad you are stable with your breathing.  I am awaiting for the final read on the CT scan but on my review it appears stable which is good news  Will get high resolution CT and PFTs in 1 year Follow-up in 1 year after these tests.

## 2022-07-10 NOTE — Progress Notes (Addendum)
Latoya Bautista    [id number]    Sep 30, 1970  Primary Care Physician:Hall, Edwinna Areola, MD  Referring Physician: [name], MD [address] [name],  [place] [address]  Chief complaint:  Follow-up for hypersensitivity pneumonitis Exposure to birds and mold   HPI: 52 y.o.  with seizure disorder, hepatitis C, cirrhosis, tongue cancer referred for evaluation of abnormal lung imaging, dyspnea  Complains of dyspnea on exertion for the past 6 months.  No cough, sputum production, wheezing  She has history of childhood ALL with bone marrow transplant x2, whole body radiation Tongue cancer with partial glossectomy on January 18, 2021 with intermittent bleeding.  Her ENT doctor is at [facility].  Recent PET scan did not show any residual cancer but there was concern for bilateral interstitial infiltrates suggestive of interstitial lung disease.  Started on prednisone at 50 mg in the beginning of January 2023 for a CT scan showing changes consistent with hypersensitivity pneumonitis and hypersensitivity panel with positive for pigeon and mold  She got rid of the parakeets in December 2022. Finished prednisone taper in May 2023.  Went on full disability in the summer 2023 and stayed away from work  Pets: Cat, she has dogs and pigeons as pets [she got rid of the pigeons in December 2022] Occupation: Works in housekeeping for the hospital.  Previously worked as a Quarry manager Exposures: No mold, hot tub, Customer service manager.  No feather pillows or comforter Smoking history: Never smoker Travel history: No significant travel history Relevant family history: No family history of lung disease  Interim history: She is doing well with her breathing and here for review of CT scan  Admitted in December 2023 after accidental mechanical fall.  Noted to have AKI, chronic hyponatremia.  Underwent intramedullary nailing on 05/06/2021.  She is doing well with recovery with PT  Outpatient Encounter  Medications as of 07/10/2022  Medication Sig   cholecalciferol (VITAMIN D) 1000 units tablet Take 1,000 Units by mouth daily.   estradiol (ESTRACE) 1 MG tablet Take 1 tablet (1 mg total) by mouth daily.   famotidine (PEPCID) 20 MG tablet Take 1 tablet (20 mg total) by mouth 2 (two) times daily.   fenofibrate (TRICOR) 145 MG tablet Take 145 mg by mouth daily.   folic acid (FOLVITE) 1 MG tablet TAKE 1 TABLET ONCE DAILY.   Garlic 123XX123 MG CAPS Take 1 capsule by mouth daily.   lamoTRIgine (LAMICTAL XR) 50 MG 24 hour tablet Take 3 tablets (150 mg total) by mouth 2 (two) times daily.   levothyroxine (SYNTHROID) 125 MCG tablet Take 0.5 tablets (62.5 mcg total) by mouth daily.   metoprolol tartrate 75 MG TABS Take 75 mg by mouth 2 (two) times daily.   Multiple Vitamins-Minerals (CENTRUM PO) Take 1 tablet by mouth daily.     pantoprazole (PROTONIX) 40 MG tablet Take 1 tablet (40 mg total) by mouth daily. (Patient taking differently: Take 40 mg by mouth daily before breakfast.)   progesterone (PROMETRIUM) 100 MG capsule Take 1 capsule (100 mg total) by mouth daily for 15 days per month.   rosuvastatin (CRESTOR) 40 MG tablet Take 1 tablet (40 mg total) by mouth at bedtime.   sodium bicarbonate 325 MG tablet Take 162.5 mg by mouth 2 (two) times daily.   Teriparatide, Recombinant, (FORTEO) 600 MCG/2.4ML SOPN Inject 1 Dose into the skin daily.   vitamin C (ASCORBIC ACID) 500 MG tablet Take 500 mg by  mouth daily.    Vitamin D, Ergocalciferol, (DRISDOL) 1.25 MG (50000 UNIT) CAPS capsule Take 1 capsule (50,000 Units total) by mouth every OTHER week as directed (Patient taking differently: Take 50,000 Units by mouth See admin instructions. Take 1 capsule (50,000 units) by mouth every other week.)   vitamin E 400 UNIT capsule Take 400 Units by mouth 2 (two) times daily.   [DISCONTINUED] acetaminophen (TYLENOL) 500 MG tablet Take 500-1,000 mg by mouth every 6 (six) hours as needed for moderate pain.   [DISCONTINUED]  apixaban (ELIQUIS) 2.5 MG TABS tablet Take 1 tablet (2.5 mg total) by mouth 2 (two) times daily.   [DISCONTINUED] midodrine (PROAMATINE) 2.5 MG tablet Take 1 tablet (2.5 mg total) by mouth 3 (three) times daily with meals.   [DISCONTINUED] oxyCODONE (OXY IR/ROXICODONE) 5 MG immediate release tablet Take 1 tablet (5 mg total) by mouth every 6 (six) hours as needed for severe pain.   No facility-administered encounter medications on file as of 07/10/2022.   Physical Exam: Blood pressure 138/76, pulse 78, temperature 97.7 F (36.5 C), temperature source Oral, height 4\' 11"  (1.499 m), weight 139 lb (63 kg), SpO2 99 %. Gen:      No acute distress HEENT:  EOMI, sclera anicteric Neck:     No masses; no thyromegaly Lungs:    Clear to auscultation bilaterally; normal respiratory effort CV:         Regular rate and rhythm; no murmurs Abd:      + bowel sounds; soft, non-tender; no palpable masses, no distension Ext:    No edema; adequate peripheral perfusion Skin:      Warm and dry; no rash Neuro: alert and oriented x 3 Psych: normal mood and affect   Data Reviewed: Imaging: PET scan 12/07/2020-emphysematous changes, bilateral groundglass opacities concerning for interstitial lung disease.  High-res CT 04/10/2021-interstitial lung disease and alternate pattern.  NSIP versus chronic hypersensitivity pneumonitis, aortic and coronary atherosclerosis.  High-res CT 10/26/2021-minimal progression of pulmonary fibrosis in alternate pattern.  High-res CT 07/09/2022-stable pulmonary fibrosis and alternate pattern.  Final read is still pending. I have reviewed the images personally.  PFTs: 03/19/2021 FVC 1.01 [34%], FEV1 0.97 [41%], F/F 96, TLC 1.82 [42%], DLCO 4.42 [25%]  04/09/2022 FVC 1.37 [46%], FEV1 1.31 [56%], F/F96, TLC 2.83 [65%], DLCO 9.57 [54%] Severe restriction,  Cardiac: Echocardiogram 07/28/2017 LVEF 0000000, grade 1 diastolic dysfunction.  PA systolic pressure could not be accurately  estimated  Labs: CTD serologies 05/03/2021-negative Hypersensitivity pneumonitis panel 04/30/2021-positive for A pullulans antibody and pigeon serum antibody  Assessment:  Hypersensitivity pneumonitis Likely from patient exposure and mold exposure given positive antibodies for A pullulans and pigeon serum.  She has gotten rid of the birds in December 2022. 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  She is avoiding work exposure by taking disability.  Finished prednisone taper in May 2023 PFTs reviewed with significant improvement in lung volumes and diffusion capacity.  Symptomatically she is feeling better. Awaiting final read on high-res CT but by my review it appears stable  Plan/Recommendations: Follow-up high-res CT and PFTs in 1 year  Marshell Garfinkel MD [facility] 07/10/2022, 8:50 AM  CC: [name], MD   Addendum: Asked to provide preoperative pulmonary evaluation Dr. Celesta Gentile with [facility] to perform a Exostectomy removel of likely osteochondroma  on patient.  Patient needs surgery clearance. Surgery is 3/37/2024.   Patient is at mild increase in risk of perioperative complications due to interstitial lung disease but no absolute contraindication to surgery  Peri-operative Assessment of Pulmonary Risk for Non-Thoracic Surgery:  For Ms. Davids, risk of perioperative pulmonary complications is increased by: Hypersensitivity pneumonitis, interstitial lung disease   Respiratory complications generally occur in 1% of ASA Class I patients, 5% of ASA Class II and 10% of ASA Class III-IV patients These complications rarely result in mortality and iclude postoperative pneumonia, atelectasis, pulmonary embolism, ARDS and increased time requiring postoperative mechanical ventilation.  Overall, I recommend  proceeding with the surgery if the risk for respiratory complications are outweighed by the potential benefits. This will need to be discussed between the patient and surgeon.  To reduce risks of respiratory complications, I recommend: --Pre- and post-operative incentive spirometry performed frequently while awake --Avoiding use of pancuronium during anesthesia.  Marshell Garfinkel MD [place] [facility] See Amion for pager  If no response to pager , please call [phone] until 7pm After 7:00 pm call Elink  [phone] 07/29/2022, 11:56 AM

## 2022-07-15 ENCOUNTER — Ambulatory Visit (HOSPITAL_COMMUNITY): Payer: Medicaid Other | Admitting: Physical Therapy

## 2022-07-15 DIAGNOSIS — R262 Difficulty in walking, not elsewhere classified: Secondary | ICD-10-CM | POA: Diagnosis not present

## 2022-07-15 NOTE — Therapy (Signed)
OUTPATIENT PHYSICAL THERAPY LOWER EXTREMITY TREATMENT   Patient Name: Latoya Bautista MRN: [id number] DOB:10/29/1970, 52 y.o., female Today's Date: 07/15/2022   See note below for Objective Data and Assessment of Progress/Goals.     END OF SESSION:  PT End of Session - 07/15/22 0808     Visit Number 11    Number of Visits 13    Date for PT Re-Evaluation 08/02/22    Authorization Type N Medicaid [employer]    Authorization Time Period 4 approved 1/31- 08/04/22    Authorization - Visit Number 2    Authorization - Number of Visits 4    Progress Note Due on Visit 14    PT Start Time 0820    PT Stop Time 0858    PT Time Calculation (min) 38 min    Activity Tolerance Patient tolerated treatment well    Behavior During Therapy WFL for tasks assessed/performed            Past Medical History:  Diagnosis Date   Anemia    Cancer ([place])    tongue cancer   H/O echocardiogram    a. 07/2017: echo showing EF of 55-60%, Grade 1 DD, and no significant valve abnormalities.    Hepatitis C    Hepatitis C 04/11/2011   Leukemia ([place])    Liver cirrhosis ([place]) 04/11/2011   Osteoporosis    Pulmonary fibrosis (HCC)    Seizures (HCC)    Shingles    Thrombocytopenia ([place]) 04/11/2011   Thyroid disease    Past Surgical History:  Procedure Laterality Date   BONE MARROW BIOPSY  05/07/2011   BONE MARROW TRANSPLANT     BUNIONECTOMY     CHOLECYSTECTOMY     FEMUR IM NAIL Right 05/06/2022   Procedure: INTRAMEDULLARY (IM) NAIL FEMORAL;  Surgeon: Shona Needles, MD;  Location: [facility];  Service: Orthopedics;  Laterality: Right;   liver biopsie     TONGUE BIOPSY     tongue cancer     surgical removal of area   Patient Active Problem List   Diagnosis Date Noted   AKI (acute kidney injury) ([place]) 05/07/2022   Chronic hyponatremia 05/07/2022   Pathological fracture of right femur due to osteoporosis ([place]) 05/07/2022   Femur fracture, right ([place]) 05/05/2022   Malignant neoplasm of tongue, tip and  lateral border ([place]) 05/30/2021   Bone marrow transplant status ([place]) 05/22/2021   ILD (interstitial lung disease) ([place]) 04/18/2021   Squamous cell carcinoma of lateral tongue ([place]) 12/12/2020   Melanoma in situ of skin of buttock ([place]) 08/27/2018   Vulvar lesion 06/09/2018   History of thrombocytopenia 12/11/2015   Well woman exam with routine gynecological exam 10/18/2013   Localization-related epilepsy ([place]) 01/01/2013   Partial epilepsy with impairment of consciousness ([place]) 12/15/2012   Postmenopausal bleeding DUE TO hormone tx. 10/08/2012   History of hepatitis C 04/11/2011   Liver cirrhosis ([place]) 04/11/2011   Thrombocytopenia ([place]) 04/11/2011    PCP: Allyn Kenner MD  REFERRING PROVIDER: Corinne Ports, PA-C  REFERRING DIAG: R femoral shaft fx  THERAPY DIAG:  Difficulty in walking involving lower leg joint  Rationale for Evaluation and Treatment: Rehabilitation  ONSET DATE: 05/05/22  SUBJECTIVE:   SUBJECTIVE STATEMENT: Doing well overall. Still having some trouble going down stairs.   Patient denies any pain at the moment (0/10). However, the R knee hurts at times at night (2/10 on NPRS). Patient reports that the R leg will give way at times. Patient is still having  difficulty bending over. Condition started on 05/05/22, when patient missed the step on the stairs. Patient was taken to the hospital and X-ray was done which revealed R femoral fx. Patient then had surgery 05/06/22 (intramedullary nail femoral). Patient received PT in the hospital and was able to walk with a walker. Patient was then sent to [place] to continue PT for 19 days. Patient is now ambulatory with a quad cane and was sent to outpatient PT evaluation and management.  PERTINENT HISTORY: Osteoporosis, thrombocytopenia, breathing issues  PAIN:  Are you having pain? No  PRECAUTIONS: Fall  WEIGHT BEARING RESTRICTIONS: No  FALLS:  Has patient fallen in last 6 months? Yes. Number of falls  1  LIVING ENVIRONMENT: Lives with: lives with their family and lives with their spouse Lives in: House/apartment Stairs: Yes: Internal: 16 steps; can reach both and External: 3 steps; on left going up Has following equipment at home: Quad cane small base and Grab bars  OCCUPATION: on disability  PLOF: Independent  PATIENT GOALS: "to walk better"  NEXT MD VISIT: 06/25/22  OBJECTIVE:   Ambulatory with quad cane  DIAGNOSTIC FINDINGS:   05/06/22 Femoral X-ray Right femoral fracture repair as above. Alignment is near anatomic by the end of the study.  PATIENT SURVEYS:  LEFS 54/80 (was 44/80)  COGNITION: Overall cognitive status: Within functional limits for tasks assessed     SENSATION: WFL  MUSCLE LENGTH: Mild tightness on B hamstrings, hip flexors Moderate tightness on B piriformis and gastrocsoleus  POSTURE: rounded shoulders and forward head  PALPATION: No tenderness on B knees  LOWER EXTREMITY ROM:  Active ROM Right eval Left eval Right 07/04/22 Left 07/04/22  Hip flexion [facility]    Hip extension      Hip abduction [facility]    Hip adduction [facility]    Hip internal rotation      Hip external rotation      Knee flexion 110 125 130 130  Knee extension [facility] WFL 0 0  Ankle dorsiflexion [facility]    Ankle plantarflexion [facility]    Ankle inversion      Ankle eversion       (Blank rows = not tested)  LOWER EXTREMITY MMT:  MMT Right eval Left eval Right 07/04/22 Left 07/04/22  Hip flexion 4- 4+ 5 5  Hip extension   4+ 4+  Hip abduction 3+ 4+ 4+ 5  Hip adduction 4 4+    Hip internal rotation      Hip external rotation      Knee flexion 3+ 4+ 5 5  Knee extension 4 4+ 4+ 5  Ankle dorsiflexion 4+ 4+ 4+ 5  Ankle plantarflexion 4+ 4+    Ankle inversion      Ankle eversion       (Blank rows = not tested)  LOWER EXTREMITY SPECIAL TESTS:  Hip special tests: Marcello Moores test: positive   FUNCTIONAL TESTS:  2 minute walk test: 430 ft  Good static/dynamic standing  balance  GAIT: Distance walked: 430 Assistive device utilized: None Level of assistance: SBA Comments: slight Trendelenburg   TODAY'S TREATMENT:  DATE:  07/15/22 Nustep lv 5 6 min  Step lunges 2 x 10 lead foot on 7 inch step  Chair squats with heel raise 2 x 10 (eccentric lowering)  7 inch stairs 4 RT reciprocal HHA x 1   SLS 3 x10" Int HHA (max 6 sec on RLE) Machine walkouts 4 plates x 10 TKE 3 plates x 30  Tandem gait on foam 3 RT Sidestepping on foam 3 RT   07/08/22 Nustep lv 4 8 min  Tandem on foam 2 x 30" each Rockerboard 2 min PF/ DF; INV/ EV Chair squats 2 x 10 HR x 20  4 inch stairs 5 RT reciprocal HHA x 1   SLS 3 x10" Int HHA (max 6 sec on RLE) Machine walkouts 3 plates x 10 TKE 3 plates x 30   [id number] Nustep lv 4 8 min (during LEFS)  LEFS 2 MWT AROM MMT  Stairs   POC review   PATIENT EDUCATION:  Education details: To continue walking with a cane outdoors Person educated: Patient Education method: Explanation Education comprehension: verbalized understanding  HOME EXERCISE PROGRAM: Access Code: [email or web address] URL: [email or web address]  06/25/22 - Tandem Stance with Support  - 1-2 x daily - 7 x weekly - 1 sets - 3 reps - 30 second hold - Single Leg Stance with Support  - 1-2 x daily - 7 x weekly - 1 sets - 3 reps - 10-15 second hold - Hip Abduction with Resistance Loop  - 1-2 x daily - 7 x weekly - 2 sets - 10 reps - Hip Extension with Resistance Loop  - 1-2 x daily - 7 x weekly - 2 sets - 10 reps  06/19/22 - Seated Hamstring Stretch  - 2 x daily - 7 x weekly - 3 reps - 30 hold - Seated Calf Stretch with Strap  - 2 x daily - 7 x weekly - 3 reps - 30 hold - Mini Squat with Counter Support  - 2 x daily - 7 x weekly - 2 sets - 10 reps - 3 hold Date: 06/10/2022 Prepared by: Josue Hector Exercises - Supine Quad Set   - 2 x daily - 7 x weekly - 2 sets - 10 reps -  5 second hold - Active Straight Leg Raise with Quad Set  - 2 x daily - 7 x weekly - 2 sets - 10 reps - Supine Bridge  - 2 x daily - 7 x weekly - 2 sets - 10 reps - Sidelying Hip Abduction  - 2 x daily - 7 x weekly - 2 sets - 10 reps - Seated Long Arc Quad  - 2 x daily - 7 x weekly - 2 sets - 10 reps - 5 second hold - Heel Raises with Counter Support  - 2 x daily - 7 x weekly - 2 sets - 10 reps - Toe Raises with Counter Support  - 2 x daily - 7 x weekly - 2 sets - 10 reps  ASSESSMENT:  CLINICAL IMPRESSION: Continued with LE strengthening and balance progressions. Added dynamic balance with narrow stance gait. Patient well challenged with this but improves with practice. Still limited in ability to stand on single leg due to ongoing weakness and balance difficulty. Patient will continue to benefit from skilled therapy services to reduce remaining deficits and improve functional ability.    OBJECTIVE IMPAIRMENTS: Abnormal gait, decreased balance, difficulty walking, decreased ROM, decreased strength, impaired flexibility, and pain.   ACTIVITY LIMITATIONS: lifting, bending, standing, squatting,  stairs, and transfers  PARTICIPATION LIMITATIONS: cleaning, laundry, and community activity  PERSONAL FACTORS: 1 comorbidity: breathing issues  are also affecting patient's functional outcome.   REHAB POTENTIAL: Good  CLINICAL DECISION MAKING: Stable/uncomplicated  EVALUATION COMPLEXITY: Low   GOALS: Goals reviewed with patient? Yes  SHORT TERM GOALS: Target date: 07/03/22 Pt will demonstrate indep in HEP to facilitate carry-over of skilled services and improve functional outcomes Baseline: Goal status: MET  2.  Pt will demonstrate increase in knee flex ROM to 120 to facilitate ease in ambulation Baseline: See AROM  Goal status: MET  3.  Pt will demonstrate increase in LE strength to 4/5 to facilitate ease and safety in ambulation Baseline:  See MMT  Goal status: MET  LONG TERM GOALS: Target date: 07/31/22  Pt will be able to walk independently without AD for > 200 ft Baseline: 430 feet with no AD Goal status: MET  2.  Pt will be able to do stair negotiation (1 flight) without AD Baseline: Can ambulate 7 inch stairs with single rail, good ascending decreased RLE control descending  Goal status: IN PROGRESS  3.  Pt will increase LEFS by at least 9 points in order to demonstrate significant improvement in lower extremity function.  Baseline: 54/80 increased by 10 Goal status: MET  4.  Pt will increase 2MWT by at least 40 ft in order to demonstrate clinically significant improvement in community ambulation Baseline: 430 feet with no AD Goal status: MET  PLAN:  PT FREQUENCY: 1x/week  PT DURATION: 4 weeks  PLANNED INTERVENTIONS: Therapeutic exercises, Therapeutic activity, Neuromuscular re-education, Balance training, Gait training, Patient/Family education, Self Care, Stair training, and Manual therapy  PLAN FOR NEXT SESSION: Continue POC and may progress as tolerated with emphasis on LE strengthening and balance exercises as well as gait training. Continue gait on uneven services.   8:40 AM, 07/15/22 Josue Hector PT DPT  Physical Therapist with [facility]  [phone]

## 2022-07-17 ENCOUNTER — Ambulatory Visit (HOSPITAL_COMMUNITY)
Admission: RE | Admit: 2022-07-17 | Discharge: 2022-07-17 | Disposition: A | Discharge status: Home / Self Care | Payer: Medicaid Other | Source: Ambulatory Visit | Attending: Obstetrics & Gynecology | Admitting: Obstetrics & Gynecology

## 2022-07-17 ENCOUNTER — Encounter (HOSPITAL_COMMUNITY): Payer: Medicaid Other

## 2022-07-17 DIAGNOSIS — Z1231 Encounter for screening mammogram for malignant neoplasm of breast: Secondary | ICD-10-CM | POA: Diagnosis not present

## 2022-07-22 ENCOUNTER — Ambulatory Visit (HOSPITAL_COMMUNITY): Payer: Medicaid Other | Admitting: Physical Therapy

## 2022-07-22 ENCOUNTER — Encounter (HOSPITAL_COMMUNITY): Payer: Self-pay | Admitting: Physical Therapy

## 2022-07-22 DIAGNOSIS — R262 Difficulty in walking, not elsewhere classified: Secondary | ICD-10-CM

## 2022-07-22 NOTE — Therapy (Signed)
OUTPATIENT PHYSICAL THERAPY LOWER EXTREMITY TREATMENT   Patient Name: Latoya Bautista MRN: [id number] DOB:1970-09-05, 52 y.o., female Today's Date: 07/22/2022   See note below for Objective Data and Assessment of Progress/Goals.     END OF SESSION:  PT End of Session - 07/22/22 0814     Visit Number 12    Number of Visits 13    Date for PT Re-Evaluation 08/02/22    Authorization Type N Medicaid [employer]    Authorization Time Period 4 approved 1/31- 08/04/22    Authorization - Visit Number 3    Authorization - Number of Visits 4    Progress Note Due on Visit 14    PT Start Time 0816    PT Stop Time 0856    PT Time Calculation (min) 40 min    Activity Tolerance Patient tolerated treatment well    Behavior During Therapy [facility] for tasks assessed/performed            Past Medical History:  Diagnosis Date   Anemia    Cancer ([place])    tongue cancer   H/O echocardiogram    a. 07/2017: echo showing EF of 55-60%, Grade 1 DD, and no significant valve abnormalities.    Hepatitis C    Hepatitis C 04/11/2011   Leukemia ([place])    Liver cirrhosis ([place]) 04/11/2011   Osteoporosis    Pulmonary fibrosis (HCC)    Seizures (HCC)    Shingles    Thrombocytopenia ([place]) 04/11/2011   Thyroid disease    Past Surgical History:  Procedure Laterality Date   BONE MARROW BIOPSY  05/07/2011   BONE MARROW TRANSPLANT     BUNIONECTOMY     CHOLECYSTECTOMY     FEMUR IM NAIL Right 05/06/2022   Procedure: INTRAMEDULLARY (IM) NAIL FEMORAL;  Surgeon: Shona Needles, MD;  Location: [facility];  Service: Orthopedics;  Laterality: Right;   liver biopsie     TONGUE BIOPSY     tongue cancer     surgical removal of area   Patient Active Problem List   Diagnosis Date Noted   AKI (acute kidney injury) ([place]) 05/07/2022   Chronic hyponatremia 05/07/2022   Pathological fracture of right femur due to osteoporosis ([place]) 05/07/2022   Femur fracture, right ([place]) 05/05/2022   Malignant neoplasm of tongue, tip and  lateral border ([place]) 05/30/2021   Bone marrow transplant status ([place]) 05/22/2021   ILD (interstitial lung disease) ([place]) 04/18/2021   Squamous cell carcinoma of lateral tongue ([place]) 12/12/2020   Melanoma in situ of skin of buttock ([place]) 08/27/2018   Vulvar lesion 06/09/2018   History of thrombocytopenia 12/11/2015   Well woman exam with routine gynecological exam 10/18/2013   Localization-related epilepsy ([place]) 01/01/2013   Partial epilepsy with impairment of consciousness ([place]) 12/15/2012   Postmenopausal bleeding DUE TO hormone tx. 10/08/2012   History of hepatitis C 04/11/2011   Liver cirrhosis ([place]) 04/11/2011   Thrombocytopenia ([place]) 04/11/2011    PCP: Allyn Kenner MD  REFERRING PROVIDER: Corinne Ports, PA-C  REFERRING DIAG: R femoral shaft fx  THERAPY DIAG:  Difficulty in walking involving lower leg joint  Rationale for Evaluation and Treatment: Rehabilitation  ONSET DATE: 05/05/22  SUBJECTIVE:   SUBJECTIVE STATEMENT: No new issues. Still having trouble balancing on one leg.   Patient denies any pain at the moment (0/10). However, the R knee hurts at times at night (2/10 on NPRS). Patient reports that the R leg will give way at times. Patient is still having  difficulty bending over. Condition started on 05/05/22, when patient missed the step on the stairs. Patient was taken to the hospital and X-ray was done which revealed R femoral fx. Patient then had surgery 05/06/22 (intramedullary nail femoral). Patient received PT in the hospital and was able to walk with a walker. Patient was then sent to [place] to continue PT for 19 days. Patient is now ambulatory with a quad cane and was sent to outpatient PT evaluation and management.  PERTINENT HISTORY: Osteoporosis, thrombocytopenia, breathing issues  PAIN:  Are you having pain? No  PRECAUTIONS: Fall  WEIGHT BEARING RESTRICTIONS: No  FALLS:  Has patient fallen in last 6 months? Yes. Number of falls 1  LIVING  ENVIRONMENT: Lives with: lives with their family and lives with their spouse Lives in: House/apartment Stairs: Yes: Internal: 16 steps; can reach both and External: 3 steps; on left going up Has following equipment at home: Quad cane small base and Grab bars  OCCUPATION: on disability  PLOF: Independent  PATIENT GOALS: "to walk better"  NEXT MD VISIT: 06/25/22  OBJECTIVE:   Ambulatory with quad cane  DIAGNOSTIC FINDINGS:   05/06/22 Femoral X-ray Right femoral fracture repair as above. Alignment is near anatomic by the end of the study.  PATIENT SURVEYS:  LEFS 54/80 (was 44/80)  COGNITION: Overall cognitive status: Within functional limits for tasks assessed     SENSATION: WFL  MUSCLE LENGTH: Mild tightness on B hamstrings, hip flexors Moderate tightness on B piriformis and gastrocsoleus  POSTURE: rounded shoulders and forward head  PALPATION: No tenderness on B knees  LOWER EXTREMITY ROM:  Active ROM Right eval Left eval Right 07/04/22 Left 07/04/22  Hip flexion [facility]    Hip extension      Hip abduction [facility]    Hip adduction [facility]    Hip internal rotation      Hip external rotation      Knee flexion 110 125 130 130  Knee extension [facility] WFL 0 0  Ankle dorsiflexion [facility]    Ankle plantarflexion [facility]    Ankle inversion      Ankle eversion       (Blank rows = not tested)  LOWER EXTREMITY MMT:  MMT Right eval Left eval Right 07/04/22 Left 07/04/22  Hip flexion 4- 4+ 5 5  Hip extension   4+ 4+  Hip abduction 3+ 4+ 4+ 5  Hip adduction 4 4+    Hip internal rotation      Hip external rotation      Knee flexion 3+ 4+ 5 5  Knee extension 4 4+ 4+ 5  Ankle dorsiflexion 4+ 4+ 4+ 5  Ankle plantarflexion 4+ 4+    Ankle inversion      Ankle eversion       (Blank rows = not tested)  LOWER EXTREMITY SPECIAL TESTS:  Hip special tests: Marcello Moores test: positive   FUNCTIONAL TESTS:  2 minute walk test: 430 ft  Good static/dynamic standing  balance  GAIT: Distance walked: 430 Assistive device utilized: None Level of assistance: SBA Comments: slight Trendelenburg   TODAY'S TREATMENT:  DATE:  07/22/22 Nustep lv 5 6 min  Chair squats with heel raise 2 x 10 (eccentric lowering)  7 inch step up x20 7 inch step down x20  SLS 3 x10" Int HHA (max 6 sec on RLE) Machine walkouts 4 plates x 10 Machine walkout lateral 2 plates x 5 each  TKE 3 plates x 30  Tandem gait on foam 3 RT Sidestepping on foam 3 RT   07/15/22 Nustep lv 5 6 min  Step lunges 2 x 10 lead foot on 7 inch step  Chair squats with heel raise 2 x 10 (eccentric lowering)  7 inch stairs 4 RT reciprocal HHA x 1   SLS 3 x10" Int HHA (max 6 sec on RLE) Machine walkouts 4 plates x 10 TKE 3 plates x 30  Tandem gait on foam 3 RT Sidestepping on foam 3 RT   07/08/22 Nustep lv 4 8 min  Tandem on foam 2 x 30" each Rockerboard 2 min PF/ DF; INV/ EV Chair squats 2 x 10 HR x 20  4 inch stairs 5 RT reciprocal HHA x 1   SLS 3 x10" Int HHA (max 6 sec on RLE) Machine walkouts 3 plates x 10 TKE 3 plates x 30   PATIENT EDUCATION:  Education details: To continue walking with a cane outdoors Person educated: Patient Education method: Explanation Education comprehension: verbalized understanding  HOME EXERCISE PROGRAM: Access Code: [email or web address] URL: [email or web address]  06/25/22 - Tandem Stance with Support  - 1-2 x daily - 7 x weekly - 1 sets - 3 reps - 30 second hold - Single Leg Stance with Support  - 1-2 x daily - 7 x weekly - 1 sets - 3 reps - 10-15 second hold - Hip Abduction with Resistance Loop  - 1-2 x daily - 7 x weekly - 2 sets - 10 reps - Hip Extension with Resistance Loop  - 1-2 x daily - 7 x weekly - 2 sets - 10 reps  06/19/22 - Seated Hamstring Stretch  - 2 x daily - 7 x weekly - 3 reps - 30 hold - Seated Calf  Stretch with Strap  - 2 x daily - 7 x weekly - 3 reps - 30 hold - Mini Squat with Counter Support  - 2 x daily - 7 x weekly - 2 sets - 10 reps - 3 hold Date: 06/10/2022 Prepared by: Josue Hector Exercises - Supine Quad Set  - 2 x daily - 7 x weekly - 2 sets - 10 reps -  5 second hold - Active Straight Leg Raise with Quad Set  - 2 x daily - 7 x weekly - 2 sets - 10 reps - Supine Bridge  - 2 x daily - 7 x weekly - 2 sets - 10 reps - Sidelying Hip Abduction  - 2 x daily - 7 x weekly - 2 sets - 10 reps - Seated Long Arc Quad  - 2 x daily - 7 x weekly - 2 sets - 10 reps - 5 second hold - Heel Raises with Counter Support  - 2 x daily - 7 x weekly - 2 sets - 10 reps - Toe Raises with Counter Support  - 2 x daily - 7 x weekly - 2 sets - 10 reps  ASSESSMENT:  CLINICAL IMPRESSION: Continued working on LE strength and balance. Patient continues to be most challenged with single leg balance. Strength is improving and stair ambulation is better. Still a little weak in RT quad  with functional activity. Patient will continue to benefit from skilled therapy services to reduce remaining deficits and improve functional ability.    OBJECTIVE IMPAIRMENTS: Abnormal gait, decreased balance, difficulty walking, decreased ROM, decreased strength, impaired flexibility, and pain.   ACTIVITY LIMITATIONS: lifting, bending, standing, squatting, stairs, and transfers  PARTICIPATION LIMITATIONS: cleaning, laundry, and community activity  PERSONAL FACTORS: 1 comorbidity: breathing issues  are also affecting patient's functional outcome.   REHAB POTENTIAL: Good  CLINICAL DECISION MAKING: Stable/uncomplicated  EVALUATION COMPLEXITY: Low   GOALS: Goals reviewed with patient? Yes  SHORT TERM GOALS: Target date: 07/03/22 Pt will demonstrate indep in HEP to facilitate carry-over of skilled services and improve functional outcomes Baseline: Goal status: MET  2.  Pt will demonstrate increase in knee flex ROM to  120 to facilitate ease in ambulation Baseline: See AROM  Goal status: MET  3.  Pt will demonstrate increase in LE strength to 4/5 to facilitate ease and safety in ambulation Baseline: See MMT  Goal status: MET  LONG TERM GOALS: Target date: 07/31/22  Pt will be able to walk independently without AD for > 200 ft Baseline: 430 feet with no AD Goal status: MET  2.  Pt will be able to do stair negotiation (1 flight) without AD Baseline: Can ambulate 7 inch stairs with single rail, good ascending decreased RLE control descending  Goal status: IN PROGRESS  3.  Pt will increase LEFS by at least 9 points in order to demonstrate significant improvement in lower extremity function.  Baseline: 54/80 increased by 10 Goal status: MET  4.  Pt will increase 2MWT by at least 40 ft in order to demonstrate clinically significant improvement in community ambulation Baseline: 430 feet with no AD Goal status: MET  PLAN:  PT FREQUENCY: 1x/week  PT DURATION: 4 weeks  PLANNED INTERVENTIONS: Therapeutic exercises, Therapeutic activity, Neuromuscular re-education, Balance training, Gait training, Patient/Family education, Self Care, Stair training, and Manual therapy  PLAN FOR NEXT SESSION: Reassess next session. Continue POC and may progress as tolerated with emphasis on LE strengthening and balance exercises as well as gait training. Continue gait on uneven services.   8:55 AM, 07/22/22 Josue Hector PT DPT  Physical Therapist with [facility]  [phone]

## 2022-07-24 ENCOUNTER — Encounter (HOSPITAL_COMMUNITY): Payer: Medicaid Other

## 2022-07-25 ENCOUNTER — Encounter: Payer: Self-pay | Admitting: Podiatry

## 2022-07-26 ENCOUNTER — Telehealth: Payer: Self-pay | Admitting: Pulmonary Disease

## 2022-07-26 NOTE — Telephone Encounter (Signed)
Fax received from Dr. Celesta Gentile with [facility] to perform a Exostectomy removel of likely osteochondroma on patient.  Patient needs surgery clearance. Surgery is 3/37/2024. Patient was seen on 07/10/2022. Office protocol is a risk assessment can be sent to surgeon if patient has been seen in 60 days or less.   Sending to Dr Vaughan Browner for risk assessment or recommendations if patient needs to be seen in office prior to surgical procedure.

## 2022-07-29 NOTE — Telephone Encounter (Signed)
I have made an addendum to my note from 07/10/2022.  Please fax this to the surgeon's office.

## 2022-07-29 NOTE — Telephone Encounter (Signed)
Surgery is 07/31/22. Pt's last OV was 07/10/22. Dr. Vaughan Browner please advise.

## 2022-07-29 NOTE — Telephone Encounter (Signed)
ATC [place] left voice mail requesting fax number. Front if you would please ask for fax number. Thank you

## 2022-07-29 NOTE — Telephone Encounter (Signed)
Latoya Bautista from the [facility] is calling again for surgical clearance again. Sx is sched 07/31/22.  Please have Dr. Vaughan Browner send an in basket note or call her w/Questions @ [phone]

## 2022-07-30 ENCOUNTER — Ambulatory Visit (HOSPITAL_COMMUNITY): Payer: Medicaid Other

## 2022-07-30 DIAGNOSIS — R262 Difficulty in walking, not elsewhere classified: Secondary | ICD-10-CM | POA: Diagnosis not present

## 2022-07-30 NOTE — Therapy (Signed)
OUTPATIENT PHYSICAL THERAPY LOWER EXTREMITY PROGRESS/DISCHARGE NOTE   Patient Name: Latoya Bautista MRN: [id number] DOB:01/12/1971, 52 y.o., female Today's Date: 07/30/2022  PHYSICAL THERAPY DISCHARGE SUMMARY  Visits from Start of Care: 13  Current functional level related to goals / functional outcomes: See below   Remaining deficits: See below   Education / Equipment: See below  Patient agrees to discharge. Patient goals were met. Patient is being discharged due to meeting the stated rehab goals.    Progress Note Reporting Period 07/04/22 to 07/30/22  See note below for Objective Data and Assessment of Progress/Goals.     END OF SESSION:  PT End of Session - 07/30/22 0853     Visit Number 13    Number of Visits 13    Date for PT Re-Evaluation 08/02/22    Authorization Type N Medicaid [employer]    Authorization Time Period 4 approved 1/31- 08/04/22    Authorization - Visit Number 4    Authorization - Number of Visits 4    PT Start Time 0820    PT Stop Time 0900    PT Time Calculation (min) 40 min    Equipment Utilized During Treatment Gait belt    Activity Tolerance Patient tolerated treatment well    Behavior During Therapy WFL for tasks assessed/performed            Past Medical History:  Diagnosis Date   Anemia    Cancer ([place])    tongue cancer   H/O echocardiogram    a. 07/2017: echo showing EF of 55-60%, Grade 1 DD, and no significant valve abnormalities.    Hepatitis C    Hepatitis C 04/11/2011   Leukemia ([place])    Liver cirrhosis ([place]) 04/11/2011   Osteoporosis    Pulmonary fibrosis ([facility])    Seizures ([facility])    Shingles    Thrombocytopenia ([place]) 04/11/2011   Thyroid disease    Past Surgical History:  Procedure Laterality Date   BONE MARROW BIOPSY  05/07/2011   BONE MARROW TRANSPLANT     BUNIONECTOMY     CHOLECYSTECTOMY     FEMUR IM NAIL Right 05/06/2022   Procedure: INTRAMEDULLARY (IM) NAIL FEMORAL;  Surgeon: Shona Needles, MD;  Location: [facility];  Service: Orthopedics;  Laterality: Right;   liver biopsie     TONGUE BIOPSY     tongue cancer     surgical removal of area   Patient Active Problem List   Diagnosis Date Noted   AKI (acute kidney injury) ([place]) 05/07/2022   Chronic hyponatremia 05/07/2022   Pathological fracture of right femur due to osteoporosis ([place]) 05/07/2022   Femur fracture, right ([place]) 05/05/2022   Malignant neoplasm of tongue, tip and lateral border ([place]) 05/30/2021   Bone marrow transplant status ([place]) 05/22/2021   ILD (interstitial lung disease) ([place]) 04/18/2021   Squamous cell carcinoma of lateral tongue ([place]) 12/12/2020   Melanoma in situ of skin of buttock ([place]) 08/27/2018   Vulvar lesion 06/09/2018   History of thrombocytopenia 12/11/2015   Well woman exam with routine gynecological exam 10/18/2013   Localization-related epilepsy ([place]) 01/01/2013   Partial epilepsy with impairment of consciousness ([place]) 12/15/2012   Postmenopausal bleeding DUE TO hormone tx. 10/08/2012   History of hepatitis C 04/11/2011   Liver cirrhosis ([place]) 04/11/2011   Thrombocytopenia ([place]) 04/11/2011    PCP: Allyn Kenner MD  REFERRING PROVIDER: Corinne Ports, PA-C  REFERRING DIAG: R femoral shaft fx  THERAPY DIAG:  Difficulty in walking involving  lower leg joint  Rationale for Evaluation and Treatment: Rehabilitation  ONSET DATE: 05/05/22  SUBJECTIVE:   SUBJECTIVE STATEMENT: Denies any pain. Patient states that she's doing well and has been walking and going up/downstairs without issues. Patient thinks that she's done with PT and thinks that PT has helped her a lot. Patient states that she's not having any pain when she puts weight on the R LE. Patient states that she's been walking on her gravel drive way without cane but is she's slow and cautious. Denies falls.  Patient denies any pain at the moment (0/10). However, the R knee hurts at times at night (2/10 on NPRS). Patient reports that the R leg will give  way at times. Patient is still having difficulty bending over. Condition started on 05/05/22, when patient missed the step on the stairs. Patient was taken to the hospital and X-ray was done which revealed R femoral fx. Patient then had surgery 05/06/22 (intramedullary nail femoral). Patient received PT in the hospital and was able to walk with a walker. Patient was then sent to [place] to continue PT for 19 days. Patient is now ambulatory with a quad cane and was sent to outpatient PT evaluation and management.  PERTINENT HISTORY: Osteoporosis, thrombocytopenia, breathing issues  PAIN:  Are you having pain? No  PRECAUTIONS: Fall  WEIGHT BEARING RESTRICTIONS: No  FALLS:  Has patient fallen in last 6 months? Yes. Number of falls 1  LIVING ENVIRONMENT: Lives with: lives with their family and lives with their spouse Lives in: House/apartment Stairs: Yes: Internal: 16 steps; can reach both and External: 3 steps; on left going up Has following equipment at home: Quad cane small base and Grab bars  OCCUPATION: on disability  PLOF: Independent  PATIENT GOALS: "to walk better"  NEXT MD VISIT: 06/25/22  OBJECTIVE:   Ambulatory with quad cane  DIAGNOSTIC FINDINGS:   05/06/22 Femoral X-ray Right femoral fracture repair as above. Alignment is near anatomic by the end of the study.  PATIENT SURVEYS:  LEFS 63/80 from 54/80 from 44/80)  COGNITION: Overall cognitive status: Within functional limits for tasks assessed     SENSATION: WFL  MUSCLE LENGTH: Silght tightness on B hamstrings and gastrocsoleus  POSTURE: rounded shoulders and forward head  PALPATION: No tenderness on B knees  LOWER EXTREMITY ROM:  Active ROM Right eval Left eval Right 07/04/22 Left 07/04/22 Right 07/30/22 Left 07/30/22  Hip flexion [facility]      Hip extension        Hip abduction [facility]      Hip adduction [facility]      Hip internal rotation        Hip external rotation        Knee flexion  110 125 130 130 130 130  Knee extension [facility] WFL 0 0 0 0  Ankle dorsiflexion [facility]      Ankle plantarflexion [facility]      Ankle inversion        Ankle eversion         (Blank rows = not tested)  LOWER EXTREMITY MMT:  MMT Right eval Left eval Right 07/04/22 Left 07/04/22 Right 07/30/22 Left 07/30/22   Hip flexion 4- 4+ 5 5 5 5   Hip extension   4+ 4+ 5 5  Hip abduction 3+ 4+ 4+ 5 5 5   Hip adduction 4 4+      Hip internal rotation        Hip external rotation  Knee flexion 3+ 4+ 5 5 5 5   Knee extension 4 4+ 4+ 5 5 5   Ankle dorsiflexion 4+ 4+ 4+ 5 5 5   Ankle plantarflexion 4+ 4+   5 5  Ankle inversion        Ankle eversion         (Blank rows = not tested)   FUNCTIONAL TESTS:  5 times sit to stand: 9.80 sec 2 minute walk test: 503 ft from 430 ft Tinetti POMA = 28 (balance +  gait)    GAIT: Distance walked: 503 ft Assistive device utilized: None Level of assistance: Complete Independence Comments: no gait deviations seen Also walked on foam surface ~ 5 ft without unsteadiness, no gait deviations seen, no AD  STAIR NEGOTIATION: Patient performed stair negotiation as well (4 steps, 7" high each step) Went up with alternating pattern, no use of railing, without unsteadiness. Went downstairs with alternating pattern, inconsistent use of railing, with little to no unsteadiness.   TODAY'S TREATMENT:                                                                                                                              DATE:  07/30/22 Nustep lv 5, 5 min Progress note (LEFS, Tinetti, 5TSTS, MMT, ROM, 2MWT)  07/22/22 Nustep lv 5 6 min  Chair squats with heel raise 2 x 10 (eccentric lowering)  7 inch step up x20 7 inch step down x20  SLS 3 x10" Int HHA (max 6 sec on RLE) Machine walkouts 4 plates x 10 Machine walkout lateral 2 plates x 5 each  TKE 3 plates x 30  Tandem gait on foam 3 RT Sidestepping on foam 3 RT   07/15/22 Nustep lv 5 6 min  Step lunges 2  x 10 lead foot on 7 inch step  Chair squats with heel raise 2 x 10 (eccentric lowering)  7 inch stairs 4 RT reciprocal HHA x 1   SLS 3 x10" Int HHA (max 6 sec on RLE) Machine walkouts 4 plates x 10 TKE 3 plates x 30  Tandem gait on foam 3 RT Sidestepping on foam 3 RT   07/08/22 Nustep lv 4 8 min  Tandem on foam 2 x 30" each Rockerboard 2 min PF/ DF; INV/ EV Chair squats 2 x 10 HR x 20  4 inch stairs 5 RT reciprocal HHA x 1   SLS 3 x10" Int HHA (max 6 sec on RLE) Machine walkouts 3 plates x 10 TKE 3 plates x 30   PATIENT EDUCATION:  Education details: To D/C cane outdoors with little caution. Continue with HEP Person educated: Patient Education method: Explanation Education comprehension: verbalized understanding  HOME EXERCISE PROGRAM: Access Code: [email or web address] URL: [email or web address]  06/25/22 - Tandem Stance with Support  - 1-2 x daily - 7 x weekly - 1 sets - 3 reps - 30 second hold - Single Leg Stance with Support  - 1-2 x daily - 7  x weekly - 1 sets - 3 reps - 10-15 second hold - Hip Abduction with Resistance Loop  - 1-2 x daily - 7 x weekly - 2 sets - 10 reps - Hip Extension with Resistance Loop  - 1-2 x daily - 7 x weekly - 2 sets - 10 reps  06/19/22 - Seated Hamstring Stretch  - 2 x daily - 7 x weekly - 3 reps - 30 hold - Seated Calf Stretch with Strap  - 2 x daily - 7 x weekly - 3 reps - 30 hold - Mini Squat with Counter Support  - 2 x daily - 7 x weekly - 2 sets - 10 reps - 3 hold Date: 06/10/2022 Prepared by: Josue Hector Exercises - Supine Quad Set  - 2 x daily - 7 x weekly - 2 sets - 10 reps -  5 second hold - Active Straight Leg Raise with Quad Set  - 2 x daily - 7 x weekly - 2 sets - 10 reps - Supine Bridge  - 2 x daily - 7 x weekly - 2 sets - 10 reps - Sidelying Hip Abduction  - 2 x daily - 7 x weekly - 2 sets - 10 reps - Seated Long Arc Quad  - 2 x daily - 7 x weekly - 2 sets - 10 reps - 5 second hold - Heel Raises with Counter Support  - 2  x daily - 7 x weekly - 2 sets - 10 reps - Toe Raises with Counter Support  - 2 x daily - 7 x weekly - 2 sets - 10 reps  ASSESSMENT:  CLINICAL IMPRESSION: Patient demonstrated continued improvements in function as indicated by positive significant changes in 2MWT, LEFS, ROM, and MMT. Patient is not a falls risk as indicated by Tinetti score and 5x sit-to-stand. Patient is also now able to walk on level and uneven surfaces without AD and without unsteadiness and doesn't use the railings constantly with stair negotiation. With this, skilled PT is not required at this time and can be D/C to indep HEP.  OBJECTIVE IMPAIRMENTS: impaired flexibility.   ACTIVITY LIMITATIONS: squatting and stairs  PARTICIPATION LIMITATIONS: community activity  PERSONAL FACTORS: 1 comorbidity: breathing issues  are also affecting patient's functional outcome.   REHAB POTENTIAL: Good  CLINICAL DECISION MAKING: Stable/uncomplicated  EVALUATION COMPLEXITY: Low   GOALS: Goals reviewed with patient? Yes  SHORT TERM GOALS: Target date: 07/03/22 Pt will demonstrate indep in HEP to facilitate carry-over of skilled services and improve functional outcomes Baseline: Goal status: MET  2.  Pt will demonstrate increase in knee flex ROM to 120 to facilitate ease in ambulation Baseline: See AROM  Goal status: MET  3.  Pt will demonstrate increase in LE strength to 4/5 to facilitate ease and safety in ambulation Baseline: See MMT  Goal status: MET  LONG TERM GOALS: Target date: 07/31/22  Pt will be able to walk independently without AD for > 200 ft Baseline: 430 feet with no AD Goal status: MET  2.  Pt will be able to do stair negotiation (1 flight) without AD Baseline: Can ambulate 7 inch stairs with single rail, good ascending decreased RLE control descending  Goal status: MET  3.  Pt will increase LEFS by at least 9 points in order to demonstrate significant improvement in lower extremity function.  Baseline:  54/80 increased by 10 Goal status: MET  4.  Pt will increase 2MWT by at least 40 ft in order to  demonstrate clinically significant improvement in community ambulation Baseline: 430 feet with no AD Goal status: MET  PLAN:  PT FREQUENCY:  0  PT DURATION: other: 0  PLANNED INTERVENTIONS:  Skilled PT not indicated at this time. D/C to indep HEP   8:55 AM, 07/30/22 Harvie Heck. Mouhamad Teed, PT, DPT, OCS Board-Certified Clinical Specialist in [place] # ([place]): [phone] T

## 2022-08-02 ENCOUNTER — Other Ambulatory Visit (HOSPITAL_COMMUNITY): Payer: Self-pay

## 2022-08-05 ENCOUNTER — Other Ambulatory Visit: Payer: Medicaid Other

## 2022-08-05 DIAGNOSIS — Z419 Encounter for procedure for purposes other than remedying health state, unspecified: Secondary | ICD-10-CM | POA: Diagnosis not present

## 2022-08-12 DIAGNOSIS — C021 Malignant neoplasm of border of tongue: Secondary | ICD-10-CM | POA: Diagnosis not present

## 2022-08-12 DIAGNOSIS — E782 Mixed hyperlipidemia: Secondary | ICD-10-CM | POA: Diagnosis not present

## 2022-08-12 DIAGNOSIS — E039 Hypothyroidism, unspecified: Secondary | ICD-10-CM | POA: Diagnosis not present

## 2022-08-12 DIAGNOSIS — D169 Benign neoplasm of bone and articular cartilage, unspecified: Secondary | ICD-10-CM | POA: Diagnosis not present

## 2022-08-12 DIAGNOSIS — M80051D Age-related osteoporosis with current pathological fracture, right femur, subsequent encounter for fracture with routine healing: Secondary | ICD-10-CM | POA: Diagnosis not present

## 2022-08-12 DIAGNOSIS — S68011D Complete traumatic metacarpophalangeal amputation of right thumb, subsequent encounter: Secondary | ICD-10-CM | POA: Diagnosis not present

## 2022-08-12 DIAGNOSIS — Z01818 Encounter for other preprocedural examination: Secondary | ICD-10-CM | POA: Diagnosis not present

## 2022-08-12 DIAGNOSIS — J849 Interstitial pulmonary disease, unspecified: Secondary | ICD-10-CM | POA: Diagnosis not present

## 2022-08-12 DIAGNOSIS — S61101D Unspecified open wound of right thumb with damage to nail, subsequent encounter: Secondary | ICD-10-CM | POA: Diagnosis not present

## 2022-08-12 DIAGNOSIS — G40909 Epilepsy, unspecified, not intractable, without status epilepticus: Secondary | ICD-10-CM | POA: Diagnosis not present

## 2022-08-12 DIAGNOSIS — K219 Gastro-esophageal reflux disease without esophagitis: Secondary | ICD-10-CM | POA: Diagnosis not present

## 2022-08-13 ENCOUNTER — Other Ambulatory Visit: Payer: Medicaid Other

## 2022-08-22 ENCOUNTER — Encounter (HOSPITAL_BASED_OUTPATIENT_CLINIC_OR_DEPARTMENT_OTHER): Payer: Self-pay | Admitting: Podiatry

## 2022-08-23 ENCOUNTER — Encounter (HOSPITAL_BASED_OUTPATIENT_CLINIC_OR_DEPARTMENT_OTHER): Payer: Self-pay | Admitting: Podiatry

## 2022-08-26 ENCOUNTER — Encounter: Payer: Medicaid Other | Admitting: Podiatry

## 2022-08-26 ENCOUNTER — Encounter (HOSPITAL_BASED_OUTPATIENT_CLINIC_OR_DEPARTMENT_OTHER): Payer: Self-pay | Admitting: Podiatry

## 2022-08-26 NOTE — Progress Notes (Signed)
Spoke w/ via phone for pre-op interview--- pt Lab needs dos----  [facility] results------ current EKG in epic/ chart COVID test -----patient states asymptomatic no test needed Arrive at -------  0630 on 09-04-2022 NPO after MN NO Solid Food.  Clear liquids from MN until--- 0530 Med rec completed Medications to take morning of surgery ----- synthroid, metroprolol, lamotrigine, pepcid, protonix Diabetic medication ----- n/a Patient instructed no nail polish to be worn day of surgery Patient instructed to bring photo id and insurance card day of surgery Patient aware to have Driver (ride ) / caregiver    for 24 hours after surgery -- husband, david Patient Special Instructions ----- n/a Pre-Op special Instructions ----- received pt's pcp, Dr Herma Carson. Margo Aye, H&P dated 08-12-2022 via fax from Dr Ardelle Anton, placed w/ chart.  Also, pt has pulmonary clearance by Dr Isaiah Serge (dated addendum 07-29-2022 to office visit in epic 07-10-2022) in epic/ chart Patient verbalized understanding of instructions that were given at this phone interview. Patient denies shortness of breath, chest pain, fever, cough at this phone interview.   Anesthesia Review:  HTN;  ILD secondary hypersensitivity pneumonitis;  Seizure disorder ( since age 86 , pt stated is has been yrs since last one);  hx ALL in remission (age 74 chemoradiation, stem cell transplant x2);  well compensated Cirrhosis without ascites secondary cured HVC; S/p partial glossectomy (less than half, right lateral) for recurrent SCC  Spoke anesthesia, Dr Richardson Landry MDA,  ok to proceed at [facility], will do mostly regional.  PCP:  Dr Herma Carson. Margo Aye Cardiologist :  Dr Dominga Ferry [facility] 11-26-2021) Pulmonary:  Dr Isaiah Serge [name] 07-10-2022) Neurologist:  Dr Corrinne Eagle Theron Arista 06-04-2022 care everywhere) Oncologist:  Dr Ellin Saba ([place] 11-14-2021) GI:  Rica Records [facility] 07-03-2022 care everywhere) ENT:  Dr Azucena Fallen [facility] 06-18-2022 care everywhere) Chest x-ray :  [facility]  07-09-2022 EKG : 11-26-2021 Echo : 12-12-2021 Stress test: no Cardiac Cath : no Activity level: denies sob w/ normal activity Sleep Study/ CPAP : no Blood Thinner/ Instructions /Last Dose: no ASA / Instructions/ Last Dose : no

## 2022-08-27 ENCOUNTER — Telehealth: Payer: Self-pay

## 2022-08-27 NOTE — Telephone Encounter (Signed)
..  Patient declines further follow up and engagement by the Managed Medicaid Team. Appropriate care team members and provider have been notified via electronic communication. The Managed Medicaid Team is available to follow up with the patient after provider conversation with the patient regarding recommendation for engagement and subsequent re-referral to the Managed Medicaid Team.     Lorijean Husser Care Guide  Medicaid Managed  [place]  [phone]  

## 2022-09-04 ENCOUNTER — Encounter (HOSPITAL_BASED_OUTPATIENT_CLINIC_OR_DEPARTMENT_OTHER)
Admission: RE | Disposition: A | Discharge status: Home / Self Care | Payer: Self-pay | Source: Home / Self Care | Attending: Podiatry

## 2022-09-04 ENCOUNTER — Ambulatory Visit (HOSPITAL_BASED_OUTPATIENT_CLINIC_OR_DEPARTMENT_OTHER): Payer: Medicaid Other | Admitting: Anesthesiology

## 2022-09-04 ENCOUNTER — Other Ambulatory Visit: Payer: Self-pay

## 2022-09-04 ENCOUNTER — Ambulatory Visit (HOSPITAL_BASED_OUTPATIENT_CLINIC_OR_DEPARTMENT_OTHER): Payer: Medicaid Other

## 2022-09-04 ENCOUNTER — Encounter: Payer: Self-pay | Admitting: Podiatry

## 2022-09-04 ENCOUNTER — Encounter (HOSPITAL_BASED_OUTPATIENT_CLINIC_OR_DEPARTMENT_OTHER): Payer: Self-pay | Admitting: Podiatry

## 2022-09-04 ENCOUNTER — Ambulatory Visit (HOSPITAL_BASED_OUTPATIENT_CLINIC_OR_DEPARTMENT_OTHER)
Admission: RE | Admit: 2022-09-04 | Discharge: 2022-09-04 | Disposition: A | Discharge status: Home / Self Care | Payer: Medicaid Other | Attending: Podiatry | Admitting: Podiatry

## 2022-09-04 DIAGNOSIS — M899 Disorder of bone, unspecified: Secondary | ICD-10-CM | POA: Diagnosis not present

## 2022-09-04 DIAGNOSIS — Z419 Encounter for procedure for purposes other than remedying health state, unspecified: Secondary | ICD-10-CM | POA: Diagnosis not present

## 2022-09-04 DIAGNOSIS — I10 Essential (primary) hypertension: Secondary | ICD-10-CM | POA: Insufficient documentation

## 2022-09-04 DIAGNOSIS — K7460 Unspecified cirrhosis of liver: Secondary | ICD-10-CM | POA: Diagnosis not present

## 2022-09-04 DIAGNOSIS — E039 Hypothyroidism, unspecified: Secondary | ICD-10-CM | POA: Diagnosis not present

## 2022-09-04 DIAGNOSIS — Z79899 Other long term (current) drug therapy: Secondary | ICD-10-CM | POA: Insufficient documentation

## 2022-09-04 DIAGNOSIS — M898X7 Other specified disorders of bone, ankle and foot: Secondary | ICD-10-CM

## 2022-09-04 DIAGNOSIS — D638 Anemia in other chronic diseases classified elsewhere: Secondary | ICD-10-CM | POA: Diagnosis not present

## 2022-09-04 DIAGNOSIS — M25774 Osteophyte, right foot: Secondary | ICD-10-CM | POA: Diagnosis not present

## 2022-09-04 DIAGNOSIS — Z01818 Encounter for other preprocedural examination: Secondary | ICD-10-CM

## 2022-09-04 DIAGNOSIS — I2510 Atherosclerotic heart disease of native coronary artery without angina pectoris: Secondary | ICD-10-CM | POA: Insufficient documentation

## 2022-09-04 DIAGNOSIS — K219 Gastro-esophageal reflux disease without esophagitis: Secondary | ICD-10-CM | POA: Insufficient documentation

## 2022-09-04 DIAGNOSIS — D649 Anemia, unspecified: Secondary | ICD-10-CM | POA: Diagnosis not present

## 2022-09-04 DIAGNOSIS — R569 Unspecified convulsions: Secondary | ICD-10-CM | POA: Insufficient documentation

## 2022-09-04 HISTORY — DX: Gastro-esophageal reflux disease without esophagitis: K21.9

## 2022-09-04 HISTORY — DX: Hypothyroidism, unspecified: E03.9

## 2022-09-04 HISTORY — DX: Primary amenorrhea: N91.0

## 2022-09-04 HISTORY — DX: Mixed hyperlipidemia: E78.2

## 2022-09-04 HISTORY — PX: EXOSTECTECTOMY TOE: SHX6618

## 2022-09-04 HISTORY — DX: Interstitial pulmonary disease, unspecified: J84.9

## 2022-09-04 HISTORY — DX: Other seasonal allergic rhinitis: J30.2

## 2022-09-04 HISTORY — DX: Stem cells transplant status: Z94.84

## 2022-09-04 HISTORY — DX: Hypersensitivity pneumonitis due to unspecified organic dust: J67.9

## 2022-09-04 HISTORY — DX: Personal history of antineoplastic chemotherapy: Z92.21

## 2022-09-04 HISTORY — DX: Personal history of irradiation: Z92.3

## 2022-09-04 HISTORY — DX: Essential (primary) hypertension: I10

## 2022-09-04 HISTORY — DX: Cardiac murmur, unspecified: R01.1

## 2022-09-04 HISTORY — DX: Unspecified cirrhosis of liver: K74.60

## 2022-09-04 HISTORY — DX: Atherosclerotic heart disease of native coronary artery without angina pectoris: I25.10

## 2022-09-04 HISTORY — DX: Other forms of dyspnea: R06.09

## 2022-09-04 HISTORY — DX: Palpitations: R00.2

## 2022-09-04 LAB — POCT I-STAT, CHEM 8
BUN: 17 mg/dL (ref 6–20)
Calcium, Ion: 1.31 mmol/L (ref 1.15–1.40)
Chloride: 99 mmol/L (ref 98–111)
Creatinine, Ser: 0.9 mg/dL (ref 0.44–1.00)
Glucose, Bld: 105 mg/dL — ABNORMAL HIGH (ref 70–99)
HCT: 40 % (ref 36.0–46.0)
Hemoglobin: 13.6 g/dL (ref 12.0–15.0)
Potassium: 4.4 mmol/L (ref 3.5–5.1)
Sodium: 135 mmol/L (ref 135–145)
TCO2: 27 mmol/L (ref 22–32)

## 2022-09-04 SURGERY — EXCISION, EXOSTOSIS, TOE
Anesthesia: Monitor Anesthesia Care | Site: Toe | Laterality: Right

## 2022-09-04 MED ORDER — MIDAZOLAM HCL 2 MG/2ML IJ SOLN
INTRAMUSCULAR | Status: AC
  Filled 2022-09-04: qty 2

## 2022-09-04 MED ORDER — PROPOFOL 500 MG/50ML IV EMUL
INTRAVENOUS | Status: DC | PRN
  Administered 2022-09-04: 200 ug/kg/min via INTRAVENOUS

## 2022-09-04 MED ORDER — CEFAZOLIN SODIUM-DEXTROSE 2-4 GM/100ML-% IV SOLN
2.0000 g | INTRAVENOUS | Status: AC
  Administered 2022-09-04: 2 g via INTRAVENOUS

## 2022-09-04 MED ORDER — CEFAZOLIN SODIUM-DEXTROSE 2-4 GM/100ML-% IV SOLN
INTRAVENOUS | Status: AC
  Filled 2022-09-04: qty 100

## 2022-09-04 MED ORDER — LACTATED RINGERS IV SOLN: INTRAVENOUS | Status: DC

## 2022-09-04 MED ORDER — CHLORHEXIDINE GLUCONATE CLOTH 2 % EX PADS: 6.0000 | MEDICATED_PAD | Freq: Once | CUTANEOUS | Status: DC

## 2022-09-04 MED ORDER — PROPOFOL 10 MG/ML IV BOLUS
INTRAVENOUS | Status: AC
  Filled 2022-09-04: qty 20

## 2022-09-04 MED ORDER — 0.9 % SODIUM CHLORIDE (POUR BTL) OPTIME
TOPICAL | Status: DC | PRN
  Administered 2022-09-04: 500 mL

## 2022-09-04 MED ORDER — PROMETHAZINE HCL 25 MG PO TABS: 25.0000 mg | ORAL_TABLET | Freq: Three times a day (TID) | ORAL | 0 refills | Status: DC | PRN

## 2022-09-04 MED ORDER — HYDROCODONE-ACETAMINOPHEN 5-325 MG PO TABS: 1.0000 | ORAL_TABLET | Freq: Four times a day (QID) | ORAL | 0 refills | Status: DC | PRN

## 2022-09-04 MED ORDER — ACETAMINOPHEN 500 MG PO TABS
1000.0000 mg | ORAL_TABLET | Freq: Once | ORAL | Status: AC
  Administered 2022-09-04: 1000 mg via ORAL

## 2022-09-04 MED ORDER — PROPOFOL 1000 MG/100ML IV EMUL
INTRAVENOUS | Status: AC
  Filled 2022-09-04: qty 100

## 2022-09-04 MED ORDER — CELECOXIB 200 MG PO CAPS
ORAL_CAPSULE | ORAL | Status: AC
  Filled 2022-09-04: qty 1

## 2022-09-04 MED ORDER — ACETAMINOPHEN 500 MG PO TABS
ORAL_TABLET | ORAL | Status: AC
  Filled 2022-09-04: qty 2

## 2022-09-04 MED ORDER — FENTANYL CITRATE (PF) 100 MCG/2ML IJ SOLN
INTRAMUSCULAR | Status: AC
  Filled 2022-09-04: qty 2

## 2022-09-04 MED ORDER — BUPIVACAINE HCL (PF) 0.5 % IJ SOLN
INTRAMUSCULAR | Status: DC | PRN
  Administered 2022-09-04: 5 mL

## 2022-09-04 MED ORDER — FENTANYL CITRATE (PF) 100 MCG/2ML IJ SOLN
INTRAMUSCULAR | Status: DC | PRN
  Administered 2022-09-04: 50 ug via INTRAVENOUS

## 2022-09-04 MED ORDER — MIDAZOLAM HCL 2 MG/2ML IJ SOLN
INTRAMUSCULAR | Status: DC | PRN
  Administered 2022-09-04: 1 mg via INTRAVENOUS

## 2022-09-04 MED ORDER — CEPHALEXIN 500 MG PO CAPS: 500.0000 mg | ORAL_CAPSULE | Freq: Three times a day (TID) | ORAL | 0 refills | Status: DC

## 2022-09-04 MED ORDER — CELECOXIB 200 MG PO CAPS
200.0000 mg | ORAL_CAPSULE | Freq: Once | ORAL | Status: AC
  Administered 2022-09-04: 200 mg via ORAL

## 2022-09-04 MED ORDER — LIDOCAINE HCL (PF) 2 % IJ SOLN
INTRAMUSCULAR | Status: DC | PRN
  Administered 2022-09-04: 5 mL via INTRADERMAL

## 2022-09-04 MED ORDER — LIDOCAINE HCL (PF) 2 % IJ SOLN
INTRAMUSCULAR | Status: AC
  Filled 2022-09-04: qty 5

## 2022-09-04 SURGICAL SUPPLY — 52 items
BAG DECANTER FOR FLEXI CONT (MISCELLANEOUS) ×1 IMPLANT
BAG SPEC THK2 15X12 ZIP CLS (MISCELLANEOUS)
BAG ZIPLOCK 12X15 (MISCELLANEOUS) ×1 IMPLANT
BLADE OSCILLATING SAW SHORT (MISCELLANEOUS) IMPLANT
BLADE SURG 10 STRL SS (BLADE) ×2 IMPLANT
BNDG CMPR 5X4 KNIT ELC UNQ LF (GAUZE/BANDAGES/DRESSINGS) ×1
BNDG ELASTIC 4INX 5YD STR LF (GAUZE/BANDAGES/DRESSINGS) ×1 IMPLANT
BNDG GAUZE DERMACEA FLUFF 4 (GAUZE/BANDAGES/DRESSINGS) ×1 IMPLANT
BNDG GZE DERMACEA 4 6PLY (GAUZE/BANDAGES/DRESSINGS) ×1
COVER SURGICAL LIGHT HANDLE (MISCELLANEOUS) ×1 IMPLANT
CUFF TOURN SGL QUICK 18X4 (TOURNIQUET CUFF) ×1 IMPLANT
DRAPE C-ARM 35X43 STRL (DRAPES) IMPLANT
DRAPE EXTREMITY T 121X128X90 (DISPOSABLE) ×1 IMPLANT
DRAPE SURG 17X23 STRL (DRAPES) ×1 IMPLANT
ELECT REM PT RETURN 9FT ADLT (ELECTROSURGICAL) ×1
ELECTRODE REM PT RTRN 9FT ADLT (ELECTROSURGICAL) ×1 IMPLANT
GAUZE 4X4 16PLY ~~LOC~~+RFID DBL (SPONGE) ×1 IMPLANT
GAUZE PACKING IODOFORM 1/4X15 (PACKING) IMPLANT
GAUZE PAD ABD 8X10 STRL (GAUZE/BANDAGES/DRESSINGS) ×1 IMPLANT
GAUZE SPONGE 4X4 12PLY STRL (GAUZE/BANDAGES/DRESSINGS) ×1 IMPLANT
GAUZE XEROFORM 1X8 LF (GAUZE/BANDAGES/DRESSINGS) IMPLANT
GLOVE BIO SURGEON STRL SZ7.5 (GLOVE) ×1 IMPLANT
GLOVE BIOGEL PI IND STRL 7.5 (GLOVE) IMPLANT
GLOVE BIOGEL PI IND STRL 8 (GLOVE) ×1 IMPLANT
GOWN STRL REUS W/TWL LRG LVL3 (GOWN DISPOSABLE) IMPLANT
GOWN STRL REUS W/TWL XL LVL3 (GOWN DISPOSABLE) ×1 IMPLANT
KIT TURNOVER CYSTO (KITS) ×1 IMPLANT
MANIFOLD NEPTUNE II (INSTRUMENTS) ×1 IMPLANT
NDL HYPO 22X1.5 SAFETY MO (MISCELLANEOUS) IMPLANT
NEEDLE HYPO 22X1.5 SAFETY MO (MISCELLANEOUS) IMPLANT
NS IRRIG 1000ML POUR BTL (IV SOLUTION) ×1 IMPLANT
NS IRRIG 500ML POUR BTL (IV SOLUTION) IMPLANT
PACK BASIN DAY SURGERY FS (CUSTOM PROCEDURE TRAY) ×1 IMPLANT
PACK ORTHO EXTREMITY (CUSTOM PROCEDURE TRAY) ×1 IMPLANT
PAD CAST 4YDX4 CTTN HI CHSV (CAST SUPPLIES) IMPLANT
PADDING CAST COTTON 4X4 STRL (CAST SUPPLIES)
PENCIL SMOKE EVACUATOR (MISCELLANEOUS) IMPLANT
PROTECTOR NERVE ULNAR (MISCELLANEOUS) ×1 IMPLANT
SLEEVE SCD COMPRESS KNEE MED (STOCKING) ×1 IMPLANT
SOL SCRUB PVP POV-IOD 4OZ 7.5% (MISCELLANEOUS) ×1
SOL TOPI POVIDONE IODINE PAINT (MISCELLANEOUS) ×1 IMPLANT
SOLUTION SCRB POV-IOD 4OZ 7.5% (MISCELLANEOUS) ×1 IMPLANT
SPIKE FLUID TRANSFER (MISCELLANEOUS) ×1 IMPLANT
SUT MNCRL AB 4-0 PS2 18 (SUTURE) IMPLANT
SUT PROLENE 3 0 PS 2 (SUTURE) ×1 IMPLANT
SWAB COLLECTION DEVICE MRSA (MISCELLANEOUS) IMPLANT
SWAB CULTURE ESWAB REG 1ML (MISCELLANEOUS) IMPLANT
SYR 20ML LL LF (SYRINGE) IMPLANT
SYR CONTROL 10ML LL (SYRINGE) IMPLANT
TOWEL OR 17X24 6PK STRL BLUE (TOWEL DISPOSABLE) ×1 IMPLANT
WATER STERILE IRR 1000ML POUR (IV SOLUTION) ×1 IMPLANT
YANKAUER SUCT BULB TIP NO VENT (SUCTIONS) ×1 IMPLANT

## 2022-09-04 NOTE — Anesthesia Procedure Notes (Signed)
Procedure Name: MAC Date/Time: 09/04/2022 8:35 AM  Performed by: Francie Massing, CRNAPre-anesthesia Checklist: Patient identified, Emergency Drugs available, Suction available, Patient being monitored and Timeout performed Patient Re-evaluated:Patient Re-evaluated prior to induction Oxygen Delivery Method: Simple face mask

## 2022-09-04 NOTE — H&P (Signed)
  Patient seen and evaluated in pre-op with husband present. We again discussed exostectomy/removal of bone lesion to the RIGHT big toe. We discussed the surgery and postop course. All alternatives, risks, complications discussed. No further questions or concerns. Consent signed. Full H&P from PCP in chart.

## 2022-09-04 NOTE — Transfer of Care (Signed)
Immediate Anesthesia Transfer of Care Note  Patient: Latoya Bautista  Procedure(s) Performed: Procedure(s) (LRB): EXOSTECTECTOMY TOE (Right)  Patient Location: PACU  Anesthesia Type: MAC  Level of Consciousness: awake, alert , oriented and patient cooperative  Airway & Oxygen Therapy: Patient Spontanous Breathing and Patient connected to face mask oxygen  Post-op Assessment: Report given to PACU RN and Post -op Vital signs reviewed and stable  Post vital signs: Reviewed and stable  Complications: No apparent anesthesia complications   Last Vitals:  Vitals Value Taken Time  BP 111/56 09/04/22 0917  Temp 36.6 C 09/04/22 0917  Pulse 67 09/04/22 0925  Resp 21 09/04/22 0925  SpO2 96 % 09/04/22 0925  Vitals shown include unvalidated device data.  Last Pain:  Vitals:   09/04/22 0917  TempSrc:   PainSc: 0-No pain      Patients Stated Pain Goal: 3 (09/04/22 0706)  Complications: No notable events documented.

## 2022-09-04 NOTE — Op Note (Signed)
  PATIENT:  Latoya Bautista  52 y.o. female  PRE-OPERATIVE DIAGNOSIS:  Exostosis of right toe  POST-OPERATIVE DIAGNOSIS:  Exostosis of right toe  PROCEDURE:  Procedure(s): EXOSTECTECTOMY TOE (Right)  SURGEON:  Surgeon(s) and Role:    * Vivi Barrack, DPM - Primary  PHYSICIAN ASSISTANT:   ASSISTANTS: none   ANESTHESIA:   MAC  EBL:  minimal   BLOOD ADMINISTERED:none  DRAINS: none   LOCAL MEDICATIONS USED:  MARCAINE   , BUPIVICAINE , and Amount: 10 ml  SPECIMEN:  Source of Specimen:  bone right big toe  DISPOSITION OF SPECIMEN:  PATHOLOGY  COUNTS:  YES  TOURNIQUET:   Total Tourniquet Time Documented: Calf (Right) - 14 minutes Total: Calf (Right) - 14 minutes   DICTATION: .Reubin Milan Dictation  PLAN OF CARE: Discharge to home after PACU  PATIENT DISPOSITION:  PACU - hemodynamically stable.   Delay start of Pharmacological VTE agent (>24hrs) due to surgical blood loss or risk of bleeding: no  Indications for surgery: 52 year old female presented the office with concerns of recurrent ingrown toenail.  X-rays were performed which revealed exostosis of the right hallux.  Given her ongoing nature of her symptoms we discussed exostectomy to have decreased pain.  We discussed the procedure as well as postoperative course.  Alternatives, risks, complications were discussed.  No promises or guarantees were given as the outcome of the procedure and all questions were answered to the best my ability.  Procedure in detail: The patient was both verbally and visually identified myself, nursing staff, the anesthesia staff preoperatively.  She was then transferred to the operating room via stretcher and placed on the operative table in supine position.  A well-padded below-knee calf tourniquet was applied making sure to pad all bony prominences.  After adequate plane of anesthesia was obtained a timeout was performed and mixture of lidocaine, Marcaine plain for total of 10 mL was  infiltrated in a regional block fashion.  The right lower extremities and scrubbed, prepped, draped in normal sterile fashion.  Secondary timeout performed.  Right lower extremities exsanguinated and Esmarch bandage and pneumatic calf tourniquet was inflated to 250 mmHg.  Transverse incision was made the distal aspect the right hallux with a 15 blade scalpel.  The incision was carried down to the bone and soft tissue structures were freed from the distal phalanx.  Once this was freed I utilized a sagittal saw as well as a rasp in order to smooth the dorsal aspect of the distal phalanx.  Fluoroscopy was utilized to confirm adequate resection.  I did send the bone to pathology.  No signs of infection.  Clinically there is no palpable prominence noted.  I copiously irrigated with saline hemostasis achieved.  Incision was closed with 4-0 Monocryl as well as 3-0 Prolene.  Tourniquet was released To be an immediate cap refill time to the digit and good circulation noted.  Xeroform was applied followed by dry sterile dressing.  She was awoke from anesthesia and found to tolerate the procedure well with any complications.  Transferred to PACU vital sign stable and vascular status intact.  Patient be discharged with weightbearing as tolerated in surgical shoe.  Ice, elevation.  Follow-up as scheduled.

## 2022-09-04 NOTE — Anesthesia Preprocedure Evaluation (Signed)
Anesthesia Evaluation  Patient identified by MRN, date of birth, ID band Patient awake    Reviewed: Allergy & Precautions, NPO status , Patient's Chart, lab work & pertinent test results  Airway Mallampati: II  TM Distance: >3 FB Neck ROM: Full    Dental no notable dental hx.    Pulmonary neg pulmonary ROS   Pulmonary exam normal        Cardiovascular hypertension, Pt. on medications and Pt. on home beta blockers + CAD and + DOE   Rhythm:Regular Rate:Normal  ECHO 08/23:    1. Left ventricular ejection fraction, by estimation, is 60 to 65%. Left  ventricular ejection fraction by 2D MOD biplane is 62.0 %. The left  ventricle has normal function. The left ventricle has no regional wall  motion abnormalities. Indeterminate  diastolic filling due to E-A fusion.   2. Right ventricular systolic function is normal. The right ventricular  size is normal.   3. The mitral valve is grossly normal. Trivial mitral valve  regurgitation. No evidence of mitral stenosis.   4. The aortic valve is tricuspid. Aortic valve regurgitation is trivial.  No aortic stenosis is present.     Neuro/Psych Seizures -, Well Controlled,   negative psych ROS   GI/Hepatic ,GERD  Medicated,,(+) Cirrhosis       , Hepatitis -, C  Endo/Other  Hypothyroidism    Renal/GU   negative genitourinary   Musculoskeletal negative musculoskeletal ROS (+)    Abdominal Normal abdominal exam  (+)   Peds  Hematology  (+) Blood dyscrasia, anemia   Anesthesia Other Findings   Reproductive/Obstetrics                             Anesthesia Physical Anesthesia Plan  ASA: 3  Anesthesia Plan: MAC   Post-op Pain Management:    Induction: Intravenous  PONV Risk Score and Plan: 2 and Ondansetron, Dexamethasone, Midazolam and Treatment may vary due to age or medical condition  Airway Management Planned: Simple Face Mask and Nasal  Cannula  Additional Equipment: None  Intra-op Plan:   Post-operative Plan:   Informed Consent: I have reviewed the patients History and Physical, chart, labs and discussed the procedure including the risks, benefits and alternatives for the proposed anesthesia with the patient or authorized representative who has indicated his/her understanding and acceptance.     Dental advisory given  Plan Discussed with: CRNA  Anesthesia Plan Comments:        Anesthesia Quick Evaluation

## 2022-09-04 NOTE — Discharge Instructions (Addendum)
Wear surgical shoe at all times Resume all home medications as before surgery See written instructions    Post Anesthesia Home Care Instructions  Activity: Get plenty of rest for the remainder of the day. A responsible individual must stay with you for 24 hours following the procedure.  For the next 24 hours, DO NOT: -Drive a car -Advertising copywriter -Drink alcoholic beverages -Take any medication unless instructed by your physician -Make any legal decisions or sign important papers.  Meals: Start with liquid foods such as gelatin or soup. Progress to regular foods as tolerated. Avoid greasy, spicy, heavy foods. If nausea and/or vomiting occur, drink only clear liquids until the nausea and/or vomiting subsides. Call your physician if vomiting continues.  Special Instructions/Symptoms: Your throat may feel dry or sore from the anesthesia or the breathing tube placed in your throat during surgery. If this causes discomfort, gargle with warm salt water. The discomfort should disappear within 24 hours.  NO Advil. Motrin, Ibuprofen, or Aleve until 7 PM.

## 2022-09-04 NOTE — Anesthesia Postprocedure Evaluation (Signed)
Anesthesia Post Note  Patient: Latoya Bautista  Procedure(s) Performed: EXOSTECTECTOMY TOE (Right: Toe)     Patient location during evaluation: PACU Anesthesia Type: MAC Level of consciousness: awake and alert Pain management: pain level controlled Vital Signs Assessment: post-procedure vital signs reviewed and stable Respiratory status: spontaneous breathing, nonlabored ventilation, respiratory function stable and patient connected to nasal cannula oxygen Cardiovascular status: stable and blood pressure returned to baseline Postop Assessment: no apparent nausea or vomiting Anesthetic complications: no   No notable events documented.  Last Vitals:  Vitals:   09/04/22 0945 09/04/22 1015  BP: 122/60 133/69  Pulse: 63 (!) 58  Resp: 15 17  Temp:  36.6 C  SpO2: 99% 100%    Last Pain:  Vitals:   09/04/22 1015  TempSrc:   PainSc: 0-No pain                 Earl Lites P Aamori Mcmasters

## 2022-09-05 ENCOUNTER — Encounter (HOSPITAL_BASED_OUTPATIENT_CLINIC_OR_DEPARTMENT_OTHER): Payer: Self-pay | Admitting: Podiatry

## 2022-09-05 ENCOUNTER — Telehealth: Payer: Self-pay | Admitting: *Deleted

## 2022-09-05 LAB — SURGICAL PATHOLOGY

## 2022-09-05 NOTE — Telephone Encounter (Signed)
Patient wanted to let the physician know that she stubbed her post surgical foot a little one day ago, is not experiencing any problems,no pain, bleeding,no nausea and has only taken tylenol one time since surgery, following post op instructions given.

## 2022-09-09 ENCOUNTER — Ambulatory Visit (INDEPENDENT_AMBULATORY_CARE_PROVIDER_SITE_OTHER): Payer: Medicaid Other

## 2022-09-09 ENCOUNTER — Ambulatory Visit (INDEPENDENT_AMBULATORY_CARE_PROVIDER_SITE_OTHER): Payer: Medicaid Other | Admitting: Podiatry

## 2022-09-09 DIAGNOSIS — M898X9 Other specified disorders of bone, unspecified site: Secondary | ICD-10-CM

## 2022-09-11 NOTE — Progress Notes (Signed)
Subjective: Chief Complaint  Patient presents with   Routine Post Op    POV #1 DOS 09/04/2022 RT BIG TOE REMOVAL OF BONE SPUR    Latoya Bautista is a 52 y.o. is seen today in office s/p right hallux exostectomy preformed on 09/04/2022.  Denies any significant pain.  No fevers or chills.  She still in surgical shoe.  Objective: General: No acute distress, AAOx3  DP/PT pulses palpable 2/4, CRT < 3 sec to all digits.  Protective sensation intact. Motor function intact.  Right foot: Incision is well coapted without any evidence of dehiscence.  Sutures intact.  There is a blood blister present on the nailbed with admitted to the blister fluid was removed.  There is no purulence.  There is minimal edema there is no erythema or warmth.  The is no crepitation.   No pain with calf compression, swelling, warmth, erythema.   Assessment and Plan:  Status post right exostectomy, doing well with no complications   -Treatment options discussed including all alternatives, risks, and complications -X-rays obtained and reviewed.  3 views of the foot were obtained.  Status post exostectomy of the hallux.  No evidence of acute fracture. -I did clean the blood blister and drained this today.  There is no purulence.  Betadine applied followed by dressing.  She can keep the dressing clean, dry, intact -Ice/elevation -Pain medication as needed. -Monitor for any clinical signs or symptoms of infection and DVT/PE and directed to call the office immediately should any occur or go to the ER. -Follow-up as scheduled for possible suture removal or sooner if any problems arise. In the meantime, encouraged to call the office with any questions, concerns, change in symptoms.   Ovid Curd, DPM

## 2022-09-19 ENCOUNTER — Ambulatory Visit (INDEPENDENT_AMBULATORY_CARE_PROVIDER_SITE_OTHER): Payer: Medicaid Other | Admitting: Podiatry

## 2022-09-19 DIAGNOSIS — M898X9 Other specified disorders of bone, unspecified site: Secondary | ICD-10-CM

## 2022-09-20 ENCOUNTER — Encounter: Payer: Self-pay | Admitting: Podiatry

## 2022-09-25 NOTE — Progress Notes (Signed)
Subjective: Chief Complaint  Patient presents with   Routine Post Op    RM 11 POV #2 DOS 09/04/2022 RT BIG TOE REMOVAL OF BONE SPUR. Possible suture removal. Pt states she is doing well. No NV, fever or chills.      Latoya Bautista is a 52 y.o. is seen today in office s/p right hallux exostectomy preformed on 09/04/2022.  She presents today for possible suture removal.  No fevers or chills that she reports.  She has no other concerns.  Objective: General: No acute distress, AAOx3  DP/PT pulses palpable 2/4, CRT < 3 sec to all digits.  Protective sensation intact. Motor function intact.  Right foot: Incision is well coapted without any evidence of dehiscence.  There is a blood blister on the nailbed is scabbed over.  There is no drainage or pus or any blister formation today.  Slight edema there is no erythema warmth or any signs of infection.    No pain with calf compression, swelling, warmth, erythema.   Assessment and Plan:  Status post right exostectomy, doing well with no complications   -Treatment options discussed including all alternatives, risks, and complications -Remove the sutures today were complications incisions well coapted.  Steri-Strips applied for reinforcement.  Small amount of antibiotic ointment was applied followed by dressing.  She can start to wash with soap and water, dry thoroughly and apply a similar bandage.  Continue surgical shoe for now until scar is formed.  Continue ice and elevate as well as compression to help with the residual edema.  Latoya Bautista DPM

## 2022-10-01 DIAGNOSIS — K219 Gastro-esophageal reflux disease without esophagitis: Secondary | ICD-10-CM | POA: Diagnosis not present

## 2022-10-01 DIAGNOSIS — M810 Age-related osteoporosis without current pathological fracture: Secondary | ICD-10-CM | POA: Diagnosis not present

## 2022-10-01 DIAGNOSIS — K7460 Unspecified cirrhosis of liver: Secondary | ICD-10-CM | POA: Diagnosis not present

## 2022-10-01 DIAGNOSIS — R011 Cardiac murmur, unspecified: Secondary | ICD-10-CM | POA: Diagnosis not present

## 2022-10-01 DIAGNOSIS — Z8249 Family history of ischemic heart disease and other diseases of the circulatory system: Secondary | ICD-10-CM | POA: Diagnosis not present

## 2022-10-01 DIAGNOSIS — E2839 Other primary ovarian failure: Secondary | ICD-10-CM | POA: Diagnosis not present

## 2022-10-01 DIAGNOSIS — I10 Essential (primary) hypertension: Secondary | ICD-10-CM | POA: Diagnosis not present

## 2022-10-01 DIAGNOSIS — Z9481 Bone marrow transplant status: Secondary | ICD-10-CM | POA: Diagnosis not present

## 2022-10-01 DIAGNOSIS — E785 Hyperlipidemia, unspecified: Secondary | ICD-10-CM | POA: Diagnosis not present

## 2022-10-01 DIAGNOSIS — G40909 Epilepsy, unspecified, not intractable, without status epilepticus: Secondary | ICD-10-CM | POA: Diagnosis not present

## 2022-10-01 DIAGNOSIS — E039 Hypothyroidism, unspecified: Secondary | ICD-10-CM | POA: Diagnosis not present

## 2022-10-03 ENCOUNTER — Ambulatory Visit (INDEPENDENT_AMBULATORY_CARE_PROVIDER_SITE_OTHER): Payer: Medicaid Other | Admitting: Podiatry

## 2022-10-03 ENCOUNTER — Ambulatory Visit (INDEPENDENT_AMBULATORY_CARE_PROVIDER_SITE_OTHER): Payer: Medicaid Other

## 2022-10-03 DIAGNOSIS — M898X9 Other specified disorders of bone, unspecified site: Secondary | ICD-10-CM

## 2022-10-05 DIAGNOSIS — Z419 Encounter for procedure for purposes other than remedying health state, unspecified: Secondary | ICD-10-CM | POA: Diagnosis not present

## 2022-10-07 NOTE — Progress Notes (Unsigned)
Subjective: No chief complaint on file.  Latoya Bautista is a 52 y.o. is seen today in office s/p right hallux exostectomy preformed on 09/04/2022.  She is back to her regular shoe today.  She denies any drainage or pus or increasing swelling or redness.  No fevers or chills.  No significant pain.    Objective: General: No acute distress, AAOx3  DP/PT pulses palpable 2/4, CRT < 3 sec to all digits.  Protective sensation intact. Motor function intact.  Right foot: Incision is well coapted without any evidence of dehiscence.  Some scabbing is present along the nailbed as well as along the incision upon debridement there is incision is healed without any evidence of dehiscence at this time.  There is no severe edema is no erythema or warmth.  There is no fluctuance, crepitation.  No blister. No pain with calf compression, swelling, warmth, erythema.   Assessment and Plan:  Status post right exostectomy, doing well with no complications   -Treatment options discussed including all alternatives, risks, and complications -Debrided of the loose callus, scab to any complications.  Still recommend a small amount of antibiotic ointment dressing changes daily can leave the area open at nighttime as well.  Gradually increase activity level as tolerated. -Monitor for any clinical signs or symptoms of infection and directed to call the office immediately should any occur or go to the ER.  Return in about 6 weeks (around 11/14/2022) for post-op check .   Vivi Barrack DPM

## 2022-10-17 DIAGNOSIS — E039 Hypothyroidism, unspecified: Secondary | ICD-10-CM | POA: Diagnosis not present

## 2022-10-17 DIAGNOSIS — E782 Mixed hyperlipidemia: Secondary | ICD-10-CM | POA: Diagnosis not present

## 2022-10-17 DIAGNOSIS — N1832 Chronic kidney disease, stage 3b: Secondary | ICD-10-CM | POA: Diagnosis not present

## 2022-10-17 DIAGNOSIS — R7301 Impaired fasting glucose: Secondary | ICD-10-CM | POA: Diagnosis not present

## 2022-10-23 DIAGNOSIS — N1832 Chronic kidney disease, stage 3b: Secondary | ICD-10-CM | POA: Diagnosis not present

## 2022-10-23 DIAGNOSIS — E039 Hypothyroidism, unspecified: Secondary | ICD-10-CM | POA: Diagnosis not present

## 2022-10-23 DIAGNOSIS — C52 Malignant neoplasm of vagina: Secondary | ICD-10-CM | POA: Diagnosis not present

## 2022-10-23 DIAGNOSIS — J849 Interstitial pulmonary disease, unspecified: Secondary | ICD-10-CM | POA: Diagnosis not present

## 2022-10-23 DIAGNOSIS — I129 Hypertensive chronic kidney disease with stage 1 through stage 4 chronic kidney disease, or unspecified chronic kidney disease: Secondary | ICD-10-CM | POA: Diagnosis not present

## 2022-10-23 DIAGNOSIS — G40909 Epilepsy, unspecified, not intractable, without status epilepticus: Secondary | ICD-10-CM | POA: Diagnosis not present

## 2022-10-23 DIAGNOSIS — E782 Mixed hyperlipidemia: Secondary | ICD-10-CM | POA: Diagnosis not present

## 2022-10-23 DIAGNOSIS — M80051D Age-related osteoporosis with current pathological fracture, right femur, subsequent encounter for fracture with routine healing: Secondary | ICD-10-CM | POA: Diagnosis not present

## 2022-10-23 DIAGNOSIS — D169 Benign neoplasm of bone and articular cartilage, unspecified: Secondary | ICD-10-CM | POA: Diagnosis not present

## 2022-10-23 DIAGNOSIS — C021 Malignant neoplasm of border of tongue: Secondary | ICD-10-CM | POA: Diagnosis not present

## 2022-10-23 DIAGNOSIS — R000 Tachycardia, unspecified: Secondary | ICD-10-CM | POA: Diagnosis not present

## 2022-10-28 ENCOUNTER — Other Ambulatory Visit: Payer: Self-pay | Admitting: Hematology

## 2022-10-28 DIAGNOSIS — K7460 Unspecified cirrhosis of liver: Secondary | ICD-10-CM

## 2022-10-28 DIAGNOSIS — D696 Thrombocytopenia, unspecified: Secondary | ICD-10-CM

## 2022-11-04 ENCOUNTER — Encounter (INDEPENDENT_AMBULATORY_CARE_PROVIDER_SITE_OTHER): Payer: Self-pay | Admitting: *Deleted

## 2022-11-04 DIAGNOSIS — Z419 Encounter for procedure for purposes other than remedying health state, unspecified: Secondary | ICD-10-CM | POA: Diagnosis not present

## 2022-11-12 ENCOUNTER — Telehealth (INDEPENDENT_AMBULATORY_CARE_PROVIDER_SITE_OTHER): Payer: Self-pay | Admitting: Gastroenterology

## 2022-11-12 ENCOUNTER — Inpatient Hospital Stay: Payer: Medicaid Other | Attending: Oncology

## 2022-11-12 DIAGNOSIS — E871 Hypo-osmolality and hyponatremia: Secondary | ICD-10-CM | POA: Insufficient documentation

## 2022-11-12 DIAGNOSIS — Z85810 Personal history of malignant neoplasm of tongue: Secondary | ICD-10-CM | POA: Diagnosis not present

## 2022-11-12 DIAGNOSIS — C021 Malignant neoplasm of border of tongue: Secondary | ICD-10-CM

## 2022-11-12 DIAGNOSIS — Z85828 Personal history of other malignant neoplasm of skin: Secondary | ICD-10-CM | POA: Insufficient documentation

## 2022-11-12 DIAGNOSIS — K7460 Unspecified cirrhosis of liver: Secondary | ICD-10-CM | POA: Diagnosis not present

## 2022-11-12 DIAGNOSIS — Z9484 Stem cells transplant status: Secondary | ICD-10-CM | POA: Diagnosis not present

## 2022-11-12 DIAGNOSIS — R7989 Other specified abnormal findings of blood chemistry: Secondary | ICD-10-CM | POA: Diagnosis not present

## 2022-11-12 LAB — COMPREHENSIVE METABOLIC PANEL
ALT: 24 U/L (ref 0–44)
AST: 30 U/L (ref 15–41)
Albumin: 4 g/dL (ref 3.5–5.0)
Alkaline Phosphatase: 69 U/L (ref 38–126)
Anion gap: 9 (ref 5–15)
BUN: 16 mg/dL (ref 6–20)
CO2: 23 mmol/L (ref 22–32)
Calcium: 9.5 mg/dL (ref 8.9–10.3)
Chloride: 100 mmol/L (ref 98–111)
Creatinine, Ser: 1.09 mg/dL — ABNORMAL HIGH (ref 0.44–1.00)
GFR, Estimated: >60 mL/min (ref >60)
Glucose, Bld: 114 mg/dL — ABNORMAL HIGH (ref 70–99)
Potassium: 4 mmol/L (ref 3.5–5.1)
Sodium: 132 mmol/L — ABNORMAL LOW (ref 135–145)
Total Bilirubin: 0.6 mg/dL (ref 0.3–1.2)
Total Protein: 6.6 g/dL (ref 6.5–8.1)

## 2022-11-12 LAB — CBC WITH DIFFERENTIAL/PLATELET
Abs Immature Granulocytes: 0.03 10*3/uL (ref 0.00–0.07)
Basophils Absolute: 0.1 10*3/uL (ref 0.0–0.1)
Basophils Relative: 1 %
Eosinophils Absolute: 0.1 10*3/uL (ref 0.0–0.5)
Eosinophils Relative: 1 %
HCT: 36.7 % (ref 36.0–46.0)
Hemoglobin: 12.7 g/dL (ref 12.0–15.0)
Immature Granulocytes: 0 %
Lymphocytes Relative: 39 %
Lymphs Abs: 3.3 10*3/uL (ref 0.7–4.0)
MCH: 34.1 pg — ABNORMAL HIGH (ref 26.0–34.0)
MCHC: 34.6 g/dL (ref 30.0–36.0)
MCV: 98.7 fL (ref 80.0–100.0)
Monocytes Absolute: 0.9 10*3/uL (ref 0.1–1.0)
Monocytes Relative: 11 %
Neutro Abs: 4.1 10*3/uL (ref 1.7–7.7)
Neutrophils Relative %: 48 %
Platelets: 165 10*3/uL (ref 150–400)
RBC: 3.72 MIL/uL — ABNORMAL LOW (ref 3.87–5.11)
RDW: 13.2 % (ref 11.5–15.5)
WBC: 8.6 10*3/uL (ref 4.0–10.5)
nRBC: 0 % (ref 0.0–0.2)

## 2022-11-12 LAB — TSH: TSH: 2.273 u[IU]/mL (ref 0.350–4.500)

## 2022-11-12 MED ORDER — PEG 3350-KCL-NA BICARB-NACL 420 G PO SOLR: 4000.0000 mL | Freq: Once | ORAL | 0 refills | Status: AC

## 2022-11-12 NOTE — Addendum Note (Signed)
Addended by: Marlowe Shores on: 11/12/2022 03:12 PM   Modules accepted: Orders

## 2022-11-12 NOTE — Telephone Encounter (Signed)
Who is your primary care physician: Dr.Zack Margo Aye  Reasons for the colonoscopy: Screening  Have you had a colonoscopy before?  no  Do you have family history of colon cancer? no  Previous colonoscopy with polyps removed? no  Do you have a history colorectal cancer?   no  Are you diabetic? If yes, Type 1 or Type 2?    no  Do you have a prosthetic or mechanical heart valve? no  Do you have a pacemaker/defibrillator?   no  Have you had endocarditis/atrial fibrillation? no  Have you had joint replacement within the last 12 months?  No but did break right femur bone rod put in leg  Do you tend to be constipated or have to use laxatives? No occasionally constipated but not really often   Do you have any history of drugs or alchohol?  no  Do you use supplemental oxygen?  no  Have you had a stroke or heart attack within the last 6 months? no  Do you take weight loss medication?  no  For female patients: have you had a hysterectomy?  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       menopause                                            do you still have your menstrual cycle? no      Do you take any blood-thinning medications such as: (aspirin, warfarin, Plavix, Aggrenox)  no  If yes we need the name, milligram, dosage and who is prescribing doctor  Current Outpatient Medications on File Prior to Visit  Medication Sig Dispense Refill   Ascorbic Acid (VITAMIN C) 1000 MG tablet Take 1,000 mg by mouth daily.     cholecalciferol (VITAMIN D) 1000 units tablet Take 1,000 Units by mouth daily.     Coenzyme Q10 (COQ-10) 100 MG CAPS Take 1 capsule by mouth daily.     estradiol (CLIMARA - DOSED IN MG/24 HR) 0.05 mg/24hr patch Place 0.05 mg onto the skin once a week. Wednesday's     famotidine (PEPCID) 20 MG tablet Take 1 tablet (20 mg total) by mouth 2 (two) times daily. (Patient taking differently: Take 20 mg by  mouth daily.) 60 tablet 2   fenofibrate (TRICOR) 145 MG tablet Take 145 mg by mouth daily.     folic acid (FOLVITE) 1 MG tablet TAKE 1 TABLET ONCE DAILY. 30 tablet 5   Garlic 1000 MG CAPS Take 1 capsule by mouth daily.     glucosamine-chondroitin 500-400 MG tablet Take 1 tablet by mouth 2 (two) times daily.     lamoTRIgine (LAMICTAL XR) 50 MG 24 hour tablet Take 3 tablets (150 mg total) by mouth 2 (two) times daily. (Patient taking differently: Take 150 mg by mouth 2 (two) times daily.) 540 tablet 3   levothyroxine (SYNTHROID) 125 MCG tablet Take 0.5 tablets (62.5 mcg total) by mouth daily. (Patient taking differently: Take 62.5 mcg by mouth daily.)     MAGNESIUM PO Take 1 tablet by mouth daily.     Menaquinone-7 (VITAMIN K2) 100 MCG CAPS Take 1  capsule by mouth daily.     Metoprolol Tartrate 37.5 MG TABS Take 2 tablets by mouth 2 (two) times daily. Per pt her pharmacy dispensed 37.5 tab since 75 mg not available     Multiple Vitamins-Minerals (CENTRUM PO) Take 1 tablet by mouth daily.       Omega-3 Fatty Acids (OMEGA-3 FISH OIL PO) Take 1 capsule by mouth daily.     pantoprazole (PROTONIX) 40 MG tablet Take 1 tablet (40 mg total) by mouth daily. (Patient taking differently: Take 40 mg by mouth daily before breakfast.) 90 tablet 1   Probiotic Product (PROBIOTIC DAILY) CAPS Take 1 capsule by mouth daily.     progesterone (PROMETRIUM) 100 MG capsule Take 1 capsule (100 mg total) by mouth daily for 15 days per month. 45 capsule 5   Quercetin 500 MG CAPS Take 1 capsule by mouth daily.     rosuvastatin (CRESTOR) 40 MG tablet Take 1 tablet (40 mg total) by mouth at bedtime. (Patient taking differently: Take 40 mg by mouth at bedtime.) 90 tablet 1   sodium bicarbonate 325 MG tablet Take 325 mg by mouth 2 (two) times daily.     Teriparatide, Recombinant, (FORTEO) 600 MCG/2.4ML SOPN Inject 20 mcg into the skin every evening.     Vitamin D, Ergocalciferol, (DRISDOL) 1.25 MG (50000 UNIT) CAPS capsule Take 1  capsule (50,000 Units total) by mouth every OTHER week as directed (Patient taking differently: Take 50,000 Units by mouth See admin instructions. Take 1 capsule (50,000 units) by mouth every other week.) 20 capsule 1   VITAMIN E PO Take 1 capsule by mouth daily.     No current facility-administered medications on file prior to visit.    Allergies  Allergen Reactions   Simvastatin Rash   Briviact [Brivaracetam]     Elevated LFT's   Asparaginase Derivatives Rash   Elspar [Asparaginase] Rash   Keppra [Levetiracetam] Rash   Soap Rash and Other (See Comments)    Procedure prep-soap (not Betadine, either- per the patient)   Tape Rash and Other (See Comments)    Redness from the tape     Pharmacy: [facility]  Primary Insurance Name: [facility]  Best number where you can be reached: [phone]

## 2022-11-12 NOTE — Telephone Encounter (Signed)
Pt contacted and colonoscopy scheduled for 11/25/22 at 7:30am. Prep sent to pharmacy. Instructions sent to pt via my chart. Will send PA to [facility] for authorization

## 2022-11-14 ENCOUNTER — Ambulatory Visit (INDEPENDENT_AMBULATORY_CARE_PROVIDER_SITE_OTHER): Payer: Medicaid Other | Admitting: Podiatry

## 2022-11-14 ENCOUNTER — Ambulatory Visit (INDEPENDENT_AMBULATORY_CARE_PROVIDER_SITE_OTHER): Payer: Medicaid Other

## 2022-11-14 ENCOUNTER — Encounter: Payer: Self-pay | Admitting: Podiatry

## 2022-11-14 DIAGNOSIS — M898X9 Other specified disorders of bone, unspecified site: Secondary | ICD-10-CM

## 2022-11-14 DIAGNOSIS — M778 Other enthesopathies, not elsewhere classified: Secondary | ICD-10-CM

## 2022-11-14 DIAGNOSIS — M84375A Stress fracture, left foot, initial encounter for fracture: Secondary | ICD-10-CM | POA: Diagnosis not present

## 2022-11-14 DIAGNOSIS — Z9889 Other specified postprocedural states: Secondary | ICD-10-CM

## 2022-11-14 NOTE — Progress Notes (Signed)
Subjective: Chief Complaint  Patient presents with   Routine Post Op    POV #4 DOS 09/04/2022 RT BIG TOE REMOVAL OF BONE SPUR    "The scab finally fell off, feels okay"   Foot Pain    Dorsal forefoot left   "I have been having sharp pains since yesterday in this foot and I have had stress fractures before in both"   52 year old female presents the office for above concerns.  She states that the right foot is been doing well but enough earlier this week.  Some occasional discomfort but she is back to wearing regular shoes and normal activities significant issues.  She started having pain to her left foot yesterday when she woke up.  She does have a history of stress fracture.  No injury.  No swelling.  Status post a report on the Denies any systemic complaints such as fevers, chills, nausea, vomiting. No acute changes since last appointment, and no other complaints at this time.   Objective: AAO x3, NAD DP/PT pulses palpable bilaterally, CRT less than 3 seconds Left foot: Tenderness to metatarsals 2-4 dorsally. No edema.  Flexor, extensor tendons appear to be intact. Right foot: Scar is well-healed.  No lesion.  No edema under the toe.  No tenderness on exam. No pain with calf compression, swelling, warmth, erythema  Assessment: Status post right great toe bone spur excision, healing well; concern for stress fracture left foot  Plan: -All treatment options discussed with the patient including all alternatives, risks, complications.  -X-rays were obtained and reviewed bilaterally.  On the right foot status post exostectomy of the dorsal distal phalanx.  No subacute fracture.  On the left foot there is some radiolucency noted along the fourth metatarsal concerning for possible stress fracture. -Right side doing well.  Continue with supportive shoe gear, offloading.  Monitor for recurrence Discharge from the postoperative course at this time patient agrees. -Open the left foot recommend  immobilization in surgical shoe which she already has at home.  Ice, elevation.  Limited activity, low impact activity.   Return in about 4 weeks (around 12/12/2022), or if symptoms worsen or fail to improve, for left foot stress fracture, x-ray.  Vivi Barrack DPM

## 2022-11-18 ENCOUNTER — Telehealth (INDEPENDENT_AMBULATORY_CARE_PROVIDER_SITE_OTHER): Payer: Self-pay | Admitting: Gastroenterology

## 2022-11-18 NOTE — Telephone Encounter (Signed)
Thanks

## 2022-11-18 NOTE — Telephone Encounter (Signed)
Thanks, I think this makes sense as she is already established with [facility].  Latoya Bautista, are we credentialed at all with [facility] and [facility] or is there is an issue with Dr. Tasia Catchings?  If this is an issue only with Dr. Tasia Catchings, maybe you can check his credentialing status

## 2022-11-18 NOTE — Telephone Encounter (Signed)
Latoya Bautista with [facility] left voicemail stating that Dr.Ahmed is not credentialed with [facility] or [place] [employer]. Wanted to know if we could place her with another provider or they would need to send her to a different provider that is in network. Left message with [name].    Contacted patient. Pt is established with [facility]. Pt will call GI in [place] to see if they can complete procedure and let us know.  Will cancel patient for 11/25/22 at this time.

## 2022-11-19 ENCOUNTER — Inpatient Hospital Stay: Payer: Medicaid Other | Admitting: Hematology

## 2022-11-19 ENCOUNTER — Inpatient Hospital Stay (HOSPITAL_BASED_OUTPATIENT_CLINIC_OR_DEPARTMENT_OTHER): Payer: Medicaid Other | Admitting: Oncology

## 2022-11-19 VITALS — BP 127/55 | HR 73 | Temp 99.0°F | Resp 16 | Wt 136.2 lb

## 2022-11-19 DIAGNOSIS — Z85810 Personal history of malignant neoplasm of tongue: Secondary | ICD-10-CM | POA: Diagnosis not present

## 2022-11-19 DIAGNOSIS — C021 Malignant neoplasm of border of tongue: Secondary | ICD-10-CM

## 2022-11-19 DIAGNOSIS — Z85828 Personal history of other malignant neoplasm of skin: Secondary | ICD-10-CM | POA: Diagnosis not present

## 2022-11-19 DIAGNOSIS — R7989 Other specified abnormal findings of blood chemistry: Secondary | ICD-10-CM | POA: Diagnosis not present

## 2022-11-19 DIAGNOSIS — E871 Hypo-osmolality and hyponatremia: Secondary | ICD-10-CM | POA: Diagnosis not present

## 2022-11-19 DIAGNOSIS — Z9484 Stem cells transplant status: Secondary | ICD-10-CM | POA: Diagnosis not present

## 2022-11-19 DIAGNOSIS — K7460 Unspecified cirrhosis of liver: Secondary | ICD-10-CM | POA: Diagnosis not present

## 2022-11-19 NOTE — Progress Notes (Signed)
[facility] [address] [place], [place] [address]   CLINIC:  Medical Oncology/Hematology  PCP:  Benita Stabile, MD [address] [place] [address] [phone]   REASON FOR VISIT:  Follow-up for invasive squamous cell carcinoma of tongue  PRIOR THERAPY: none  NGS Results: not done  CURRENT THERAPY: surveillance  BRIEF ONCOLOGIC HISTORY:  Oncology History   No history exists.    CANCER STAGING:  Cancer Staging  Squamous cell carcinoma of lateral tongue (HCC) Staging form: Oral Cavity, AJCC 8th Edition - Clinical stage from 12/12/2020: Stage I (cT1, cN0, cM0) - Unsigned   INTERVAL HISTORY:  Latoya Bautista, a 52 y.o. female, returns for routine follow-up of her invasive squamous cell carcinoma of tongue. Latoya Bautista was last seen on 11/14/21.  Today she reports feeling well. She denies tongue pain.  Reports she has follow-up with ENT next month.  Current fall in December 2023 and was admitted with right hip fracture.  Underwent intramedullary nailing on 05/06/2022 and was discharged to a skilled nursing facility.  Reports that she did well and was eventually discharged home at the end of January with outpatient PT.  She is scheduled for a colonoscopy on 11/23/2022.  REVIEW OF SYSTEMS:  Review of Systems  Respiratory:  Positive for shortness of breath.     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51   dx age 38;   chemo;  whole body radiation;  x2 stem  cell transplants @ [facility] 1982 & 1984   History of antineoplastic chemotherapy    child   History of basal cell carcinoma (BCC) excision 10/2016   left leg   History of hepatitis C 2012   pt contracted hep c from blood transfuions's as child in setting of ALL;   08/ 2012 liver bx advanced liver disease mild portal hyertensive gastropathy G2S3, completed 44 wks telaprevir trple bases therapy 12/ 2012 ;  cured and  had  egd 2016 no evidence [facility]   History of kidney stones 2011   History of melanoma in situ 07/2018   vulva bx   History of radiation therapy    whole body radiation in 1980s for ALL   Hyperlipidemia, mixed    Hypersensitivity pneumonitis (HCC)    Hypertension    Hypothyroidism    endocrinologist-- dr Tamala Julian   ILD (interstitial lung disease) [facility])    pulmonologist-- dr Matt Holmes   Intermittent palpitations    cardiologist--- dr j. branch;   monitor 03/ 2019  PACs/ PVCs   Osteoporosis    Primary amenorrhea    Seasonal allergies    Seizure disorder [facility]) 1982   neurologist--- dr Wendee Copp;  since age 25 with first stem cell transplant in setting ALL   Squamous cell carcinoma of lateral tongue [facility]) 2021   oncologist--- dr Ellin Saba  ENT-- Dr Azucena Fallen;  recurrent dysplasia bx's since 2005 until 07/ 2021 small resection and larger excisional bx 07/ 2022 with positive margins;   01-18-2021 right partial glossectomy (less than half)   Thrombocytopenia [facility])    Past Surgical History:  Procedure Laterality Date   BONE MARROW BIOPSY  05/07/2011   BONE MARROW TRANSPLANT     BUNIONECTOMY     CHOLECYSTECTOMY, LAPAROSCOPIC  1994   ESOPHAGOGASTRODUODENOSCOPY  09/03/2016   EXOSTECTECTOMY TOE Right 09/04/2022   Procedure: EXOSTECTECTOMY TOE;  Surgeon: Vivi Barrack, DPM;  [facility]: [facility];  Service: Podiatry;  Laterality: Right;   FEMUR IM NAIL Right 05/06/2022   Procedure: INTRAMEDULLARY (IM) NAIL FEMORAL;  Surgeon: Roby Lofts, MD;  Location:  [facility] OR;  Service: Orthopedics;  Laterality: Right;   MOHS SURGERY  2023   spring   PARTIAL GLOSSECTOMY  01/18/2021   @ [facility];  less than half , right lateral    SOCIAL HISTORY:  Social History   Socioeconomic History   Marital status: Married    Spouse name: [name]   Number of children: 0   Years of education: 12th   Highest education level: Not on file  Occupational History    Employer: [employer]  Tobacco Use   Smoking status: Never    Passive exposure: Never   Smokeless tobacco: Never  Vaping Use   Vaping status: Never Used  Substance and Sexual Activity   Alcohol use: No   Drug use: Never   Sexual activity: Not Currently    Birth control/protection: None, Post-menopausal  Other Topics Concern   Not on file  Social History Narrative   Patient lives at home with her spouse.   Caffeine Use: 16oz bottle of soda daily   Social Determinants of Health   Financial Resource Strain: High Risk (03/15/2022)   Overall Financial Resource Strain (CARDIA)    Difficulty of Paying Living Expenses: Hard  Food Insecurity: No Food Insecurity (05/06/2022)   Hunger Vital Sign    Worried About Running Out of Food in the Last Year: Never true    Ran Out of Food in the Last Year: Never true  Recent Concern: Food Insecurity - Food Insecurity Present (03/15/2022)   Hunger Vital Sign    Worried About Running Out of Food in the Last Year: Sometimes true    Ran Out of Food in the Last Year: Sometimes true  Transportation Needs: No Transportation Needs (05/06/2022)   PRAPARE - Administrator, Civil Service (Medical): No    Lack of Transportation (Non-Medical): No  Physical Activity: Sufficiently Active (03/15/2022)   Exercise Vital Sign    Days of Exercise per Week: 6 days    Minutes of Exercise per Session: 100 min  Stress: No Stress Concern Present (03/15/2022)   [employer] of Occupational Health - Occupational Stress Questionnaire    Feeling of Stress  : Not at all  Social Connections: Socially Integrated (03/15/2022)   Social Connection and Isolation Panel [NHANES]    Frequency of Communication with Friends and Family: Three times a week    Frequency of Social Gatherings with Friends and Family: Three times a week    Attends Religious Services: More than 4 times per year    Active Member of Clubs or Organizations: Yes    Attends Banker Meetings: More than 4 times per year    Marital Status: Married  Catering manager Violence: Not At Risk (05/06/2022)   Humiliation, Afraid, Rape, and Kick questionnaire  Fear of Current or Ex-Partner: No    Emotionally Abused: No    Physically Abused: No    Sexually Abused: No    FAMILY HISTORY:  Family History  Problem Relation Age of Onset   Hypertension Father    Diabetes Maternal Uncle     CURRENT MEDICATIONS:  Current Outpatient Medications  Medication Sig Dispense Refill   Ascorbic Acid (VITAMIN C) 1000 MG tablet Take 1,000 mg by mouth daily.     cholecalciferol (VITAMIN D) 1000 units tablet Take 1,000 Units by mouth daily.     Coenzyme Q10 (COQ-10) 100 MG CAPS Take 1 capsule by mouth daily.     estradiol (CLIMARA - DOSED IN MG/24 HR) 0.05 mg/24hr patch Place 0.05 mg onto the skin once a week. Wednesday's     famotidine (PEPCID) 20 MG tablet Take 1 tablet (20 mg total) by mouth 2 (two) times daily. (Patient taking differently: Take 20 mg by mouth daily.) 60 tablet 2   fenofibrate (TRICOR) 145 MG tablet Take 145 mg by mouth daily.     folic acid (FOLVITE) 1 MG tablet TAKE 1 TABLET ONCE DAILY. 30 tablet 5   Garlic 1000 MG CAPS Take 1 capsule by mouth daily.     glucosamine-chondroitin 500-400 MG tablet Take 1 tablet by mouth 2 (two) times daily.     lamoTRIgine (LAMICTAL XR) 50 MG 24 hour tablet Take 3 tablets (150 mg total) by mouth 2 (two) times daily. (Patient taking differently: Take 150 mg by mouth 2 (two) times daily.) 540 tablet 3   levothyroxine (SYNTHROID) 125 MCG  tablet Take 0.5 tablets (62.5 mcg total) by mouth daily. (Patient taking differently: Take 62.5 mcg by mouth daily.)     MAGNESIUM PO Take 1 tablet by mouth daily.     Menaquinone-7 (VITAMIN K2) 100 MCG CAPS Take 1 capsule by mouth daily.     Metoprolol Tartrate 37.5 MG TABS Take 2 tablets by mouth 2 (two) times daily. Per pt her pharmacy dispensed 37.5 tab since 75 mg not available     Multiple Vitamins-Minerals (CENTRUM PO) Take 1 tablet by mouth daily.       Omega-3 Fatty Acids (OMEGA-3 FISH OIL PO) Take 1 capsule by mouth daily.     pantoprazole (PROTONIX) 40 MG tablet Take 1 tablet (40 mg total) by mouth daily. (Patient taking differently: Take 40 mg by mouth daily before breakfast.) 90 tablet 1   Probiotic Product (PROBIOTIC DAILY) CAPS Take 1 capsule by mouth daily.     progesterone (PROMETRIUM) 100 MG capsule Take 1 capsule (100 mg total) by mouth daily for 15 days per month. 45 capsule 5   Quercetin 500 MG CAPS Take 1 capsule by mouth daily.     rosuvastatin (CRESTOR) 40 MG tablet Take 1 tablet (40 mg total) by mouth at bedtime. (Patient taking differently: Take 40 mg by mouth at bedtime.) 90 tablet 1   sodium bicarbonate 325 MG tablet Take 325 mg by mouth 2 (two) times daily.     Teriparatide, Recombinant, (FORTEO) 600 MCG/2.4ML SOPN Inject 20 mcg into the skin every evening.     Vitamin D, Ergocalciferol, (DRISDOL) 1.25 MG (50000 UNIT) CAPS capsule Take 1 capsule (50,000 Units total) by mouth every OTHER week as directed (Patient taking differently: Take 50,000 Units by mouth See admin instructions. Take 1 capsule (50,000 units) by mouth every other week.) 20 capsule 1   VITAMIN E PO Take 1 capsule by mouth daily.     No current  facility-administered medications for this visit.    ALLERGIES:  Allergies  Allergen Reactions   Simvastatin Rash   Briviact [Brivaracetam]     Elevated LFT's   Asparaginase Derivatives Rash   Elspar [Asparaginase] Rash   Keppra [Levetiracetam] Rash    Soap Rash and Other (See Comments)    Procedure prep-soap (not Betadine, either- per the patient)   Tape Rash and Other (See Comments)    Redness from the tape    PHYSICAL EXAM:  Performance status (ECOG): 0 - Asymptomatic  Vitals:   11/19/22 1353  BP: (!) 127/55  Pulse: 73  Resp: 16  Temp: 99 F (37.2 C)  SpO2: 95%    Wt Readings from Last 3 Encounters:  11/19/22 136 lb 3.9 oz (61.8 kg)  09/04/22 136 lb 12.8 oz (62.1 kg)  07/10/22 139 lb (63 kg)   Physical Exam Constitutional:      Appearance: Normal appearance.  HENT:     Head: Normocephalic and atraumatic.     Mouth/Throat:     Comments: Black hairy tongue  Eyes:     Pupils: Pupils are equal, round, and reactive to light.  Cardiovascular:     Rate and Rhythm: Normal rate and regular rhythm.     Heart sounds: Normal heart sounds. No murmur heard. Pulmonary:     Effort: Pulmonary effort is normal.     Breath sounds: Normal breath sounds. No wheezing.  Abdominal:     General: Bowel sounds are normal. There is no distension.     Palpations: Abdomen is soft.     Tenderness: There is no abdominal tenderness.  Musculoskeletal:        General: Normal range of motion.     Cervical back: Normal range of motion.  Lymphadenopathy:     Cervical: No cervical adenopathy.  Skin:    General: Skin is warm and dry.     Findings: No rash.  Neurological:     Mental Status: She is alert and oriented to person, place, and time.  Psychiatric:        Judgment: Judgment normal.      LABORATORY DATA:  I have reviewed the labs as listed.     Latest Ref Rng & Units 11/12/2022   12:58 PM 09/04/2022    7:27 AM 05/10/2022    5:09 AM  CBC  WBC 4.0 - 10.5 K/uL 8.6     Hemoglobin 12.0 - 15.0 g/dL 16.1  09.6  04.5   Hematocrit 36.0 - 46.0 % 36.7  40.0  31.4   Platelets 150 - 400 K/uL 165         Latest Ref Rng & Units 11/12/2022   12:58 PM 09/04/2022    7:27 AM 05/10/2022    5:09 AM  CMP  Glucose 70 - 99 mg/dL 409  811  914   BUN 6  - 20 mg/dL 16  17  25    Creatinine 0.44 - 1.00 mg/dL 7.82  9.56  2.13   Sodium 135 - 145 mmol/L 132  135  133   Potassium 3.5 - 5.1 mmol/L 4.0  4.4  4.6   Chloride 98 - 111 mmol/L 100  99  100   CO2 22 - 32 mmol/L 23   22   Calcium 8.9 - 10.3 mg/dL 9.5   9.5   Total Protein 6.5 - 8.1 g/dL 6.6     Total Bilirubin 0.3 - 1.2 mg/dL 0.6     Alkaline Phos 38 - 126 U/L 69  AST 15 - 41 U/L 30     ALT 0 - 44 U/L 24       DIAGNOSTIC IMAGING:  I have independently reviewed the scans and discussed with the patient. DG Foot Complete Left  Result Date: 11/14/2022 Please see detailed radiograph report in office note.  DG Foot Complete Right  Result Date: 11/14/2022 Please see detailed radiograph report in office note.    ASSESSMENT:  1.  Recurrent superficial invasive squamous cell carcinoma of the right lateral tongue: - Patient reports that she has been having tongue biopsy for multiple areas of dysplasia on her right tongue in 2005, 2007, 2012.  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 - Biopsy on 12/02/2019 one of the right tongue showed superficial invasive squamous cell carcinoma, p16 negative, margins negative. - She was recently seen by Dr. Ellery Plunk and underwent right lateral tongue biopsy on 11/16/2020.  The new lesion was more anterior on the right side of the tongue to the previous lesion resected in July 2021.  It was about 5 mm in diameter, raised, red/white and firm. - Pathology was consistent with superficial invasive squamous cell carcinoma, with positive lateral margins and negative deep margin.  P16 is pending. - Status post right partial glossectomy on 01/18/2021 at [facility] by Dr. Juliane Poot.  2.  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  She lives at home with her husband.  No children. - She was diagnosed with ALL around the age of 2 years. - She underwent whole-body radiation with stem cell  transplantation at ages 48 and 76 at [facility]. - She also had left leg skin lesion resected in June 2018, superficial nodular basal cell cancer. - She had melanoma in situ on vulvar biopsy on 07/16/2018. - She was never smoker. - No family history of malignancies. - Patient is followed at [facility] hepatology (treated for hep C acquired during transplant) and seizure clinics.   PLAN:  1.  Recurrent superficial invasive squamous cell carcinoma of the right lateral tongue: - She underwent right partial glossectomy in September 2022. - She is evaluated annually by Dr. Azucena Fallen (ENT) at [facility].  She is scheduled to see him next month. - Physical examination today did not reveal any suspicious masses on the tongue. - No palpable adenopathy in the neck region.  Reviewed labs which are grossly within normal limits.  Elevated LFTs from cirrhosis are stable.  TSH was normal. - She has a follow-up with Dr. Azucena Fallen in August this year. - I will see her back in 1 year for follow-up.  2.  Hyponatremia: - She is on carbamazepine which can cause hyponatremia. - Sodium on 11/12/2022 was 132.   Orders placed this encounter:  No orders of the defined types were placed in this encounter.  PLAN SUMMARY: >> RTC in 1  year for labs and see MD/APP.     I spent 20 minutes dedicated to the care of this patient (face-to-face and non-face-to-face) on the date of the encounter to include what is described in the assessment and plan.  Durenda Hurt, NP 11/19/2022 2:19 PM

## 2022-11-25 ENCOUNTER — Encounter (HOSPITAL_COMMUNITY): Admission: RE | Payer: Self-pay | Source: Home / Self Care

## 2022-11-25 ENCOUNTER — Ambulatory Visit (HOSPITAL_COMMUNITY): Admission: RE | Admit: 2022-11-25 | Payer: Medicaid Other | Source: Home / Self Care

## 2022-11-25 SURGERY — COLONOSCOPY WITH PROPOFOL
Anesthesia: Monitor Anesthesia Care

## 2022-11-26 DIAGNOSIS — D120 Benign neoplasm of cecum: Secondary | ICD-10-CM | POA: Diagnosis not present

## 2022-11-26 DIAGNOSIS — D124 Benign neoplasm of descending colon: Secondary | ICD-10-CM | POA: Diagnosis not present

## 2022-11-26 DIAGNOSIS — K635 Polyp of colon: Secondary | ICD-10-CM | POA: Diagnosis not present

## 2022-11-26 DIAGNOSIS — K5730 Diverticulosis of large intestine without perforation or abscess without bleeding: Secondary | ICD-10-CM | POA: Diagnosis not present

## 2022-11-26 DIAGNOSIS — D122 Benign neoplasm of ascending colon: Secondary | ICD-10-CM | POA: Diagnosis not present

## 2022-11-26 DIAGNOSIS — C9191 Lymphoid leukemia, unspecified, in remission: Secondary | ICD-10-CM | POA: Diagnosis not present

## 2022-11-26 DIAGNOSIS — G40909 Epilepsy, unspecified, not intractable, without status epilepticus: Secondary | ICD-10-CM | POA: Diagnosis not present

## 2022-11-26 DIAGNOSIS — K621 Rectal polyp: Secondary | ICD-10-CM | POA: Diagnosis not present

## 2022-11-26 DIAGNOSIS — Z1211 Encounter for screening for malignant neoplasm of colon: Secondary | ICD-10-CM | POA: Diagnosis not present

## 2022-11-26 DIAGNOSIS — K7460 Unspecified cirrhosis of liver: Secondary | ICD-10-CM | POA: Diagnosis not present

## 2022-11-26 DIAGNOSIS — E039 Hypothyroidism, unspecified: Secondary | ICD-10-CM | POA: Diagnosis not present

## 2022-11-26 DIAGNOSIS — D126 Benign neoplasm of colon, unspecified: Secondary | ICD-10-CM | POA: Diagnosis not present

## 2022-11-26 DIAGNOSIS — Z79899 Other long term (current) drug therapy: Secondary | ICD-10-CM | POA: Diagnosis not present

## 2022-11-26 DIAGNOSIS — D125 Benign neoplasm of sigmoid colon: Secondary | ICD-10-CM | POA: Diagnosis not present

## 2022-11-26 DIAGNOSIS — J984 Other disorders of lung: Secondary | ICD-10-CM | POA: Diagnosis not present

## 2022-11-26 DIAGNOSIS — I10 Essential (primary) hypertension: Secondary | ICD-10-CM | POA: Diagnosis not present

## 2022-12-12 ENCOUNTER — Ambulatory Visit: Payer: Medicaid Other | Admitting: Podiatry

## 2022-12-17 DIAGNOSIS — Z85820 Personal history of malignant melanoma of skin: Secondary | ICD-10-CM | POA: Diagnosis not present

## 2022-12-17 DIAGNOSIS — L821 Other seborrheic keratosis: Secondary | ICD-10-CM | POA: Diagnosis not present

## 2022-12-17 DIAGNOSIS — Z85828 Personal history of other malignant neoplasm of skin: Secondary | ICD-10-CM | POA: Diagnosis not present

## 2022-12-17 DIAGNOSIS — C44300 Unspecified malignant neoplasm of skin of unspecified part of face: Secondary | ICD-10-CM | POA: Diagnosis not present

## 2022-12-17 DIAGNOSIS — D229 Melanocytic nevi, unspecified: Secondary | ICD-10-CM | POA: Diagnosis not present

## 2022-12-17 DIAGNOSIS — C021 Malignant neoplasm of border of tongue: Secondary | ICD-10-CM | POA: Diagnosis not present

## 2022-12-17 DIAGNOSIS — L814 Other melanin hyperpigmentation: Secondary | ICD-10-CM | POA: Diagnosis not present

## 2023-01-20 ENCOUNTER — Telehealth: Payer: Self-pay

## 2023-01-20 NOTE — Telephone Encounter (Signed)
LVM for patient to call back [phone], or to call PCP office to schedule follow up apt. AS, CMA  

## 2023-02-27 ENCOUNTER — Ambulatory Visit: Payer: Medicaid Other | Admitting: Pulmonary Disease

## 2023-03-07 ENCOUNTER — Ambulatory Visit: Payer: Medicaid Other | Attending: Medical | Admitting: Medical

## 2023-03-07 ENCOUNTER — Encounter: Payer: Self-pay | Admitting: Medical

## 2023-03-07 VITALS — BP 136/58 | HR 74 | Ht 59.0 in | Wt 136.0 lb

## 2023-03-07 DIAGNOSIS — I2510 Atherosclerotic heart disease of native coronary artery without angina pectoris: Secondary | ICD-10-CM | POA: Insufficient documentation

## 2023-03-07 DIAGNOSIS — R002 Palpitations: Secondary | ICD-10-CM | POA: Insufficient documentation

## 2023-03-07 MED ORDER — FENOFIBRATE 145 MG PO TABS: 145.0000 mg | ORAL_TABLET | Freq: Every day | ORAL | 3 refills | Status: DC

## 2023-03-07 MED ORDER — METOPROLOL TARTRATE 75 MG PO TABS: 75.0000 mg | ORAL_TABLET | Freq: Two times a day (BID) | ORAL | 11 refills | Status: AC

## 2023-03-07 MED ORDER — ROSUVASTATIN CALCIUM 40 MG PO TABS: 40.0000 mg | ORAL_TABLET | Freq: Every evening | ORAL | 3 refills | Status: DC

## 2023-03-07 NOTE — Progress Notes (Signed)
Cardiology Office Note:    Date:  03/07/2023   ID:  Latoya Bautista, DOB 04/18/1971, MRN [id number]  PCP:  Benita Stabile, MD  [facility] Cardiologist:  Dina Rich, MD  [facility] Electrophysiologist:  None   Referring MD: Benita Stabile, MD   Chief Complaint: 1 year follow-up  History of Present Illness:    Latoya Bautista is a 52 y.o. female with a hx of palpitations with PACs and PVCs on heart monitor in 2019, ILD and invasive squamous cell CA of tongue.   The patient was last seen 11/2021 reporting chest pain followed by shingles diagnosis. Metoprolol was increased to 50mg  BID and cardiac CTA was ordered. Cardiac CTA showed coronary calcium score of 88.4, 98th percentile for age and sex matched control. Moderate stenosis. FFR was negative.  Today, the patient is overall doing well from a cardia perspective. She broke her right hip at the beginning of the year and underwent surgery. She subsequently went to rehab. Function is back to normal. She goes to the [facility] daily. She denies chest pain. She has chronic SOB from lung disease. She reports rare palpitations. She denies lower leg edema, orthopnea or pnd.   Past Medical History:  Diagnosis Date   Anemia    Cirrhosis of liver without ascites (HCC)    gastro/ liver @ [facility]-- lindsey yaxheimer NP;  secondary to HCV,  well compensated   Coronary artery calcification seen on CAT scan    followed by cardiology;   CCT w/ FFR 01-22-2022  calcium score=88.4, LAD / RCA   DOE (dyspnea on exertion)    GERD (gastroesophageal reflux disease)    H/O stem cell transplant (HCC)    1982 and 1984   Heart murmur    History of acute lymphoblastic leukemia (ALL) in remission 57   dx age 70;   chemo;  whole body radiation;  x2 stem cell transplants @ [facility] 1982 & 1984   History of antineoplastic chemotherapy    child   History of basal cell carcinoma (BCC) excision 10/2016   left leg   History of hepatitis C 2012   pt contracted  hep c from blood transfuions's as child in setting of ALL;   08/ 2012 liver bx advanced liver disease mild portal hyertensive gastropathy G2S3, completed 44 wks telaprevir trple bases therapy 12/ 2012 ;  cured and  had  egd 2016 no evidence [facility]   History of kidney stones 2011   History of melanoma in situ 07/2018   vulva bx   History of radiation therapy    whole body radiation in 1980s for ALL   Hyperlipidemia, mixed    Hypersensitivity pneumonitis (HCC)    Hypertension    Hypothyroidism    endocrinologist-- dr Tamala Julian   ILD (interstitial lung disease) [facility])    pulmonologist-- dr Matt Holmes   Intermittent palpitations    cardiologist--- dr j. branch;   monitor 03/ 2019  PACs/ PVCs   Osteoporosis    Primary amenorrhea    Seasonal allergies    Seizure disorder [facility]) 1982   neurologist--- dr Wendee Copp;  since age 42 with first stem cell transplant in setting ALL   Squamous cell carcinoma of lateral tongue [facility]) 2021   oncologist--- dr Ellin Saba  ENT-- Dr Azucena Fallen;    recurrent dysplasia bx's since 2005 until 07/ 2021 small resection and larger excisional bx 07/ 2022 with positive margins;   01-18-2021 right partial glossectomy (less than half)  Thrombocytopenia [facility])     Past Surgical History:  Procedure Laterality Date   BONE MARROW BIOPSY  05/07/2011   BONE MARROW TRANSPLANT     BUNIONECTOMY     CHOLECYSTECTOMY, LAPAROSCOPIC  1994   ESOPHAGOGASTRODUODENOSCOPY  09/03/2016   EXOSTECTECTOMY TOE Right 09/04/2022   Procedure: EXOSTECTECTOMY TOE;  Surgeon: Vivi Barrack, DPM;  Location: [facility] [place];  Service: Podiatry;  Laterality: Right;   FEMUR IM NAIL Right 05/06/2022   Procedure: INTRAMEDULLARY (IM) NAIL FEMORAL;  Surgeon: Roby Lofts, MD;  Location: [facility] OR;  Service: Orthopedics;  Laterality: Right;   MOHS SURGERY  2023   spring   PARTIAL GLOSSECTOMY  01/18/2021   @ [facility];  less than half , right lateral    Current Medications: Current Meds   Medication Sig   Ascorbic Acid (VITAMIN C) 1000 MG tablet Take 1,000 mg by mouth daily.   cholecalciferol (VITAMIN D) 1000 units tablet Take 1,000 Units by mouth daily.   Coenzyme Q10 (COQ-10) 100 MG CAPS Take 1 capsule by mouth daily.   estradiol (CLIMARA - DOSED IN MG/24 HR) 0.05 mg/24hr patch Place 0.05 mg onto the skin once a week. Wednesday's   famotidine (PEPCID) 20 MG tablet Take 1 tablet (20 mg total) by mouth 2 (two) times daily. (Patient taking differently: Take 20 mg by mouth daily.)   folic acid (FOLVITE) 1 MG tablet TAKE 1 TABLET ONCE DAILY.   Garlic 1000 MG CAPS Take 1 capsule by mouth daily.   glucosamine-chondroitin 500-400 MG tablet Take 1 tablet by mouth 2 (two) times daily.   lamoTRIgine (LAMICTAL XR) 50 MG 24 hour tablet Take 3 tablets (150 mg total) by mouth 2 (two) times daily. (Patient taking differently: Take 150 mg by mouth 2 (two) times daily.)   levothyroxine (SYNTHROID) 125 MCG tablet Take 0.5 tablets (62.5 mcg total) by mouth daily. (Patient taking differently: Take 62.5 mcg by mouth daily.)   MAGNESIUM PO Take 1 tablet by mouth daily.   Menaquinone-7 (VITAMIN K2) 100 MCG CAPS Take 1 capsule by mouth daily.   Multiple Vitamins-Minerals (CENTRUM PO) Take 1 tablet by mouth daily.     Omega-3 Fatty Acids (OMEGA-3 FISH OIL PO) Take 1 capsule by mouth daily.   pantoprazole (PROTONIX) 40 MG tablet Take 1 tablet (40 mg total) by mouth daily. (Patient taking differently: Take 40 mg by mouth daily before breakfast.)   Probiotic Product (PROBIOTIC DAILY) CAPS Take 1 capsule by mouth daily.   progesterone (PROMETRIUM) 100 MG capsule Take 1 capsule (100 mg total) by mouth daily for 15 days per month.   Quercetin 500 MG CAPS Take 1 capsule by mouth daily.   sodium bicarbonate 325 MG tablet Take 325 mg by mouth 2 (two) times daily.   Vitamin D, Ergocalciferol, (DRISDOL) 1.25 MG (50000 UNIT) CAPS capsule Take 1 capsule (50,000 Units total) by mouth every OTHER week as directed  (Patient taking differently: Take 50,000 Units by mouth See admin instructions. Take 1 capsule (50,000 units) by mouth every other week.)   VITAMIN E PO Take 1 capsule by mouth daily.   [DISCONTINUED] fenofibrate (TRICOR) 145 MG tablet Take 145 mg by mouth daily.   [DISCONTINUED] Metoprolol Tartrate 75 MG TABS Take 75 mg by mouth 2 (two) times daily.   [DISCONTINUED] rosuvastatin (CRESTOR) 40 MG tablet Take 1 tablet (40 mg total) by mouth at bedtime. (Patient taking differently: Take 40 mg by mouth at bedtime.)     Allergies:   Simvastatin, Briviact [brivaracetam],  Asparaginase derivatives, Elspar [asparaginase], Keppra [levetiracetam], Soap, and Tape   Social History   Socioeconomic History   Marital status: Married    Spouse name: [name]   Number of children: 0   Years of education: 12th   Highest education level: Not on file  Occupational History    Employer: [employer]  Tobacco Use   Smoking status: Never    Passive exposure: Never   Smokeless tobacco: Never  Vaping Use   Vaping status: Never Used  Substance and Sexual Activity   Alcohol use: No   Drug use: Never   Sexual activity: Not Currently    Birth control/protection: None, Post-menopausal  Other Topics Concern   Not on file  Social History Narrative   Patient lives at home with her spouse.   Caffeine Use: 16oz bottle of soda daily   Social Determinants of Health   Financial Resource Strain: High Risk (03/15/2022)   Overall Financial Resource Strain (CARDIA)    Difficulty of Paying Living Expenses: Hard  Food Insecurity: No Food Insecurity (05/06/2022)   Hunger Vital Sign    Worried About Running Out of Food in the Last Year: Never true    Ran Out of Food in the Last Year: Never true  Recent Concern: Food Insecurity - Food Insecurity Present (03/15/2022)   Hunger Vital Sign    Worried About Running Out of Food in the Last Year: Sometimes true    Ran Out of Food in the Last Year: Sometimes true   Transportation Needs: No Transportation Needs (05/06/2022)   PRAPARE - Administrator, Civil Service (Medical): No    Lack of Transportation (Non-Medical): No  Physical Activity: Sufficiently Active (03/15/2022)   Exercise Vital Sign    Days of Exercise per Week: 6 days    Minutes of Exercise per Session: 100 min  Stress: No Stress Concern Present (03/15/2022)   [employer] of Occupational Health - Occupational Stress Questionnaire    Feeling of Stress : Not at all  Social Connections: Socially Integrated (03/15/2022)   Social Connection and Isolation Panel [NHANES]    Frequency of Communication with Friends and Family: Three times a week    Frequency of Social Gatherings with Friends and Family: Three times a week    Attends Religious Services: More than 4 times per year    Active Member of Clubs or Organizations: Yes    Attends Engineer, structural: More than 4 times per year    Marital Status: Married     Family History: The patient's family history includes Diabetes in her maternal uncle; Hypertension in her father.  ROS:   Please see the history of present illness.     All other systems reviewed and are negative.  EKGs/Labs/Other Studies Reviewed:    The following studies were reviewed today:  Cardiac CTA 01/2022  IMPRESSION: 1. Coronary calcium score of 88.4. This was 41 percentile for age and sex matched control.   2. Normal coronary origin with right dominance.   3. CAD-RADS 3. Moderate stenosis. Consider symptom-guided anti-ischemic pharmacotherapy as well as risk factor modification per guideline directed care. Additional analysis with CT FFR will be submitted.   The noncardiac portion of this study will be interpreted in separate report by the radiologist.  IMPRESSION: 1.  CT FFR analysis showed no significant stenosis.   RECOMMENDATIONS: Guideline-directed medical therapy and aggressive risk factor modification for secondary  prevention of coronary artery disease.   Electronically Signed: By: Lavona Mound  Tobb D.O. On: 01/22/2022 11:47    Echo 12/2021 1. Left ventricular ejection fraction, by estimation, is 60 to 65%. Left  ventricular ejection fraction by 2D MOD biplane is 62.0 %. The left  ventricle has normal function. The left ventricle has no regional wall  motion abnormalities. Indeterminate  diastolic filling due to E-A fusion.   2. Right ventricular systolic function is normal. The right ventricular  size is normal.   3. The mitral valve is grossly normal. Trivial mitral valve  regurgitation. No evidence of mitral stenosis.   4. The aortic valve is tricuspid. Aortic valve regurgitation is trivial.  No aortic stenosis is present.   Comparison(s): No significant change from prior study.      EKG:  EKG is ordered today.  The ekg ordered today demonstrates NSR 71bpm, nonspecific t wave changes  Recent Labs: 05/10/2022: Magnesium 2.0 11/12/2022: ALT 24; BUN 16; Creatinine, Ser 1.09; Hemoglobin 12.7; Platelets 165; Potassium 4.0; Sodium 132; TSH 2.273  Recent Lipid Panel    Component Value Date/Time   CHOL 125 01/23/2022 0804   TRIG 143 01/23/2022 0804   HDL 40 (L) 01/23/2022 0804   CHOLHDL 3.1 01/23/2022 0804   VLDL 29 01/23/2022 0804   LDLCALC 56 01/23/2022 0804    Physical Exam:    VS:  BP (!) 136/58   Pulse 74   Ht 4\' 11"  (1.499 m)   Wt 136 lb (61.7 kg)   SpO2 98%   BMI 27.47 kg/m     Wt Readings from Last 3 Encounters:  03/07/23 136 lb (61.7 kg)  11/19/22 136 lb 3.9 oz (61.8 kg)  09/04/22 136 lb 12.8 oz (62.1 kg)     GEN:  Well nourished, well developed in no acute distress HEENT: Normal NECK: No JVD; No carotid bruits LYMPHATICS: No lymphadenopathy CARDIAC: RRR, no murmurs, rubs, gallops RESPIRATORY:  Clear to auscultation without rales, wheezing or rhonchi  ABDOMEN: Soft, non-tender, non-distended MUSCULOSKELETAL:  No edema; No deformity  SKIN: Warm and dry NEUROLOGIC:   Alert and oriented x 3 PSYCHIATRIC:  Normal affect   ASSESSMENT:    1. Palpitations   2. Atherosclerosis of native coronary artery of native heart without angina pectoris    PLAN:    In order of problems listed above:  Palpitations Patient reports rare palpitations. I will refill BB. Continue metoprolol 75mg BID.   Coronary artery calcifications Cardiac CT in 01/2022 showed coronary calcium score of 88.4, 98th percentile for age and sex matched control, moderate stenosis. FFR showed no significant stenosis. The patient denies chest pain. She has chronic SOB from underlying lung disease. Continue fenoibrate, Crestor and metoprolol. No further ischemic work-up indicated at this time.   Disposition: Follow up in 1 year(s) with MD/APP    Signed, Moya Duan David Stall, PA-C  03/07/2023 2:41 PM    [facility]

## 2023-03-07 NOTE — Patient Instructions (Signed)
Medication Instructions:  Your physician recommends that you continue on your current medications as directed. Please refer to the Current Medication list given to you today.  *If you need a refill on your cardiac medications before your next appointment, please call your pharmacy*   Lab Work: NONE   If you have labs (blood work) drawn today and your tests are completely normal, you will receive your results only by: MyChart Message (if you have MyChart) OR A paper copy in the mail If you have any lab test that is abnormal or we need to change your treatment, we will call you to review the results.   Testing/Procedures: NONE    Follow-Up: At [facility], you and your health needs are our priority.  As part of our continuing mission to provide you with exceptional heart care, we have created designated Provider Care Teams.  These Care Teams include your primary Cardiologist (physician) and Advanced Practice Providers (APPs -  Physician Assistants and Nurse Practitioners) who all work together to provide you with the care you need, when you need it.  We recommend signing up for the patient portal called "MyChart".  Sign up information is provided on this After Visit Summary.  MyChart is used to connect with patients for Virtual Visits (Telemedicine).  Patients are able to view lab/test results, encounter notes, upcoming appointments, etc.  Non-urgent messages can be sent to your provider as well.   To learn more about what you can do with MyChart, go to [email or web address].    Your next appointment:   1 year(s)  Provider:   You may see Branch, Jonathan, MD or one of the following Advanced Practice Providers on your designated Care Team:   Brittany Strader, PA-C  Michele Lenze, PA-C     Other Instructions Thank you for choosing [facility]!    

## 2023-03-18 ENCOUNTER — Ambulatory Visit: Payer: Medicaid Other | Admitting: Obstetrics & Gynecology

## 2023-03-18 ENCOUNTER — Other Ambulatory Visit (HOSPITAL_COMMUNITY)
Admission: RE | Admit: 2023-03-18 | Discharge: 2023-03-18 | Disposition: A | Discharge status: Home / Self Care | Payer: Medicaid Other | Source: Ambulatory Visit | Attending: Obstetrics & Gynecology | Admitting: Obstetrics & Gynecology

## 2023-03-18 VITALS — BP 120/74 | HR 76 | Ht 59.0 in | Wt 137.0 lb

## 2023-03-18 DIAGNOSIS — Z79890 Hormone replacement therapy (postmenopausal): Secondary | ICD-10-CM

## 2023-03-18 DIAGNOSIS — Z01419 Encounter for gynecological examination (general) (routine) without abnormal findings: Secondary | ICD-10-CM | POA: Insufficient documentation

## 2023-03-18 NOTE — Progress Notes (Signed)
Subjective:     Latoya Bautista is a 52 y.o. female here for a routine exam.  No LMP recorded. Patient is postmenopausal. G0P0000 Birth Control Method:  none Menstrual Calendar(currently): minimal withdrawal after prometrium  Current complaints: none.   Current acute medical issues:  no acute   Recent Gynecologic History No LMP recorded. Patient is postmenopausal. 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66   dx age 89;   chemo;  whole body radiation;  x2 stem cell transplants @ [facility] 1982 & 1984   History of antineoplastic chemotherapy    child   History of basal cell carcinoma (BCC) excision 10/2016   left leg   History of hepatitis C 2012   pt contracted hep c from blood transfuions's as child in setting of ALL;   08/ 2012 liver bx advanced liver disease mild portal hyertensive gastropathy G2S3, completed 44 wks telaprevir trple bases therapy 12/ 2012 ;  cured and  had  egd 2016 no evidence [facility]   History of kidney stones 2011   History of melanoma in situ 07/2018   vulva bx   History of radiation therapy    whole body radiation in 1980s for ALL   Hyperlipidemia, mixed    Hypersensitivity pneumonitis (HCC)    Hypertension    Hypothyroidism    endocrinologist-- dr Tamala Julian   ILD (interstitial lung disease) [facility])    pulmonologist-- dr Matt Holmes   Intermittent palpitations    cardiologist--- dr j. 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   oncologist--- dr Ellin Saba  ENT-- Dr Azucena Fallen;    recurrent dysplasia bx's since 2005 until 07/ 2021 small resection and larger excisional bx 07/ 2022 with positive margins;   01-18-2021 right partial glossectomy (less than half)   Thrombocytopenia [facility])     Past Surgical History:  Procedure Laterality Date   BONE MARROW BIOPSY  05/07/2011   BONE MARROW TRANSPLANT     BUNIONECTOMY     CHOLECYSTECTOMY, LAPAROSCOPIC  1994   ESOPHAGOGASTRODUODENOSCOPY  09/03/2016   EXOSTECTECTOMY TOE Right 09/04/2022   Procedure: EXOSTECTECTOMY TOE;  Surgeon: Vivi Barrack, DPM;  Location: [facility];  Service: Podiatry;  Laterality: Right;   FEMUR IM NAIL Right 05/06/2022   Procedure: INTRAMEDULLARY (IM) NAIL FEMORAL;  Surgeon: Roby Lofts, MD;  Location: [facility] OR;  Service: Orthopedics;  Laterality: Right;   MOHS SURGERY  2023   spring   PARTIAL GLOSSECTOMY  01/18/2021   @ [facility];  less than half , right lateral    OB History  Gravida  0   Para  0   Term  0   Preterm  0   AB  0   Living  0      SAB  0   IAB  0   Ectopic  0   Multiple  0   Live Births  0           Social History   Socioeconomic History   Marital status: Married    Spouse name: [name]   Number of children: 0   Years of education: 12th   Highest education level: Not on file  Occupational History    Employer: [employer]  Tobacco Use   Smoking status: Never    Passive exposure: Never   Smokeless tobacco: Never  Vaping Use   Vaping status: Never Used  Substance and Sexual Activity   Alcohol use: No   Drug use: Never   Sexual activity: Not Currently    Birth control/protection: None, Post-menopausal  Other Topics Concern   Not on file   Social History Narrative   Patient lives at home with her spouse.   Caffeine Use: 16oz bottle of soda daily   Social Determinants of Health   Financial Resource Strain: Low Risk  (03/18/2023)   Overall Financial Resource Strain (CARDIA)    Difficulty of Paying Living Expenses: Not very hard  Food Insecurity: No Food Insecurity (03/18/2023)   Hunger Vital Sign    Worried About Running Out of Food in the Last Year: Never true    Ran Out of Food in the Last Year: Never true  Transportation Needs: No Transportation Needs (03/18/2023)   PRAPARE - Administrator, Civil Service (Medical): No    Lack of Transportation (Non-Medical): No  Physical Activity: Sufficiently Active (03/18/2023)   Exercise Vital Sign    Days of Exercise per Week: 6 days    Minutes of Exercise per Session: 100 min  Stress: No Stress Concern Present (03/18/2023)   [employer] of Occupational Health - Occupational Stress Questionnaire    Feeling of Stress : Not at all  Social Connections: Socially Integrated (03/18/2023)   Social Connection and Isolation Panel [NHANES]    Frequency of Communication with Friends and Family: Twice a week    Frequency of Social Gatherings with Friends and Family: More than three times a week    Attends Religious Services: More than 4 times per year    Active Member of [employer] or Organizations: Yes    Attends Engineer, structural: More than 4 times per year    Marital Status: Married    Family History  Problem Relation Age of Onset   Hypertension Father    Diabetes Maternal Uncle      Current Outpatient Medications:    Ascorbic Acid (VITAMIN C) 1000 MG tablet, Take 1,000 mg by mouth daily., Disp: , Rfl:    cholecalciferol (VITAMIN D) 1000 units tablet, Take 1,000 Units by mouth daily., Disp: , Rfl:    Coenzyme Q10 (COQ-10) 100 MG CAPS, Take 1 capsule by mouth daily., Disp: , Rfl:    estradiol (CLIMARA - DOSED IN MG/24 HR) 0.05 mg/24hr patch, Place 0.05  mg onto the skin once a week. Wednesday's, Disp: , Rfl:    famotidine (PEPCID) 20 MG tablet, Take 1 tablet (20 mg total) by mouth 2 (two) times daily. (Patient taking differently: Take 20 mg by mouth daily.), Disp: 60 tablet, Rfl: 2   fenofibrate (TRICOR) 145 MG tablet,  Take 1 tablet (145 mg total) by mouth daily., Disp: 90 tablet, Rfl: 3   folic acid (FOLVITE) 1 MG tablet, TAKE 1 TABLET ONCE DAILY., Disp: 30 tablet, Rfl: 5   Garlic 1000 MG CAPS, Take 1 capsule by mouth daily., Disp: , Rfl:    glucosamine-chondroitin 500-400 MG tablet, Take 1 tablet by mouth 2 (two) times daily., Disp: , Rfl:    lamoTRIgine (LAMICTAL XR) 50 MG 24 hour tablet, Take 3 tablets (150 mg total) by mouth 2 (two) times daily. (Patient taking differently: Take 150 mg by mouth 2 (two) times daily.), Disp: 540 tablet, Rfl: 3   levothyroxine (SYNTHROID) 125 MCG tablet, Take 0.5 tablets (62.5 mcg total) by mouth daily. (Patient taking differently: Take 62.5 mcg by mouth daily.), Disp: , Rfl:    Lycopene 10 MG CAPS, Take by mouth., Disp: , Rfl:    MAGNESIUM PO, Take 1 tablet by mouth daily., Disp: , Rfl:    Menaquinone-7 (VITAMIN K2) 100 MCG CAPS, Take 1 capsule by mouth daily., Disp: , Rfl:    Metoprolol Tartrate 75 MG TABS, Take 1 tablet (75 mg total) by mouth 2 (two) times daily., Disp: 60 tablet, Rfl: 11   Multiple Vitamins-Minerals (CENTRUM PO), Take 1 tablet by mouth daily.  , Disp: , Rfl:    Omega-3 Fatty Acids (OMEGA-3 FISH OIL PO), Take 1 capsule by mouth daily., Disp: , Rfl:    pantoprazole (PROTONIX) 40 MG tablet, Take 1 tablet (40 mg total) by mouth daily. (Patient taking differently: Take 40 mg by mouth daily before breakfast.), Disp: 90 tablet, Rfl: 1   Probiotic Product (PROBIOTIC DAILY) CAPS, Take 1 capsule by mouth daily., Disp: , Rfl:    progesterone (PROMETRIUM) 100 MG capsule, Take 1 capsule (100 mg total) by mouth daily for 15 days per month., Disp: 45 capsule, Rfl: 5   Quercetin 500 MG CAPS, Take 1 capsule  by mouth daily., Disp: , Rfl:    rosuvastatin (CRESTOR) 40 MG tablet, Take 1 tablet (40 mg total) by mouth at bedtime., Disp: 90 tablet, Rfl: 3   sodium bicarbonate 325 MG tablet, Take 325 mg by mouth 2 (two) times daily., Disp: , Rfl:    Vitamin D, Ergocalciferol, (DRISDOL) 1.25 MG (50000 UNIT) CAPS capsule, Take 1 capsule (50,000 Units total) by mouth every OTHER week as directed (Patient taking differently: Take 50,000 Units by mouth See admin instructions. Take 1 capsule (50,000 units) by mouth every other week.), Disp: 20 capsule, Rfl: 1   VITAMIN E PO, Take 1 capsule by mouth daily., Disp: , Rfl:    zoledronic acid (RECLAST) 5 MG/100ML SOLN injection, Inject 5 mg into the vein once., Disp: , Rfl:   Review of Systems  Review of Systems  Constitutional: Negative for fever, chills, weight loss, malaise/fatigue and diaphoresis.  HENT: Negative for hearing loss, ear pain, nosebleeds, congestion, sore throat, neck pain, tinnitus and ear discharge.   Eyes: Negative for blurred vision, double vision, photophobia, pain, discharge and redness.  Respiratory: Negative for cough, hemoptysis, sputum production, shortness of breath, wheezing and stridor.   Cardiovascular: Negative for chest pain, palpitations, orthopnea, claudication, leg swelling and PND.  Gastrointestinal: negative for abdominal pain. Negative for heartburn, nausea, vomiting, diarrhea, constipation, blood in stool and melena.  Genitourinary: Negative for dysuria, urgency, frequency, hematuria and flank pain.  Musculoskeletal: Negative for myalgias, back pain, joint pain and falls.  Skin: Negative for itching and rash.  Neurological: Negative for dizziness, tingling, tremors, sensory change, speech change, focal weakness,  seizures, loss of consciousness, weakness and headaches.  Endo/Heme/Allergies: Negative for environmental allergies and polydipsia. Does not bruise/bleed easily.  Psychiatric/Behavioral: Negative for depression,  suicidal ideas, hallucinations, memory loss and substance abuse. The patient is not nervous/anxious and does not have insomnia.        Objective:  Blood pressure 120/74, pulse 76, height 4\' 11"  (1.499 m), weight 137 lb (62.1 kg).   Physical Exam  Vitals reviewed. Constitutional: She is oriented to person, place, and time. She appears well-developed and well-nourished.  HENT:  Head: Normocephalic and atraumatic.        Right Ear: External ear normal.  Left Ear: External ear normal.  Nose: Nose normal.  Mouth/Throat: Oropharynx is clear and moist.  Eyes: Conjunctivae and EOM are normal. Pupils are equal, round, and reactive to light. Right eye exhibits no discharge. Left eye exhibits no discharge. No scleral icterus.  Neck: Normal range of motion. Neck supple. No tracheal deviation present. No thyromegaly present.  Cardiovascular: Normal rate, regular rhythm, normal heart sounds and intact distal pulses.  Exam reveals no gallop and no friction rub.   No murmur heard. Respiratory: Effort normal and breath sounds normal. No respiratory distress. She has no wheezes. She has no rales. She exhibits no tenderness.  GI: Soft. Bowel sounds are normal. She exhibits no distension and no mass. There is no tenderness. There is no rebound and no guarding.  Genitourinary:  Breasts no masses skin changes or nipple changes bilaterally      Vulva is normal without lesions Vagina is pink moist without discharge Cervix normal in appearance and pap is done Uterus is normal size shape and contour Adnexa is negative with normal sized ovaries   Musculoskeletal: Normal range of motion. She exhibits no edema and no tenderness.  Neurological: She is alert and oriented to person, place, and time. She has normal reflexes. She displays normal reflexes. No cranial nerve deficit. She exhibits normal muscle tone. Coordination normal.  Skin: Skin is warm and dry. No rash noted. No erythema. No pallor.  Psychiatric: She  has a normal mood and affect. Her behavior is normal. Judgment and thought content normal.       Medications Ordered at today's visit: No orders of the defined types were placed in this encounter.   Other orders placed at today's visit: No orders of the defined types were placed in this encounter.     ASSESSMENT + PLAN     ICD-10-CM   1. Well woman exam with routine gynecological exam  Z01.419    repeat Pap 3 years if normal, continue yearly evals because of HRT    2. Current long-term use of postmenopausal hormone replacement therapy  Z79.890    cpntinue Climara 0.05 mg + Prometrium 100 mg 15 days/month for symptomatic relief of vasomotor symptoms    3. Encounter for gynecological examination with Papanicolaou smear of cervix  Z01.419 Cytology - PAP( [facility])           Return in about 1 year (around 03/17/2024) for Follow up HRT.

## 2023-03-19 LAB — CYTOLOGY - PAP
Chlamydia: NEGATIVE
Comment: NEGATIVE
Comment: NEGATIVE
Comment: NORMAL
Diagnosis: NEGATIVE
High risk HPV: NEGATIVE
Neisseria Gonorrhea: NEGATIVE

## 2023-03-24 ENCOUNTER — Telehealth: Payer: Self-pay | Admitting: Cardiology

## 2023-03-24 NOTE — Telephone Encounter (Signed)
Pt came in office and stated that the last time she was seen here in office was by Cadence Furth and she refilled an RX for fenofibrate (TRICOR) 145 MG , pt stated that she normally gets this filled by her PCP. Pt stated that her RX is for 54MG  not the 145MG  that was called in. She is very upset and does not want to take this medication at triple the amount she was taking. She is completely out of her 54MG  RX and cannot take her medication. [phone] is the best number to reach the pt.

## 2023-03-24 NOTE — Telephone Encounter (Signed)
[facility] contacted to clarify fenofibrate dose. Spoke with Mellody Dance who verified fenofibrate dose was sent for 145 mg by Cadence Furth on 03/08/2023. Per Mellody Dance, prior to 03/08/2023, fenofibrate 54 mg daily had been prescribed by Dr. Nita Sells. Per Mellody Dance, fenofibrate 54 mg was filled on 03/21/2023 by Dr. Margo Aye.   Patient contacted and informed to stay on dose she has been taking which is fenofibrate 54 mg since no changes to dose were requested by our office. Verbalized understanding. Medication profile updated.

## 2023-03-26 ENCOUNTER — Encounter: Payer: Self-pay | Admitting: Cardiology

## 2023-03-28 ENCOUNTER — Encounter: Payer: Self-pay | Admitting: Cardiology

## 2023-03-28 ENCOUNTER — Encounter: Payer: Self-pay | Admitting: Obstetrics & Gynecology

## 2023-04-15 ENCOUNTER — Ambulatory Visit: Payer: Medicaid Other | Admitting: Pulmonary Disease

## 2023-04-15 ENCOUNTER — Ambulatory Visit (HOSPITAL_BASED_OUTPATIENT_CLINIC_OR_DEPARTMENT_OTHER): Payer: Medicaid Other | Admitting: Pulmonary Disease

## 2023-04-15 ENCOUNTER — Encounter: Payer: Self-pay | Admitting: Pulmonary Disease

## 2023-04-15 VITALS — BP 136/74 | HR 71 | Temp 98.4°F | Ht 59.0 in | Wt 139.8 lb

## 2023-04-15 DIAGNOSIS — J849 Interstitial pulmonary disease, unspecified: Secondary | ICD-10-CM

## 2023-04-15 LAB — PULMONARY FUNCTION TEST
DL/VA % pred: 116 %
DL/VA: 5.18 ml/min/mmHg/L
DLCO cor % pred: 67 %
DLCO cor: 11.74 ml/min/mmHg
DLCO unc % pred: 67 %
DLCO unc: 11.74 ml/min/mmHg
FEF 25-75 Post: 2.61 L/s
FEF 25-75 Pre: 2.06 L/s
FEF2575-%Change-Post: 26 %
FEF2575-%Pred-Post: 108 %
FEF2575-%Pred-Pre: 85 %
FEV1-%Change-Post: 1 %
FEV1-%Pred-Post: 62 %
FEV1-%Pred-Pre: 61 %
FEV1-Post: 1.45 L
FEV1-Pre: 1.42 L
FEV1FVC-%Change-Post: 0 %
FEV1FVC-%Pred-Pre: 115 %
FEV6-%Change-Post: 1 %
FEV6-%Pred-Post: 55 %
FEV6-%Pred-Pre: 54 %
FEV6-Post: 1.56 L
FEV6-Pre: 1.54 L
FEV6FVC-%Pred-Post: 102 %
FEV6FVC-%Pred-Pre: 102 %
FVC-%Change-Post: 1 %
FVC-%Pred-Post: 54 %
FVC-%Pred-Pre: 53 %
FVC-Post: 1.57 L
FVC-Pre: 1.55 L
Post FEV1/FVC ratio: 92 %
Post FEV6/FVC ratio: 100 %
Pre FEV1/FVC ratio: 92 %
Pre FEV6/FVC Ratio: 100 %
RV % pred: 81 %
RV: 1.28 L
TLC % pred: 71 %
TLC: 3.09 L

## 2023-04-15 NOTE — Patient Instructions (Signed)
VISIT SUMMARY:  During your follow-up visit, we discussed your overall health improvements, particularly in relation to your lung condition and mobility. You have made significant progress since stopping work and starting regular exercise. We also reviewed your upcoming cataract surgery and confirmed that your femur fracture has healed well.  YOUR PLAN:  -HYPERSENSITIVITY PNEUMONITIS: Hypersensitivity pneumonitis is a condition where your lungs become inflamed due to inhaling certain substances. Your lung function has improved since you stopped working and started treatment with prednisone. We will continue with your current management plan and monitor your progress. A CT scan will be scheduled in 6 months to check on your condition.  -FEMUR FRACTURE: Your femur fracture, which required surgery last December, has healed well, and you no longer have any mobility issues. No further action is needed for this issue.  -CATARACT SURGERY: Cataract surgery is a procedure to remove the cloudy lens in your eye and replace it with an artificial one. Your surgery is scheduled for April 25, 2023, and there are no specific actions required from a pulmonary perspective at this time.  INSTRUCTIONS:  Please follow up in 6 months for a review of your condition and a CT scan to assess your lung health.

## 2023-04-15 NOTE — Progress Notes (Signed)
Latoya Bautista    [id number]    1970/07/02  Primary Care Physician:Hall, Kathleene Hazel, MD  Referring Physician: Benita Stabile, MD [id number] [name],  [place] [address]  Chief complaint:  Follow-up for hypersensitivity pneumonitis Exposure to birds and mold  HPI: 52 y.o.  with seizure disorder, hepatitis C, cirrhosis, tongue cancer referred for evaluation of abnormal lung imaging, dyspnea  Complains of dyspnea on exertion for the past 6 months.  No cough, sputum production, wheezing  She has history of childhood ALL with bone marrow transplant x2, whole body radiation Tongue cancer with partial glossectomy on January 18, 2021 with intermittent bleeding.  Her ENT doctor is at [facility].  Recent PET scan did not show any residual cancer but there was concern for bilateral interstitial infiltrates suggestive of interstitial lung disease.  Started on prednisone at 50 mg in the beginning of January 2023 for a CT scan showing changes consistent with hypersensitivity pneumonitis and hypersensitivity panel with positive for pigeon and mold  She got rid of the parakeets in December 2022. Finished prednisone taper in May 2023.  Went on full disability in the summer 2023 and stayed away from work Admitted in December 2023 after accidental mechanical fall.  Noted to have AKI, chronic hyponatremia.  Underwent intramedullary nailing on 05/06/2021.   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  No feather pillows or comforter Smoking history: Never smoker Travel history: No significant travel history Relevant family history: No family history of lung disease  Interim history: Discussed the use of AI scribe software for clinical note transcription with the patient, who gave verbal consent to proceed.  The patient,  with a history of hypersensitivity pneumonitis, presents for a follow-up visit. She reports an improvement in her overall health, noting that she no longer requires a cane for mobility. This improvement coincides with her cessation of work, which previously exposed her to harmful chemicals. She has also been exercising regularly.  The patient had a femur fracture in December of the previous year, which required surgical intervention. The fracture has since healed, and she no longer experiences any related mobility issues.  In addition to her lung condition, the patient is scheduled for cataract surgery in the near future. She expresses no concerns about this upcoming procedure.   Outpatient Encounter Medications as of 04/15/2023  Medication Sig   Ascorbic Acid (VITAMIN C) 1000 MG tablet Take 1,000 mg by mouth daily.   cholecalciferol (VITAMIN D) 1000 units tablet Take 1,000 Units by mouth daily.   Coenzyme Q10 (COQ-10) 100 MG CAPS Take 1 capsule by mouth daily.   estradiol (CLIMARA - DOSED IN MG/24 HR) 0.05 mg/24hr patch Place 0.05 mg onto the skin once a week. Wednesday's   famotidine (PEPCID) 20 MG tablet Take 1 tablet (20 mg total) by mouth 2 (two) times daily. (Patient taking differently: Take 20 mg by mouth daily.)   fenofibrate 54 MG tablet Take 54 mg by mouth daily.   folic acid (FOLVITE) 1 MG tablet TAKE 1 TABLET ONCE DAILY.   Garlic 1000 MG CAPS Take 1 capsule by mouth daily.   glucosamine-chondroitin 500-400 MG tablet Take 1 tablet by mouth 2 (two) times daily.   lamoTRIgine (LAMICTAL XR) 50 MG 24 hour tablet Take 3  tablets (150 mg total) by mouth 2 (two) times daily. (Patient taking differently: Take 150 mg by mouth 2 (two) times daily.)   levothyroxine (SYNTHROID) 125 MCG tablet Take 0.5 tablets (62.5 mcg total) by mouth daily. (Patient taking differently: Take 62.5 mcg by mouth daily.)   Lycopene 10 MG CAPS Take by mouth.   MAGNESIUM PO Take 1 tablet by mouth daily.   Menaquinone-7  (VITAMIN K2) 100 MCG CAPS Take 1 capsule by mouth daily.   Metoprolol Tartrate 75 MG TABS Take 1 tablet (75 mg total) by mouth 2 (two) times daily.   Multiple Vitamins-Minerals (CENTRUM PO) Take 1 tablet by mouth daily.     Omega-3 Fatty Acids (OMEGA-3 FISH OIL PO) Take 1 capsule by mouth daily.   pantoprazole (PROTONIX) 40 MG tablet Take 1 tablet (40 mg total) by mouth daily. (Patient taking differently: Take 40 mg by mouth daily before breakfast.)   Probiotic Product (PROBIOTIC DAILY) CAPS Take 1 capsule by mouth daily.   progesterone (PROMETRIUM) 100 MG capsule Take 1 capsule (100 mg total) by mouth daily for 15 days per month.   Quercetin 500 MG CAPS Take 1 capsule by mouth daily.   rosuvastatin (CRESTOR) 40 MG tablet Take 1 tablet (40 mg total) by mouth at bedtime.   sodium bicarbonate 325 MG tablet Take 325 mg by mouth 2 (two) times daily.   Vitamin D, Ergocalciferol, (DRISDOL) 1.25 MG (50000 UNIT) CAPS capsule Take 1 capsule (50,000 Units total) by mouth every OTHER week as directed (Patient taking differently: Take 50,000 Units by mouth See admin instructions. Take 1 capsule (50,000 units) by mouth every other week.)   VITAMIN E PO Take 1 capsule by mouth daily.   zoledronic acid (RECLAST) 5 MG/100ML SOLN injection Inject 5 mg into the vein once.   No facility-administered encounter medications on file as of 04/15/2023.   Physical Exam: Blood pressure 136/74, pulse 71, temperature 98.4 F (36.9 C), temperature source Oral, height 4\' 11"  (1.499 m), weight 139 lb 12.8 oz (63.4 kg), SpO2 97%. Gen:      No acute distress HEENT:  EOMI, sclera anicteric Neck:     No masses; no thyromegaly Lungs:    Clear to auscultation bilaterally; normal respiratory effort CV:         Regular rate and rhythm; no murmurs Abd:      + bowel sounds; soft, non-tender; no palpable masses, no distension Ext:    No edema; adequate peripheral perfusion Skin:      Warm and dry; no rash Neuro: alert and oriented  x 3 Psych: normal mood and affect   Data Reviewed: Imaging: PET scan 12/07/2020-emphysematous changes, bilateral groundglass opacities concerning for interstitial lung disease.  High-res CT 04/10/2021-interstitial lung disease and alternate pattern.  NSIP versus chronic hypersensitivity pneumonitis, aortic and coronary atherosclerosis.  High-res CT 10/26/2021-minimal progression of pulmonary fibrosis in alternate pattern.  High-res CT 07/09/2022-stable pulmonary fibrosis and alternate pattern.   I have reviewed the images personally.  PFTs: 03/19/2021 FVC 1.01 [34%], FEV1 0.97 [41%], F/F 96, TLC 1.82 [42%], DLCO 4.42 [25%]  04/09/2022 FVC 1.37 [46%], FEV1 1.31 [56%], F/F96, TLC 2.83 [65%], DLCO 9.57 [54%] Severe restriction  04/15/2023 FVC 1.57 [54%], FEV1 1.45 [62%], F/F92, TLC 3.09 [91%], DLCO 11.74 [67%] Moderate restriction, diffusion defect  Cardiac: Echocardiogram 07/28/2017 LVEF 55-60%, grade 1 diastolic dysfunction.  PA systolic pressure could not be accurately estimated  Labs: CTD serologies 05/03/2021-negative Hypersensitivity pneumonitis panel 04/30/2021-positive for A pullulans antibody and pigeon serum antibody  Assessment:  Hypersensitivity pneumonitis Likely from patient exposure and mold exposure given positive antibodies for A pullulans and pigeon serum.  She has gotten rid of the birds in December 2022. 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  She is avoiding work exposure by taking disability.  Finished prednisone taper in May 2023 Improvement in lung function tests since last year, likely due to cessation of work exposure and treatment with prednisone in May 2023. Moderate impairment remains. -Continue current management and monitoring. -Order CT scan in 6 months to assess progress.  Femur Fracture Healed with metal rod  placement. No current issues reported. -No further action required.  Cataract Surgery Scheduled for 04/25/2023. -No specific action required from a pulmonary perspective.  Follow-up in 6 months.    Plan/Recommendations: Follow-up high-res CT in 6 months  Chilton Greathouse MD [facility] 04/15/2023, 1:35 PM  CC: Benita Stabile, MD

## 2023-04-15 NOTE — Patient Instructions (Signed)
Full PFT Performed Today  

## 2023-04-15 NOTE — Progress Notes (Signed)
Full PFT Performed Today  

## 2023-04-21 ENCOUNTER — Encounter (HOSPITAL_COMMUNITY): Payer: Self-pay

## 2023-04-21 ENCOUNTER — Encounter (HOSPITAL_COMMUNITY)
Admission: RE | Admit: 2023-04-21 | Discharge: 2023-04-21 | Disposition: A | Discharge status: Home / Self Care | Payer: Medicaid Other | Source: Ambulatory Visit | Attending: Ophthalmology | Admitting: Ophthalmology

## 2023-04-23 NOTE — H&P (Signed)
Surgical History & Physical  Patient Name: Latoya Bautista  DOB: May 01, 1971  Surgery: Cataract extraction with intraocular lens implant phacoemulsification; Right Eye Surgeon: [name] MD Surgery Date: 04/25/2023 Pre-Op Date: 03/10/2023  HPI: A 43 Yr. old female patient present for cataract eval per Dr. [name]. 1. The patient complains of difficulty when viewing TV, reading closed caption, difficulties seeing street signs, reading fine print, which began many years ago. Both eyes are affected. The episode is constant. The condition's severity is worsening. This is negatively affecting the patient's quality of life and the patient is unable to function adequately in life with the current level of vision. Patient had cancer has a child. She states she has had cataracts since then due to steriod use. HPI was performed by [name] .  Medical History: Cataracts  Arthritis Cancer Gastroesophageal reflux disease, Inflammatory disease of liver, Osteopenia, cirrhosis, seizure Heart Problem High Blood Pressure LDL Lung Problems Thyroid Problems  Review of Systems Cardiovascular High Blood Pressure, murmur Endocrine thyroid disease Gastrointestinal acid reflux Respiratory pulmonary fibrosis All recorded systems are negative except as noted above.   Social Never smoked  Medication famotidine ,  levothyroxine ,  progesterone micronized ,  estradiol ,  pantoprazole ,  folic acid ,  lamotrigine   Sx/Procedures Pre bone marrow transplant, Leg surgery, Part of tongue due to cancer, Bone spur, Bunectomy  Drug Allergies  adhesive tape ,  Keppra ,  asparaginase ,  Briviact ,  simvastatin   History & Physical: Heent: cataracts NECK: supple without bruits LUNGS: lungs clear to auscultation CV: regular rate and rhythm Abdomen: soft and non-tender  Impression & Plan: Assessment: 1.  COMBINED FORMS AGE RELATED CATARACT; Both Eyes (H25.813) 2.  BLEPHARITIS; Right Upper Lid, Right  Lower Lid, Left Upper Lid, Left Lower Lid (H01.001, H01.002,H01.004,H01.005)  Plan: 1.  Cataract accounts for the patient's decreased vision. This visual impairment is not correctable with a tolerable change in glasses or contact lenses. Cataract surgery with an implantation of a new lens should significantly improve the visual and functional status of the patient. Discussed all risks, benefits, alternatives, and potential complications. Discussed the procedures and recovery. Patient desires to have surgery. A-scan ordered and performed today for intra-ocular lens calculations. The surgery will be performed in order to improve vision for driving, reading, and for eye examinations. Recommend phacoemulsification with intra-ocular lens. Recommend Dextenza for post-operative pain and inflammation. Right Eye worse - first. Dilates well - shugarcaine by protocol.  2.  Blepharitis is present - recommend regular lid cleaning.

## 2023-04-24 ENCOUNTER — Encounter: Payer: Self-pay | Admitting: Internal Medicine

## 2023-04-25 ENCOUNTER — Ambulatory Visit (HOSPITAL_BASED_OUTPATIENT_CLINIC_OR_DEPARTMENT_OTHER): Payer: Medicaid Other | Admitting: Certified Registered"

## 2023-04-25 ENCOUNTER — Ambulatory Visit (HOSPITAL_COMMUNITY): Payer: Medicaid Other | Admitting: Certified Registered"

## 2023-04-25 ENCOUNTER — Encounter (HOSPITAL_COMMUNITY)
Admission: RE | Disposition: A | Discharge status: Home / Self Care | Payer: Self-pay | Source: Home / Self Care | Attending: Ophthalmology

## 2023-04-25 ENCOUNTER — Ambulatory Visit (HOSPITAL_COMMUNITY)
Admission: RE | Admit: 2023-04-25 | Discharge: 2023-04-25 | Disposition: A | Discharge status: Home / Self Care | Payer: Medicaid Other | Attending: Ophthalmology | Admitting: Ophthalmology

## 2023-04-25 DIAGNOSIS — I2510 Atherosclerotic heart disease of native coronary artery without angina pectoris: Secondary | ICD-10-CM | POA: Diagnosis not present

## 2023-04-25 DIAGNOSIS — H0100B Unspecified blepharitis left eye, upper and lower eyelids: Secondary | ICD-10-CM | POA: Insufficient documentation

## 2023-04-25 DIAGNOSIS — I10 Essential (primary) hypertension: Secondary | ICD-10-CM

## 2023-04-25 DIAGNOSIS — E039 Hypothyroidism, unspecified: Secondary | ICD-10-CM

## 2023-04-25 DIAGNOSIS — R569 Unspecified convulsions: Secondary | ICD-10-CM | POA: Insufficient documentation

## 2023-04-25 DIAGNOSIS — H0100A Unspecified blepharitis right eye, upper and lower eyelids: Secondary | ICD-10-CM | POA: Diagnosis not present

## 2023-04-25 DIAGNOSIS — H25811 Combined forms of age-related cataract, right eye: Secondary | ICD-10-CM | POA: Insufficient documentation

## 2023-04-25 DIAGNOSIS — K219 Gastro-esophageal reflux disease without esophagitis: Secondary | ICD-10-CM | POA: Diagnosis not present

## 2023-04-25 HISTORY — PX: CATARACT EXTRACTION W/PHACO: SHX586

## 2023-04-25 SURGERY — PHACOEMULSIFICATION, CATARACT, WITH IOL INSERTION
Anesthesia: Monitor Anesthesia Care | Site: Eye | Laterality: Right

## 2023-04-25 MED ORDER — LIDOCAINE HCL 3.5 % OP GEL
1.0000 | Freq: Once | OPHTHALMIC | Status: AC
  Administered 2023-04-25: 1 via OPHTHALMIC

## 2023-04-25 MED ORDER — TROPICAMIDE 1 % OP SOLN
1.0000 [drp] | OPHTHALMIC | Status: AC | PRN
  Administered 2023-04-25 (×3): 1 [drp] via OPHTHALMIC

## 2023-04-25 MED ORDER — MIDAZOLAM HCL 2 MG/2ML IJ SOLN
INTRAMUSCULAR | Status: DC | PRN
  Administered 2023-04-25: 2 mg via INTRAVENOUS

## 2023-04-25 MED ORDER — POVIDONE-IODINE 5 % OP SOLN
OPHTHALMIC | Status: DC | PRN
  Administered 2023-04-25: 1 via OPHTHALMIC

## 2023-04-25 MED ORDER — MOXIFLOXACIN HCL 5 MG/ML IO SOLN
INTRAOCULAR | Status: DC | PRN
  Administered 2023-04-25: .3 mL via INTRACAMERAL

## 2023-04-25 MED ORDER — MIDAZOLAM HCL 2 MG/2ML IJ SOLN
INTRAMUSCULAR | Status: AC
  Filled 2023-04-25: qty 2

## 2023-04-25 MED ORDER — STERILE WATER FOR IRRIGATION IR SOLN
Status: DC | PRN
  Administered 2023-04-25: 1

## 2023-04-25 MED ORDER — SODIUM CHLORIDE 0.9% FLUSH
3.0000 mL | INTRAVENOUS | Status: DC | PRN
  Administered 2023-04-25: 10 mL via INTRAVENOUS

## 2023-04-25 MED ORDER — SODIUM HYALURONATE 23MG/ML IO SOSY
PREFILLED_SYRINGE | INTRAOCULAR | Status: DC | PRN
  Administered 2023-04-25: .6 mL via INTRAOCULAR

## 2023-04-25 MED ORDER — LIDOCAINE HCL (PF) 1 % IJ SOLN
INTRAOCULAR | Status: DC | PRN
  Administered 2023-04-25: 1 mL via OPHTHALMIC

## 2023-04-25 MED ORDER — TETRACAINE HCL 0.5 % OP SOLN
1.0000 [drp] | OPHTHALMIC | Status: AC | PRN
  Administered 2023-04-25 (×3): 1 [drp] via OPHTHALMIC

## 2023-04-25 MED ORDER — SODIUM HYALURONATE 10 MG/ML IO SOLUTION
PREFILLED_SYRINGE | INTRAOCULAR | Status: DC | PRN
  Administered 2023-04-25: .85 mL via INTRAOCULAR

## 2023-04-25 MED ORDER — EPINEPHRINE PF 1 MG/ML IJ SOLN
INTRAOCULAR | Status: DC | PRN
  Administered 2023-04-25: 500 mL

## 2023-04-25 MED ORDER — BSS IO SOLN
INTRAOCULAR | Status: DC | PRN
  Administered 2023-04-25: 15 mL via INTRAOCULAR

## 2023-04-25 MED ORDER — SODIUM CHLORIDE 0.9% FLUSH
3.0000 mL | Freq: Two times a day (BID) | INTRAVENOUS | Status: DC
Start: 2023-04-25 — End: 2023-04-25
  Administered 2023-04-25: 8 mL via INTRAVENOUS

## 2023-04-25 MED ORDER — PHENYLEPHRINE HCL 2.5 % OP SOLN
1.0000 [drp] | OPHTHALMIC | Status: AC | PRN
Start: 2023-04-25 — End: 2023-04-25
  Administered 2023-04-25 (×3): 1 [drp] via OPHTHALMIC

## 2023-04-25 SURGICAL SUPPLY — 13 items
CATARACT SUITE SIGHTPATH (MISCELLANEOUS) ×1
CLOTH BEACON ORANGE TIMEOUT ST (SAFETY) ×1 IMPLANT
EYE SHIELD UNIVERSAL CLEAR (GAUZE/BANDAGES/DRESSINGS) IMPLANT
FEE CATARACT SUITE SIGHTPATH (MISCELLANEOUS) ×1 IMPLANT
GLOVE BIOGEL PI IND STRL 7.0 (GLOVE) ×2 IMPLANT
LENS IOL TECNIS EYHANCE 20.0 (Intraocular Lens) IMPLANT
NDL HYPO 18GX1.5 BLUNT FILL (NEEDLE) ×1 IMPLANT
NEEDLE HYPO 18GX1.5 BLUNT FILL (NEEDLE) ×1
PAD ARMBOARD 7.5X6 YLW CONV (MISCELLANEOUS) ×1 IMPLANT
POSITIONER HEAD 8X9X4 ADT (SOFTGOODS) ×1 IMPLANT
SYR TB 1ML LL NO SAFETY (SYRINGE) ×1 IMPLANT
TAPE PAPER 2X10 WHT MICROPORE (GAUZE/BANDAGES/DRESSINGS) IMPLANT
WATER STERILE IRR 250ML POUR (IV SOLUTION) ×1 IMPLANT

## 2023-04-25 NOTE — Anesthesia Preprocedure Evaluation (Signed)
Anesthesia Evaluation  Patient identified by MRN, date of birth, ID band Patient awake    Reviewed: Allergy & Precautions, H&P , NPO status , Patient's Chart, lab work & pertinent test results, reviewed documented beta blocker date and time   Airway Mallampati: II  TM Distance: >3 FB Neck ROM: full    Dental no notable dental hx.    Pulmonary neg pulmonary ROS   Pulmonary exam normal breath sounds clear to auscultation       Cardiovascular Exercise Tolerance: Good hypertension, + CAD and + DOE  negative cardio ROS + Valvular Problems/Murmurs  Rhythm:regular Rate:Normal     Neuro/Psych Seizures -,  negative neurological ROS  negative psych ROS   GI/Hepatic negative GI ROS, Neg liver ROS,GERD  ,,(+) Hepatitis -  Endo/Other  negative endocrine ROSHypothyroidism    Renal/GU Renal diseasenegative Renal ROS  negative genitourinary   Musculoskeletal   Abdominal   Peds  Hematology negative hematology ROS (+) Blood dyscrasia, anemia   Anesthesia Other Findings   Reproductive/Obstetrics negative OB ROS                             Anesthesia Physical Anesthesia Plan  ASA: 3  Anesthesia Plan: MAC   Post-op Pain Management:    Induction:   PONV Risk Score and Plan:   Airway Management Planned:   Additional Equipment:   Intra-op Plan:   Post-operative Plan:   Informed Consent: I have reviewed the patients History and Physical, chart, labs and discussed the procedure including the risks, benefits and alternatives for the proposed anesthesia with the patient or authorized representative who has indicated his/her understanding and acceptance.     Dental Advisory Given  Plan Discussed with: CRNA  Anesthesia Plan Comments:        Anesthesia Quick Evaluation

## 2023-04-25 NOTE — Discharge Instructions (Signed)
Please discharge patient when stable, will follow up today with Dr. Carnella Fryman at the [facility] office immediately following discharge.  Leave shield in place until visit.  All paperwork with discharge instructions will be given at the office.  [facility] Address:  [address]  [place], [place] [address]  

## 2023-04-25 NOTE — Anesthesia Postprocedure Evaluation (Signed)
Anesthesia Post Note  Patient: LANIESHA SMERIGLIO  Procedure(s) Performed: CATARACT EXTRACTION PHACO AND INTRAOCULAR LENS PLACEMENT (IOC) (Right: Eye)  Patient location during evaluation: Phase II Anesthesia Type: MAC Level of consciousness: awake Pain management: pain level controlled Vital Signs Assessment: post-procedure vital signs reviewed and stable Respiratory status: spontaneous breathing and respiratory function stable Cardiovascular status: blood pressure returned to baseline and stable Postop Assessment: no headache and no apparent nausea or vomiting Anesthetic complications: no Comments: Late entry   No notable events documented.   Last Vitals:  Vitals:   04/25/23 0733 04/25/23 0841  BP: 113/62 (!) 113/55  Pulse: (!) 57 64  Resp: 16 (!) 22  Temp: 36.7 C 36.6 C  SpO2: 100% 100%    Last Pain:  Vitals:   04/25/23 0841  TempSrc: Oral  PainSc: 0-No pain                 Windell Norfolk

## 2023-04-25 NOTE — Transfer of Care (Signed)
Immediate Anesthesia Transfer of Care Note  Patient: Latoya Bautista  Procedure(s) Performed: CATARACT EXTRACTION PHACO AND INTRAOCULAR LENS PLACEMENT (IOC) (Right: Eye)  Patient Location: Short Stay  Anesthesia Type:MAC  Level of Consciousness: awake, alert , and oriented  Airway & Oxygen Therapy: Patient Spontanous Breathing  Post-op Assessment: Report given to RN and Post -op Vital signs reviewed and stable  Post vital signs: Reviewed and stable  Last Vitals:  Vitals Value Taken Time  BP    Temp    Pulse    Resp    SpO2      Last Pain:  Vitals:   04/25/23 0733  TempSrc: Oral  PainSc: 0-No pain      Patients Stated Pain Goal: 5 (04/25/23 0733)  Complications: No notable events documented.

## 2023-04-25 NOTE — Op Note (Signed)
Date of procedure: 04/25/23  Pre-operative diagnosis:  Visually significant combined form age-related cataract, Right Eye (H25.811)  Post-operative diagnosis:  Visually significant combined form age-related cataract, Right Eye (H25.811)  Procedure: Removal of cataract via phacoemulsification and insertion of intra-ocular lens Johnson [employer] DIB00 +20.0D into the capsular bag of the Right Eye  Attending surgeon: Rudy Jew. Nason Conradt, MD, MA  Anesthesia: MAC, Topical Akten  Complications: None  Estimated Blood Loss: <69mL (minimal)  Specimens: None  Implants: As above  Indications:  Visually significant age-related cataract, Right Eye  Procedure:  The patient was seen and identified in the pre-operative area. The operative eye was identified and dilated.  The operative eye was marked.  Topical anesthesia was administered to the operative eye.     The patient was then to the operative suite and placed in the supine position.  A timeout was performed confirming the patient, procedure to be performed, and all other relevant information.   The patient's face was prepped and draped in the usual fashion for intra-ocular surgery.  A lid speculum was placed into the operative eye and the surgical microscope moved into place and focused.  A superotemporal paracentesis was created using a 20 gauge paracentesis blade.  Shugarcaine was injected into the anterior chamber.  Viscoelastic was injected into the anterior chamber.  A temporal clear-corneal main wound incision was created using a 2.70mm microkeratome.  A continuous curvilinear capsulorrhexis was initiated using an irrigating cystitome and completed using capsulorrhexis forceps.  Hydrodissection and hydrodeliniation were performed.  Viscoelastic was injected into the anterior chamber.  A phacoemulsification handpiece and a chopper as a second instrument were used to remove the nucleus and epinucleus. The irrigation/aspiration handpiece was used to  remove any remaining cortical material.   The capsular bag was reinflated with viscoelastic, checked, and found to be intact.  The intraocular lens was inserted into the capsular bag.  The irrigation/aspiration handpiece was used to remove any remaining viscoelastic.  The clear corneal wound and paracentesis wounds were then hydrated and checked with Weck-Cels to be watertight. 0.5mL of Moxfloxacin was injected into the anterior chamber. The lid-speculum was removed.  The drape was removed.  The patient's face was cleaned with a wet and dry 4x4. A clear shield was taped over the eye. The patient was taken to the post-operative care unit in good condition, having tolerated the procedure well.  Post-Op Instructions: The patient will follow up at [facility] for a same day post-operative evaluation and will receive all other orders and instructions.

## 2023-04-25 NOTE — Interval H&P Note (Signed)
History and Physical Interval Note:  04/25/2023 8:16 AM  Latoya Bautista  has presented today for surgery, with the diagnosis of combined forms age related cataract, right eye.  The various methods of treatment have been discussed with the patient and family. After consideration of risks, benefits and other options for treatment, the patient has consented to  Procedure(s): CATARACT EXTRACTION PHACO AND INTRAOCULAR LENS PLACEMENT (IOC) (Right) as a surgical intervention.  The patient's history has been reviewed, patient examined, no change in status, stable for surgery.  I have reviewed the patient's chart and labs.  Questions were answered to the patient's satisfaction.     [name]

## 2023-04-25 NOTE — Anesthesia Procedure Notes (Signed)
Procedure Name: MAC Date/Time: 04/25/2023 8:20 AM  Performed by: Julian Reil, CRNAPre-anesthesia Checklist: Patient identified, Emergency Drugs available, Patient being monitored and Suction available Patient Re-evaluated:Patient Re-evaluated prior to induction Oxygen Delivery Method: Nasal cannula Placement Confirmation: positive ETCO2

## 2023-04-28 ENCOUNTER — Encounter (HOSPITAL_COMMUNITY): Payer: Self-pay | Admitting: Ophthalmology

## 2023-05-06 ENCOUNTER — Encounter (HOSPITAL_COMMUNITY)
Admission: RE | Admit: 2023-05-06 | Discharge: 2023-05-06 | Disposition: A | Discharge status: Home / Self Care | Payer: Medicaid Other | Source: Ambulatory Visit | Attending: Ophthalmology | Admitting: Ophthalmology

## 2023-05-06 ENCOUNTER — Encounter (HOSPITAL_COMMUNITY): Payer: Self-pay

## 2023-05-08 NOTE — H&P (Signed)
 Surgical History & Physical  Patient Name: Latoya Bautista  DOB: [birth year]  Surgery: Cataract extraction with intraocular lens implant phacoemulsification; Left Eye Surgeon: Lynwood Hermann MD Surgery Date: 05/09/2023 Pre-Op Date: 05/05/2023  HPI: A 72 Yr. old female patient 1. The patient is returning after cataract surgery. The right eye is affected. Status post cataract surgery, which began 10 days ago: Since the last visit, the affected area is doing well. The patient's vision is improved. The condition's severity is constant. Patient is following medication instructions. The patient also presents for dull and blurred vision in the left eye. This has been present for several months. The patient has trouble with glare when driving. This is negatively affecting the patient's quality of life and the patient is unable to function adequately in life with the current level of vision. HPI Completed by Dr. Lynwood Hermann  Medical History: Cataracts  Arthritis Cancer Gastroesophageal reflux disease, Inflammatory disease of liver, osteopenia, cirrhosis, seizure Heart Problem High Blood Pressure LDL Lung Problems Thyroid  Problems  Review of Systems Cardiovascular High Blood Pressure, murmur Endocrine thyroid  disease Gastrointestinal acid reflux Respiratory pulmonary fibrosis All recorded systems are negative except as noted above.  Social Never smoked   Medication Prednisolon-moxiflox-bromf(pf),  famotidine  ,  levothyroxine  ,  progesterone  micronized ,  estradiol  ,  pantoprazole  ,  folic acid  ,  lamotrigine    Sx/Procedures Phaco c IOL OD,  Pre bone marrow transplant, Leg surgery, Part of tongue due to cancer, Bone spur, Bunectomy  Drug Allergies  adhesive tape ,  Keppra  ,  asparaginase ,  Briviact ,  simvastatin ,   History & Physical: Heent: cataract NECK: supple without bruits LUNGS: lungs clear to auscultation CV: regular rate and rhythm Abdomen: soft and  non-tender  Impression & Plan: Assessment: 1.  CATARACT EXTRACTION STATUS; Right Eye (Z98.41) 2.  COMBINED FORMS AGE RELATED CATARACT; Left Eye (H25.812) 3.  PCO; Right Eye (H26.491)  Plan: 1.  1 week after cataract surgery. Doing well with improved vision and normal eye pressure. Call with any problems or concerns. Continue Pred-Moxi-Brom 2x/day for 3 more weeks.  2.  Cataract accounts for the patient's decreased vision. This visual impairment is not correctable with a tolerable change in glasses or contact lenses. Cataract surgery with an implantation of a new lens should significantly improve the visual and functional status of the patient. Discussed all risks, benefits, alternatives, and potential complications. Discussed the procedures and recovery. Patient desires to have surgery. A-scan ordered and performed today for intra-ocular lens calculations. The surgery will be performed in order to improve vision for driving, reading, and for eye examinations. Recommend phacoemulsification with intra-ocular lens. Recommend Dextenza  for post-operative pain and inflammation. Left Eye. Surgery required to correct imbalance of vision. Dilates well - shugarcaine by protocol.  3.  Will address in 6 months after surgery.

## 2023-05-09 ENCOUNTER — Encounter (HOSPITAL_COMMUNITY)
Admission: RE | Disposition: A | Discharge status: Home / Self Care | Payer: Self-pay | Source: Home / Self Care | Attending: Ophthalmology

## 2023-05-09 ENCOUNTER — Encounter (HOSPITAL_COMMUNITY): Payer: Self-pay | Admitting: Ophthalmology

## 2023-05-09 ENCOUNTER — Ambulatory Visit (HOSPITAL_COMMUNITY): Payer: Medicaid Other | Admitting: Certified Registered"

## 2023-05-09 ENCOUNTER — Ambulatory Visit (HOSPITAL_COMMUNITY)
Admission: RE | Admit: 2023-05-09 | Discharge: 2023-05-09 | Disposition: A | Discharge status: Home / Self Care | Payer: Medicaid Other | Attending: Ophthalmology | Admitting: Ophthalmology

## 2023-05-09 DIAGNOSIS — R569 Unspecified convulsions: Secondary | ICD-10-CM | POA: Insufficient documentation

## 2023-05-09 DIAGNOSIS — Z9841 Cataract extraction status, right eye: Secondary | ICD-10-CM | POA: Diagnosis not present

## 2023-05-09 DIAGNOSIS — H26491 Other secondary cataract, right eye: Secondary | ICD-10-CM | POA: Diagnosis not present

## 2023-05-09 DIAGNOSIS — I2510 Atherosclerotic heart disease of native coronary artery without angina pectoris: Secondary | ICD-10-CM | POA: Diagnosis not present

## 2023-05-09 DIAGNOSIS — I10 Essential (primary) hypertension: Secondary | ICD-10-CM | POA: Diagnosis not present

## 2023-05-09 DIAGNOSIS — H25812 Combined forms of age-related cataract, left eye: Secondary | ICD-10-CM | POA: Diagnosis present

## 2023-05-09 DIAGNOSIS — K219 Gastro-esophageal reflux disease without esophagitis: Secondary | ICD-10-CM | POA: Diagnosis not present

## 2023-05-09 HISTORY — PX: CATARACT EXTRACTION W/PHACO: SHX586

## 2023-05-09 SURGERY — PHACOEMULSIFICATION, CATARACT, WITH IOL INSERTION
Anesthesia: Monitor Anesthesia Care | Site: Eye | Laterality: Left

## 2023-05-09 MED ORDER — SODIUM CHLORIDE 0.9% FLUSH: 3.0000 mL | INTRAVENOUS | Status: DC | PRN

## 2023-05-09 MED ORDER — STERILE WATER FOR IRRIGATION IR SOLN
Status: DC | PRN
  Administered 2023-05-09: 1

## 2023-05-09 MED ORDER — POVIDONE-IODINE 5 % OP SOLN
OPHTHALMIC | Status: DC | PRN
  Administered 2023-05-09: 1 via OPHTHALMIC

## 2023-05-09 MED ORDER — MIDAZOLAM HCL 2 MG/2ML IJ SOLN
INTRAMUSCULAR | Status: DC | PRN
  Administered 2023-05-09: 2 mg via INTRAVENOUS

## 2023-05-09 MED ORDER — LIDOCAINE HCL (PF) 1 % IJ SOLN
INTRAOCULAR | Status: DC | PRN
  Administered 2023-05-09: 1 mL via OPHTHALMIC

## 2023-05-09 MED ORDER — SODIUM HYALURONATE 23MG/ML IO SOSY
PREFILLED_SYRINGE | INTRAOCULAR | Status: DC | PRN
  Administered 2023-05-09: .6 mL via INTRAOCULAR

## 2023-05-09 MED ORDER — BSS IO SOLN
INTRAOCULAR | Status: DC | PRN
  Administered 2023-05-09: 15 mL via INTRAOCULAR

## 2023-05-09 MED ORDER — MIDAZOLAM HCL 2 MG/2ML IJ SOLN
INTRAMUSCULAR | Status: AC
Start: 2023-05-09 — End: ?
  Filled 2023-05-09: qty 2

## 2023-05-09 MED ORDER — EPINEPHRINE PF 1 MG/ML IJ SOLN
INTRAOCULAR | Status: DC | PRN
  Administered 2023-05-09: 500 mL

## 2023-05-09 MED ORDER — SODIUM HYALURONATE 10 MG/ML IO SOLUTION
PREFILLED_SYRINGE | INTRAOCULAR | Status: DC | PRN
  Administered 2023-05-09: .85 mL via INTRAOCULAR

## 2023-05-09 MED ORDER — LIDOCAINE HCL 3.5 % OP GEL
1.0000 | Freq: Once | OPHTHALMIC | Status: AC
  Administered 2023-05-09: 1 via OPHTHALMIC

## 2023-05-09 MED ORDER — PHENYLEPHRINE HCL 2.5 % OP SOLN
1.0000 [drp] | OPHTHALMIC | Status: AC | PRN
  Administered 2023-05-09 (×3): 1 [drp] via OPHTHALMIC

## 2023-05-09 MED ORDER — TETRACAINE HCL 0.5 % OP SOLN
1.0000 [drp] | OPHTHALMIC | Status: AC | PRN
  Administered 2023-05-09 (×3): 1 [drp] via OPHTHALMIC

## 2023-05-09 MED ORDER — SODIUM CHLORIDE 0.9% FLUSH
3.0000 mL | Freq: Two times a day (BID) | INTRAVENOUS | Status: DC
  Administered 2023-05-09: 8 mL via INTRAVENOUS

## 2023-05-09 MED ORDER — TROPICAMIDE 1 % OP SOLN
1.0000 [drp] | OPHTHALMIC | Status: AC | PRN
  Administered 2023-05-09 (×3): 1 [drp] via OPHTHALMIC

## 2023-05-09 MED ORDER — MOXIFLOXACIN HCL 5 MG/ML IO SOLN
INTRAOCULAR | Status: DC | PRN
  Administered 2023-05-09: .3 mL via INTRACAMERAL

## 2023-05-09 SURGICAL SUPPLY — 12 items
CATARACT SUITE SIGHTPATH (MISCELLANEOUS) ×1
CLOTH BEACON ORANGE TIMEOUT ST (SAFETY) ×1 IMPLANT
EYE SHIELD UNIVERSAL CLEAR (GAUZE/BANDAGES/DRESSINGS) IMPLANT
FEE CATARACT SUITE SIGHTPATH (MISCELLANEOUS) ×1 IMPLANT
GLOVE BIOGEL PI IND STRL 7.0 (GLOVE) ×2 IMPLANT
LENS IOL TECNIS EYHANCE 20.5 (Intraocular Lens) IMPLANT
NDL HYPO 18GX1.5 BLUNT FILL (NEEDLE) ×1 IMPLANT
NEEDLE HYPO 18GX1.5 BLUNT FILL (NEEDLE) ×1
PAD ARMBOARD 7.5X6 YLW CONV (MISCELLANEOUS) ×1 IMPLANT
SYR TB 1ML LL NO SAFETY (SYRINGE) ×1 IMPLANT
TAPE PAPER 2X10 WHT MICROPORE (GAUZE/BANDAGES/DRESSINGS) IMPLANT
WATER STERILE IRR 250ML POUR (IV SOLUTION) ×1 IMPLANT

## 2023-05-09 NOTE — Discharge Instructions (Addendum)
 Please discharge patient when stable, will follow up today with Dr. June Leap at the [facility] office immediately following discharge.  Leave shield in place until visit.  All paperwork with discharge instructions will be given at the office.  [facility] Address:  [address]  [place], [place] [address]

## 2023-05-09 NOTE — Op Note (Signed)
 Date of procedure: 05/09/23  Pre-operative diagnosis: Visually significant age-related combined cataract, Left Eye (H25.812)  Post-operative diagnosis: Visually significant age-related combined cataract, Left Eye (H25.812)  Procedure: Removal of cataract via phacoemulsification and insertion of intra-ocular lens Vicci and Johnson DIB00 +20.5D into the capsular bag of the Left Eye  Attending surgeon: Lynwood LABOR. Nahum Sherrer, MD, MA  Anesthesia: MAC, Topical Akten   Complications: None  Estimated Blood Loss: <80mL (minimal)  Specimens: None  Implants: As above  Indications:  Visually significant age-related cataract, Left Eye  Procedure:  The patient was seen and identified in the pre-operative area. The operative eye was identified and dilated.  The operative eye was marked.  Topical anesthesia was administered to the operative eye.     The patient was then to the operative suite and placed in the supine position.  A timeout was performed confirming the patient, procedure to be performed, and all other relevant information.   The patient's face was prepped and draped in the usual fashion for intra-ocular surgery.  A lid speculum was placed into the operative eye and the surgical microscope moved into place and focused.  An inferotemporal paracentesis was created using a 20 gauge paracentesis blade.  Shugarcaine was injected into the anterior chamber.  Viscoelastic was injected into the anterior chamber.  A temporal clear-corneal main wound incision was created using a 2.55mm microkeratome.  A continuous curvilinear capsulorrhexis was initiated using an irrigating cystitome and completed using capsulorrhexis forceps.  Hydrodissection and hydrodeliniation were performed.  Viscoelastic was injected into the anterior chamber.  A phacoemulsification handpiece and a chopper as a second instrument were used to remove the nucleus and epinucleus. The irrigation/aspiration handpiece was used to remove any  remaining cortical material.   The capsular bag was reinflated with viscoelastic, checked, and found to be intact.  The intraocular lens was inserted into the capsular bag.  The irrigation/aspiration handpiece was used to remove any remaining viscoelastic.  The clear corneal wound and paracentesis wounds were then hydrated and checked with Weck-Cels to be watertight. 0.1mL of Moxfloxacin was injected into the anterior chamber. The lid-speculum was removed.  The drape was removed.  The patient's face was cleaned with a wet and dry 4x4.    A clear shield was taped over the eye. The patient was taken to the post-operative care unit in good condition, having tolerated the procedure well.  Post-Op Instructions: The patient will follow up at [facility] for a same day post-operative evaluation and will receive all other orders and instructions.

## 2023-05-09 NOTE — Interval H&P Note (Signed)
 History and Physical Interval Note:  05/09/2023 11:07 AM  Latoya Bautista  has presented today for surgery, with the diagnosis of combined forms age related cataract, left eye.  The various methods of treatment have been discussed with the patient and family. After consideration of risks, benefits and other options for treatment, the patient has consented to  Procedure(s): CATARACT EXTRACTION PHACO AND INTRAOCULAR LENS PLACEMENT (IOC) (Left) as a surgical intervention.  The patient's history has been reviewed, patient examined, no change in status, stable for surgery.  I have reviewed the patient's chart and labs.  Questions were answered to the patient's satisfaction.     HARRIE AGENT

## 2023-05-09 NOTE — Transfer of Care (Signed)
 Immediate Anesthesia Transfer of Care Note  Patient: Latoya Bautista  Procedure(s) Performed: CATARACT EXTRACTION PHACO AND INTRAOCULAR LENS PLACEMENT (IOC) (Left: Eye)  Patient Location: Short Stay  Anesthesia Type:MAC  Level of Consciousness: awake, alert , and oriented  Airway & Oxygen Therapy: Patient Spontanous Breathing  Post-op Assessment: Report given to RN and Post -op Vital signs reviewed and stable  Post vital signs: Reviewed and stable  Last Vitals:  Vitals Value Taken Time  BP    Temp    Pulse    Resp    SpO2      Last Pain:  Vitals:   05/09/23 1035  TempSrc: Oral  PainSc: 0-No pain         Complications: No notable events documented.

## 2023-05-09 NOTE — Anesthesia Procedure Notes (Signed)
 Procedure Name: MAC Date/Time: 05/09/2023 11:12 AM  Performed by: Eliodoro Deward FALCON, CRNAPre-anesthesia Checklist: Patient identified, Emergency Drugs available, Suction available and Patient being monitored Patient Re-evaluated:Patient Re-evaluated prior to induction Oxygen Delivery Method: Nasal cannula Placement Confirmation: positive ETCO2

## 2023-05-09 NOTE — Anesthesia Preprocedure Evaluation (Signed)
 Anesthesia Evaluation  Patient identified by MRN, date of birth, ID band Patient awake    Reviewed: Allergy & Precautions, H&P , NPO status , Patient's Chart, lab work & pertinent test results, reviewed documented beta blocker date and time   Airway Mallampati: II  TM Distance: >3 FB Neck ROM: full    Dental no notable dental hx.    Pulmonary neg pulmonary ROS   Pulmonary exam normal breath sounds clear to auscultation       Cardiovascular Exercise Tolerance: Good hypertension, + CAD and + DOE  negative cardio ROS + Valvular Problems/Murmurs  Rhythm:regular Rate:Normal     Neuro/Psych Seizures -,  negative neurological ROS  negative psych ROS   GI/Hepatic negative GI ROS, Neg liver ROS,GERD  ,,(+) Hepatitis -  Endo/Other  negative endocrine ROSHypothyroidism    Renal/GU Renal diseasenegative Renal ROS  negative genitourinary   Musculoskeletal   Abdominal   Peds  Hematology negative hematology ROS (+) Blood dyscrasia, anemia   Anesthesia Other Findings   Reproductive/Obstetrics negative OB ROS                             Anesthesia Physical Anesthesia Plan  ASA: 3  Anesthesia Plan: MAC   Post-op Pain Management:    Induction:   PONV Risk Score and Plan:   Airway Management Planned:   Additional Equipment:   Intra-op Plan:   Post-operative Plan:   Informed Consent: I have reviewed the patients History and Physical, chart, labs and discussed the procedure including the risks, benefits and alternatives for the proposed anesthesia with the patient or authorized representative who has indicated his/her understanding and acceptance.     Dental Advisory Given  Plan Discussed with: CRNA  Anesthesia Plan Comments:        Anesthesia Quick Evaluation

## 2023-05-12 ENCOUNTER — Encounter (HOSPITAL_COMMUNITY): Payer: Self-pay | Admitting: Ophthalmology

## 2023-05-13 ENCOUNTER — Other Ambulatory Visit: Payer: Self-pay | Admitting: Hematology

## 2023-05-13 DIAGNOSIS — K7460 Unspecified cirrhosis of liver: Secondary | ICD-10-CM

## 2023-05-13 DIAGNOSIS — D696 Thrombocytopenia, unspecified: Secondary | ICD-10-CM

## 2023-05-16 NOTE — Anesthesia Postprocedure Evaluation (Signed)
 Anesthesia Post Note  Patient: Latoya Bautista  Procedure(s) Performed: CATARACT EXTRACTION PHACO AND INTRAOCULAR LENS PLACEMENT (IOC) (Left: Eye)  Patient location during evaluation: Phase II Anesthesia Type: MAC Level of consciousness: awake Pain management: pain level controlled Vital Signs Assessment: post-procedure vital signs reviewed and stable Respiratory status: spontaneous breathing and respiratory function stable Cardiovascular status: blood pressure returned to baseline and stable Postop Assessment: no headache and no apparent nausea or vomiting Anesthetic complications: no Comments: Late entry   No notable events documented.   Last Vitals:  Vitals:   05/09/23 1035 05/09/23 1133  BP: (!) 127/50 115/62  Pulse: (!) 54 62  Resp: 10 18  Temp: 36.4 C 36.5 C  SpO2: 100% 100%    Last Pain:  Vitals:   05/12/23 0937  TempSrc:   PainSc: 0-No pain                 [name]

## 2023-06-10 ENCOUNTER — Other Ambulatory Visit: Payer: Self-pay | Admitting: Hematology

## 2023-06-10 DIAGNOSIS — D696 Thrombocytopenia, unspecified: Secondary | ICD-10-CM

## 2023-06-10 DIAGNOSIS — K7460 Unspecified cirrhosis of liver: Secondary | ICD-10-CM

## 2023-07-01 ENCOUNTER — Other Ambulatory Visit: Payer: Self-pay | Admitting: Hematology

## 2023-07-01 DIAGNOSIS — K7460 Unspecified cirrhosis of liver: Secondary | ICD-10-CM

## 2023-07-01 DIAGNOSIS — D696 Thrombocytopenia, unspecified: Secondary | ICD-10-CM

## 2023-07-02 ENCOUNTER — Other Ambulatory Visit: Payer: Self-pay | Admitting: Obstetrics & Gynecology

## 2023-07-07 IMAGING — CT CT CHEST HIGH RESOLUTION
3 of 5 series · 14 of 36 positions shown, 15 images · non-contrast
Comparison: No priors.

CLINICAL DATA: 50-year-old female with history of dyspnea on
exertion for the past 6 months.

EXAM:
CT CHEST WITHOUT CONTRAST
TECHNIQUE: Multidetector CT imaging of the chest was performed following the
standard protocol without intravenous contrast. High resolution
imaging of the lungs, as well as inspiratory and expiratory imaging,
was performed.

[Series 5: standard chest · axial · 0.72mm/px · z∈[-311,-111]mm · 8 of 124 slices shown]
[im 12/124  mediastinal]
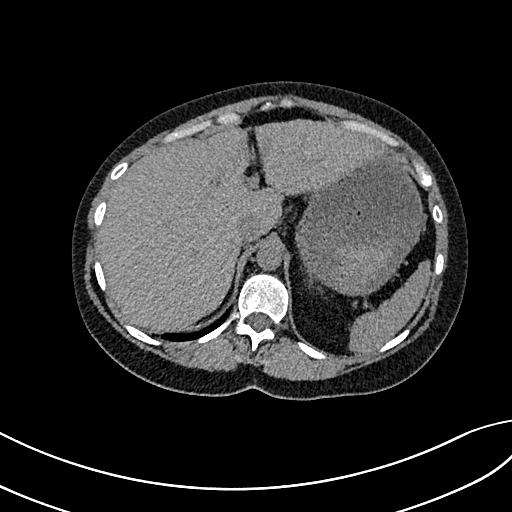
[im 24/124  mediastinal]
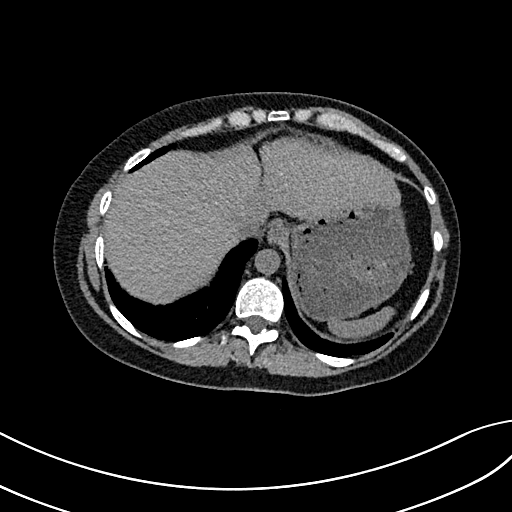
[im 42/124  mediastinal]
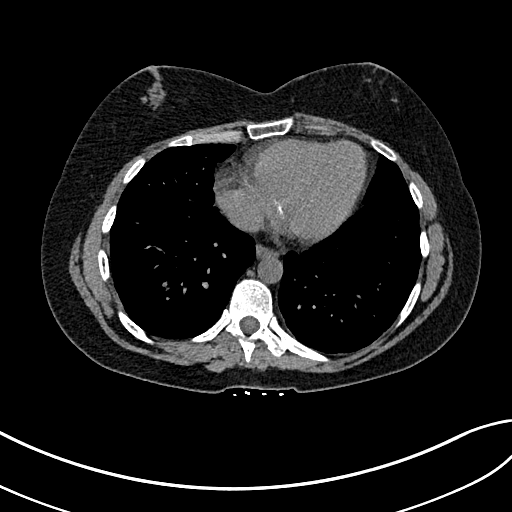
[im 53/124  mediastinal]
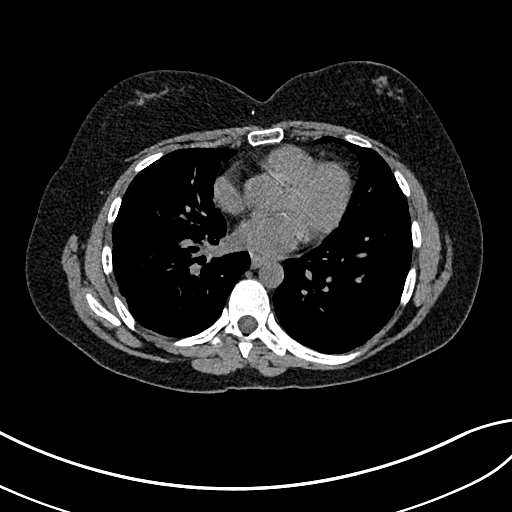
[im 71/124  mediastinal]
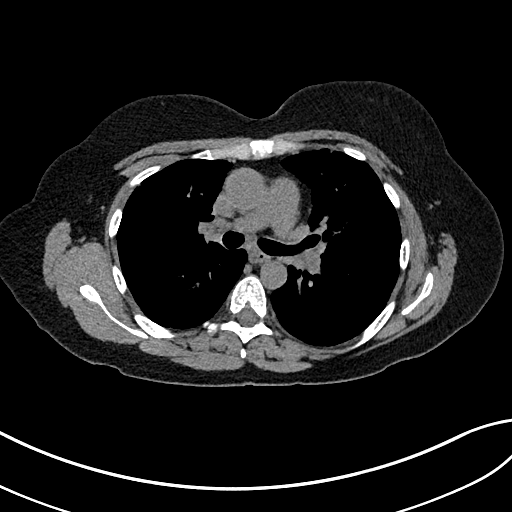
[im 83/124  mediastinal]
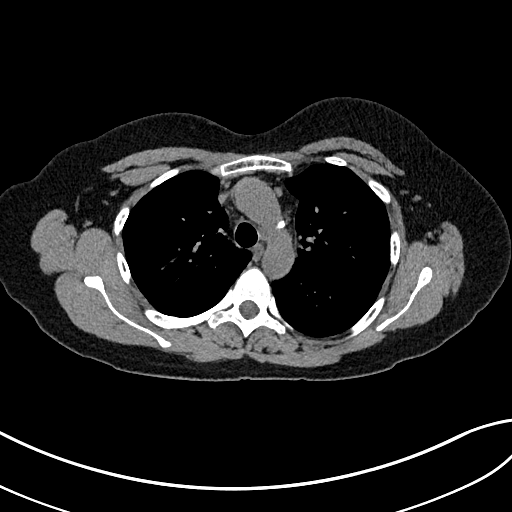
[im 100/124  mediastinal]
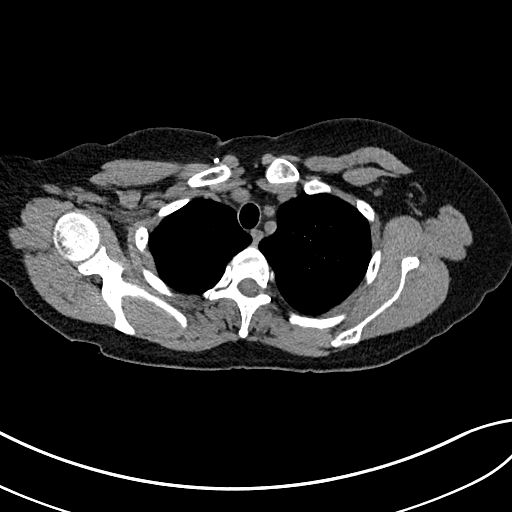
[im 112/124  mediastinal]
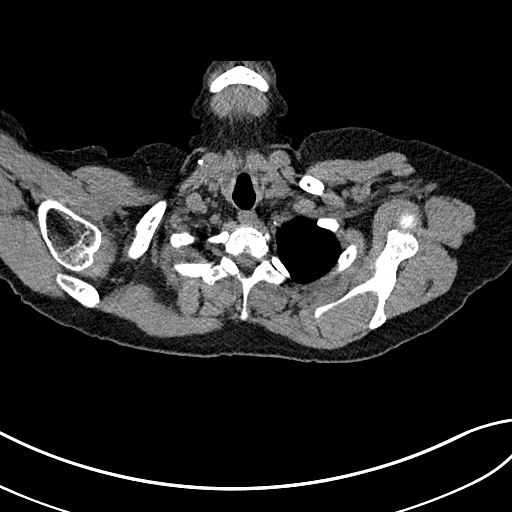

[Series 9: coronal · coronal · 0.50mm/px · 3 of 118 slices shown]
[im 24/118  lung]
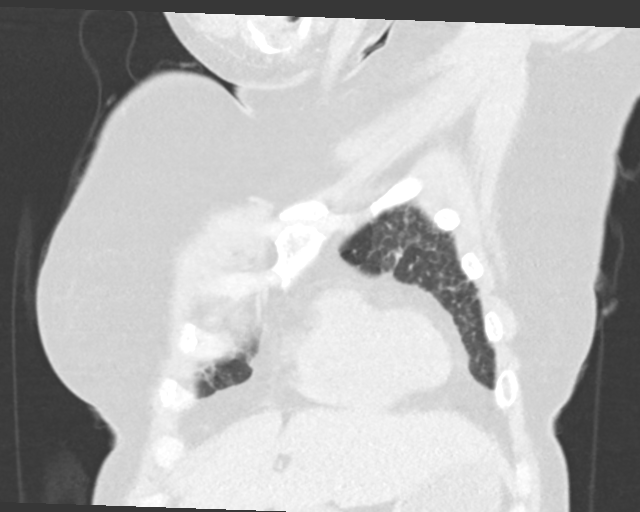
[im 47/118  lung]
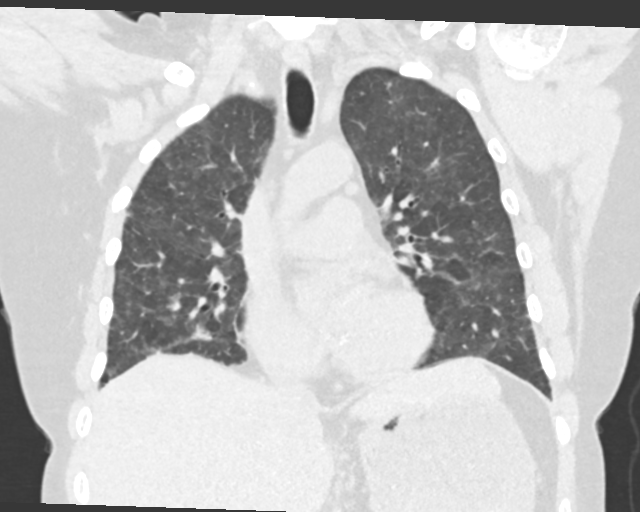
[im 71/118  lung]
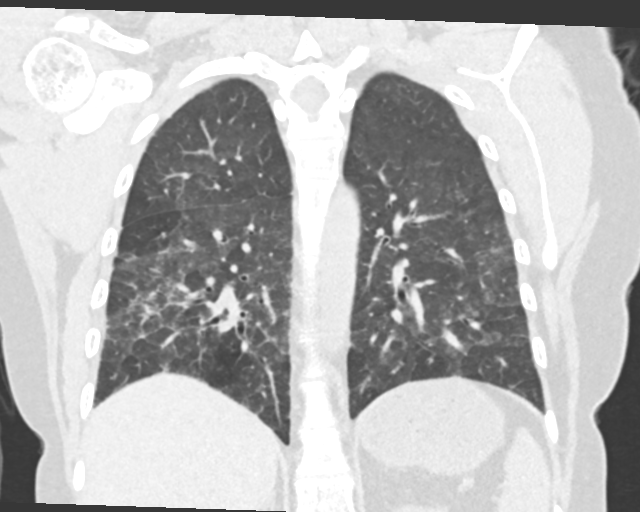

[Series 11: high res insp · axial · 0.70mm/px · z∈[-286,-104]mm · 3 of 14 slices shown, 4 images]
[im 1/14  mediastinal]
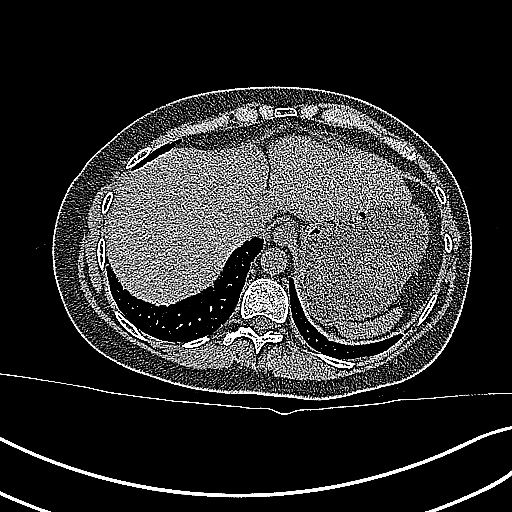
[im 1/14  lung]
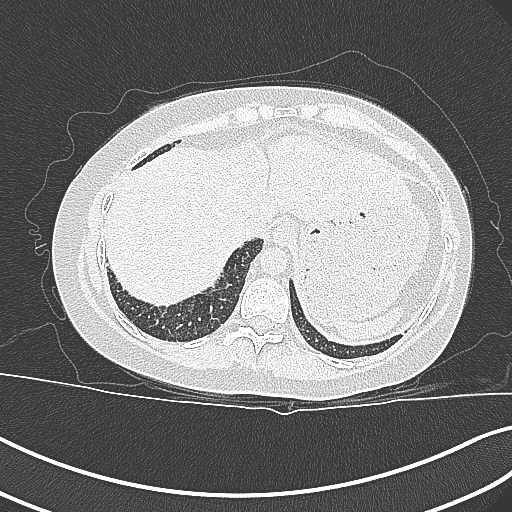
[im 7/14  lung]
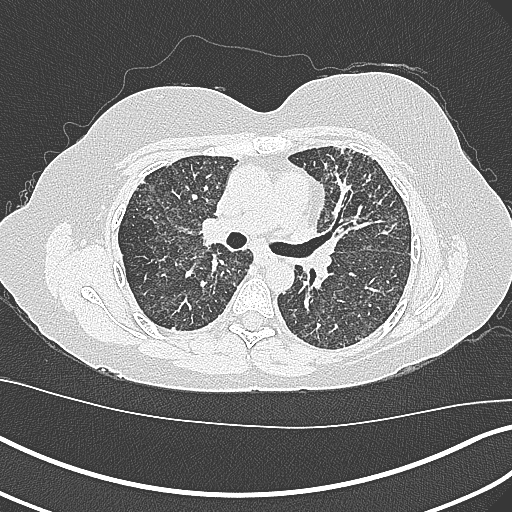
[im 14/14  lung]
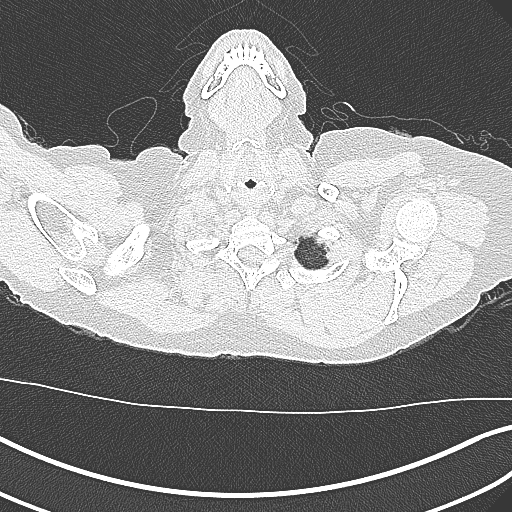

[14 of 36 positions shown; findings below may reference images not displayed]

FINDINGS: Cardiovascular: Heart size is normal. There is no significant
pericardial fluid, thickening or pericardial calcification. There is
aortic atherosclerosis, as well as atherosclerosis of the great
vessels of the mediastinum and the coronary arteries, including
calcified atherosclerotic plaque in the left main, left anterior
descending and right coronary arteries.

Mediastinum/Nodes: No pathologically enlarged mediastinal or hilar
lymph nodes. Please note that accurate exclusion of hilar adenopathy
is limited on noncontrast CT scans. Esophagus is unremarkable in
appearance. No axillary lymphadenopathy.

Lungs/Pleura: High-resolution images demonstrate widespread areas of
ground-glass attenuation and septal thickening scattered throughout
the lungs bilaterally. No scratch at minimal subpleural
reticulation. No definitive craniocaudal gradient. No traction
bronchiectasis or frank honeycombing. Inspiratory and expiratory
imaging demonstrates some mild air trapping indicative of mild small
airways disease. No acute consolidative airspace disease. No pleural
effusions. No definite suspicious appearing pulmonary nodules or
masses are noted.

Upper Abdomen: Unremarkable.

Musculoskeletal: There are no aggressive appearing lytic or blastic
lesions noted in the visualized portions of the skeleton.
IMPRESSION: 1. The appearance of the lungs is compatible with interstitial lung
disease, with a spectrum of findings considered most compatible with
an alternative diagnosis (not usual interstitial pneumonia) per
current ATS guidelines. Findings are favored to reflect nonspecific
interstitial pneumonia (NSIP), or potentially chronic
hypersensitivity pneumonitis. High-resolution chest CT is
recommended in 12 months to assess for temporal changes in the
appearance of the lung parenchyma.
2. Aortic atherosclerosis, in addition to left main and 2 vessel
coronary artery disease. Please note that although the presence of
coronary artery calcium documents the presence of coronary artery
disease, the severity of this disease and any potential stenosis
cannot be assessed on this non-gated CT examination. Assessment for
potential risk factor modification, dietary therapy or pharmacologic
therapy may be warranted, if clinically indicated.

Aortic Atherosclerosis ([id number]-DO7.7).

## 2023-07-09 ENCOUNTER — Telehealth: Payer: Self-pay | Admitting: Pulmonary Disease

## 2023-07-09 NOTE — Telephone Encounter (Signed)
 PT wonders if we can sched CT earlier. Please call to advise. Her # is [phone]

## 2023-07-09 NOTE — Telephone Encounter (Signed)
 Pt is aware of appt. Was able to reschdule for morning time same day.

## 2023-07-09 NOTE — Telephone Encounter (Signed)
 Christian - please reschedule this.

## 2023-07-24 ENCOUNTER — Other Ambulatory Visit: Payer: Self-pay | Admitting: Hematology

## 2023-07-24 ENCOUNTER — Encounter: Payer: Self-pay | Admitting: Obstetrics & Gynecology

## 2023-07-24 DIAGNOSIS — D696 Thrombocytopenia, unspecified: Secondary | ICD-10-CM

## 2023-07-24 DIAGNOSIS — K7460 Unspecified cirrhosis of liver: Secondary | ICD-10-CM

## 2023-07-24 MED ORDER — ESTRADIOL 1 MG PO TABS: 1.0000 mg | ORAL_TABLET | Freq: Every day | ORAL | 11 refills | Status: DC

## 2023-07-31 ENCOUNTER — Other Ambulatory Visit (HOSPITAL_COMMUNITY): Payer: Self-pay

## 2023-08-04 ENCOUNTER — Ambulatory Visit

## 2023-08-04 ENCOUNTER — Encounter: Payer: Self-pay | Admitting: Podiatry

## 2023-08-04 ENCOUNTER — Ambulatory Visit (INDEPENDENT_AMBULATORY_CARE_PROVIDER_SITE_OTHER): Admitting: Podiatry

## 2023-08-04 DIAGNOSIS — M79671 Pain in right foot: Secondary | ICD-10-CM

## 2023-08-04 DIAGNOSIS — M7751 Other enthesopathy of right foot: Secondary | ICD-10-CM

## 2023-08-04 DIAGNOSIS — M7750 Other enthesopathy of unspecified foot: Secondary | ICD-10-CM

## 2023-08-04 NOTE — Progress Notes (Signed)
  Subjective:  Patient ID: Latoya Bautista, female    DOB: 1970-09-19,  MRN: [id number]  Chief Complaint  Patient presents with   Foot Pain    RM#13 Right foot pain patient states has had stress fracture and is having concerns with foot again pain on top of foot when walking.    Discussed the use of AI scribe software for clinical note transcription with the patient, who gave verbal consent to proceed.  History of Present Illness The patient presents with right foot pain, reminiscent of a previous stress fracture. The pain began on Tuesday and was severe, but has since improved. She denies any recent injury, swelling, or changes in shoes or activity level. She reports using compression socks frequently and staying active by walking two miles in the gym, using a seated elliptical, and swimming. She noticed some pain when using different shoes in the pool, but it resolved when she switched to her usual pair. She also reports occasional pain in the bottom of the foot and on the side.      Objective:    Physical Exam  General: AAO x3, NAD  Dermatological: Skin is warm, dry and supple bilateral.  There are no open sores, no preulcerative lesions, no rash or signs of infection present.  Vascular: Dorsalis Pedis artery and Posterior Tibial artery pedal pulses are 2/4 bilateral with immedate capillary fill time. There is no pain with calf compression, swelling, warmth, erythema.   Neruologic: Grossly intact via light touch bilateral.   Musculoskeletal: There is no area pinpoint tenderness identified but she has some diffuse discomfort in the midfoot but she states is not nearly as bad as what it was last week.  There is no edema.  No erythema.  Flexor, extensor tendons appear to be intact.  MMT 5/5.  Gait: Unassisted, Nonantalgic.     No images are attached to the encounter.    Results RADIOLOGY Foot X-ray: No signs of stress fracture, wires from previous bunion surgery present  (08/04/2023)   Assessment:   1. Tendonitis of ankle or foot      Plan:  Patient was evaluated and treated and all questions answered.  Assessment and Plan Assessment & Plan Possible stress fracture of the right foot, possible tendinitis  - Advise wearing a boot or immobilizing shoe for 1-2 weeks to allow rest and healing.  She has this at home. - Recommend icing the foot to reduce inflammation - Suggest using acetaminophen for pain management - Advise using diclofenac gel for inflammation - Recommend avoiding high-impact activities and limiting walking - Allow low-impact activities like using a seated elliptical - Schedule follow-up in 2-3 weeks if symptoms persist - Discuss the importance of supportive footwear with stiff soles and good arch support  Return for stress fracture 2-3 weeks right foot.  Repeat x-rays if symptoms continue.  Vivi Barrack DPM        Return for stress fracture 2-3 weeks right foot.

## 2023-08-06 ENCOUNTER — Other Ambulatory Visit (HOSPITAL_COMMUNITY): Payer: Self-pay | Admitting: Obstetrics & Gynecology

## 2023-08-06 DIAGNOSIS — Z1231 Encounter for screening mammogram for malignant neoplasm of breast: Secondary | ICD-10-CM

## 2023-08-21 ENCOUNTER — Encounter (HOSPITAL_COMMUNITY): Payer: Self-pay

## 2023-08-21 ENCOUNTER — Ambulatory Visit: Admitting: Podiatry

## 2023-08-21 ENCOUNTER — Ambulatory Visit (INDEPENDENT_AMBULATORY_CARE_PROVIDER_SITE_OTHER)

## 2023-08-21 ENCOUNTER — Ambulatory Visit (HOSPITAL_COMMUNITY)
Admission: RE | Admit: 2023-08-21 | Discharge: 2023-08-21 | Disposition: A | Discharge status: Home / Self Care | Source: Ambulatory Visit | Attending: Obstetrics & Gynecology | Admitting: Obstetrics & Gynecology

## 2023-08-21 DIAGNOSIS — Z1231 Encounter for screening mammogram for malignant neoplasm of breast: Secondary | ICD-10-CM | POA: Insufficient documentation

## 2023-08-21 DIAGNOSIS — M7751 Other enthesopathy of right foot: Secondary | ICD-10-CM | POA: Diagnosis not present

## 2023-08-21 DIAGNOSIS — M7750 Other enthesopathy of unspecified foot: Secondary | ICD-10-CM

## 2023-08-22 NOTE — Progress Notes (Signed)
  Subjective:  Patient ID: Latoya Bautista, female    DOB: 05/27/1970,  MRN: [id number]  Chief Complaint  Patient presents with   Foot Injury    RM#11 Follow up on right foot stress fracture patient states doing well no pain.    Discussed the use of AI scribe software for clinical note transcription with the patient, who gave verbal consent to proceed.  History of Present Illness The patient, with a history of bunion surgery, presents with intermittent right foot pain. The pain is described as 'trying to hurt' and is located on the top of the foot, towards the outer side. The pain is not constant and does not occur every day, but it can be severe enough to cause limping and difficulty bending the foot.  Overall she states that she is feeling better.  She is wearing regular shoes today.  Overall she states that she is feeling better.  She is wearing regular shoes today.  She does wear compression socks and she has been doing exercises to help.  She has not seen any swelling or bruising.    The patient also mentions a rod in her leg, which can cause discomfort in the knee and hip if she does not exercise regularly. She has been using an elliptical machine for exercise, which seems to help with this discomfort. The patient also discusses her footwear, noting that she has several pairs of supportive shoes ([address] [place], [place], [place]) that she rotates regularly. She has found that wearing the right shoe and good support is crucial for her foot health.      Objective:    Physical Exam General: AAO x3, NAD  Dermatological: Skin is warm, dry and supple bilateral.  There are no open sores, no preulcerative lesions, no rash or signs of infection present.  Vascular: Dorsalis Pedis artery and Posterior Tibial artery pedal pulses are 2/4 bilateral with immedate capillary fill time.  There is no pain with calf compression, swelling, warmth, erythema.   Neruologic: Grossly intact via light touch  bilateral.   Musculoskeletal: Unable to appreciate the area of pinpoint tenderness.  There is no edema, erythema.  Flexor, extensor tendons appear to be intact.  MMT 5/5.  Gait: Unassisted, Nonantalgic.     No images are attached to the encounter.    Results RADIOLOGY Foot X-ray: No signs of fracture; presence of hardware from previous bunion surgery (08/21/2023)   Assessment:   1. Tendonitis of ankle or foot      Plan:  Patient was evaluated and treated and all questions answered.  Assessment and Plan Assessment & Plan Post-bunionectomy foot pain Intermittent right foot pain likely due to post-surgical changes and stress on foot structure. X-rays show intact wires from previous surgery. Supportive footwear is crucial. - Continue wearing compression socks. - Continue daily exercises to strengthen foot and ankle muscles. - Use ice and diclofenac  gel for pain management as needed. - Consider corticosteroid injection if pain becomes severe (a no signs of fracture/stress fracture) - Maintain wearing supportive shoes like [place], [place], [place], and [address].  Stress fractures History of stress fractures. Early intervention prevents symptom exacerbation. - Monitor for signs of stress fractures and adjust activity levels accordingly. - Use supportive footwear to prevent stress fractures.    Return if symptoms worsen or fail to improve.    Charity Conch DPM

## 2023-08-26 ENCOUNTER — Other Ambulatory Visit: Payer: Self-pay | Admitting: Hematology

## 2023-08-26 ENCOUNTER — Other Ambulatory Visit (HOSPITAL_COMMUNITY): Payer: Self-pay | Admitting: Obstetrics & Gynecology

## 2023-08-26 DIAGNOSIS — K7460 Unspecified cirrhosis of liver: Secondary | ICD-10-CM

## 2023-08-26 DIAGNOSIS — R928 Other abnormal and inconclusive findings on diagnostic imaging of breast: Secondary | ICD-10-CM

## 2023-08-26 DIAGNOSIS — D696 Thrombocytopenia, unspecified: Secondary | ICD-10-CM

## 2023-09-11 ENCOUNTER — Ambulatory Visit (HOSPITAL_COMMUNITY)
Admission: RE | Admit: 2023-09-11 | Discharge: 2023-09-11 | Disposition: A | Discharge status: Home / Self Care | Source: Ambulatory Visit | Attending: Obstetrics & Gynecology | Admitting: Obstetrics & Gynecology

## 2023-09-11 ENCOUNTER — Encounter (HOSPITAL_COMMUNITY): Payer: Self-pay

## 2023-09-11 DIAGNOSIS — R928 Other abnormal and inconclusive findings on diagnostic imaging of breast: Secondary | ICD-10-CM

## 2023-09-12 ENCOUNTER — Other Ambulatory Visit (HOSPITAL_COMMUNITY): Payer: Self-pay | Admitting: Obstetrics & Gynecology

## 2023-09-12 DIAGNOSIS — N63 Unspecified lump in unspecified breast: Secondary | ICD-10-CM

## 2023-09-15 ENCOUNTER — Encounter (HOSPITAL_COMMUNITY): Payer: Self-pay

## 2023-09-22 ENCOUNTER — Other Ambulatory Visit (HOSPITAL_COMMUNITY): Payer: Self-pay | Admitting: Diagnostic Radiology

## 2023-09-22 ENCOUNTER — Ambulatory Visit
Admission: RE | Admit: 2023-09-22 | Discharge: 2023-09-22 | Disposition: A | Discharge status: Home / Self Care | Source: Ambulatory Visit | Attending: Obstetrics & Gynecology | Admitting: Obstetrics & Gynecology

## 2023-09-22 DIAGNOSIS — N63 Unspecified lump in unspecified breast: Secondary | ICD-10-CM

## 2023-09-22 HISTORY — PX: BREAST BIOPSY: SHX20

## 2023-09-23 LAB — SURGICAL PATHOLOGY

## 2023-09-30 ENCOUNTER — Encounter: Payer: Self-pay | Admitting: Obstetrics & Gynecology

## 2023-10-02 ENCOUNTER — Encounter: Payer: Self-pay | Admitting: Surgery

## 2023-10-02 ENCOUNTER — Ambulatory Visit (INDEPENDENT_AMBULATORY_CARE_PROVIDER_SITE_OTHER): Admitting: Surgery

## 2023-10-02 ENCOUNTER — Telehealth: Payer: Self-pay | Admitting: *Deleted

## 2023-10-02 ENCOUNTER — Other Ambulatory Visit (HOSPITAL_COMMUNITY): Payer: Self-pay | Admitting: Surgery

## 2023-10-02 VITALS — BP 134/76 | HR 71 | Temp 98.1°F | Resp 14 | Ht 59.0 in | Wt 135.0 lb

## 2023-10-02 DIAGNOSIS — N6489 Other specified disorders of breast: Secondary | ICD-10-CM | POA: Diagnosis not present

## 2023-10-02 DIAGNOSIS — R928 Other abnormal and inconclusive findings on diagnostic imaging of breast: Secondary | ICD-10-CM

## 2023-10-02 NOTE — Telephone Encounter (Signed)
  Pulmonologist: Phyllis Breeze, MD Last OV:   04/15/2023 Next OV:  10/30/2023  [facility] Pre-Operative Risk Assessment Pulmonology Risk Stratification   General Surgeon: Bynum Cassis Pappayliou, DO  Type of surgery being performed: EXCISIONAL BREAST BIOPSY (LUMPECTOMY) W/ RADIOFREQUENCY LOCALIZER, LEFT  Anesthesia Type: General  Scheduled Date: 11/06/2023  Type of Clearance Required:  Pulmonology Risk Stratification~ ILD (interstitial lung disease)  **CONFIDENTIALITY NOTICE** This message is intended only for the use of the individual or entity to which it is addressed, and may contain information that is privileged, confidential, and exempt from disclosure under applicable law. If the reader of this message is not the intended recipient, you are hereby notified that any dissemination, distribution, or copying of this communication is strictly prohibited. If you have received this communication in error, please notify our Compliance & Privacy Helpline at [phone].

## 2023-10-06 ENCOUNTER — Telehealth: Payer: Self-pay | Admitting: *Deleted

## 2023-10-06 DIAGNOSIS — N6489 Other specified disorders of breast: Secondary | ICD-10-CM

## 2023-10-06 NOTE — Progress Notes (Signed)
 [facility] History and Physical  Reason for Referral: Abnormal mammogram Referring Physician: Breast center  Chief Complaint   New Patient (Initial Visit)     Latoya Bautista is a 53 y.o. female.  HPI: Patient presents for evaluation of an abnormal left breast finding on mammogram.  She has been undergoing screening mammograms for multiple years, and this was her first abnormal mammogram. The patient has no history of any masses, lumps, bumps, nipple changes or discharge. She never menstruated, secondary to damage from total body radiation when she was a child for leukemia.  She has no children and has no ability to become pregnant.  She has no history of any family breast cancer.  She is currently taking estrogen hormone replacement.  She has never had any previous biopsies or concerning areas on mammogram.  She has a history of leukemia diagnosed when she was 53 years old and recurred when she was 53 years old.  She underwent 2 bone marrow transplants and received total body radiation.  She also has a personal history of melanoma of her labia which was excised, left leg skin cancer which was excised, and squamous cell carcinoma of the tongue status post partial glossectomy 3 years ago.  Her other medical history significant procedures, GERD, hypertension, hyperlipidemia, pulmonary fibrosis for which she follows with pulmonology, and cirrhosis of the liver.  Her surgical history is significant for laparoscopic cholecystectomy in addition to the above listed surgeries.  She denies use of blood thinning medications.  She denies use of tobacco products, alcohol, and illicit drugs.   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   dx age 64;   chemo;  whole body radiation;  x2 stem cell transplants @ [facility] 1982 & 1984   History of antineoplastic chemotherapy    child   History of basal cell carcinoma (BCC) excision 10/2016   left leg   History of hepatitis C 2012   pt contracted hep c from blood transfuions's as child in setting of ALL;   08/ 2012 liver bx advanced liver disease mild portal hyertensive gastropathy G2S3, completed 44 wks telaprevir trple bases therapy 12/ 2012 ;  cured and  had  egd 2016 no evidence [facility]   History of kidney stones 2011   History of melanoma in situ 07/2018   vulva bx   History of radiation therapy    whole body radiation in 1980s for ALL   Hyperlipidemia, mixed    Hypersensitivity pneumonitis (HCC)    Hypertension    Hypothyroidism    endocrinologist-- dr Montell Ao   ILD (interstitial lung disease) [facility])    pulmonologist-- dr Rosezella Conte   Intermittent palpitations    cardiologist--- dr j. branch;   monitor 03/ 2019  PACs/ PVCs   Osteoporosis    Primary amenorrhea    Seasonal allergies    Seizure disorder [facility]) 1982   neurologist--- dr a.  wabulya;  since age 80 with first stem cell transplant in setting ALL   Squamous cell carcinoma of lateral tongue [facility]) 2021   oncologist--- dr Cheree Cords  ENT-- Dr Grace Laura;    recurrent dysplasia bx's since 2005 until 07/ 2021 small resection and larger excisional bx 07/ 2022 with positive margins;   01-18-2021 right partial glossectomy (less than half)   Thrombocytopenia [facility])     Past Surgical History:  Procedure Laterality Date   BONE MARROW BIOPSY  05/07/2011   BONE MARROW TRANSPLANT     BREAST BIOPSY Left 09/22/2023   MM LT BREAST BX W LOC DEV 1ST LESION IMAGE BX SPEC STEREO GUIDE 09/22/2023 [facility]    BUNIONECTOMY     CATARACT EXTRACTION W/PHACO Right 04/25/2023   Procedure: CATARACT EXTRACTION PHACO AND INTRAOCULAR LENS PLACEMENT (IOC);  Surgeon: Tarri Farm, MD;  Location: [facility];  Service: Ophthalmology;  Laterality: Right;  CDE 1.97   CATARACT EXTRACTION W/PHACO Left 05/09/2023   Procedure: CATARACT EXTRACTION PHACO AND INTRAOCULAR LENS PLACEMENT (IOC);  Surgeon: Tarri Farm, MD;  Location: [facility];  Service: Ophthalmology;  Laterality: Left;  CDE 3.26   CHOLECYSTECTOMY, LAPAROSCOPIC  1994   ESOPHAGOGASTRODUODENOSCOPY  09/03/2016   EXOSTECTECTOMY TOE Right 09/04/2022   Procedure: EXOSTECTECTOMY TOE;  Surgeon: Charity Conch, DPM;  Location: [facility];  Service: Podiatry;  Laterality: Right;   FEMUR IM NAIL Right 05/06/2022   Procedure: INTRAMEDULLARY (IM) NAIL FEMORAL;  Surgeon: Laneta Pintos, MD;  Location: [facility];  Service: Orthopedics;  Laterality: Right;   MOHS SURGERY  2023   spring   PARTIAL GLOSSECTOMY  01/18/2021   @ [facility];  less than half , right lateral    Family History  Problem Relation Age of Onset   Hypertension Father    Diabetes Maternal Uncle     Social History   Tobacco Use   Smoking status: Never    Passive exposure: Never   Smokeless tobacco: Never  Vaping Use   Vaping status: Never Used  Substance Use Topics   Alcohol use: No   Drug use: Never    Medications: I have reviewed the patient's current medications. Allergies as of 10/02/2023       Reactions   Simvastatin Rash   Briviact [brivaracetam]    Elevated LFT's   Asparaginase Derivatives Rash   Elspar [asparaginase] Rash   Keppra  [levetiracetam ] Rash   Soap Rash, Other (See Comments)   Procedure prep-soap (not Betadine , either- per the patient)   Tape Rash, Other (See Comments)   Redness from the tape        Medication List        Accurate as of Oct 02, 2023 11:59 PM. If you have any questions, ask your nurse or doctor.          CENTRUM PO Take 1  tablet by mouth daily.   cholecalciferol 1000 units tablet Commonly known as: VITAMIN D  Take 1,000 Units by mouth daily.   CoQ-10 100 MG Caps Take 1 capsule by mouth daily.   estradiol  1 MG tablet Commonly known as: Estrace  Take 1 tablet (1 mg total) by mouth daily.   famotidine  20 MG tablet Commonly known as: PEPCID  Take 1 tablet (20 mg total) by mouth 2 (two) times daily. What changed: when to take this   fenofibrate  54 MG tablet Take 54 mg by mouth daily.   folic acid  1 MG tablet Commonly known as: FOLVITE  take 1 tablet once daily.   Garlic  1000 MG Caps Take 1 capsule by mouth daily.   glucosamine-chondroitin 500-400 MG tablet Take 1 tablet by mouth 2 (two) times daily.   lamoTRIgine  50 MG 24 hour tablet Commonly known as: LAMICTAL  XR Take 3 tablets (150 mg total) by mouth 2 (two) times daily.   levothyroxine  125 MCG tablet Commonly known as: Synthroid  Take 0.5 tablets (62.5 mcg total) by mouth daily.   Lycopene 10 MG Caps Take by mouth.   MAGNESIUM PO Take 1 tablet by mouth daily.   Metoprolol  Tartrate 75 MG Tabs Take 1 tablet (75 mg total) by mouth 2 (two) times daily.   OMEGA-3 FISH OIL PO Take 1 capsule by mouth daily.   pantoprazole  40 MG tablet Commonly known as: PROTONIX  Take 1 tablet (40 mg total) by mouth daily. What changed: when to take this   Probiotic Daily Caps Take 1 capsule by mouth daily.   progesterone  100 MG capsule Commonly known as: PROMETRIUM  Take 1 capsule (100 mg total) by mouth daily for 15 days per month.   Quercetin 500 MG Caps Take 1 capsule by mouth daily.   Reclast  5 MG/100ML Soln injection Generic drug: zoledronic  acid Inject 5 mg into the vein once.   rosuvastatin  40 MG tablet Commonly known as: Crestor  Take 1 tablet (40 mg total) by mouth at bedtime.   sodium bicarbonate  325 MG tablet Take 325 mg by mouth 2 (two) times daily.   vitamin C 1000 MG tablet Take 1,000 mg by mouth daily.   Vitamin D   (Ergocalciferol ) 1.25 MG (50000 UNIT) Caps capsule Commonly known as: DRISDOL  Take 1 capsule (50,000 Units total) by mouth every OTHER week as directed What changed:  when to take this additional instructions   VITAMIN E PO Take 1 capsule by mouth daily.   Vitamin K2 100 MCG Caps Take 1 capsule by mouth daily.         ROS:  Constitutional: negative for chills, fatigue, and fevers Eyes: negative for visual disturbance and pain Ears, nose, mouth, throat, and face: positive for sinus problems, negative for ear drainage and sore throat Respiratory: positive for shortness of breath, negative for cough and wheezing Cardiovascular: negative for chest pain and palpitations Gastrointestinal: positive for reflux symptoms, negative for abdominal pain, nausea, and vomiting Genitourinary:negative for dysuria and frequency Integument/breast: positive for dryness, negative for rash Hematologic/lymphatic: negative for bleeding and lymphadenopathy Musculoskeletal:negative for back pain and neck pain Neurological: negative for dizziness and tremors Endocrine: negative for temperature intolerance  Blood pressure 134/76, pulse 71, temperature 98.1 F (36.7 C), temperature source Oral, resp. rate 14, height 4\' 11"  (1.499 m), weight 135 lb (61.2 kg), SpO2 97%. Physical Exam Vitals reviewed.  Constitutional:      Appearance: Normal appearance.  HENT:     Head: Normocephalic and atraumatic.  Eyes:     Extraocular Movements: Extraocular movements intact.     Pupils: Pupils are equal, round, and reactive to light.  Cardiovascular:     Rate and Rhythm: Normal rate and regular rhythm.  Pulmonary:     Effort: Pulmonary effort is normal.     Breath sounds: Normal breath sounds.  Chest:  Breasts:    Right: Normal. No swelling, bleeding, inverted nipple, mass, nipple discharge, skin change or tenderness.     Left: Normal. No swelling, bleeding, inverted nipple, mass, nipple discharge, skin change  or tenderness.     Comments: No palpable abnormalities in the right or left breast Abdominal:     General: There is no  distension.     Palpations: Abdomen is soft.     Tenderness: There is no abdominal tenderness.  Musculoskeletal:        General: Normal range of motion.     Cervical back: Normal range of motion.  Lymphadenopathy:     Upper Body:     Right upper body: No supraclavicular or axillary adenopathy.     Left upper body: No supraclavicular or axillary adenopathy.  Skin:    General: Skin is warm and dry.  Neurological:     General: No focal deficit present.     Mental Status: She is alert and oriented to person, place, and time.  Psychiatric:        Mood and Affect: Mood normal.        Behavior: Behavior normal.     Results: Bilateral screening mammogram (08/21/2023): IMPRESSION: Further evaluation is suggested for a possible mass in the left breast.   RECOMMENDATION: Diagnostic mammogram and possibly ultrasound of the left breast. (Code:FI-L-42M)   The patient will be contacted regarding the findings, and additional imaging will be scheduled.   BI-RADS CATEGORY  0: Incomplete: Need additional imaging evaluation.  Left breast diagnostic mammogram (09/11/2023): IMPRESSION: Indeterminate 0.5 cm mass in the central upper posterior left breast.   RECOMMENDATION: Recommend stereotactic guided core biopsy of the mass in the left breast.   I have discussed the findings and recommendations with the patient. If applicable, a reminder letter will be sent to the patient regarding the next appointment.   BI-RADS CATEGORY  4: Suspicious.  Left breast ultrasound (09/11/2023): IMPRESSION: Indeterminate 0.5 cm mass in the central upper posterior left breast.   RECOMMENDATION: Recommend stereotactic guided core biopsy of the mass in the left breast.   I have discussed the findings and recommendations with the patient. If applicable, a reminder letter will be sent to  the patient regarding the next appointment.   BI-RADS CATEGORY  4: Suspicious.  Left breast stereotactic core needle biopsy (09/22/2023): IMPRESSION: Stereotactic-guided biopsy of the left breast. No apparent complications.  ADDENDUM REPORT: 09/23/2023 20:35   ADDENDUM: Pathology revealed FOCAL RADIAL SCAR/COMPLEX SCLEROSING LESION (3 MM) WITH ASSOCIATED FIBROCYSTIC CHANGE AND APOCRINE METAPLASIA WITH BACKGROUND BREAST CONSISTING PREDOMINANTLY OF ADIPOSE TISSUE WITH ONLY RARE DUCTULAR ELEMENTS of the LEFT breast, upper central, 12 o'clock, (ribbon clip). This was found to be concordant by Dr. Dina Arceo, with excision recommended.   Pathology results were discussed with the patient by telephone. The patient reported doing well after the biopsy with tenderness at the site. Post biopsy instructions and care were reviewed and questions were answered. The patient was encouraged to call [facility] for any additional concerns. My direct phone number was provided.   Surgical consultation request was sent to [facility] in [place], [place] via secure EPIC message on Sep 23, 2023.    Assessment & Plan:  MAEDELL HEDGER is a 53 y.o. female who presents with an abnormal mammogram.  -We discussed her pathology from her stereotactic core needle biopsy demonstrating radial scar and a complex sclerosing lesion.  While these lesions are not considered precancerous, they do increase her risk of either sided breast cancer and is recommended to be surgically excised to rule out any underlying cancer. -Given this area is not palpable, she will need to undergo radiofrequency tag placement by radiology -We also discussed that pending her final pathology, she may require further surgical intervention at the excision site or by excision of lymph  nodes. -The risk and benefits of left breast lumpectomy were discussed including but not limited to bleeding,  infection, injury to surrounding structures, and need for additional procedures.  After careful consideration, Latoya Bautista has decided to proceed with surgery.  -Patient tentatively scheduled tag placement on 6/24 and for surgery on 7/3 -Information provided to the patient regarding lumpectomy  All questions were answered to the satisfaction of the patient.  Note: Portions of this report may have been transcribed using voice recognition software. Every effort has been made to ensure accuracy; however, inadvertent computerized transcription errors may still be present.   Lidia Reels, DO [facility] [address] [place], [place] [address] [phone] (office)

## 2023-10-06 NOTE — Telephone Encounter (Signed)
 Received call from patient [phone]~ telephone.   Patient reports that she has concerns about upcoming procedure and would like to discuss with provider again. Appointment scheduled.   Patient reports that she is interested in bilateral mastectomy as she has history of breast cancer. Advised that she would need to discuss with provider.

## 2023-10-06 NOTE — Telephone Encounter (Signed)
 Discussed with Dr. Cherilyn Corn.   Recommended that patient does not have previous breast cancer diagnosis and no genetic testing has been done to support total mastectomy or bilateral mastectomy. Advised that insurance may not cover either without supporting documentation.   Recommended that patient see oncology to determine if patient is a candidate for either total or bilateral mastectomy due to her history.   Call placed to patient and patient made aware. Agreeable to seeing oncology earlier than her scheduled appointment in July.

## 2023-10-06 NOTE — Addendum Note (Signed)
 Addended by: Cathalene Clipper on: 10/06/2023 01:28 PM   Modules accepted: Orders

## 2023-10-08 ENCOUNTER — Inpatient Hospital Stay: Payer: Self-pay

## 2023-10-08 ENCOUNTER — Encounter (HOSPITAL_COMMUNITY): Payer: Self-pay | Admitting: Surgery

## 2023-10-08 ENCOUNTER — Inpatient Hospital Stay: Payer: Self-pay | Admitting: Oncology

## 2023-10-08 ENCOUNTER — Telehealth: Payer: Self-pay | Admitting: *Deleted

## 2023-10-08 ENCOUNTER — Other Ambulatory Visit (HOSPITAL_COMMUNITY): Payer: Self-pay | Admitting: Surgery

## 2023-10-08 DIAGNOSIS — R928 Other abnormal and inconclusive findings on diagnostic imaging of breast: Secondary | ICD-10-CM

## 2023-10-08 NOTE — Telephone Encounter (Signed)
 Copied from CRM [id number]. Topic: General - Other >> Oct 08, 2023  9:07 AM Chantha C wrote: Reason for CRM: Craige Dixon with [facility] [phone], Fax# [phone] states sent a message to Dr. Waylan Haggard and faxed to the office  10/02/23 for medical clearance. Patient's surgery is scheduled for 11/06/23. Craige Dixon is checking to make sure the office has the forms. Please advise and call back.   I do not recall seeing this form. Routing to Ashlyn and Margie to see if they can help with this since I am not at [employer] this wk. Thanks.

## 2023-10-09 ENCOUNTER — Encounter: Payer: Self-pay | Admitting: Pulmonary Disease

## 2023-10-09 NOTE — Progress Notes (Signed)
 Peri-operative Assessment of Pulmonary Risk for Non-Thoracic Surgery:  Asked to provide pulmonary risk assessment for planned breast cancer surgery Patient is being followed in pulmonary clinic for ILD, hypersensitivity pneumonitis She previously underwent cataract surgery in December 2024, January 2025 under MAC anesthesia, right toe surgery in May 2024 under MAC anesthesia and intramedullary nailing of right femur fracture in January 2024 under general anesthesia without any pulmonary complications.  By Latoya Bautista criteria patient has   Low risk 1.6% risk of in-hospital post-op pulmonary complications (composite including respiratory failure, respiratory infection, pleural effusion, atelectasis, pneumothorax, bronchospasm, aspiration pneumonitis).  No significant pulmonary contraindications to surgery noted.  For Latoya Bautista, risk of perioperative pulmonary complications is increased by:  ILD  Respiratory complications generally occur in 1% of ASA Class I patients, 5% of ASA Class II and 10% of ASA Class III-IV patients These complications rarely result in mortality and iclude postoperative pneumonia, atelectasis, pulmonary embolism, ARDS and increased time requiring postoperative mechanical ventilation.  Overall, I recommend proceeding with the surgery if the risk for respiratory complications are outweighed by the potential benefits. This will need to be discussed between the patient and surgeon.  To reduce risks of respiratory complications, I recommend: --Pre- and post-operative incentive spirometry performed frequently while awake --Avoiding use of pancuronium during anesthesia.  Latoya Fluke MD [facility] See Amion for pager  If no response to pager , please call [phone] until 7pm After 7:00 pm call Elink  [phone] 10/09/2023, 9:57 AM

## 2023-10-09 NOTE — Telephone Encounter (Signed)
 Pulmonary assessment provided in a separate note

## 2023-10-09 NOTE — Telephone Encounter (Signed)
 Risk assessment from Dr Waylan Haggard 10/09/23 was faxed to [facility]

## 2023-10-10 ENCOUNTER — Encounter: Payer: Self-pay | Admitting: Oncology

## 2023-10-10 ENCOUNTER — Inpatient Hospital Stay: Payer: Self-pay

## 2023-10-10 ENCOUNTER — Inpatient Hospital Stay: Attending: Oncology | Admitting: Oncology

## 2023-10-10 VITALS — BP 138/65 | HR 62 | Temp 97.9°F | Resp 16 | Ht 59.0 in | Wt 134.6 lb

## 2023-10-10 DIAGNOSIS — Z85828 Personal history of other malignant neoplasm of skin: Secondary | ICD-10-CM | POA: Insufficient documentation

## 2023-10-10 DIAGNOSIS — Z9481 Bone marrow transplant status: Secondary | ICD-10-CM | POA: Insufficient documentation

## 2023-10-10 DIAGNOSIS — Z85810 Personal history of malignant neoplasm of tongue: Secondary | ICD-10-CM | POA: Insufficient documentation

## 2023-10-10 DIAGNOSIS — N6082 Other benign mammary dysplasias of left breast: Secondary | ICD-10-CM | POA: Insufficient documentation

## 2023-10-10 DIAGNOSIS — Z923 Personal history of irradiation: Secondary | ICD-10-CM | POA: Diagnosis not present

## 2023-10-10 DIAGNOSIS — E538 Deficiency of other specified B group vitamins: Secondary | ICD-10-CM | POA: Diagnosis not present

## 2023-10-10 DIAGNOSIS — Z86006 Personal history of melanoma in-situ: Secondary | ICD-10-CM | POA: Insufficient documentation

## 2023-10-10 DIAGNOSIS — Z856 Personal history of leukemia: Secondary | ICD-10-CM | POA: Diagnosis not present

## 2023-10-10 DIAGNOSIS — Z9484 Stem cells transplant status: Secondary | ICD-10-CM | POA: Insufficient documentation

## 2023-10-10 DIAGNOSIS — C021 Malignant neoplasm of border of tongue: Secondary | ICD-10-CM

## 2023-10-10 LAB — CBC WITH DIFFERENTIAL/PLATELET
Abs Immature Granulocytes: 0.07 10*3/uL (ref 0.00–0.07)
Basophils Absolute: 0.1 10*3/uL (ref 0.0–0.1)
Basophils Relative: 1 %
Eosinophils Absolute: 0.1 10*3/uL (ref 0.0–0.5)
Eosinophils Relative: 1 %
HCT: 41.8 % (ref 36.0–46.0)
Hemoglobin: 14.3 g/dL (ref 12.0–15.0)
Immature Granulocytes: 1 %
Lymphocytes Relative: 44 %
Lymphs Abs: 3.8 10*3/uL (ref 0.7–4.0)
MCH: 34.1 pg — ABNORMAL HIGH (ref 26.0–34.0)
MCHC: 34.2 g/dL (ref 30.0–36.0)
MCV: 99.8 fL (ref 80.0–100.0)
Monocytes Absolute: 0.9 10*3/uL (ref 0.1–1.0)
Monocytes Relative: 10 %
Neutro Abs: 3.7 10*3/uL (ref 1.7–7.7)
Neutrophils Relative %: 43 %
Platelets: 170 10*3/uL (ref 150–400)
RBC: 4.19 MIL/uL (ref 3.87–5.11)
RDW: 12.2 % (ref 11.5–15.5)
WBC: 8.7 10*3/uL (ref 4.0–10.5)
nRBC: 0 % (ref 0.0–0.2)

## 2023-10-10 LAB — VITAMIN B12: Vitamin B-12: 1426 pg/mL — ABNORMAL HIGH (ref 180–914)

## 2023-10-10 LAB — IRON AND TIBC
Iron: 128 ug/dL (ref 28–170)
Saturation Ratios: 30 % (ref 10.4–31.8)
TIBC: 421 ug/dL (ref 250–450)
UIBC: 293 ug/dL

## 2023-10-10 LAB — COMPREHENSIVE METABOLIC PANEL WITH GFR
ALT: 29 U/L (ref 0–44)
AST: 35 U/L (ref 15–41)
Albumin: 4.6 g/dL (ref 3.5–5.0)
Alkaline Phosphatase: 52 U/L (ref 38–126)
Anion gap: 13 (ref 5–15)
BUN: 14 mg/dL (ref 6–20)
CO2: 23 mmol/L (ref 22–32)
Calcium: 10.3 mg/dL (ref 8.9–10.3)
Chloride: 103 mmol/L (ref 98–111)
Creatinine, Ser: 0.95 mg/dL (ref 0.44–1.00)
GFR, Estimated: >60 mL/min (ref >60)
Glucose, Bld: 102 mg/dL — ABNORMAL HIGH (ref 70–99)
Potassium: 4.2 mmol/L (ref 3.5–5.1)
Sodium: 139 mmol/L (ref 135–145)
Total Bilirubin: 0.9 mg/dL (ref 0.0–1.2)
Total Protein: 7.7 g/dL (ref 6.5–8.1)

## 2023-10-10 LAB — FERRITIN: Ferritin: 191 ng/mL (ref 11–307)

## 2023-10-10 NOTE — Progress Notes (Addendum)
 Hematology-Oncology Clinic Note  Latoya Bickers, MD   Reason for Referral: Apocrine metaplasia of breast  Oncology History: I have reviewed her chart and materials related to her cancer extensively and collaborated history with the patient. Summary of oncologic history is as follows:  Oncology History  Apocrine metaplasia of breast, left  09/11/2023 Mammogram   IMPRESSION:  Indeterminate 0.5 cm mass in the central upper posterior left breast.   09/22/2023 Pathology Results   FINAL DIAGNOSIS   1. Breast, left, needle core biopsy, upper central :       -  FOCAL RADIAL SCAR/COMPLEX SCLEROSING LESION (3 MM) WITH ASSOCIATED FIBROCYSTIC CHANGE AND APOCRINE METAPLASIA WITH BACKGROUND BREAST CONSISTING PREDOMINANTLY OF ADIPOSE TISSUE WITH ONLY RARE DUCTULAR ELEMENTS.    10/10/2023 Initial Diagnosis   Apocrine metaplasia of breast, left       History of Presenting Illness: Latoya Bautista 53 y.o. female is here for recent abnormal pathology on breast biopsy.  Patient on her mammogram found a left breast lump that was later biopsied and showed a complex sclerosing lesion with apocrine metaplasia.  She saw Dr. Cherilyn Corn who is recommending a lumpectomy.  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  Patient also had whole body radiation once prior to her first bone marrow transplant.  Patient reports no complaints today, is of very good health.  She is a non-smoker, nonalcoholic.  Does not have a family history of breast cancer.  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54   dx age 64;   chemo;  whole body radiation;  x2 stem cell transplants @ [facility] 1982 & 1984   History of antineoplastic chemotherapy    child   History of basal cell carcinoma (BCC) excision 10/2016   left leg   History of hepatitis C 2012   pt contracted hep c from blood transfuions's as child in setting of ALL;   08/ 2012 liver bx advanced liver disease mild portal hyertensive gastropathy G2S3, completed 44 wks telaprevir trple bases therapy 12/ 2012 ;  cured and  had  egd 2016 no evidence [facility]   History of kidney stones 2011   History of melanoma in situ 07/2018   vulva bx   History of radiation therapy    whole body radiation in 1980s for ALL   Hyperlipidemia, mixed    Hypersensitivity pneumonitis (HCC)    Hypertension    Hypothyroidism    endocrinologist-- dr Montell Ao   ILD (interstitial lung disease) [facility])    pulmonologist-- dr Rosezella Conte   Intermittent palpitations    cardiologist--- dr j. branch;   monitor 03/ 2019  PACs/ PVCs   Osteoporosis    Primary amenorrhea    Seasonal allergies    Seizure  disorder [facility]) 1982   neurologist--- dr aChanning Commander;  since age 8 with first stem cell transplant in setting ALL   Squamous cell carcinoma of lateral tongue [facility]) 2021   oncologist--- dr Cheree Cords  ENT-- Dr Grace Laura;    recurrent dysplasia bx's since 2005 until 07/ 2021 small resection and larger excisional bx 07/ 2022 with positive margins;   01-18-2021 right partial glossectomy (less than half)   Thrombocytopenia [facility])     Surgical history: Past Surgical History:  Procedure Laterality Date   BONE MARROW BIOPSY  05/07/2011   BONE MARROW TRANSPLANT     BREAST BIOPSY Left 09/22/2023   MM LT BREAST BX W LOC DEV 1ST LESION IMAGE BX  SPEC STEREO GUIDE 09/22/2023 [facility]   BUNIONECTOMY     CATARACT EXTRACTION W/PHACO Right 04/25/2023   Procedure: CATARACT EXTRACTION PHACO AND INTRAOCULAR LENS PLACEMENT (IOC);  Surgeon: Tarri Farm, MD;  Location: [facility];  Service: Ophthalmology;  Laterality: Right;  CDE 1.97   CATARACT EXTRACTION W/PHACO Left 05/09/2023   Procedure: CATARACT EXTRACTION PHACO AND INTRAOCULAR LENS PLACEMENT (IOC);  Surgeon: Tarri Farm, MD;  Location: [facility];  Service: Ophthalmology;  Laterality: Left;  CDE 3.26   CHOLECYSTECTOMY, LAPAROSCOPIC  1994   ESOPHAGOGASTRODUODENOSCOPY  09/03/2016   EXOSTECTECTOMY TOE Right 09/04/2022   Procedure: EXOSTECTECTOMY TOE;  Surgeon: Charity Conch, DPM;  Location: [facility];  Service: Podiatry;  Laterality: Right;   FEMUR IM NAIL Right 05/06/2022   Procedure: INTRAMEDULLARY (IM) NAIL FEMORAL;  Surgeon: Laneta Pintos, MD;  Location: [facility];  Service: Orthopedics;  Laterality: Right;   MOHS SURGERY  2023   spring   PARTIAL GLOSSECTOMY  01/18/2021   @ [facility];  less than half , right lateral     Allergies:  is allergic to simvastatin, briviact [brivaracetam], asparaginase derivatives, elspar [asparaginase], keppra  [levetiracetam ], soap, and tape.  Medications:  Current Outpatient Medications  Medication Sig Dispense Refill   Ascorbic Acid (VITAMIN C) 1000 MG tablet Take 1,000 mg by mouth daily.     cholecalciferol (VITAMIN D ) 1000 units tablet Take 1,000 Units by mouth daily.     clobetasol ointment (TEMOVATE) 0.05 % Apply topically.     Coenzyme Q10 (COQ-10) 100 MG CAPS Take 1 capsule by mouth daily.     estradiol  (ESTRACE ) 1 MG tablet Take 1 tablet (1 mg total) by mouth daily. 30 tablet 11   famotidine  (PEPCID ) 20 MG tablet Take 1 tablet (20 mg total) by mouth 2 (two) times daily. (Patient taking differently: Take 20 mg by mouth daily.) 60 tablet 2   fenofibrate  54 MG tablet Take 54 mg by mouth daily.     folic acid  (FOLVITE ) 1 MG  tablet take 1 tablet once daily. 30 tablet 5   Garlic 1000 MG CAPS Take 1 capsule by mouth daily.     glucosamine-chondroitin 500-400 MG tablet Take 1 tablet by mouth 2 (two) times daily.     lamoTRIgine  (LAMICTAL  XR) 50 MG 24 hour tablet Take 3 tablets (150 mg total) by mouth 2 (two) times daily. (Patient taking differently: Take 150 mg by mouth 2 (two) times daily.) 540 tablet 3   levothyroxine  (SYNTHROID ) 125 MCG tablet Take 0.5 tablets (62.5 mcg total) by mouth daily. (Patient taking differently: Take 62.5 mcg by mouth daily.)     Lycopene 10 MG CAPS Take by mouth.     MAGNESIUM PO Take 1 tablet by mouth daily.     Menaquinone-7 (VITAMIN K2) 100 MCG CAPS  Take 1 capsule by mouth daily.     Metoprolol  Tartrate 75 MG TABS Take 1 tablet (75 mg total) by mouth 2 (two) times daily. 60 tablet 11   Multiple Vitamins-Minerals (CENTRUM PO) Take 1 tablet by mouth daily.       Omega-3 Fatty Acids (OMEGA-3 FISH OIL PO) Take 1 capsule by mouth daily.     pantoprazole  (PROTONIX ) 40 MG tablet Take 1 tablet (40 mg total) by mouth daily. (Patient taking differently: Take 40 mg by mouth daily before breakfast.) 90 tablet 1   Probiotic Product (PROBIOTIC DAILY) CAPS Take 1 capsule by mouth daily.     progesterone  (PROMETRIUM ) 100 MG capsule Take 1 capsule (100 mg total) by mouth daily for 15 days per month. 45 capsule 5   Quercetin 500 MG CAPS Take 1 capsule by mouth daily.     rosuvastatin  (CRESTOR ) 40 MG tablet Take 1 tablet (40 mg total) by mouth at bedtime. 90 tablet 3   sodium bicarbonate  325 MG tablet Take 325 mg by mouth 2 (two) times daily.     Vitamin D , Ergocalciferol , (DRISDOL ) 1.25 MG (50000 UNIT) CAPS capsule Take 1 capsule (50,000 Units total) by mouth every OTHER week as directed (Patient taking differently: Take 50,000 Units by mouth See admin instructions. Take 1 capsule (50,000 units) by mouth every other week.) 20 capsule 1   VITAMIN E PO Take 1 capsule by mouth daily.     zoledronic  acid  (RECLAST ) 5 MG/100ML SOLN injection Inject 5 mg into the vein once.     No current facility-administered medications for this visit.    Review of Systems: Constitutional: Denies fevers, chills or abnormal night sweats Eyes: Denies blurriness of vision, double vision or watery eyes Ears, nose, mouth, throat, and face: Denies mucositis or sore throat Respiratory: Denies cough, dyspnea or wheezes Cardiovascular: Denies palpitation, chest discomfort or lower extremity swelling Gastrointestinal:  Denies nausea, heartburn or change in bowel habits Skin: Denies abnormal skin rashes Lymphatics: Denies new lymphadenopathy or easy bruising Neurological:Denies numbness, tingling or new weaknesses Behavioral/Psych: Mood is stable, no new changes  All other systems were reviewed with the patient and are negative.  Physical Examination: ECOG PERFORMANCE STATUS: 0 - Asymptomatic  Vitals:   10/10/23 1108  BP: 138/65  Pulse: 62  Resp: 16  Temp: 97.9 F (36.6 C)  SpO2: 100%   Filed Weights   10/10/23 1108  Weight: 134 lb 9.6 oz (61.1 kg)    GENERAL:alert, no distress and comfortable HEENT: Left side of the tongue with scar from glossectomy LYMPH:  no palpable lymphadenopathy in the cervical, axillary or inguinal BREAST:: Biopsy spot on left breast, mildly tender.  No masses palpated on bilateral breast.  No axillary lymphadenopathy palpated LUNGS: clear to auscultation and percussion with normal breathing effort HEART: regular rate & rhythm and no murmurs and no lower extremity edema ABDOMEN:abdomen soft, non-tender and normal bowel sounds Musculoskeletal:no cyanosis of digits and no clubbing  PSYCH: alert & oriented x 3 with fluent speech   Laboratory Data: I have reviewed the data as listed Lab Results  Component Value Date   WBC 8.7 10/10/2023   HGB 14.3 10/10/2023   HCT 41.8 10/10/2023   MCV 99.8 10/10/2023   PLT 170 10/10/2023   Recent Labs    11/12/22 1258 10/10/23 1146   NA 132* 139  K 4.0 4.2  CL 100 103  CO2 23 23  GLUCOSE 114* 102*  BUN 16 14  CREATININE 1.09* 0.95  CALCIUM   9.5 10.3  GFRNONAA >60 >60  PROT 6.6 7.7  ALBUMIN 4.0 4.6  AST 30 35  ALT 24 29  ALKPHOS 69 52  BILITOT 0.6 0.9    Radiographic Studies: I have personally reviewed the radiological images as listed and agreed with the findings in the report.  MM LT BREAST BX W LOC DEV 1ST LESION IMAGE BX SPEC STEREO GUIDE Addendum: ADDENDUM REPORT: 09/23/2023 20:35   ADDENDUM:  Pathology revealed FOCAL RADIAL SCAR/COMPLEX SCLEROSING LESION (3  MM) WITH ASSOCIATED FIBROCYSTIC CHANGE AND APOCRINE METAPLASIA WITH  BACKGROUND BREAST CONSISTING PREDOMINANTLY OF ADIPOSE TISSUE WITH  ONLY RARE DUCTULAR ELEMENTS of the LEFT breast, upper central, 12  o'clock, (ribbon clip). This was found to be concordant by Dr. Dina  Arceo, with excision recommended.   Pathology results were discussed with the patient by telephone. The  patient reported doing well after the biopsy with tenderness at the  site. Post biopsy instructions and care were reviewed and questions  were answered. The patient was encouraged to call [facility] for any additional concerns. My direct phone  number was provided.   Surgical consultation request was sent to [facility] in [place], [place] via secure EPIC message on Sep 23, 2023.   Pathology results reported by Kraig Peru, RN on 09/23/2023.   Electronically Signed    By: Dina  Arceo M.D.    On: 09/23/2023 20:35 Narrative: CLINICAL DATA:  Left breast mass.  EXAM: LEFT BREAST STEREOTACTIC CORE NEEDLE BIOPSY  COMPARISON:  Previous exam(s).  FINDINGS: The patient and I discussed the procedure of stereotactic-guided biopsy including benefits and alternatives. We discussed the high likelihood of a successful procedure. We discussed the risks of the procedure including infection, bleeding, tissue injury,  clip migration, and inadequate sampling. Informed written consent was given. The usual time out protocol was performed immediately prior to the procedure.  Using sterile technique and 1% lidocaine  1% lidocaine  with epinephrine  as local anesthetic, under stereotactic guidance, a 9 gauge vacuum assisted device was used to perform core needle biopsy of calcifications in the lower outer quadrant of the left breast using a superior to inferior approach.  Lesion quadrant: 12 o'clock  At the conclusion of the procedure, ribbon shaped tissue marker clip was deployed into the biopsy cavity. Follow-up 2-view mammogram was performed and dictated separately.  IMPRESSION: Stereotactic-guided biopsy of the left breast. No apparent complications.  Electronically Signed: By: Dina  Arceo M.D. On: 09/22/2023 09:10    ASSESSMENT & PLAN:  Patient is a 53 year old female referred for abnormal breast biopsy pathology   Apocrine metaplasia of breast, left Patient has Focal complex sclerosing lesion with associated apocrine metaplasia of left breast Patient is scheduled for lumpectomy  - Agree with lumpectomy for the patient as this is a precancerous lesion.  Also, can consider mastectomy considering patient is at very high risk with a history of whole body radiation twice and multiple cancerous lesions  Return to clinic 4 weeks after lumpectomy to discuss further management  Squamous cell carcinoma of lateral tongue [facility]) Patient reports that she has been having tongue biopsy for multiple areas of dysplasia on her right tongue in 2005, 2007, 2012.  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 Status post right partial glossectomy on 01/18/2021 at [facility] by Dr. Jesus Morones.  No palpable neck lymphadenopathy at this time.  No abnormal lesions on tongue.  - Continue to follow-up with ENT  Orders Placed This Encounter  Procedures   CBC with  Differential/Platelet    Standing Status:   Future    Number of Occurrences:   1    Expected Date:   10/10/2023    Expiration Date:   10/09/2024   Comprehensive metabolic panel with GFR    Standing Status:   Future    Number of Occurrences:   1    Expected Date:   10/10/2023    Expiration Date:   10/09/2024   Ferritin    Standing Status:   Future    Number of Occurrences:   1    Expected Date:   10/10/2023    Expiration Date:   10/09/2024   Folate    Standing Status:   Future    Number of Occurrences:   1    Expected Date:   10/10/2023    Expiration Date:   10/09/2024   Vitamin B12    Standing Status:   Future    Number of Occurrences:   1    Expected Date:   10/10/2023    Expiration Date:   10/09/2024   Iron and TIBC    Standing Status:   Future    Number of Occurrences:   1    Expected Date:   10/10/2023    Expiration Date:   10/09/2024    The total time spent in the appointment was 60 minutes encounter with patients including review of chart and various tests results, discussions about plan of care and coordination of care plan   All questions were answered. The patient knows to call the clinic with any problems, questions or concerns. No barriers to learning was detected.  Eduardo Grade, MD 6/6/20253:06 PM

## 2023-10-10 NOTE — Assessment & Plan Note (Signed)
 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 Status post right partial glossectomy on 01/18/2021 at [facility] by Dr. Jesus Morones.  No palpable neck lymphadenopathy at this time.  No abnormal lesions on tongue.  - Continue to follow-up with ENT

## 2023-10-10 NOTE — Assessment & Plan Note (Signed)
 Patient has Focal complex sclerosing lesion with associated apocrine metaplasia of left breast Patient is scheduled for lumpectomy  - Agree with lumpectomy for the patient as this is a precancerous lesion.  Also, can consider mastectomy considering patient is at very high risk with a history of whole body radiation twice and multiple cancerous lesions  Return to clinic 4 weeks after lumpectomy to discuss further management

## 2023-10-13 LAB — FOLATE: Folate: >40 ng/mL (ref >5.9)

## 2023-10-13 NOTE — Telephone Encounter (Signed)
 FYI

## 2023-10-14 ENCOUNTER — Ambulatory Visit
Admission: RE | Admit: 2023-10-14 | Discharge: 2023-10-14 | Disposition: A | Discharge status: Home / Self Care | Source: Ambulatory Visit | Attending: Pulmonary Disease

## 2023-10-14 ENCOUNTER — Other Ambulatory Visit: Payer: Medicaid Other

## 2023-10-14 DIAGNOSIS — J849 Interstitial pulmonary disease, unspecified: Secondary | ICD-10-CM

## 2023-10-15 ENCOUNTER — Telehealth: Payer: Self-pay

## 2023-10-15 NOTE — Telephone Encounter (Signed)
 Received surgical assessment from [facility] surgical associates. Excisional breast biopsy scheduled 11/06/2023.  Pending appt with Dr. Waylan Haggard 10/30/2023. Pt is aware to keep appointment.

## 2023-10-16 ENCOUNTER — Ambulatory Visit (INDEPENDENT_AMBULATORY_CARE_PROVIDER_SITE_OTHER): Admitting: Surgery

## 2023-10-16 ENCOUNTER — Encounter: Payer: Self-pay | Admitting: Surgery

## 2023-10-16 VITALS — BP 126/76 | HR 81 | Temp 97.7°F | Resp 16 | Ht 59.0 in | Wt 134.0 lb

## 2023-10-16 DIAGNOSIS — N6489 Other specified disorders of breast: Secondary | ICD-10-CM | POA: Diagnosis not present

## 2023-10-17 NOTE — Progress Notes (Signed)
 [facility] Note   HPI:  53 y.o. Female presents to clinic to discuss her surgical options regarding her left breast mass/findings.  Patient was reading through the lumpectomy information that was provided after our last visit, and wanted to discuss if she was a candidate for mastectomy for her left breast mass/findings.  She expressed that she does not want to undergo any repeat surgery/anesthesia if they can be avoided.  She states that she talked with her family as well, and she does have history of abdominal cancer and throat cancer in a great aunt and great uncle.  She still does not have any family history of breast cancer that she knows of, though she is not close with her father, and does not know his family history.  Otherwise, she has no significant changes to her medical history  Review of Systems:  All other review of systems: otherwise negative   Vital Signs:  BP 126/76   Pulse 81   Temp 97.7 F (36.5 C) (Oral)   Resp 16   Ht 4' 11 (1.499 m)   Wt 134 lb (60.8 kg)   SpO2 97%   BMI 27.06 kg/m    Physical Exam:  Physical Exam Vitals reviewed.  Constitutional:      Appearance: Normal appearance.  Chest:  Breasts:    Right: No swelling, bleeding, inverted nipple, mass, nipple discharge, skin change or tenderness.     Left: No swelling, bleeding, inverted nipple, mass, nipple discharge, skin change or tenderness.   Neurological:     Mental Status: She is alert.     Laboratory studies: None  Imaging:  Bilateral screening mammogram (08/21/2023): IMPRESSION: Further evaluation is suggested for a possible mass in the left breast.   RECOMMENDATION: Diagnostic mammogram and possibly ultrasound of the left breast. (Code:[id number])   The patient will be contacted regarding the findings, and additional imaging will be scheduled.   BI-RADS CATEGORY  0: Incomplete: Need additional imaging evaluation.   Left breast diagnostic mammogram  (09/11/2023): IMPRESSION: Indeterminate 0.5 cm mass in the central upper posterior left breast.   RECOMMENDATION: Recommend stereotactic guided core biopsy of the mass in the left breast.   I have discussed the findings and recommendations with the patient. If applicable, a reminder letter will be sent to the patient regarding the next appointment.   BI-RADS CATEGORY  4: Suspicious.   Left breast ultrasound (09/11/2023): IMPRESSION: Indeterminate 0.5 cm mass in the central upper posterior left breast.   RECOMMENDATION: Recommend stereotactic guided core biopsy of the mass in the left breast.   I have discussed the findings and recommendations with the patient. If applicable, a reminder letter will be sent to the patient regarding the next appointment.   BI-RADS CATEGORY  4: Suspicious.   Left breast stereotactic core needle biopsy (09/22/2023): IMPRESSION: Stereotactic-guided biopsy of the left breast. No apparent complications.   ADDENDUM REPORT: 09/23/2023 20:35   ADDENDUM: Pathology revealed FOCAL RADIAL SCAR/COMPLEX SCLEROSING LESION (3 MM) WITH ASSOCIATED FIBROCYSTIC CHANGE AND APOCRINE METAPLASIA WITH BACKGROUND BREAST CONSISTING PREDOMINANTLY OF ADIPOSE TISSUE WITH ONLY RARE DUCTULAR ELEMENTS of the LEFT breast, upper central, 12 o'clock, (ribbon clip). This was found to be concordant by Dr. Dina Arceo, with excision recommended.   Pathology results were discussed with the patient by telephone. The patient reported doing well after the biopsy with tenderness at the site. Post biopsy instructions and care were reviewed and questions were answered. The patient was encouraged to call [facility] for any additional concerns. My direct phone number was provided.   Surgical consultation request was sent to [facility] in [place], [place] via secure EPIC message on Sep 23, 2023.   Assessment:  53 y.o. yo Female who wants  to discuss her surgical options after her recent diagnosis of radial scar and complex sclerosing lesion on the left breast.  Plan:  -I discussed with the patient that while she does have a history of total body radiation, she does not have any other significant risk factors that increase her risk for breast cancer (such as family history or known genetic conditions).   -We also discussed that since she does not have a cancer diagnosis, her insurance company may not cover mastectomy for a 1.5 cm area of concern in her left breast. -I did explain that I would be happy to reach out to the insurance company to see if they were willing to cover a mastectomy for her. -We discussed the risk from the operations (lumpectomy and mastectomy) are similar including risk of bleeding, risk of infection, and risk of needing additional surgeries. We have discussed the likely need for an overnight stay with a mastectomy and a drain that will remain in place for about 1 week.   -We also discussed that, regardless of lumpectomy versus mastectomy, if she is diagnosed with breast cancer, she would need to undergo an additional surgery in the future for lymph node biopsy, so mastectomy does not necessarily decrease the chance of her needing a reoperation -After further discussions, patient has decided to proceed with scheduled lumpectomy.  Advised that if she changes her mind, she should reach out to my office and we will reach out to her insurance company  All of the above recommendations were discussed with the patient, and all of patient's questions were answered to her expressed satisfaction.  Note: Portions of this report may have been transcribed using voice recognition software. Every effort has been made to ensure accuracy; however, inadvertent computerized transcription errors may still be present.   Lidia Reels, DO [facility] [address] [place] [place] [place], [place]  [address] [phone] (office)

## 2023-10-22 ENCOUNTER — Ambulatory Visit: Payer: Self-pay | Admitting: Pulmonary Disease

## 2023-10-28 ENCOUNTER — Ambulatory Visit (HOSPITAL_COMMUNITY)
Admission: RE | Admit: 2023-10-28 | Discharge: 2023-10-28 | Disposition: A | Discharge status: Home / Self Care | Source: Ambulatory Visit | Attending: Surgery | Admitting: Surgery

## 2023-10-28 ENCOUNTER — Other Ambulatory Visit (HOSPITAL_COMMUNITY): Payer: Self-pay | Admitting: Surgery

## 2023-10-28 ENCOUNTER — Encounter (HOSPITAL_COMMUNITY): Payer: Self-pay

## 2023-10-28 DIAGNOSIS — R928 Other abnormal and inconclusive findings on diagnostic imaging of breast: Secondary | ICD-10-CM

## 2023-10-28 DIAGNOSIS — N6320 Unspecified lump in the left breast, unspecified quadrant: Secondary | ICD-10-CM

## 2023-10-28 HISTORY — PX: BREAST BIOPSY: SHX20

## 2023-10-28 MED ORDER — LIDOCAINE HCL (PF) 2 % IJ SOLN
INTRAMUSCULAR | Status: AC
Start: 2023-10-28 — End: 2023-10-28
  Filled 2023-10-28: qty 10

## 2023-10-30 ENCOUNTER — Encounter: Payer: Self-pay | Admitting: Pulmonary Disease

## 2023-10-30 ENCOUNTER — Ambulatory Visit: Admitting: Pulmonary Disease

## 2023-10-30 VITALS — BP 110/80 | HR 63 | Ht 59.0 in | Wt 134.6 lb

## 2023-10-30 DIAGNOSIS — J849 Interstitial pulmonary disease, unspecified: Secondary | ICD-10-CM | POA: Diagnosis not present

## 2023-10-30 NOTE — Progress Notes (Signed)
 Latoya Bautista    [id number]    March 23, 1971  Primary Care Physician:Hall, Latoya PEDLAR, MD  Referring Physician: Shona Latoya PEDLAR, MD [address] [place] Bautista,  [place] [address]  Chief complaint:  Follow-up for hypersensitivity pneumonitis Exposure to birds and mold  HPI: 53 y.o.  with seizure disorder, hepatitis C, cirrhosis, tongue cancer referred for evaluation of abnormal lung imaging, dyspnea  Complains of dyspnea on exertion for the past 6 months.  No cough, sputum production, wheezing  She has history of childhood ALL with bone marrow transplant x2, whole body radiation Tongue cancer with partial glossectomy on January 18, 2021 with intermittent bleeding.  Her ENT doctor is at [facility].  Recent PET scan did not show any residual cancer but there was concern for bilateral interstitial infiltrates suggestive of interstitial lung disease.  Started on prednisone  at 50 mg in the beginning of January 2023 for a CT scan showing changes consistent with hypersensitivity pneumonitis and hypersensitivity panel with positive for pigeon and mold  She got rid of the parakeets in December 2022. Finished prednisone  taper in May 2023.  Went on full disability in the summer 2023 and stayed away from work Admitted in December 2023 after accidental mechanical fall.  Noted to have AKI, chronic hyponatremia.   She underwent cataract surgery in December 2024, January 2025 under MAC anesthesia, right toe surgery in May 2024 under MAC anesthesia and intramedullary nailing of right femur fracture in January 2024 under general anesthesia without any pulmonary complications.   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  No feather pillows or comforter Smoking history: Never smoker Travel history: No significant travel  history Relevant family history: No family history of lung disease  Interim history: Discussed the use of AI scribe software for clinical note transcription with the patient, who gave verbal consent to proceed.  History of Present Illness Latoya Bautista is a 53 year old female with a history of leukemia, tongue cancer, and skin cancers who presents for pulmonary evaluation prior to lumpectomy.  She is scheduled for a lumpectomy on July 3rd due to concerns about potential malignancy, with the biopsy indicating pre-cancerous lesions. Her history of leukemia, tongue cancer, and skin cancers raises the concern for susceptibility to malignancy.  Her CT scan has shown some scarring and mild inflammation, with a history of exposures and pneumonitis. The CT scan findings are stable, and she has not experienced any worsening of symptoms.    Outpatient Encounter Medications as of 10/30/2023  Medication Sig   Ascorbic Acid (VITAMIN C) 1000 MG tablet Take 1,000 mg by mouth daily.   cholecalciferol (VITAMIN D ) 1000 units tablet Take 1,000 Units by mouth daily.   clobetasol ointment (TEMOVATE) 0.05 % Apply topically.   Coenzyme Q10 (COQ-10) 100 MG CAPS Take 1 capsule by mouth daily.   estradiol  (ESTRACE ) 1 MG tablet Take 1 tablet (1 mg total) by mouth daily.   famotidine  (PEPCID ) 20 MG tablet Take 1 tablet (20 mg total) by mouth 2 (two) times daily.   fenofibrate  54 MG tablet Take 54 mg by mouth daily.   folic acid  (FOLVITE ) 1 MG tablet take 1 tablet once daily.   Garlic 1000 MG CAPS Take 1 capsule by mouth daily.   glucosamine-chondroitin 500-400 MG  tablet Take 1 tablet by mouth 2 (two) times daily.   lamoTRIgine  (LAMICTAL  XR) 50 MG 24 hour tablet Take 3 tablets (150 mg total) by mouth 2 (two) times daily.   levothyroxine  (SYNTHROID ) 125 MCG tablet Take 0.5 tablets (62.5 mcg total) by mouth daily.   Lycopene 10 MG CAPS Take by mouth.   MAGNESIUM PO Take 1 tablet by mouth daily.   Menaquinone-7  (VITAMIN K2) 100 MCG CAPS Take 1 capsule by mouth daily.   Metoprolol  Tartrate 75 MG TABS Take 1 tablet (75 mg total) by mouth 2 (two) times daily.   Multiple Vitamins-Minerals (CENTRUM PO) Take 1 tablet by mouth daily.     Omega-3 Fatty Acids (OMEGA-3 FISH OIL PO) Take 1 capsule by mouth daily.   pantoprazole  (PROTONIX ) 40 MG tablet Take 1 tablet (40 mg total) by mouth daily.   Probiotic Product (PROBIOTIC DAILY) CAPS Take 1 capsule by mouth daily.   progesterone  (PROMETRIUM ) 100 MG capsule Take 1 capsule (100 mg total) by mouth daily for 15 days per month.   Quercetin 500 MG CAPS Take 1 capsule by mouth daily.   rosuvastatin  (CRESTOR ) 40 MG tablet Take 1 tablet (40 mg total) by mouth at bedtime.   sodium bicarbonate  325 MG tablet Take 325 mg by mouth 2 (two) times daily.   Vitamin D , Ergocalciferol , (DRISDOL ) 1.25 MG (50000 UNIT) CAPS capsule Take 1 capsule (50,000 Units total) by mouth every OTHER week as directed (Patient taking differently: Take 50,000 Units by mouth See admin instructions. Take 1 capsule (50,000 units) by mouth every other week.)   VITAMIN E PO Take 1 capsule by mouth daily.   zoledronic  acid (RECLAST ) 5 MG/100ML SOLN injection Inject 5 mg into the vein once.   No facility-administered encounter medications on file as of 10/30/2023.   Physical Exam: Blood pressure 110/80, pulse 63, height 4' 11 (1.499 m), weight 134 lb 9.6 oz (61.1 kg), SpO2 100%. Gen:      No acute distress HEENT:  EOMI, sclera anicteric Neck:     No masses; no thyromegaly Lungs:    Clear to auscultation bilaterally; normal respiratory effort CV:         Regular rate and rhythm; no murmurs Abd:      + bowel sounds; soft, non-tender; no palpable masses, no distension Ext:    No edema; adequate peripheral perfusion Neuro: alert and oriented x 3 Psych: normal mood and affect   Data Reviewed: Imaging: PET scan 12/07/2020-emphysematous changes, bilateral groundglass opacities concerning for interstitial  lung disease.  High-res CT 04/10/2021-interstitial lung disease and alternate pattern.  NSIP versus chronic hypersensitivity pneumonitis, aortic and coronary atherosclerosis.  High-res CT 10/26/2021-minimal progression of pulmonary fibrosis in alternate pattern.  High-res CT 07/09/2022-stable pulmonary fibrosis and alternate pattern.    High-res CT 10/14/2023-pulmonary parenchymal pattern of interstitial lung disease in alternate pattern.  Stable compared to prior I have reviewed the images personally.  PFTs: 03/19/2021 FVC 1.01 [34%], FEV1 0.97 [41%], F/F 96, TLC 1.82 [42%], DLCO 4.42 [25%]  04/09/2022 FVC 1.37 [46%], FEV1 1.31 [56%], F/F96, TLC 2.83 [65%], DLCO 9.57 [54%] Severe restriction  04/15/2023 FVC 1.57 [54%], FEV1 1.45 [62%], F/F92, TLC 3.09 [91%], DLCO 11.74 [67%] Moderate restriction, diffusion defect  Cardiac: Echocardiogram 07/28/2017 LVEF 55-60%, grade 1 diastolic dysfunction.  PA systolic pressure could not be accurately estimated  Labs: CTD serologies 05/03/2021-negative Hypersensitivity pneumonitis panel 04/30/2021-positive for A pullulans antibody and pigeon serum antibody Assessment & Plan Hypersensitivity pneumonitis Likely from patient exposure and mold  exposure given positive antibodies for A pullulans and pigeon serum.  She has gotten rid of the birds in December 2022. May have mold exposure at work as she reports presence of mold and chemicals in the old nursing home and her symptoms are most severe when she is at work and at home. She is avoiding work exposure by taking disability.  Finished prednisone  taper in May 2023  Improvement in lung function tests since last year, likely due to cessation of work exposure and treatment with prednisone  in May 2023. Moderate impairment remains. Last CT shows stability  - Monitor with annual CT scan and lung function test. If stable for many years, extend monitoring to every two or three years. - Discussed potential for  progressive scarring to worsen without exposure, which may require antifibrotic treatment in the future if detected on follow-up scans..  Breast lesion Precancerous on biopsy.  Scheduled for lumpectomy on July 3.  Preop pulmonary evaluation already completed.   Plan/Recommendations: Follow-up high-res CT and PFTs in 12 months  Lonna Coder MD [facility] 10/30/2023, 8:24 AM  CC: Latoya Latoya PEDLAR, MD

## 2023-10-30 NOTE — Patient Instructions (Signed)
 VISIT SUMMARY:  You came in today for a pulmonary evaluation before your upcoming lumpectomy. Given your history of leukemia, tongue cancer, and skin cancers, we are being cautious even though your biopsy showed non-cancerous lesions. Your CT scan showed some lung scarring and mild inflammation, but these findings are stable and not causing you any symptoms. You have also recovered well from your hip surgery last year.  YOUR PLAN:  -LUNG SCARRING AND MILD INFLAMMATION: Your CT scan shows well-managed lung scarring and mild inflammation, likely due to past exposures causing hypersensitivity pneumonitis. This means your lungs have some damage and mild swelling from past irritants. We will monitor this with an annual CT scan and lung function test. If your condition remains stable for many years, we may extend the monitoring to every two or three years. We also discussed that the scarring could worsen even without new exposures, which might require treatment in the future.  -LEUKEMIA: You were diagnosed with leukemia as a child and treated until age 53. There are no current issues related to leukemia.  -TONGUE CANCER: You have a history of tongue cancer, but there are no current issues related to it.  -SKIN CANCER: You have a history of skin cancer, but there are no current issues related to it.  -HIP FRACTURE AND SURGERY: You had hip surgery for a fracture last year and have recovered well with no current issues.  INSTRUCTIONS:  We will continue to monitor your lung condition with an annual CT scan and lung function test. If your condition remains stable for many years, we may extend the monitoring to every two or three years. Your lumpectomy is scheduled for July 3rd. Please follow up with us  after your surgery to discuss any further steps.

## 2023-10-31 NOTE — Telephone Encounter (Signed)
 Risk assessment was faxed to [facility]

## 2023-11-04 ENCOUNTER — Encounter (HOSPITAL_COMMUNITY): Payer: Self-pay

## 2023-11-04 ENCOUNTER — Encounter (HOSPITAL_COMMUNITY)
Admission: RE | Admit: 2023-11-04 | Discharge: 2023-11-04 | Disposition: A | Discharge status: Home / Self Care | Source: Ambulatory Visit | Attending: Surgery | Admitting: Surgery

## 2023-11-04 DIAGNOSIS — Z01818 Encounter for other preprocedural examination: Secondary | ICD-10-CM | POA: Diagnosis present

## 2023-11-04 NOTE — Patient Instructions (Signed)
 Latoya Bautista  11/04/2023     @PREFPERIOPPHARMACY @   Your procedure is scheduled on  11/06/2023.   Report to [facility] at  0600 A.M.   Call this number if you have problems the morning of surgery:  [phone]  If you experience any cold or flu symptoms such as cough, fever, chills, shortness of breath, etc. between now and your scheduled surgery, please notify us  at the above number.   Remember:  Do not eat after midnight.   You may drink clear liquids until  0330 am on 11/06/2023.    Clear liquids allowed are:                    Water , Juice (No red color; non-citric and without pulp; diabetics please choose diet or no sugar options), Carbonated beverages (diabetics please choose diet or no sugar options), Clear Tea (No creamer, milk, or cream, including half & half and powdered creamer), Black Coffee Only (No creamer, milk or cream, including half & half and powdered creamer), and Clear Sports drink (No red color; diabetics please choose diet or no sugar options)    Take these medicines the morning of surgery with A SIP OF WATER            pepcid , lamictal , levothyroxine , pantoprazole , metoprolol .    Do not wear jewelry, make-up or nail polish, including gel polish,  artificial nails, or any other type of covering on natural nails (fingers and  toes).  Do not wear lotions, powders, or perfumes, or deodorant.  Do not shave 48 hours prior to surgery.  Men may shave face and neck.  Do not bring valuables to the hospital.  [facility] is not responsible for any belongings or valuables.  Contacts, dentures or bridgework may not be worn into surgery.  Leave your suitcase in the car.  After surgery it may be brought to your room.  For patients admitted to the hospital, discharge time will be determined by your treatment team.  Patients discharged the day of surgery will not be allowed to drive home and must have someone with them for 24 hours.    Special  instructions:   DO NOT smoke tobacco or vape for 24 hours before your procedure.  Please read over the following fact sheets that you were given. Pain Booklet, Coughing and Deep Breathing, Surgical Site Infection Prevention, Anesthesia Post-op Instructions, and Care and Recovery After Surgery      Lumpectomy, Care After The following information offers guidance on how to care for yourself after your procedure. Your health care provider may also give you more specific instructions. If you have problems or questions, contact your health care provider. What can I expect after the procedure? After the procedure, it is common to have: Some pain or redness at the incision site. Breast swelling. Breast tenderness. Stiffness in your arm or shoulder. A change in the shape and feel of your breast. Scar tissue that feels hard to the touch in the area where the lump was removed. Follow these instructions at home: Medicines Take over-the-counter and prescription medicines only as told by your health care provider. If you were prescribed an antibiotic, take it as told by your health care provider. Do not stop taking the antibiotic even if you start to feel better. Ask your health care provider if the medicine prescribed to you: Requires you to avoid driving or using machinery. Can cause constipation. You  may need to take these actions to prevent or treat constipation: Drink enough fluid to keep your urine pale yellow. Take over-the-counter or prescription medicines. Eat foods that are high in fiber, such as beans, whole grains, and fresh fruits and vegetables. Limit foods that are high in fat and processed sugars, such as fried or sweet foods. Incision care     Follow instructions from your health care provider about how to take care of your incision. Make sure you: Wash your hands with soap and water  for at least 20 seconds before and after you change your bandage (dressing). If soap and water   are not available, use hand sanitizer. Change your dressing as told by your health care provider. Leave stitches (sutures), skin glue, or adhesive strips in place. These skin closures may need to stay in place for 2 weeks or longer. If adhesive strip edges start to loosen and curl up, you may trim the loose edges. Do not remove adhesive strips completely unless your health care provider tells you to do that. Check your incision area every day for signs of infection. Check for: More redness, swelling, or pain. Fluid or blood. Warmth. Pus or a bad smell. Keep your dressing clean and dry. If you were sent home with a surgical drain in place, follow instructions from your health care provider about emptying it. Bathing Do not take baths, swim, or use a hot tub until your health care provider approves. Ask your health care provider if you may take showers. You may only be allowed to take sponge baths. Activity Rest as told by your health care provider. Do not sit for a long time without moving. Get up to take short walks every 1-2 hours. This will improve blood flow and breathing. Ask for help if you feel weak or unsteady. Be careful to avoid any activities that could cause an injury to your arm on the side of your surgery. Do not lift anything that is heavier than 10 lb (4.5 kg), or the limit that you are told, until your health care provider says that it is safe. Avoid lifting with the arm that is on the side of your surgery. Do not carry heavy objects on your shoulder on the side of your surgery. Do exercises to keep your shoulder and arm from getting stiff and swollen. Talk with your health care provider about which exercises are safe for you. Return to your normal activities as told by your health care provider. Ask your health care provider what activities are safe for you. General instructions Wear a supportive bra as told by your health care provider. Raise (elevate) your arm above the level  of your heart while you are sitting or lying down. Do not wear tight jewelry on your arm, wrist, or fingers on the side of your surgery. Wear compression stockings as told by your health care provider. These stockings help to prevent blood clots and reduce swelling in your legs. If you had any lymph nodes removed during your procedure, be sure to tell all of your health care providers. It is important to share this information before you have certain procedures, such as blood tests or blood pressure measurements. Keep all follow-up visits. You may need to be screened for extra fluid around the lymph nodes and swelling in the breast and arm (lymphedema). Contact a health care provider if: You develop a rash. You have a fever. Your pain worsens or pain medicine is not working. You have swelling, weakness, or  numbness in your arm that does not improve after a few weeks. You have new swelling in your breast. You have any of these signs of infection: More redness, swelling, or pain in your incision area. Fluid or blood coming from your incision. Warmth coming from the incision area. Pus or a bad smell coming from your incision. Get help right away if: You have very bad pain in your breast or arm. You have swelling in your legs or arms. You have redness, warmth, or pain in your leg or arm. You have chest pain. You have difficulty breathing. These symptoms may be an emergency. Get help right away. Call 911. Do not wait to see if the symptoms will go away. Do not drive yourself to the hospital. Summary After the procedure, it is common to have breast tenderness, swelling in your breast, and stiffness in your arm and shoulder. Follow instructions from your health care provider about how to take care of your incision. Do not lift anything that is heavier than 10 lb (4.5 kg), or the limit that you are told, until your health care provider says that it is safe. Avoid lifting with the arm that is on  the side of your surgery. If you had any lymph nodes removed during your procedure, be sure to tell all of your health care providers. This information is not intended to replace advice given to you by your health care provider. Make sure you discuss any questions you have with your health care provider. Document Revised: 07/01/2021 Document Reviewed: 07/01/2021 Elsevier Patient Education  2024 [employer]General Anesthesia, Adult, Care After The following information offers guidance on how to care for yourself after your procedure. Your health care provider may also give you more specific instructions. If you have problems or questions, contact your health care provider. What can I expect after the procedure? After the procedure, it is common for people to: Have pain or discomfort at the IV site. Have nausea or vomiting. Have a sore throat or hoarseness. Have trouble concentrating. Feel cold or chills. Feel weak, sleepy, or tired (fatigue). Have soreness and body aches. These can affect parts of the body that were not involved in surgery. Follow these instructions at home: For the time period you were told by your health care provider:  Rest. Do not participate in activities where you could fall or become injured. Do not drive or use machinery. Do not drink alcohol. Do not take sleeping pills or medicines that cause drowsiness. Do not make important decisions or sign legal documents. Do not take care of children on your own. General instructions Drink enough fluid to keep your urine pale yellow. If you have sleep apnea, surgery and certain medicines can increase your risk for breathing problems. Follow instructions from your health care provider about wearing your sleep device: Anytime you are sleeping, including during daytime naps. While taking prescription pain medicines, sleeping medicines, or medicines that make you drowsy. Return to your normal activities as told by your  health care provider. Ask your health care provider what activities are safe for you. Take over-the-counter and prescription medicines only as told by your health care provider. Do not use any products that contain nicotine or tobacco. These products include cigarettes, chewing tobacco, and vaping devices, such as e-cigarettes. These can delay incision healing after surgery. If you need help quitting, ask your health care provider. Contact a health care provider if: You have nausea or vomiting that does not get better with medicine.  You vomit every time you eat or drink. You have pain that does not get better with medicine. You cannot urinate or have bloody urine. You develop a skin rash. You have a fever. Get help right away if: You have trouble breathing. You have chest pain. You vomit blood. These symptoms may be an emergency. Get help right away. Call 911. Do not wait to see if the symptoms will go away. Do not drive yourself to the hospital. Summary After the procedure, it is common to have a sore throat, hoarseness, nausea, vomiting, or to feel weak, sleepy, or fatigue. For the time period you were told by your health care provider, do not drive or use machinery. Get help right away if you have difficulty breathing, have chest pain, or vomit blood. These symptoms may be an emergency. This information is not intended to replace advice given to you by your health care provider. Make sure you discuss any questions you have with your health care provider. Document Revised: 07/20/2021 Document Reviewed: 07/20/2021 Elsevier Patient Education  2024 [employer] Inc.How to Use Chlorhexidine  at Home in the Shower Chlorhexidine  gluconate (CHG) is a germ-killing (antiseptic) wash that's used to clean the skin. It can get rid of the germs that normally live on the skin and can keep them away for about 24 hours. If you're having surgery, you may be told to shower with CHG at home the night before  surgery. This can help lower your risk for infection. To use CHG wash in the shower, follow the steps below. Supplies needed: CHG body wash. Clean washcloth. Clean towel. How to use CHG in the shower Follow these steps unless you're told to use CHG in a different way: Start the shower. Use your normal soap and shampoo to wash your face and hair. Turn off the shower or move out of the shower stream. Pour CHG onto a clean washcloth. Do not use any type of brush or rough sponge. Start at your neck, washing your body down to your toes. Make sure you: Wash the part of your body where the surgery will be done for at least 1 minute. Do not scrub. Do not use CHG on your head or face unless your health care provider tells you to. If it gets into your ears or eyes, rinse them well with water . Do not wash your genitals with CHG. Wash your back and under your arms. Make sure to wash skin folds. Let the CHG sit on your skin for 1-2 minutes or as long as told. Rinse your entire body in the shower, including all body creases and folds. Turn off the shower. Dry off with a clean towel. Do not put anything on your skin afterward, such as powder, lotion, or perfume. Put on clean clothes or pajamas. If it's the night before surgery, sleep in clean sheets. General tips Use CHG only as told, and follow the instructions on the label. Use the full amount of CHG as told. This is often one bottle. Do not smoke and stay away from flames after using CHG. Your skin may feel sticky after using CHG. This is normal. The sticky feeling will go away as the CHG dries. Do not use CHG: If you have a chlorhexidine  allergy or have reacted to chlorhexidine  in the past. On open wounds or areas of skin that have broken skin, cuts, or scrapes. On babies younger than 44 months of age. Contact a health care provider if: You have questions about using CHG. Your  skin gets irritated or itchy. You have a rash after using  CHG. You swallow any CHG. Call your local poison control center [phone] in the [place] Your eyes itch badly, or they become very red or swollen. Your hearing changes. You have trouble seeing. If you can't reach your provider, go to an urgent care or emergency room. Do not drive yourself. Get help right away if: You have swelling or tingling in your mouth or throat. You make high-pitched whistling sounds when you breathe, most often when you breathe out (wheeze). You have trouble breathing. These symptoms may be an emergency. Call 911 right away. Do not wait to see if the symptoms will go away. Do not drive yourself to the hospital. This information is not intended to replace advice given to you by your health care provider. Make sure you discuss any questions you have with your health care provider. Document Revised: 11/05/2022 Document Reviewed: 11/01/2021 Elsevier Patient Education  2024 [employer] Inc.How to Use an Incentive Spirometer An incentive spirometer is a tool that measures how well you are filling your lungs with each breath. Learning to take long, deep breaths using this tool can help you keep your lungs clear and active. This may help to reverse or lessen your chance of developing breathing (pulmonary) problems, especially infection. You may be asked to use a spirometer: After a surgery. If you have a lung problem or a history of smoking. After a long period of time when you have been unable to move or be active. If the spirometer includes an indicator to show the highest number that you have reached, your health care provider or respiratory therapist will help you set a goal. Keep a log of your progress as told by your health care provider. What are the risks? Breathing too quickly may cause dizziness or cause you to pass out. Take your time so you do not get dizzy or light-headed. If you are in pain, you may need to take pain medicine before doing incentive spirometry. It  is harder to take a deep breath if you are having pain. How to use your incentive spirometer  Sit up on the edge of your bed or on a chair. Hold the incentive spirometer so that it is in an upright position. Before you use the spirometer, breathe out normally. Place the mouthpiece in your mouth. Make sure your lips are closed tightly around it. Breathe in slowly and as deeply as you can through your mouth, causing the piston or the ball to rise toward the top of the chamber. Hold your breath for 3-5 seconds, or for as long as possible. If the spirometer includes a coach indicator, use this to guide you in breathing. Slow down your breathing if the indicator goes above the marked areas. Remove the mouthpiece from your mouth and breathe out normally. The piston or ball will return to the bottom of the chamber. Rest for a few seconds, then repeat the steps 10 or more times. Take your time and take a few normal breaths between deep breaths so that you do not get dizzy or light-headed. Do this every 1-2 hours when you are awake. If the spirometer includes a goal marker to show the highest number you have reached (best effort), use this as a goal to work toward during each repetition. After each set of 10 deep breaths, cough a few times. This will help to make sure that your lungs are clear. If you have an incision on your  chest or abdomen from surgery, place a pillow or a rolled-up towel firmly against the incision when you cough. This can help to reduce pain while taking deep breaths and coughing. General tips When you are able to get out of bed: Walk around often. Continue to take deep breaths and cough in order to clear your lungs. Keep using the incentive spirometer until your health care provider says it is okay to stop using it. If you have been in the hospital, you may be told to keep using the spirometer at home. Contact a health care provider if: You are having difficulty using the  spirometer. You have trouble using the spirometer as often as instructed. Your pain medicine is not giving enough relief for you to use the spirometer as told. You have a fever. Get help right away if: You develop shortness of breath. You develop a cough with bloody mucus from the lungs. You have fluid or blood coming from an incision site after you cough. Summary An incentive spirometer is a tool that can help you learn to take long, deep breaths to keep your lungs clear and active. You may be asked to use a spirometer after a surgery, if you have a lung problem or a history of smoking, or if you have been inactive for a long period of time. Use your incentive spirometer as instructed every 1-2 hours while you are awake. If you have an incision on your chest or abdomen, place a pillow or a rolled-up towel firmly against your incision when you cough. This will help to reduce pain. Get help right away if you have shortness of breath, you cough up bloody mucus, or blood comes from your incision when you cough. This information is not intended to replace advice given to you by your health care provider. Make sure you discuss any questions you have with your health care provider. Document Revised: 02/28/2023 Document Reviewed: 02/28/2023 Elsevier Patient Education  2024 [employer].

## 2023-11-06 ENCOUNTER — Ambulatory Visit (HOSPITAL_COMMUNITY)

## 2023-11-06 ENCOUNTER — Encounter (HOSPITAL_COMMUNITY)
Admission: RE | Disposition: A | Discharge status: Home / Self Care | Payer: Self-pay | Source: Home / Self Care | Attending: Surgery

## 2023-11-06 ENCOUNTER — Other Ambulatory Visit: Payer: Self-pay

## 2023-11-06 ENCOUNTER — Ambulatory Visit (HOSPITAL_COMMUNITY)
Admission: RE | Admit: 2023-11-06 | Discharge: 2023-11-06 | Disposition: A | Discharge status: Home / Self Care | Attending: Surgery | Admitting: Surgery

## 2023-11-06 ENCOUNTER — Encounter (HOSPITAL_COMMUNITY): Payer: Self-pay | Admitting: Surgery

## 2023-11-06 DIAGNOSIS — K7460 Unspecified cirrhosis of liver: Secondary | ICD-10-CM | POA: Diagnosis not present

## 2023-11-06 DIAGNOSIS — Z923 Personal history of irradiation: Secondary | ICD-10-CM | POA: Insufficient documentation

## 2023-11-06 DIAGNOSIS — Z9221 Personal history of antineoplastic chemotherapy: Secondary | ICD-10-CM | POA: Insufficient documentation

## 2023-11-06 DIAGNOSIS — I10 Essential (primary) hypertension: Secondary | ICD-10-CM

## 2023-11-06 DIAGNOSIS — L905 Scar conditions and fibrosis of skin: Secondary | ICD-10-CM | POA: Diagnosis not present

## 2023-11-06 DIAGNOSIS — E039 Hypothyroidism, unspecified: Secondary | ICD-10-CM | POA: Diagnosis not present

## 2023-11-06 DIAGNOSIS — N62 Hypertrophy of breast: Secondary | ICD-10-CM | POA: Diagnosis not present

## 2023-11-06 DIAGNOSIS — K219 Gastro-esophageal reflux disease without esophagitis: Secondary | ICD-10-CM | POA: Insufficient documentation

## 2023-11-06 DIAGNOSIS — C9101 Acute lymphoblastic leukemia, in remission: Secondary | ICD-10-CM | POA: Diagnosis not present

## 2023-11-06 DIAGNOSIS — I2510 Atherosclerotic heart disease of native coronary artery without angina pectoris: Secondary | ICD-10-CM

## 2023-11-06 DIAGNOSIS — Z85810 Personal history of malignant neoplasm of tongue: Secondary | ICD-10-CM | POA: Diagnosis not present

## 2023-11-06 DIAGNOSIS — Z85820 Personal history of malignant melanoma of skin: Secondary | ICD-10-CM | POA: Diagnosis not present

## 2023-11-06 DIAGNOSIS — N6489 Other specified disorders of breast: Secondary | ICD-10-CM | POA: Diagnosis not present

## 2023-11-06 DIAGNOSIS — Z79890 Hormone replacement therapy (postmenopausal): Secondary | ICD-10-CM | POA: Insufficient documentation

## 2023-11-06 DIAGNOSIS — J8410 Pulmonary fibrosis, unspecified: Secondary | ICD-10-CM | POA: Insufficient documentation

## 2023-11-06 DIAGNOSIS — Z85828 Personal history of other malignant neoplasm of skin: Secondary | ICD-10-CM | POA: Insufficient documentation

## 2023-11-06 DIAGNOSIS — N6322 Unspecified lump in the left breast, upper inner quadrant: Secondary | ICD-10-CM | POA: Diagnosis not present

## 2023-11-06 DIAGNOSIS — N6320 Unspecified lump in the left breast, unspecified quadrant: Secondary | ICD-10-CM | POA: Diagnosis not present

## 2023-11-06 HISTORY — PX: BREAST LUMPECTOMY WITH RADIO FREQUENCY LOCALIZER: SHX6897

## 2023-11-06 SURGERY — BREAST LUMPECTOMY WITH RADIO FREQUENCY LOCALIZER
Anesthesia: General | Site: Breast | Laterality: Left

## 2023-11-06 MED ORDER — MIDAZOLAM HCL 2 MG/2ML IJ SOLN
INTRAMUSCULAR | Status: AC
  Filled 2023-11-06: qty 2

## 2023-11-06 MED ORDER — FENTANYL CITRATE (PF) 100 MCG/2ML IJ SOLN
INTRAMUSCULAR | Status: AC
  Filled 2023-11-06: qty 2

## 2023-11-06 MED ORDER — BUPIVACAINE HCL (PF) 0.5 % IJ SOLN
INTRAMUSCULAR | Status: AC
  Filled 2023-11-06: qty 30

## 2023-11-06 MED ORDER — ACETAMINOPHEN 500 MG PO TABS: 1000.0000 mg | ORAL_TABLET | Freq: Four times a day (QID) | ORAL | 0 refills | Status: AC

## 2023-11-06 MED ORDER — MIDAZOLAM HCL 2 MG/2ML IJ SOLN
INTRAMUSCULAR | Status: DC | PRN
  Administered 2023-11-06: 2 mg via INTRAVENOUS

## 2023-11-06 MED ORDER — OXYCODONE HCL 5 MG PO TABS: 5.0000 mg | ORAL_TABLET | Freq: Four times a day (QID) | ORAL | 0 refills | Status: DC | PRN

## 2023-11-06 MED ORDER — PHENYLEPHRINE 80 MCG/ML (10ML) SYRINGE FOR IV PUSH (FOR BLOOD PRESSURE SUPPORT)
PREFILLED_SYRINGE | INTRAVENOUS | Status: AC
  Filled 2023-11-06: qty 10

## 2023-11-06 MED ORDER — ORAL CARE MOUTH RINSE: 15.0000 mL | Freq: Once | OROMUCOSAL | Status: DC

## 2023-11-06 MED ORDER — CHLORHEXIDINE GLUCONATE CLOTH 2 % EX PADS: 6.0000 | MEDICATED_PAD | Freq: Once | CUTANEOUS | Status: DC

## 2023-11-06 MED ORDER — ONDANSETRON HCL 4 MG/2ML IJ SOLN
INTRAMUSCULAR | Status: DC | PRN
  Administered 2023-11-06: 4 mg via INTRAVENOUS

## 2023-11-06 MED ORDER — DOCUSATE SODIUM 100 MG PO CAPS: 100.0000 mg | ORAL_CAPSULE | Freq: Two times a day (BID) | ORAL | 2 refills | Status: DC

## 2023-11-06 MED ORDER — EPHEDRINE SULFATE-NACL 50-0.9 MG/10ML-% IV SOSY
PREFILLED_SYRINGE | INTRAVENOUS | Status: DC | PRN
Start: 2023-11-06 — End: 2023-11-06
  Administered 2023-11-06 (×3): 5 mg via INTRAVENOUS

## 2023-11-06 MED ORDER — ROCURONIUM BROMIDE 10 MG/ML (PF) SYRINGE
PREFILLED_SYRINGE | INTRAVENOUS | Status: DC | PRN
  Administered 2023-11-06: 40 mg via INTRAVENOUS

## 2023-11-06 MED ORDER — DEXAMETHASONE SODIUM PHOSPHATE 10 MG/ML IJ SOLN
INTRAMUSCULAR | Status: AC
  Filled 2023-11-06: qty 1

## 2023-11-06 MED ORDER — FENTANYL CITRATE (PF) 100 MCG/2ML IJ SOLN
INTRAMUSCULAR | Status: DC | PRN
  Administered 2023-11-06 (×2): 50 ug via INTRAVENOUS
  Administered 2023-11-06 (×4): 25 ug via INTRAVENOUS

## 2023-11-06 MED ORDER — DEXAMETHASONE SODIUM PHOSPHATE 10 MG/ML IJ SOLN
INTRAMUSCULAR | Status: DC | PRN
  Administered 2023-11-06: 10 mg via INTRAVENOUS

## 2023-11-06 MED ORDER — PROPOFOL 500 MG/50ML IV EMUL
INTRAVENOUS | Status: DC | PRN
  Administered 2023-11-06: 150 mg via INTRAVENOUS

## 2023-11-06 MED ORDER — CHLORHEXIDINE GLUCONATE 0.12 % MT SOLN: 15.0000 mL | Freq: Once | OROMUCOSAL | Status: DC

## 2023-11-06 MED ORDER — ONDANSETRON HCL 4 MG PO TABS: 4.0000 mg | ORAL_TABLET | Freq: Every day | ORAL | 1 refills | Status: DC | PRN

## 2023-11-06 MED ORDER — BUPIVACAINE HCL (PF) 0.5 % IJ SOLN
INTRAMUSCULAR | Status: DC | PRN
  Administered 2023-11-06: 30 mL

## 2023-11-06 MED ORDER — PHENYLEPHRINE 80 MCG/ML (10ML) SYRINGE FOR IV PUSH (FOR BLOOD PRESSURE SUPPORT)
PREFILLED_SYRINGE | INTRAVENOUS | Status: DC | PRN
  Administered 2023-11-06: 40 ug via INTRAVENOUS
  Administered 2023-11-06: 80 ug via INTRAVENOUS
  Administered 2023-11-06 (×10): 40 ug via INTRAVENOUS

## 2023-11-06 MED ORDER — SUGAMMADEX SODIUM 200 MG/2ML IV SOLN
INTRAVENOUS | Status: DC | PRN
  Administered 2023-11-06: 150 mg via INTRAVENOUS

## 2023-11-06 MED ORDER — ONDANSETRON HCL 4 MG/2ML IJ SOLN
INTRAMUSCULAR | Status: AC
Start: 2023-11-06 — End: 2023-11-06
  Filled 2023-11-06: qty 2

## 2023-11-06 MED ORDER — CEFAZOLIN SODIUM-DEXTROSE 2-4 GM/100ML-% IV SOLN
2.0000 g | INTRAVENOUS | Status: AC
Start: 2023-11-06 — End: 2023-11-06
  Administered 2023-11-06: 2 g via INTRAVENOUS
  Filled 2023-11-06: qty 100

## 2023-11-06 MED ORDER — SODIUM CHLORIDE 0.9 % IR SOLN
Status: DC | PRN
  Administered 2023-11-06: 1000 mL

## 2023-11-06 MED ORDER — DEXMEDETOMIDINE HCL IN NACL 80 MCG/20ML IV SOLN
INTRAVENOUS | Status: AC
Start: 2023-11-06 — End: 2023-11-06
  Filled 2023-11-06: qty 20

## 2023-11-06 MED ORDER — PROPOFOL 10 MG/ML IV BOLUS
INTRAVENOUS | Status: AC
  Filled 2023-11-06: qty 20

## 2023-11-06 MED ORDER — DEXMEDETOMIDINE HCL IN NACL 80 MCG/20ML IV SOLN
INTRAVENOUS | Status: DC | PRN
  Administered 2023-11-06: 8 ug via INTRAVENOUS

## 2023-11-06 MED ORDER — HYDROCODONE-ACETAMINOPHEN 7.5-325 MG PO TABS: 1.0000 | ORAL_TABLET | Freq: Once | ORAL | Status: DC | PRN

## 2023-11-06 MED ORDER — ONDANSETRON HCL 4 MG/2ML IJ SOLN: 4.0000 mg | Freq: Once | INTRAMUSCULAR | Status: DC | PRN

## 2023-11-06 MED ORDER — LACTATED RINGERS IV SOLN
INTRAVENOUS | Status: DC
Start: 2023-11-06 — End: 2023-11-06

## 2023-11-06 MED ORDER — LIDOCAINE 2% (20 MG/ML) 5 ML SYRINGE
INTRAMUSCULAR | Status: DC | PRN
  Administered 2023-11-06: 60 mg via INTRAVENOUS

## 2023-11-06 MED ORDER — ACETAMINOPHEN 500 MG PO TABS: 1000.0000 mg | ORAL_TABLET | Freq: Four times a day (QID) | ORAL | 0 refills | Status: DC

## 2023-11-06 MED ORDER — FENTANYL CITRATE PF 50 MCG/ML IJ SOSY: 25.0000 ug | PREFILLED_SYRINGE | INTRAMUSCULAR | Status: DC | PRN

## 2023-11-06 MED ORDER — SODIUM CHLORIDE (PF) 0.9 % IJ SOLN
INTRAMUSCULAR | Status: AC
Start: 2023-11-06 — End: 2023-11-06
  Filled 2023-11-06: qty 10

## 2023-11-06 SURGICAL SUPPLY — 30 items
BINDER BREAST LRG (GAUZE/BANDAGES/DRESSINGS) IMPLANT
BLADE SURG 15 STRL LF DISP TIS (BLADE) ×1 IMPLANT
CHLORAPREP W/TINT 26 (MISCELLANEOUS) ×1 IMPLANT
CLOTH BEACON ORANGE TIMEOUT ST (SAFETY) ×1 IMPLANT
COVER LIGHT HANDLE (MISCELLANEOUS) IMPLANT
DERMABOND ADVANCED .7 DNX12 (GAUZE/BANDAGES/DRESSINGS) IMPLANT
DEVICE DUBIN W/COMP PLATE 8390 (MISCELLANEOUS) IMPLANT
ELECTRODE REM PT RTRN 9FT ADLT (ELECTROSURGICAL) ×1 IMPLANT
GLOVE BIOGEL PI IND STRL 6.5 (GLOVE) ×1 IMPLANT
GLOVE BIOGEL PI IND STRL 7.0 (GLOVE) ×2 IMPLANT
GLOVE SURG SS PI 6.5 STRL IVOR (GLOVE) ×3 IMPLANT
GOWN STRL REUS W/TWL LRG LVL3 (GOWN DISPOSABLE) ×3 IMPLANT
KIT MARKER MARGIN INK (KITS) ×1 IMPLANT
KIT TURNOVER KIT A (KITS) ×1 IMPLANT
MANIFOLD NEPTUNE II (INSTRUMENTS) ×1 IMPLANT
NDL HYPO 18GX1.5 BLUNT FILL (NEEDLE) ×1 IMPLANT
NDL HYPO 25X1 1.5 SAFETY (NEEDLE) ×1 IMPLANT
NEEDLE HYPO 18GX1.5 BLUNT FILL (NEEDLE) ×1 IMPLANT
NEEDLE HYPO 25X1 1.5 SAFETY (NEEDLE) ×1 IMPLANT
NS IRRIG 1000ML POUR BTL (IV SOLUTION) ×1 IMPLANT
PACK MINOR (CUSTOM PROCEDURE TRAY) ×1 IMPLANT
PAD ARMBOARD POSITIONER FOAM (MISCELLANEOUS) ×1 IMPLANT
POSITIONER HEAD 8X9X4 ADT (SOFTGOODS) ×1 IMPLANT
SET BASIN LINEN APH (SET/KITS/TRAYS/PACK) ×1 IMPLANT
SET LOCALIZER 20 PROBE US (MISCELLANEOUS) ×1 IMPLANT
SUT MNCRL AB 4-0 PS2 18 (SUTURE) IMPLANT
SUT SILK 2 0 SH (SUTURE) ×1 IMPLANT
SUT VIC AB 3-0 SH 27X BRD (SUTURE) ×1 IMPLANT
SUT VIC AB 4-0 PS2 27 (SUTURE) ×1 IMPLANT
SYR CONTROL 10ML LL (SYRINGE) ×1 IMPLANT

## 2023-11-06 NOTE — Transfer of Care (Signed)
 Immediate Anesthesia Transfer of Care Note  Patient: Latoya Bautista  Procedure(s) Performed: BREAST LUMPECTOMY WITH RADIO FREQUENCY LOCALIZER (Left: Breast)  Patient Location: PACU  Anesthesia Type:General  Level of Consciousness: awake  Airway & Oxygen Therapy: Patient Spontanous Breathing and Patient connected to face mask oxygen  Post-op Assessment: Report given to RN and Post -op Vital signs reviewed and stable  Post vital signs: Reviewed and stable  Last Vitals:  Vitals Value Taken Time  BP 127/65   Temp 97.8   Pulse 80 11/06/23 09:47  Resp 16 11/06/23 09:47  SpO2 100 % 11/06/23 09:47  Vitals shown include unfiled device data.  Last Pain:  Vitals:   11/06/23 0635  TempSrc: Oral  PainSc: 0-No pain      Patients Stated Pain Goal: 7 (11/06/23 9364)  Complications: No notable events documented.

## 2023-11-06 NOTE — H&P (Signed)
 [facility] History and Physical   Reason for Referral: Abnormal mammogram Referring Physician: Breast center   Chief Complaint   New Patient (Initial Visit)        Latoya Bautista is a 53 y.o. female.  HPI: Patient presents for evaluation of an abnormal left breast finding on mammogram.  She has been undergoing screening mammograms for multiple years, and this was her first abnormal mammogram. The patient has no history of any masses, lumps, bumps, nipple changes or discharge. She never menstruated, secondary to damage from total body radiation when she was a child for leukemia.  She has no children and has no ability to become pregnant.  She has no history of any family breast cancer.  She is currently taking estrogen hormone replacement.  She has never had any previous biopsies or concerning areas on mammogram.  She has a history of leukemia diagnosed when she was 53 years old and recurred when she was 53 years old.  She underwent 2 bone marrow transplants and received total body radiation.  She also has a personal history of melanoma of her labia which was excised, left leg skin cancer which was excised, and squamous cell carcinoma of the tongue status post partial glossectomy 3 years ago.  Her other medical history significant procedures, GERD, hypertension, hyperlipidemia, pulmonary fibrosis for which she follows with pulmonology, and cirrhosis of the liver.  Her surgical history is significant for laparoscopic cholecystectomy in addition to the above listed surgeries.  She denies use of blood thinning medications.  She denies use of tobacco products, alcohol, and illicit drugs.         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    dx age 53;   chemo;  whole body radiation;  x2 stem cell transplants @ [facility] 1982 & 1984   History of antineoplastic chemotherapy      child   History of basal cell carcinoma (BCC) excision 10/2016    left leg   History of hepatitis C 2012    pt contracted hep c from blood transfuions's as child in setting of ALL;   08/ 2012 liver bx advanced liver disease mild portal hyertensive gastropathy G2S3, completed 44 wks telaprevir trple bases therapy 12/ 2012 ;  cured and  had  egd 2016 no evidence [facility]   History of kidney stones 2011   History of melanoma in situ 07/2018    vulva bx   History of radiation therapy      whole body radiation in 1980s for ALL   Hyperlipidemia, mixed     Hypersensitivity pneumonitis (HCC)     Hypertension     Hypothyroidism      endocrinologist-- dr charlena lesches   ILD (interstitial lung disease) [facility])      pulmonologist-- dr myrtis gay   Intermittent palpitations  cardiologist--- dr j. branch;   monitor 03/ 2019  PACs/ PVCs   Osteoporosis     Primary amenorrhea     Seasonal allergies     Seizure disorder [facility]) 1982    neurologist--- dr ronal cleverly;  since age 41 with first stem cell transplant in setting ALL   Squamous cell carcinoma of lateral tongue [facility]) 2021    oncologist--- dr heber  ENT-- Dr jessee;    recurrent dysplasia bx's since 2005 until 07/ 2021 small resection and larger excisional bx 07/ 2022 with positive margins;   01-18-2021 right partial glossectomy (less than half)   Thrombocytopenia [facility])                 Past Surgical History:  Procedure Laterality Date   BONE MARROW BIOPSY   05/07/2011   BONE MARROW TRANSPLANT       BREAST BIOPSY Left 09/22/2023    MM LT BREAST BX W LOC DEV 1ST LESION  IMAGE BX SPEC STEREO GUIDE 09/22/2023 [facility]   BUNIONECTOMY       CATARACT EXTRACTION W/PHACO Right 04/25/2023    Procedure: CATARACT EXTRACTION PHACO AND INTRAOCULAR LENS PLACEMENT (IOC);  Surgeon: Harrie Agent, MD;  Location: [facility];  Service: Ophthalmology;  Laterality: Right;  CDE 1.97   CATARACT EXTRACTION W/PHACO Left 05/09/2023    Procedure: CATARACT EXTRACTION PHACO AND INTRAOCULAR LENS PLACEMENT (IOC);  Surgeon: Harrie Agent, MD;  Location: [facility];  Service: Ophthalmology;  Laterality: Left;  CDE 3.26   CHOLECYSTECTOMY, LAPAROSCOPIC   1994   ESOPHAGOGASTRODUODENOSCOPY   09/03/2016   EXOSTECTECTOMY TOE Right 09/04/2022    Procedure: EXOSTECTECTOMY TOE;  Surgeon: Gershon Donnice SAUNDERS, DPM;  Location: [facility] ;  Service: Podiatry;  Laterality: Right;   FEMUR IM NAIL Right 05/06/2022    Procedure: INTRAMEDULLARY (IM) NAIL FEMORAL;  Surgeon: Kendal Franky SQUIBB, MD;  Location: [facility];  Service: Orthopedics;  Laterality: Right;   MOHS SURGERY   2023    spring   PARTIAL GLOSSECTOMY   01/18/2021    @ [facility];  less than half , right lateral               Family History  Problem Relation Age of Onset   Hypertension Father     Diabetes Maternal Uncle            Social History  Social History         Tobacco Use   Smoking status: Never      Passive exposure: Never   Smokeless tobacco: Never  Vaping Use   Vaping status: Never Used  Substance Use Topics   Alcohol use: No   Drug use: Never        Medications: I have reviewed the patient's current medications. Allergies as of 10/02/2023         Reactions    Simvastatin Rash    Briviact [brivaracetam]      Elevated LFT's    Asparaginase Derivatives Rash    Elspar [asparaginase] Rash    Keppra  [levetiracetam ] Rash    Soap Rash, Other (See Comments)    Procedure prep-soap (not Betadine , either- per the patient)    Tape Rash, Other (See Comments)    Redness from the tape            Medication  List           Accurate as of Oct 02, 2023 11:59 PM. If you have any questions, ask your nurse or doctor.  CENTRUM PO Take 1 tablet by mouth daily.    cholecalciferol 1000 units tablet Commonly known as: VITAMIN D  Take 1,000 Units by mouth daily.    CoQ-10 100 MG Caps Take 1 capsule by mouth daily.    estradiol  1 MG tablet Commonly known as: Estrace  Take 1 tablet (1 mg total) by mouth daily.    famotidine  20 MG tablet Commonly known as: PEPCID  Take 1 tablet (20 mg total) by mouth 2 (two) times daily. What changed: when to take this    fenofibrate  54 MG tablet Take 54 mg by mouth daily.    folic acid  1 MG tablet Commonly known as: FOLVITE  take 1 tablet once daily.    Garlic 1000 MG Caps Take 1 capsule by mouth daily.    glucosamine-chondroitin 500-400 MG tablet Take 1 tablet by mouth 2 (two) times daily.    lamoTRIgine  50 MG 24 hour tablet Commonly known as: LAMICTAL  XR Take 3 tablets (150 mg total) by mouth 2 (two) times daily.    levothyroxine  125 MCG tablet Commonly known as: Synthroid  Take 0.5 tablets (62.5 mcg total) by mouth daily.    Lycopene 10 MG Caps Take by mouth.    MAGNESIUM PO Take 1 tablet by mouth daily.    Metoprolol  Tartrate 75 MG Tabs Take 1 tablet (75 mg total) by mouth 2 (two) times daily.    OMEGA-3 FISH OIL PO Take 1 capsule by mouth daily.    pantoprazole  40 MG tablet Commonly known as: PROTONIX  Take 1 tablet (40 mg total) by mouth daily. What changed: when to take this    Probiotic Daily Caps Take 1 capsule by mouth daily.    progesterone  100 MG capsule Commonly known as: PROMETRIUM  Take 1 capsule (100 mg total) by mouth daily for 15 days per month.    Quercetin 500 MG Caps Take 1 capsule by mouth daily.    Reclast  5 MG/100ML Soln injection Generic drug: zoledronic  acid Inject 5 mg into the vein once.    rosuvastatin  40 MG tablet Commonly known as: Crestor  Take 1 tablet (40 mg total) by mouth at  bedtime.    sodium bicarbonate  325 MG tablet Take 325 mg by mouth 2 (two) times daily.    vitamin C 1000 MG tablet Take 1,000 mg by mouth daily.    Vitamin D  (Ergocalciferol ) 1.25 MG (50000 UNIT) Caps capsule Commonly known as: DRISDOL  Take 1 capsule (50,000 Units total) by mouth every OTHER week as directed What changed:  when to take this additional instructions    VITAMIN E PO Take 1 capsule by mouth daily.    Vitamin K2 100 MCG Caps Take 1 capsule by mouth daily.               ROS:  Constitutional: negative for chills, fatigue, and fevers Eyes: negative for visual disturbance and pain Ears, nose, mouth, throat, and face: positive for sinus problems, negative for ear drainage and sore throat Respiratory: positive for shortness of breath, negative for cough and wheezing Cardiovascular: negative for chest pain and palpitations Gastrointestinal: positive for reflux symptoms, negative for abdominal pain, nausea, and vomiting Genitourinary:negative for dysuria and frequency Integument/breast: positive for dryness, negative for rash Hematologic/lymphatic: negative for bleeding and lymphadenopathy Musculoskeletal:negative for back pain and neck pain Neurological: negative for dizziness and tremors Endocrine: negative for temperature intolerance   Vitals:   11/06/23 0635  BP: 124/64  Pulse: 64  Resp: 15  Temp: 97.9 F (36.6 C)  SpO2: 100%   Physical  Exam Vitals reviewed.  Constitutional:      Appearance: Normal appearance.  HENT:     Head: Normocephalic and atraumatic.  Eyes:     Extraocular Movements: Extraocular movements intact.     Pupils: Pupils are equal, round, and reactive to light.  Cardiovascular:     Rate and Rhythm: Normal rate and regular rhythm.  Pulmonary:     Effort: Pulmonary effort is normal.     Breath sounds: Normal breath sounds.  Chest:  Breasts:    Right: Normal. No swelling, bleeding, inverted nipple, mass, nipple discharge, skin  change or tenderness.     Left: Normal. No swelling, bleeding, inverted nipple, mass, nipple discharge, skin change or tenderness.     Comments: No palpable abnormalities in the right or left breast Abdominal:     General: There is no distension.     Palpations: Abdomen is soft.     Tenderness: There is no abdominal tenderness.  Musculoskeletal:        General: Normal range of motion.     Cervical back: Normal range of motion.  Lymphadenopathy:     Upper Body:     Right upper body: No supraclavicular or axillary adenopathy.     Left upper body: No supraclavicular or axillary adenopathy.  Skin:    General: Skin is warm and dry.  Neurological:     General: No focal deficit present.     Mental Status: She is alert and oriented to person, place, and time.  Psychiatric:        Mood and Affect: Mood normal.        Behavior: Behavior normal.        Results: Bilateral screening mammogram (08/21/2023): IMPRESSION: Further evaluation is suggested for a possible mass in the left breast.   RECOMMENDATION: Diagnostic mammogram and possibly ultrasound of the left breast. (Code:FI-L-80M)   The patient will be contacted regarding the findings, and additional imaging will be scheduled.   BI-RADS CATEGORY  0: Incomplete: Need additional imaging evaluation.   Left breast diagnostic mammogram (09/11/2023): IMPRESSION: Indeterminate 0.5 cm mass in the central upper posterior left breast.   RECOMMENDATION: Recommend stereotactic guided core biopsy of the mass in the left breast.   I have discussed the findings and recommendations with the patient. If applicable, a reminder letter will be sent to the patient regarding the next appointment.   BI-RADS CATEGORY  4: Suspicious.   Left breast ultrasound (09/11/2023): IMPRESSION: Indeterminate 0.5 cm mass in the central upper posterior left breast.   RECOMMENDATION: Recommend stereotactic guided core biopsy of the mass in the  left breast.   I have discussed the findings and recommendations with the patient. If applicable, a reminder letter will be sent to the patient regarding the next appointment.   BI-RADS CATEGORY  4: Suspicious.   Left breast stereotactic core needle biopsy (09/22/2023): IMPRESSION: Stereotactic-guided biopsy of the left breast. No apparent complications.   ADDENDUM REPORT: 09/23/2023 20:35   ADDENDUM: Pathology revealed FOCAL RADIAL SCAR/COMPLEX SCLEROSING LESION (3 MM) WITH ASSOCIATED FIBROCYSTIC CHANGE AND APOCRINE METAPLASIA WITH BACKGROUND BREAST CONSISTING PREDOMINANTLY OF ADIPOSE TISSUE WITH ONLY RARE DUCTULAR ELEMENTS of the LEFT breast, upper central, 12 o'clock, (ribbon clip). This was found to be concordant by Dr. Dina Arceo, with excision recommended.   Pathology results were discussed with the patient by telephone. The patient reported doing well after the biopsy with tenderness at the site. Post biopsy instructions and care were reviewed and questions were answered. The patient was  encouraged to call [facility] for any additional concerns. My direct phone number was provided.   Surgical consultation request was sent to [facility] in [place], [place] via secure EPIC message on Sep 23, 2023.      Assessment & Plan:  Latoya Bautista is a 53 y.o. female who presents with an abnormal mammogram.   -We discussed her pathology from her stereotactic core needle biopsy demonstrating radial scar and a complex sclerosing lesion.  While these lesions are not considered precancerous, they do increase her risk of either sided breast cancer and is recommended to be surgically excised to rule out any underlying cancer. -Given this area is not palpable, she will need to undergo radiofrequency tag placement by radiology -We also discussed that pending her final pathology, she may require further surgical intervention at the excision site  or by excision of lymph nodes. -The risk and benefits of left breast lumpectomy were discussed including but not limited to bleeding, infection, injury to surrounding structures, and need for additional procedures.  After careful consideration, Latoya Bautista has decided to proceed with surgery.  -Patient tentatively scheduled tag placement on 6/24 and for surgery on 7/3 -Information provided to the patient regarding lumpectomy   All questions were answered to the satisfaction of the patient.   Note: Portions of this report may have been transcribed using voice recognition software. Every effort has been made to ensure accuracy; however, inadvertent computerized transcription errors may still be present.    Dorothyann Brittle, DO [facility] [address] [place], [place] [address] [phone] (office)

## 2023-11-06 NOTE — Op Note (Signed)
 [facility] Operative Note  11/06/23  Preoperative Diagnosis: complex sclerosing lesion, radial scar   Postoperative Diagnosis: Same   Procedure(s) Performed: Left breast lumpectomy   Surgeon: Dorothyann Brittle, DO    Assistants: No qualified resident was available    Anesthesia: General endotracheal   Anesthesiologist: Herschell Hollering, MD    Specimens: Left breast mass-painted with orientation system, inferior margin x2 without paint, medial margin with yellow paint, superomedial margin with red and yellow paint, superolateral margin with black and orange paint, inferolateral margin with blue and orange paint; additional inferomedial margin not marked with paint   Estimated Blood Loss: Minimal   Blood Replacement: None    Complications: None   Wound Class: Clean   Operative Indications: Patient is a 53 year old female who presents for left breast lumpectomy.  She was noted to have a complex sclerosing lesion and radial scar after biopsy of an abnormal area seen on mammogram.  We discussed need for excisional biopsy of this area.  She is agreeable to left breast lumpectomy at this time.  All risks and benefits of performing this procedure were discussed with the patient including pain, infection, bleeding, damage to the surrounding structures, and need for more procedures or surgery. The patient voiced understanding of the procedure, all questions were sought and answered, and consent was obtained.  Findings: -Left breast lesion excised with radiofrequency localizing clip in the specimen, biopsy clip not in initial specimen. -Margins of cavity removed, biopsy clip noted in the unpainted inferomedial margin (other margins removed include 2 inferior margins- unpainted, medial margin- painted, superomedial margin- painted, superolateral margin- painted, and inferolateral margin- painted)   Procedure: The patient was brought to the operating room and the site of  surgery, left breast, was confirmed. General anesthesia was induced. Preoperative localization was performed by the department of radiology via a Faxitron clip.  The breast, chest wall, axilla, and upper arm and neck were prepped and draped in the usual sterile fashion.  A time-out was completed verifying correct patient, procedure, site, positioning, and implant(s) and/or special equipment prior to beginning this procedure.  The Faxitron probe was used to help determine the best location for incision, and a curvilinear incision was made and flaps were raised.  The Faxitron probe was placed within the depths of the wounds and upon dissection, the clip was identified.  A ball of tissue was taken surrounding this area.  The probe was then used to demonstrate that the clip was removed with the specimen.  The specimen was oriented using our painting system. This specimen was then sent to radiology to evaluated for radiofrequency clip and biopsy clip.  The radiofrequency clip was within the specimen, but the biopsy clip was not present.  Additional margins were removed from the cavity using electrocautery.  These margins included an unpainted inferomedial margin, painted medial margin, 2 unpainted inferior margins, a painted superomedial margin, a painted superolateral margin, and a painted inferolateral margin.  The biopsy clip was noted within the unpainted inferomedial margin.  All specimens were sent to pathology for evaluation.  Electrocautery was used to achieve hemostasis. 3-0 Vicryl was used to close the dermis, and 4-0 Monocryl used to close the skin. The incision was dressed with Dermabond.  A compression bra was placed on the patient.  Final inspection revealed acceptable hemostasis. All counts were correct at the end of the case. The patient was awakened from anesthesia and extubated without complication.  The patient went to the PACU in stable  condition.   Dorothyann Brittle, DO  [facility] [address] [place], [place] [address] [phone] (office)

## 2023-11-06 NOTE — Anesthesia Preprocedure Evaluation (Addendum)
 Anesthesia Evaluation  Patient identified by MRN, date of birth, ID band Patient awake    Reviewed: Allergy & Precautions, H&P , NPO status , Patient's Chart, lab work & pertinent test results  Airway Mallampati: II  TM Distance: >3 FB Neck ROM: Full    Dental no notable dental hx.    Pulmonary  Interstitial lung disease   Pulmonary exam normal breath sounds clear to auscultation       Cardiovascular hypertension, + CAD and + DOE  Normal cardiovascular exam+ Valvular Problems/Murmurs  Rhythm:Regular Rate:Normal  Nl EF CAD per CT scan   Neuro/Psych Seizures -,   negative psych ROS   GI/Hepatic ,GERD  ,,(+) Hepatitis -, C  Endo/Other  Hypothyroidism    Renal/GU Renal disease  negative genitourinary   Musculoskeletal negative musculoskeletal ROS (+)    Abdominal   Peds negative pediatric ROS (+)  Hematology  (+) Blood dyscrasia, anemia   Anesthesia Other Findings   Reproductive/Obstetrics negative OB ROS                              Anesthesia Physical Anesthesia Plan  ASA: 3  Anesthesia Plan: General   Post-op Pain Management:    Induction: Intravenous and Cricoid pressure planned  PONV Risk Score and Plan:   Airway Management Planned: Oral ETT  Additional Equipment:   Intra-op Plan:   Post-operative Plan: Extubation in OR  Informed Consent: I have reviewed the patients History and Physical, chart, labs and discussed the procedure including the risks, benefits and alternatives for the proposed anesthesia with the patient or authorized representative who has indicated his/her understanding and acceptance.     Dental advisory given  Plan Discussed with: CRNA  Anesthesia Plan Comments:         Anesthesia Quick Evaluation

## 2023-11-06 NOTE — Anesthesia Procedure Notes (Signed)
 Procedure Name: Intubation Date/Time: 11/06/2023 7:45 AM  Performed by: Herschell Hollering, MDPre-anesthesia Checklist: Patient identified, Emergency Drugs available, Suction available and Patient being monitored Patient Re-evaluated:Patient Re-evaluated prior to induction Oxygen Delivery Method: Circle system utilized Preoxygenation: Pre-oxygenation with 100% oxygen Induction Type: IV induction Ventilation: Mask ventilation without difficulty Laryngoscope Size: Mac and 3 Grade View: Grade I Tube type: Oral Tube size: 7.0 mm Number of attempts: 1 Airway Equipment and Method: Stylet Placement Confirmation: ETT inserted through vocal cords under direct vision, positive ETCO2, CO2 detector and breath sounds checked- equal and bilateral Secured at: 21 cm Tube secured with: Tape Dental Injury: Teeth and Oropharynx as per pre-operative assessment

## 2023-11-06 NOTE — Progress Notes (Signed)
 [facility]  Spoke with the patient's husband in the consultation room.  I explained that she tolerated the procedure without difficulty.  She has dissolvable stitches under the skin with overlying skin glue.  This will flake off in 10 to 14 days.  I discharged her home with a prescription for narcotic pain medication that they should take as needed for pain.  I also want her taking scheduled Tylenol.  If they take the narcotic pain medication, they should take a stool softener as well.  The patient will follow-up with me in 2 weeks.  All questions were answered to his expressed satisfaction.  Theophilus Kinds, DO [facility] [address] [place], [place] [address] [phone] (office)

## 2023-11-06 NOTE — Anesthesia Postprocedure Evaluation (Signed)
 Anesthesia Post Note  Patient: Latoya Bautista  Procedure(s) Performed: BREAST LUMPECTOMY WITH RADIO FREQUENCY LOCALIZER (Left: Breast)  Patient location during evaluation: PACU Anesthesia Type: General Level of consciousness: awake and alert Pain management: pain level controlled Vital Signs Assessment: post-procedure vital signs reviewed and stable Respiratory status: spontaneous breathing, nonlabored ventilation, respiratory function stable and patient connected to nasal cannula oxygen Cardiovascular status: blood pressure returned to baseline and stable Postop Assessment: no apparent nausea or vomiting Anesthetic complications: no   No notable events documented.   Last Vitals:  Vitals:   11/06/23 1015 11/06/23 1016  BP: (!) 147/75   Pulse: 84 89  Resp: 19 18  Temp:    SpO2: 99% 98%    Last Pain:  Vitals:   11/06/23 1016  TempSrc:   PainSc: 0-No pain                 Andrea Limes

## 2023-11-06 NOTE — Discharge Instructions (Signed)
 Ambulatory Surgery Discharge Instructions  General Anesthesia or Sedation Do not drive or operate heavy machinery for 24 hours.  Do not consume alcohol, tranquilizers, sleeping medications, or any non-prescribed medications for 24 hours. Do not make important decisions or sign any important papers in the next 24 hours. You should have someone with you tonight at home.  Activity  You are advised to go directly home from the hospital.  Restrict your activities and rest for a day.  Resume light activity tomorrow. No heavy lifting over 10 lbs or strenuous exercise. Wear a compression bra while up and moving and at night for the first week after surgery.  Fluids and Diet Begin with clear liquids, bouillon, dry toast, soda crackers.  If not nauseated, you may go to a regular diet when you desire.  Greasy and spicy foods are not advised.  Medications  If you have not had a bowel movement in 24 hours, take 2 tablespoons over the counter Milk of mag.             You May resume your blood thinners tomorrow (Aspirin, coumadin, or other).  You are being discharged with prescriptions for Opioid/Narcotic Medications: There are some specific considerations for these medications that you should know. Opioid Meds have risks & benefits. Addiction to these meds is always a concern with prolonged use Take medication only as directed Do not drive while taking narcotic pain medication Do not crush tablets or capsules Do not use a different container than medication was dispensed in Lock the container of medication in a cool, dry place out of reach of children and pets. Opioid medication can cause addiction Do not share with anyone else (this is a felony) Do not store medications for future use. Dispose of them properly.     Disposal:  Find a [facility]  household drug take back site near you.  If you can't get to a drug take back site, use the recipe below as a last resort to dispose of expired, unused or  unwanted drugs. Disposal  (Do not dispose chemotherapy drugs this way, talk to your prescribing doctor instead.) Step 1: Mix drugs (do not crush) with dirt, kitty litter, or used coffee grounds and add a small amount of water  to dissolve any solid medications. Step 2: Seal drugs in plastic bag. Step 3: Place plastic bag in trash. Step 4: Take prescription container and scratch out personal information, then recycle or throw away.  Operative Site  You have a liquid bandage over your incisions, this will begin to flake off in about a week. Ok to English as a second language teacher. Keep wound clean and dry. No baths or swimming. No lifting more than 10 pounds.  Contact Information: If you have questions or concerns, please call our office, [phone], Monday- Thursday 8AM-5PM and Friday 8AM-12Noon.  If it is after hours or on the weekend, please call [facility] Main Number, [phone], and ask to speak to the surgeon on call for Dr. Evonnie at [facility].   SPECIFIC COMPLICATIONS TO WATCH FOR: Inability to urinate Fever over 101? F by mouth Nausea and vomiting lasting longer than 24 hours. Pain not relieved by medication ordered Swelling around the operative site Increased redness, warmth, hardness, around operative area Numbness, tingling, or cold fingers or toes Blood -soaked dressing, (small amounts of oozing may be normal) Increasing and progressive drainage from surgical area or exam site

## 2023-11-07 ENCOUNTER — Encounter (HOSPITAL_COMMUNITY): Payer: Self-pay | Admitting: Surgery

## 2023-11-10 LAB — SURGICAL PATHOLOGY

## 2023-11-17 ENCOUNTER — Telehealth (INDEPENDENT_AMBULATORY_CARE_PROVIDER_SITE_OTHER): Admitting: Surgery

## 2023-11-17 DIAGNOSIS — Z09 Encounter for follow-up examination after completed treatment for conditions other than malignant neoplasm: Secondary | ICD-10-CM

## 2023-11-17 NOTE — Telephone Encounter (Signed)
 [facility]  Called to update the patient regarding her pathology results.  I explained that she has no evidence of malignancy or atypia on the pathology, and currently does not require any further surgery.  She has no significant complaints related to the surgical site.  She has not required any pain medications.  She is scheduled to follow up with me this Wednesday.  All questions were answered to her expressed satisfaction.  Pathology: A. LEFT BREAST MASS:  Small focus of ducts with usual ductal hyperplasia.  Biopsy site and biopsy clip.  Negative for residual complex sclerosing lesion.  Negative for atypia and malignancy.  (   B. LEFT BREAST, MARGIN EXCISION:  Benign breast tissue, predominantly benign adipose tissue.  Negative for atypia and malignancy.   C. LEFT BREAST, INFERIOR MEDIAL MARGIN:  Benign adipose tissue.  Negative for atypia and malignancy.   Dorothyann Brittle, DO [facility] [address] [place], [place] [address] [phone] (office)

## 2023-11-19 ENCOUNTER — Inpatient Hospital Stay: Payer: Medicaid Other

## 2023-11-19 ENCOUNTER — Ambulatory Visit: Admitting: Surgery

## 2023-11-19 ENCOUNTER — Encounter: Payer: Self-pay | Admitting: Surgery

## 2023-11-19 VITALS — BP 129/74 | HR 76 | Temp 98.8°F | Resp 16 | Ht 59.0 in | Wt 132.0 lb

## 2023-11-19 DIAGNOSIS — Z09 Encounter for follow-up examination after completed treatment for conditions other than malignant neoplasm: Secondary | ICD-10-CM

## 2023-11-19 NOTE — Progress Notes (Unsigned)
 [facility] Note   HPI:  52 y.o. Female presents to clinic for post-op follow-up status post left breast lumpectomy on 7/3.  Patient has overall been doing well since the surgery.  She denies any pain and she is not taking anything for pain currently.  She is tolerating a diet without nausea and vomiting.  Denies fevers and chills.  Denies issues at her incision site.  Review of Systems:  All other review of systems: otherwise negative   Vital Signs:  BP 129/74   Pulse 76   Temp 98.8 F (37.1 C) (Oral)   Resp 16   Ht 4' 11 (1.499 m)   Wt 132 lb (59.9 kg)   SpO2 96%   BMI 26.66 kg/m    Physical Exam:  Physical Exam Vitals reviewed.  Constitutional:      Appearance: Normal appearance.  Chest:     Comments: Left breast incision site healing well with improving surrounding ecchymosis, and skin glue in place Neurological:     Mental Status: She is alert.     Laboratory studies: None  Imaging:  None  Pathology: A. LEFT BREAST MASS:  Small focus of ducts with usual ductal hyperplasia.  Biopsy site and biopsy clip.  Negative for residual complex sclerosing lesion.  Negative for atypia and malignancy.  (   B. LEFT BREAST, MARGIN EXCISION:  Benign breast tissue, predominantly benign adipose tissue.  Negative for atypia and malignancy.   C. LEFT BREAST, INFERIOR MEDIAL MARGIN:  Benign adipose tissue.  Negative for atypia and malignancy.    Assessment:  53 y.o. yo Female who presents for follow-up status post left breast lumpectomy on 7/3  Plan:  - We discussed the pathology of her left breast specimen.  I explained that there is no evidence of any malignancy or atypia, so she may resume her normal annual screening mammograms - Overall patient doing well since her surgery.  Pain well-controlled - Advised that she can use antibiotic ointment at her incision sites prior to showering to help loosen and remove the remaining skin glue - Discussed that she  is 2 weeks out from surgery, so she can slowly increase her activity back to her normal activity she was doing prior to surgery.  Advised if and activity causes pain in her left breast, she should stop that activity - Follow up as needed  All of the above recommendations were discussed with the patient, and all of patient's questions were answered to her expressed satisfaction.  Note: Portions of this report may have been transcribed using voice recognition software. Every effort has been made to ensure accuracy; however, inadvertent computerized transcription errors may still be present.   Dorothyann Brittle, DO [facility] [name] [place], [place] [address] [phone] (office)

## 2023-11-24 ENCOUNTER — Ambulatory Visit (INDEPENDENT_AMBULATORY_CARE_PROVIDER_SITE_OTHER): Admitting: Dermatology

## 2023-11-24 ENCOUNTER — Encounter: Payer: Self-pay | Admitting: Dermatology

## 2023-11-24 VITALS — BP 134/74 | HR 79

## 2023-11-24 DIAGNOSIS — Z1283 Encounter for screening for malignant neoplasm of skin: Secondary | ICD-10-CM

## 2023-11-24 DIAGNOSIS — L578 Other skin changes due to chronic exposure to nonionizing radiation: Secondary | ICD-10-CM | POA: Diagnosis not present

## 2023-11-24 DIAGNOSIS — Z86006 Personal history of melanoma in-situ: Secondary | ICD-10-CM | POA: Diagnosis not present

## 2023-11-24 DIAGNOSIS — Z85828 Personal history of other malignant neoplasm of skin: Secondary | ICD-10-CM

## 2023-11-24 DIAGNOSIS — D229 Melanocytic nevi, unspecified: Secondary | ICD-10-CM

## 2023-11-24 DIAGNOSIS — L821 Other seborrheic keratosis: Secondary | ICD-10-CM | POA: Diagnosis not present

## 2023-11-24 DIAGNOSIS — L820 Inflamed seborrheic keratosis: Secondary | ICD-10-CM

## 2023-11-24 DIAGNOSIS — D492 Neoplasm of unspecified behavior of bone, soft tissue, and skin: Secondary | ICD-10-CM | POA: Diagnosis not present

## 2023-11-24 DIAGNOSIS — W908XXA Exposure to other nonionizing radiation, initial encounter: Secondary | ICD-10-CM | POA: Diagnosis not present

## 2023-11-24 DIAGNOSIS — D1801 Hemangioma of skin and subcutaneous tissue: Secondary | ICD-10-CM | POA: Diagnosis not present

## 2023-11-24 DIAGNOSIS — D485 Neoplasm of uncertain behavior of skin: Secondary | ICD-10-CM

## 2023-11-24 DIAGNOSIS — L814 Other melanin hyperpigmentation: Secondary | ICD-10-CM

## 2023-11-24 NOTE — Progress Notes (Signed)
 New Patient Visit   Subjective  Latoya Bautista is a 53 y.o. female who presents for the following: Skin Cancer Screening and Full Body Skin Exam  The patient presents for Total-Body Skin Exam (TBSE) for skin cancer screening and mole check. The patient has spots, moles and lesions to be evaluated, some may be new or changing.  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   Hx of NMSC on left lower leg.    The following portions of the chart were reviewed this encounter and updated as appropriate: medications, allergies, medical history  Review of Systems:  No other skin or systemic complaints except as noted in HPI or Assessment and Plan.  Objective  Well appearing patient in no apparent distress; mood and affect are within normal limits.  A full examination was performed including scalp, head, eyes, ears, nose, lips, neck, chest, axillae, abdomen, back, buttocks, bilateral upper extremities, bilateral lower extremities, hands, feet, fingers, toes, fingernails, and toenails. Genital exam performed today. All findings within normal limits unless otherwise noted below.  No cervical, axillary or inguinal lymphadenopathy.  Relevant physical exam findings are noted in the Assessment and Plan.  Left Upper Arm - Posterior 5mm brown stuck on papule   Assessment & Plan   SKIN CANCER SCREENING PERFORMED TODAY.  ACTINIC DAMAGE - Chronic condition, secondary to cumulative UV/sun exposure - diffuse scaly erythematous macules with underlying dyspigmentation - Recommend daily broad spectrum sunscreen SPF 30+ to sun-exposed areas, reapply every 2 hours as needed.  - Staying in the shade or wearing long sleeves, sun glasses (UVA+UVB protection) and wide brim hats (4-inch brim around the entire circumference of the hat) are also recommended for sun protection.  - Call for new or changing lesions.  MELANOCYTIC NEVI - Tan-brown and/or pink-flesh-colored symmetric macules and  papules - Benign appearing on exam today - Observation - Call clinic for new or changing moles - Recommend daily use of broad spectrum spf 30+ sunscreen to sun-exposed areas.   LENTIGINES Exam: scattered tan macules Due to sun exposure Treatment Plan: Benign-appearing, observe. Recommend daily broad spectrum sunscreen SPF 30+ to sun-exposed areas, reapply every 2 hours as needed.  Call for any changes   HEMANGIOMA Exam: red papule(s) Discussed benign nature. Recommend observation. Call for changes.   SEBORRHEIC KERATOSIS - Stuck-on, waxy, tan-brown papules and/or plaques  - Benign-appearing - Discussed benign etiology and prognosis. - Observe - Call for any changes  HISTORY OF SQUAMOUS CELL CARCINOMA OF THE SKIN - No evidence of recurrence today - No lymphadenopathy - Recommend regular full body skin exams - Recommend daily broad spectrum sunscreen SPF 30+ to sun-exposed areas, reapply every 2 hours as needed.  - Call if any new or changing lesions are noted between office visits   HISTORY OF BASAL CELL CARCINOMA OF THE SKIN left lower leg - No evidence of recurrence today - Recommend regular full body skin exams - Recommend daily broad spectrum sunscreen SPF 30+ to sun-exposed areas, reapply every 2 hours as needed.  - Call if any new or changing lesions are noted between office visits    HISTORY OF MELANOMA IN SITU vagina - No evidence of recurrence today - Recommend regular full body skin exams - Recommend daily broad spectrum sunscreen SPF 30+ to sun-exposed areas, reapply every 2 hours as needed.  - Call if any new or changing lesions are noted between office visits  NEOPLASM OF UNCERTAIN BEHAVIOR OF SKIN Left Upper Arm -  Posterior Skin / nail biopsy Type of biopsy: tangential   Informed consent: discussed and consent obtained   Timeout: patient name, date of birth, surgical site, and procedure verified   Procedure prep:  Patient was prepped and draped in usual  sterile fashion Prep type:  Chlorhexidine  Anesthesia: the lesion was anesthetized in a standard fashion   Anesthetic:  1% lidocaine  w/ epinephrine  1-100,000 buffered w/ 8.4% NaHCO3 Instrument used: DermaBlade   Hemostasis achieved with: suture, pressure and electrodesiccation   Outcome: patient tolerated procedure well   Post-procedure details: sterile dressing applied and wound care instructions given   Dressing type: bandage and pressure dressing    Specimen 1 - Surgical pathology Differential Diagnosis: SK vs nevus  Check Margins: No ACTINIC SKIN DAMAGE   LENTIGINES   MULTIPLE BENIGN NEVI   SEBORRHEIC KERATOSIS   CHERRY ANGIOMA   HISTORY OF SCC (SQUAMOUS CELL CARCINOMA) OF SKIN   HISTORY OF BASAL CELL CARCINOMA (BCC)    Return in about 9 months (around 08/24/2024) for TBSE.  I, Darice Smock, CMA, am acting as scribe for RUFUS CHRISTELLA HOLY, MD.   Documentation: I have reviewed the above documentation for accuracy and completeness, and I agree with the above.  RUFUS CHRISTELLA HOLY, MD

## 2023-11-24 NOTE — Patient Instructions (Addendum)

## 2023-11-26 ENCOUNTER — Inpatient Hospital Stay: Payer: Medicaid Other | Admitting: Hematology

## 2023-11-26 LAB — SURGICAL PATHOLOGY

## 2023-11-27 ENCOUNTER — Ambulatory Visit: Payer: Self-pay | Admitting: Dermatology

## 2023-12-08 ENCOUNTER — Inpatient Hospital Stay: Attending: Oncology | Admitting: Oncology

## 2023-12-08 VITALS — BP 135/59 | HR 67 | Temp 97.6°F | Resp 16 | Wt 133.6 lb

## 2023-12-08 DIAGNOSIS — C021 Malignant neoplasm of border of tongue: Secondary | ICD-10-CM

## 2023-12-08 DIAGNOSIS — N6082 Other benign mammary dysplasias of left breast: Secondary | ICD-10-CM | POA: Diagnosis present

## 2023-12-08 NOTE — Assessment & Plan Note (Addendum)
 Patient has Focal complex sclerosing lesion with associated apocrine metaplasia of left breast S/p lumpectomy Pathology with no evidence of cancer  - No role of radiation with benign pathology - Recommend continuing yearly mammograms.  Next due 09/22/2024  Return to clinic in 1 year for follow up

## 2023-12-08 NOTE — Progress Notes (Signed)
   12/08/23 1400  Spiritual Encounters  Type of Visit Initial  Care provided to: Patient  Conversation partners present during encounter Other (comment)  Referral source Patient request  Reason for visit Routine spiritual support  OnCall Visit No  Spiritual Framework  Presenting Themes Impactful experiences and emotions;Significant life change;Values and beliefs  Community/Connection Friend(s) (Well connected to Cancer [facility])  Patient Stress Factors Not reviewed  Family Stress Factors Not reviewed  Interventions  Spiritual Care Interventions Made Established relationship of care and support;Reflective listening;Encouragement  Intervention Outcomes  Outcomes Connection to spiritual care  Spiritual Care Plan  Spiritual Care Issues Still Outstanding Chaplain will continue to follow   Reason for Visit: Chaplain met Pt in the hallway as she was talking with the receptionist.  Description of Visit: Meeting Latoya Bautista in the hallway she and I connected easily and she shared with me her story of surviving childhood leukemia.  She has also survived a number of other medical conditions in her lifetime as well.  Latoya Bautista has a quick wit and an easy smile.  She is well known in the [facility], not just because of her treatment here, but also because she worked in housekeeping here for 4 years.  I was not able to conduct much assessment with [name] during our brief conversation, but her quick connection to me indicates to me that she is open to support from spiritual care.  I will continue to follow up.  Plan of Care: Spiritual Care will continue to follow and support Latoya Bautista, MDiv  Chaplain, [facility] [email or web address] [phone]

## 2023-12-08 NOTE — Assessment & Plan Note (Addendum)
 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 Status post right partial glossectomy on 01/18/2021 at [facility] by Dr. Jesus Morones.  No palpable neck lymphadenopathy at this time.  No abnormal lesions on tongue.  - Continue to follow-up with ENT

## 2023-12-08 NOTE — Progress Notes (Signed)
 Patient Care Team: Shona Norleen PEDLAR, MD as PCP - General (Internal Medicine) Alvan, Dorn FALCON, MD as PCP - Cardiology (Cardiology) Celestia Joesph SQUIBB, RN as Oncology Nurse Navigator (Oncology) Rogers Hai, MD as Medical Oncologist (Oncology) Theophilus Roosevelt, MD as Consulting Physician (Pulmonary Disease)  Clinic Day:  12/08/2023  Referring physician: Shona Norleen PEDLAR, MD   CHIEF COMPLAINT:  CC: Apocrine metaplasia of breast    ASSESSMENT & PLAN:   Assessment & Plan: Latoya Bautista  is a 53 y.o. female with apocrine metaplasia of breast   Assessment & Plan Apocrine metaplasia of breast, left Patient has Focal complex sclerosing lesion with associated apocrine metaplasia of left breast S/p lumpectomy Pathology with no evidence of cancer  - No role of radiation with benign pathology - Recommend continuing yearly mammograms.  Next due 09/22/2024  Return to clinic in 1 year for follow up Squamous cell carcinoma of lateral tongue [facility]) Patient reports that she has been having tongue biopsy for multiple areas of dysplasia on her right tongue in 2005, 2007, 2012.  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 Status post right partial glossectomy on 01/18/2021 at [facility] by Dr. Kimble.  No palpable neck lymphadenopathy at this time.  No abnormal lesions on tongue.  - Continue to follow-up with ENT    The patient understands the plans discussed today and is in agreement with them.  She knows to contact our office if she develops concerns prior to her next appointment.  I provided 20 minutes of face-to-face time during this encounter and > 50% was spent counseling as documented under my assessment and plan.    Mickiel Dry, MD  [facility] [place] - A DEPT OF [facility] [address] [place] [address] Dept: [phone] Dept Fax: [phone]   Orders Placed This  Encounter  Procedures   MM 3D SCREENING MAMMOGRAM BILATERAL BREAST    Standing Status:   Future    Expected Date:   09/03/2024    Expiration Date:   12/07/2024    Reason for Exam (SYMPTOM  OR DIAGNOSIS REQUIRED):   Screening mammogram    Is the patient pregnant?:   No    Preferred imaging location?:   [facility]     ONCOLOGY HISTORY:   Oncology History  Apocrine metaplasia of breast, left  09/11/2023 Mammogram   IMPRESSION:  Indeterminate 0.5 cm mass in the central upper posterior left breast.   09/22/2023 Pathology Results   FINAL DIAGNOSIS   1. Breast, left, needle core biopsy, upper central :       -  FOCAL RADIAL SCAR/COMPLEX SCLEROSING LESION (3 MM) WITH ASSOCIATED FIBROCYSTIC CHANGE AND APOCRINE METAPLASIA WITH BACKGROUND BREAST CONSISTING PREDOMINANTLY OF ADIPOSE TISSUE WITH ONLY RARE DUCTULAR ELEMENTS.    10/10/2023 Initial Diagnosis   Apocrine metaplasia of breast, left   11/06/2023 Pathology Results   FINAL MICROSCOPIC DIAGNOSIS:   A. LEFT BREAST MASS:  Small focus of ducts with usual ductal hyperplasia.  Biopsy site and biopsy clip.  Negative for residual complex sclerosing lesion.  Negative for atypia and malignancy.    B. LEFT BREAST, MARGIN EXCISION:  Benign breast tissue, predominantly benign adipose tissue.  Negative for atypia and malignancy.   C. LEFT BREAST, INFERIOR MEDIAL MARGIN:  Benign adipose tissue.  Negative for atypia and malignancy.        Current Treatment:  Surveillance  INTERVAL HISTORY:  Latoya Bautista is here today for follow up.  She has no complaints today.  Her lumpectomy went well and the scar is healing well.  She recently had a Seborrheic keratosis removed too.  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  Patient also had whole body radiation once  prior to her first bone marrow transplant.  Patient reports no complaints today, is of very good health.   She is a non-smoker, nonalcoholic.  Does not have a family history of breast cancer.  I have reviewed the past medical history, past surgical history, social history and family history with the patient and they are unchanged from previous note.  ALLERGIES:  is allergic to simvastatin, briviact [brivaracetam], asparaginase derivatives, elspar [asparaginase], keppra  [levetiracetam ], soap, and tape.  MEDICATIONS:  Current Outpatient Medications  Medication Sig Dispense Refill   Ascorbic Acid (VITAMIN C) 1000 MG tablet Take 1,000 mg by mouth daily.     cholecalciferol (VITAMIN D ) 1000 units tablet Take 1,000 Units by mouth daily.     clobetasol ointment (TEMOVATE) 0.05 % Apply topically.     Coenzyme Q10 (COQ-10) 100 MG CAPS Take 1 capsule by mouth daily.     docusate sodium  (COLACE) 100 MG capsule Take 1 capsule (100 mg total) by mouth 2 (two) times daily. 60 capsule 2   estradiol  (ESTRACE ) 1 MG tablet Take 1 tablet (1 mg total) by mouth daily. 30 tablet 11   famotidine  (PEPCID ) 20 MG tablet Take 1 tablet (20 mg total) by mouth 2 (two) times daily. 60 tablet 2   fenofibrate  54 MG tablet Take 54 mg by mouth daily.     folic acid  (FOLVITE ) 1 MG tablet take 1 tablet once daily. 30 tablet 5   Garlic 1000 MG CAPS Take 1 capsule by mouth daily.     glucosamine-chondroitin 500-400 MG tablet Take 1 tablet by mouth 2 (two) times daily.     lamoTRIgine  (LAMICTAL  XR) 50 MG 24 hour tablet Take 3 tablets (150 mg total) by mouth 2 (two) times daily. 540 tablet 3   levothyroxine  (SYNTHROID ) 125 MCG tablet Take 0.5 tablets (62.5 mcg total) by mouth daily.     MAGNESIUM PO Take 1 tablet by mouth daily.     Menaquinone-7 (VITAMIN K2) 100 MCG CAPS Take 1 capsule by mouth daily.     Metoprolol  Tartrate 75 MG TABS Take 1 tablet (75 mg total) by mouth 2 (two) times daily. 60 tablet 11   Multiple  Vitamins-Minerals (CENTRUM PO) Take 1 tablet by mouth daily.       Omega-3 Fatty Acids (OMEGA-3 FISH OIL PO) Take 1 capsule by mouth daily.     ondansetron  (ZOFRAN ) 4 MG tablet Take 1 tablet (4 mg total) by mouth daily as needed for nausea or vomiting. 30 tablet 1   oxyCODONE  (ROXICODONE ) 5 MG immediate release tablet Take 1 tablet (5 mg total) by mouth every 6 (six) hours as needed. 8 tablet 0   pantoprazole  (PROTONIX ) 40 MG tablet Take 1 tablet (40 mg total) by mouth daily. 90 tablet 1   Probiotic Product (PROBIOTIC DAILY) CAPS Take 1 capsule by mouth daily.     progesterone  (PROMETRIUM ) 100 MG capsule Take 1 capsule (100 mg total) by mouth daily for 15 days per month. 45 capsule 5   Quercetin 500 MG CAPS Take 1 capsule  by mouth daily.     rosuvastatin  (CRESTOR ) 40 MG tablet Take 1 tablet (40 mg total) by mouth at bedtime. 90 tablet 3   sodium bicarbonate  325 MG tablet Take 325 mg by mouth 2 (two) times daily.     Vitamin D , Ergocalciferol , (DRISDOL ) 1.25 MG (50000 UNIT) CAPS capsule Take 1 capsule (50,000 Units total) by mouth every OTHER week as directed (Patient taking differently: Take 50,000 Units by mouth See admin instructions. Take 1 capsule (50,000 units) by mouth every other week.) 20 capsule 1   VITAMIN E PO Take 1 capsule by mouth daily.     zoledronic  acid (RECLAST ) 5 MG/100ML SOLN injection Inject 5 mg into the vein once.     No current facility-administered medications for this visit.    REVIEW OF SYSTEMS:   Constitutional: Denies fevers, chills or abnormal weight loss Eyes: Denies blurriness of vision Ears, nose, mouth, throat, and face: Denies mucositis or sore throat Respiratory: Denies cough, dyspnea or wheezes Cardiovascular: Denies palpitation, chest discomfort or lower extremity swelling Gastrointestinal:  Denies nausea, heartburn or change in bowel habits Skin: Denies abnormal skin rashes Lymphatics: Denies new lymphadenopathy or easy bruising Neurological:Denies  numbness, tingling or new weaknesses Behavioral/Psych: Mood is stable, no new changes  All other systems were reviewed with the patient and are negative.   VITALS:  Blood pressure (!) 135/59, pulse 67, temperature 97.6 F (36.4 C), temperature source Oral, resp. rate 16, weight 133 lb 9.6 oz (60.6 kg), SpO2 100%.  Wt Readings from Last 3 Encounters:  12/08/23 133 lb 9.6 oz (60.6 kg)  11/19/23 132 lb (59.9 kg)  11/06/23 133 lb (60.3 kg)    Body mass index is 26.98 kg/m.  Performance status (ECOG): 0 - Asymptomatic  PHYSICAL EXAM:   GENERAL:alert, no distress and comfortable SKIN: skin color, texture, turgor are normal, no rashes or significant lesions HEENT: Left side of the tongue with scar from glossectomy  BREAST: Right breast: Normal, left breast: Lumpectomy scar on the lower outer lateral quadrant healing well, nontender, no discharge. LYMPH:  no palpable lymphadenopathy in the cervical, axillary or inguinal LUNGS: clear to auscultation and percussion with normal breathing effort HEART: regular rate & rhythm and no murmurs and no lower extremity edema ABDOMEN:abdomen soft, non-tender and normal bowel sounds Musculoskeletal:no cyanosis of digits and no clubbing  NEURO: alert & oriented x 3 with fluent speech  LABORATORY DATA:  I have reviewed the data as listed    Component Value Date/Time   NA 139 10/10/2023 1146   K 4.2 10/10/2023 1146   CL 103 10/10/2023 1146   CO2 23 10/10/2023 1146   GLUCOSE 102 (H) 10/10/2023 1146   BUN 14 10/10/2023 1146   CREATININE 0.95 10/10/2023 1146   CALCIUM  10.3 10/10/2023 1146   CALCIUM  8.2 (L) 12/19/2009 1013   PROT 7.7 10/10/2023 1146   ALBUMIN 4.6 10/10/2023 1146   AST 35 10/10/2023 1146   ALT 29 10/10/2023 1146   ALKPHOS 52 10/10/2023 1146   BILITOT 0.9 10/10/2023 1146   GFRNONAA >60 10/10/2023 1146   GFRAA >60 09/16/2019 1105    Lab Results  Component Value Date   WBC 8.7 10/10/2023   NEUTROABS 3.7 10/10/2023   HGB 14.3  10/10/2023   HCT 41.8 10/10/2023   MCV 99.8 10/10/2023   PLT 170 10/10/2023      Chemistry      Component Value Date/Time   NA 139 10/10/2023 1146   K 4.2 10/10/2023 1146   CL  103 10/10/2023 1146   CO2 23 10/10/2023 1146   BUN 14 10/10/2023 1146   CREATININE 0.95 10/10/2023 1146      Component Value Date/Time   CALCIUM  10.3 10/10/2023 1146   CALCIUM  8.2 (L) 12/19/2009 1013   ALKPHOS 52 10/10/2023 1146   AST 35 10/10/2023 1146   ALT 29 10/10/2023 1146   BILITOT 0.9 10/10/2023 1146       RADIOGRAPHIC STUDIES: I have personally reviewed the radiological images as listed and agreed with the findings in the report.  None new to review

## 2023-12-22 ENCOUNTER — Ambulatory Visit: Admitting: "Endocrinology

## 2023-12-22 ENCOUNTER — Encounter: Payer: Self-pay | Admitting: "Endocrinology

## 2023-12-22 ENCOUNTER — Ambulatory Visit (INDEPENDENT_AMBULATORY_CARE_PROVIDER_SITE_OTHER)

## 2023-12-22 ENCOUNTER — Ambulatory Visit: Admitting: Podiatry

## 2023-12-22 VITALS — BP 126/74 | HR 80 | Ht 59.0 in | Wt 132.2 lb

## 2023-12-22 DIAGNOSIS — E782 Mixed hyperlipidemia: Secondary | ICD-10-CM | POA: Insufficient documentation

## 2023-12-22 DIAGNOSIS — I10 Essential (primary) hypertension: Secondary | ICD-10-CM | POA: Insufficient documentation

## 2023-12-22 DIAGNOSIS — E559 Vitamin D deficiency, unspecified: Secondary | ICD-10-CM | POA: Insufficient documentation

## 2023-12-22 DIAGNOSIS — M2141 Flat foot [pes planus] (acquired), right foot: Secondary | ICD-10-CM | POA: Diagnosis not present

## 2023-12-22 DIAGNOSIS — M818 Other osteoporosis without current pathological fracture: Secondary | ICD-10-CM | POA: Diagnosis not present

## 2023-12-22 DIAGNOSIS — E039 Hypothyroidism, unspecified: Secondary | ICD-10-CM | POA: Insufficient documentation

## 2023-12-22 DIAGNOSIS — M7751 Other enthesopathy of right foot: Secondary | ICD-10-CM

## 2023-12-22 DIAGNOSIS — M7750 Other enthesopathy of unspecified foot: Secondary | ICD-10-CM

## 2023-12-22 DIAGNOSIS — E2839 Other primary ovarian failure: Secondary | ICD-10-CM | POA: Insufficient documentation

## 2023-12-22 NOTE — Patient Instructions (Addendum)
   For inserts I like POWERSTEPS, SUPERFEET  You can use VOLTAREN  GEL on the area   Ice daily  Continue shoes with good arch support.

## 2023-12-22 NOTE — Progress Notes (Signed)
 Endocrinology Consult Note                                            12/22/2023, 2:32 PM   Subjective:    Patient ID: Latoya Bautista, female    DOB: 1970/11/08, PCP Shona Norleen PEDLAR, MD   Past Medical History:  Diagnosis Date   Anemia    Cirrhosis of liver without ascites (HCC)    gastro/ liver @ [facility]-- lindsey yaxheimer NP;  secondary to HCV,  well compensated   Coronary artery calcification seen on CAT scan    followed by cardiology;   CCT w/ FFR 01-22-2022  calcium  score=88.4, LAD / RCA   DOE (dyspnea on exertion)    GERD (gastroesophageal reflux disease)    H/O stem cell transplant (HCC)    1982 and 1984   Heart murmur    History of acute lymphoblastic leukemia (ALL) in remission 17   dx age 50;   chemo;  whole body radiation;  x2 stem cell transplants @ [facility] 1982 & 1984   History of antineoplastic chemotherapy    child   History of basal cell carcinoma (BCC) excision 10/2016   left leg   History of hepatitis C 2012   pt contracted hep c from blood transfuions's as child in setting of ALL;   08/ 2012 liver bx advanced liver disease mild portal hyertensive gastropathy G2S3, completed 44 wks telaprevir trple bases therapy 12/ 2012 ;  cured and  had  egd 2016 no evidence [facility]   History of kidney stones 2011   History of melanoma in situ 07/2018   vulva bx   History of radiation therapy    whole body radiation in 1980s for ALL   Hyperlipidemia, mixed    Hypersensitivity pneumonitis (HCC)    Hypertension    Hypothyroidism    endocrinologist-- dr charlena lesches   ILD (interstitial lung disease) [facility])    pulmonologist-- dr myrtis gay   Intermittent palpitations    cardiologist--- dr j. 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    oncologist--- dr heber  ENT-- Dr jessee;    recurrent dysplasia bx's since 2005 until 07/ 2021 small resection and larger excisional bx 07/ 2022 with positive margins;   01-18-2021 right partial glossectomy (less than half)   Thrombocytopenia [facility])    Past Surgical History:  Procedure Laterality Date   BONE MARROW BIOPSY  05/07/2011   BONE MARROW TRANSPLANT     BREAST BIOPSY Left 09/22/2023   MM LT BREAST BX W LOC DEV 1ST LESION IMAGE BX SPEC STEREO GUIDE 09/22/2023 [facility]   BREAST BIOPSY  10/28/2023   MM LT BREAST SAVI/RF TAG 1ST LESION MAMMO GUIDE 10/28/2023 [facility]   BREAST LUMPECTOMY WITH RADIO FREQUENCY LOCALIZER Left 11/06/2023   Procedure: BREAST LUMPECTOMY WITH RADIO FREQUENCY LOCALIZER;  Surgeon: Evonnie Dorothyann LABOR, DO;  Location: [facility];  Service: General;  Laterality: Left;   BUNIONECTOMY     CATARACT EXTRACTION W/PHACO Right 04/25/2023   Procedure: CATARACT EXTRACTION PHACO AND INTRAOCULAR LENS PLACEMENT (IOC);  Surgeon: Harrie Agent, MD;  Location: [facility];  Service: Ophthalmology;  Laterality: Right;  CDE 1.97   CATARACT EXTRACTION W/PHACO Left 05/09/2023   Procedure: CATARACT EXTRACTION PHACO AND INTRAOCULAR LENS PLACEMENT (IOC);  Surgeon: Harrie Agent, MD;  Location: [facility];  Service: Ophthalmology;  Laterality: Left;  CDE 3.26   CHOLECYSTECTOMY, LAPAROSCOPIC  1994   ESOPHAGOGASTRODUODENOSCOPY  09/03/2016   EXOSTECTECTOMY TOE Right 09/04/2022   Procedure: EXOSTECTECTOMY TOE;  Surgeon: Gershon Donnice SAUNDERS, DPM;  Location: [facility];  Service: Podiatry;  Laterality: Right;   FEMUR IM NAIL Right 05/06/2022   Procedure: INTRAMEDULLARY (IM) NAIL FEMORAL;  Surgeon: Kendal Franky SQUIBB, MD;  Location: [facility];  Service: Orthopedics;  Laterality: Right;   MOHS SURGERY  2023   spring   PARTIAL GLOSSECTOMY  01/18/2021   @ [facility];  less than half , right lateral   Social History   Socioeconomic History   Marital status: Married    Spouse  name: Alm   Number of children: 0   Years of education: 12th   Highest education level: Not on file  Occupational History    Employer: [employer]  Tobacco Use   Smoking status: Never    Passive exposure: Never   Smokeless tobacco: Never  Vaping Use   Vaping status: Never Used  Substance and Sexual Activity   Alcohol use: No   Drug use: Never   Sexual activity: Not Currently    Birth control/protection: None, Post-menopausal  Other Topics Concern   Not on file  Social History Narrative   Patient lives at home with her spouse.   Caffeine Use: 16oz bottle of soda daily   Social Drivers of Corporate investment banker Strain: Low Risk  (03/18/2023)   Overall Financial Resource Strain (CARDIA)    Difficulty of Paying Living Expenses: Not very hard  Food Insecurity: No Food Insecurity (11/18/2023)   [facility]   Hunger Vital Sign    Within the past 12 months, you worried that your food would run out before you got the money to buy more.: Never true    Within the past 12 months, the food you bought just didn't last and you didn't have money to get more.: Never true  Transportation Needs: No Transportation Needs (11/18/2023)   Received from [facility]   PRAPARE - Transportation    Lack of Transportation (Medical): No    Lack of Transportation (Non-Medical): No  Physical Activity: Sufficiently Active (03/18/2023)   Exercise Vital Sign    Days of Exercise per Week: 6 days    Minutes of Exercise per Session: 100 min  Stress: No Stress Concern Present (03/18/2023)   [employer] of Occupational Health - Occupational Stress Questionnaire    Feeling of Stress : Not at all  Social Connections: Socially Integrated (03/18/2023)   Social Connection and Isolation Panel    Frequency of Communication with Friends and Family: Twice a week    Frequency of Social Gatherings with Friends and Family: More than three times a week    Attends  Religious Services: More than 4 times per year    Active Member of [employer] or Organizations: Yes    Attends Engineer, structural: More than 4 times per year    Marital Status: Married   Family History  Problem Relation Age of Onset   Thyroid  disease Mother    Hypertension Father    Hyperlipidemia Father    Stomach cancer Maternal  Aunt        great aunt   Throat cancer Maternal Aunt    Diabetes Maternal Uncle    Throat cancer Maternal Uncle    Stomach cancer Maternal Uncle    Arthritis Maternal Great-grandmother    Outpatient Encounter Medications as of 12/22/2023  Medication Sig   Ascorbic Acid (VITAMIN C) 1000 MG tablet Take 1,000 mg by mouth daily.   cholecalciferol (VITAMIN D ) 1000 units tablet Take 1,000 Units by mouth daily.   clobetasol ointment (TEMOVATE) 0.05 % Apply topically.   Coenzyme Q10 (COQ-10) 100 MG CAPS Take 1 capsule by mouth daily.   estradiol  (ESTRACE ) 1 MG tablet Take 1 tablet (1 mg total) by mouth daily.   Garlic 1000 MG CAPS Take 1 capsule by mouth daily.   glucosamine-chondroitin 500-400 MG tablet Take 1 tablet by mouth 2 (two) times daily.   Lycopene 10 MG CAPS Take 1 capsule by mouth daily.   MAGNESIUM PO Take 1 tablet by mouth daily.   Menaquinone-7 (VITAMIN K2) 100 MCG CAPS Take 1 capsule by mouth daily.   Multiple Vitamins-Minerals (CENTRUM PO) Take 1 tablet by mouth daily.     Omega-3 Fatty Acids (OMEGA-3 FISH OIL PO) Take 1 capsule by mouth daily.   Quercetin 500 MG CAPS Take 1 capsule by mouth daily.   docusate sodium  (COLACE) 100 MG capsule Take 1 capsule (100 mg total) by mouth 2 (two) times daily. (Patient not taking: Reported on 12/22/2023)   famotidine  (PEPCID ) 20 MG tablet Take 1 tablet (20 mg total) by mouth 2 (two) times daily.   fenofibrate  54 MG tablet Take 54 mg by mouth daily.   folic acid  (FOLVITE ) 1 MG tablet take 1 tablet once daily.   lamoTRIgine  (LAMICTAL  XR) 50 MG 24 hour tablet Take 3 tablets (150 mg total) by mouth 2 (two)  times daily.   levothyroxine  (SYNTHROID ) 125 MCG tablet Take 0.5 tablets (62.5 mcg total) by mouth daily.   Metoprolol  Tartrate 75 MG TABS Take 1 tablet (75 mg total) by mouth 2 (two) times daily.   ondansetron  (ZOFRAN ) 4 MG tablet Take 1 tablet (4 mg total) by mouth daily as needed for nausea or vomiting. (Patient not taking: Reported on 12/22/2023)   oxyCODONE  (ROXICODONE ) 5 MG immediate release tablet Take 1 tablet (5 mg total) by mouth every 6 (six) hours as needed. (Patient not taking: Reported on 12/22/2023)   pantoprazole  (PROTONIX ) 40 MG tablet Take 1 tablet (40 mg total) by mouth daily.   Probiotic Product (PROBIOTIC DAILY) CAPS Take 1 capsule by mouth daily.   progesterone  (PROMETRIUM ) 100 MG capsule Take 1 capsule (100 mg total) by mouth daily for 15 days per month.   rosuvastatin  (CRESTOR ) 40 MG tablet Take 1 tablet (40 mg total) by mouth at bedtime.   sodium bicarbonate  325 MG tablet Take 325 mg by mouth 2 (two) times daily.   Vitamin D , Ergocalciferol , (DRISDOL ) 1.25 MG (50000 UNIT) CAPS capsule Take 1 capsule (50,000 Units total) by mouth every OTHER week as directed (Patient taking differently: Take 50,000 Units by mouth See admin instructions. Take 1 capsule (50,000 units) by mouth every other week.)   VITAMIN E PO Take 1 capsule by mouth daily.   zoledronic  acid (RECLAST ) 5 MG/100ML SOLN injection Inject 5 mg into the vein once.   No facility-administered encounter medications on file as of 12/22/2023.   ALLERGIES: Allergies  Allergen Reactions   Simvastatin Rash   Briviact [Brivaracetam]     Elevated LFT's  Asparaginase Derivatives Rash   Elspar [Asparaginase] Rash   Keppra  [Levetiracetam ] Rash   Soap Rash and Other (See Comments)    Procedure prep-soap (not Betadine , either- per the patient)   Tape Rash and Other (See Comments)    Redness from the tape    VACCINATION STATUS: Immunization History  Administered Date(s) Administered   Influenza, Seasonal, Injecte,  Preservative Fre 02/21/2011   Influenza,inj,Quad PF,6+ Mos 05/13/2012, 02/03/2021   Influenza,inj,quad, With Preservative 02/17/2017   Influenza-Unspecified 03/04/2014, 03/22/2022   PNEUMOCOCCAL CONJUGATE-20 01/16/2022   Zoster Recombinant(Shingrix) 01/16/2022    HPI TEAH VOTAW is 53 y.o. female who presents today with a medical history as above. she is being seen in consultation for her osteoporosis requested by Shona Norleen PEDLAR, MD.    History is obtained directly from the patient as well as chart review. Patient gives history of leukemia which required chemotherapy and radiation therapy as a girl at 80.  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  She was treated with estrogen and progesterone  combination at least from puberty.  She never had normal menstrual flow and cycle. At age 69, she was diagnosed with osteopenia for which she has received sequential treatment more or less with the following medications.  She took Fosamax for at least a decade followed by 1 year of Reclast  treatment.  This was followed by romosozumab  for 1 year and then Prolia  for only 3 doses before she was given Forteo  only for 1 year.  These truncated treatment courses are largely due to insurance access and change of her providers/practices.  She has received her last treatment with Reclast  in October 2024.    She has history of stress fractures of bilateral lower extremities (2021-2022) and right femoral fracture in December 2013.  Her most recent bone density is from February 2024 at [facility] which showed a T-score of -0.3 on the lumbar spine with 27.7% gain compared to her previous study from 2005.  She did have a T-score of -1.3 on left hip with a gain of 18.1% compared to last study and a T-score of -2.3 on femoral neck with a gain of 2% compared to the previous study.  She does not have acute complaints today.  She also has hypothyroidism for which she is on levothyroxine .  Her current medications include  vitamin D2 50,000 units every other week, estradiol  1 mg daily, progesterone  100 mg daily. Her other medical problems include hyperlipidemia and hypertension for which she is taking Crestor  40 mg p.o. nightly and fenofibrate  54 mg p.o. daily, metoprolol  75 mg p.o. twice daily. She is on several supplements including vitamin C, CoQ10, multivitamins, omega-3 fatty acids, lycopene, vitamin E, garlic, glucosamine-chondroitin, vitamin K 2, folic acid .  She does not have recent thyroid  function tests, TSH was 2.27 in July 2024. She denies any height loss.  She gives history of treatment with gross hormone at early years. She exercises regularly, does not smoke nor drink alcohol.  She does not follow any particular diet plan.  Review of Systems  Constitutional: +mildly fluctuating body weight ,  no fatigue, no subjective hyperthermia, no subjective hypothermia Eyes: no blurry vision, no xerophthalmia ENT: no sore throat, no nodules palpated in throat, no dysphagia/odynophagia, no hoarseness Cardiovascular: no Chest Pain, no Shortness of Breath, no palpitations, no leg swelling Respiratory: no cough, no shortness of breath Gastrointestinal: no Nausea/Vomiting/Diarhhea Musculoskeletal: no muscle/joint aches Skin: no rashes Neurological: no tremors, no numbness, no tingling, no dizziness Psychiatric: no depression, no anxiety  Objective:       12/22/2023    1:18 PM 12/08/2023    1:37 PM 11/24/2023    2:38 PM  Vitals with BMI  Height 4' 11    Weight 132 lbs 3 oz 133 lbs 10 oz   BMI 26.69 26.97   Systolic 126 135 865  Diastolic 74 59 74  Pulse 80 67 79    BP 126/74   Pulse 80   Ht 4' 11 (1.499 m)   Wt 132 lb 3.2 oz (60 kg)   BMI 26.70 kg/m   Wt Readings from Last 3 Encounters:  12/22/23 132 lb 3.2 oz (60 kg)  12/08/23 133 lb 9.6 oz (60.6 kg)  11/19/23 132 lb (59.9 kg)    Physical Exam  Constitutional:  Body mass index is 26.7 kg/m.,  not in acute distress, normal state of  mind Eyes: PERRLA, EOMI, no exophthalmos ENT: moist mucous membranes, no gross thyromegaly, no gross cervical lymphadenopathy Cardiovascular: normal precordial activity, Regular Rate and Rhythm, no Murmur/Rubs/Gallops Respiratory:  adequate breathing efforts, no gross chest deformity, Clear to auscultation bilaterally Gastrointestinal: abdomen soft, Non -tender, No distension, Bowel Sounds present, no gross organomegaly Musculoskeletal: no gross deformities, strength intact in all four extremities, no peripheral edema Skin: moist, warm, no rashes Neurological: no tremor with outstretched hands, Deep tendon reflexes normal in bilateral lower extremities.  CMP ( most recent) CMP     Component Value Date/Time   NA 139 10/10/2023 1146   K 4.2 10/10/2023 1146   CL 103 10/10/2023 1146   CO2 23 10/10/2023 1146   GLUCOSE 102 (H) 10/10/2023 1146   BUN 14 10/10/2023 1146   CREATININE 0.95 10/10/2023 1146   CALCIUM  10.3 10/10/2023 1146   CALCIUM  8.2 (L) 12/19/2009 1013   PROT 7.7 10/10/2023 1146   ALBUMIN 4.6 10/10/2023 1146   AST 35 10/10/2023 1146   ALT 29 10/10/2023 1146   ALKPHOS 52 10/10/2023 1146   BILITOT 0.9 10/10/2023 1146   GFRNONAA >60 10/10/2023 1146    Lipid Panel ( most recent) Lipid Panel     Component Value Date/Time   CHOL 125 01/23/2022 0804   TRIG 143 01/23/2022 0804   HDL 40 (L) 01/23/2022 0804   CHOLHDL 3.1 01/23/2022 0804   VLDL 29 01/23/2022 0804   LDLCALC 56 01/23/2022 0804      Lab Results  Component Value Date   TSH 2.273 11/12/2022   TSH 0.112 (L) 05/08/2022   TSH 2.065 11/07/2021   TSH 1.359 05/10/2021   TSH 1.532 07/28/2014      Assessment & Plan:   Osteoporosis 2.  Hypothyroidism 3.  Hypogonadism 4.  Vitamin D  deficiency 5.  Hyperlipidemia 6.  Hypertension  - SABEEN PIECHOCKI  is being seen at a kind request of Shona, Norleen PEDLAR, MD. - I have reviewed her available  records and clinically evaluated the patient. - Based on these reviews, she  has osteoporosis of likely multiple etiologies including prepubertal onset hypogonadism.  She likely never developed peak bone mass.    -- Even though she did not have intervening fragility fractures including stress fractures x 2 occasions between  2021 - 2022 on bilateral lower extremities and femoral fracture January 2024, she seems to have benefited from sequential treatment applications as detailed above.    Her right femoral fracture in December 2023 was while she was on Forteo .  It is not clear if this was an atypical femur fracture, alkaline phosphatase was 79 at that time.  No bone remodeling labs were obtained.  - She was treated with oral bisphosphonates for at least a decade until her mid 30s, romosozumab  for 1 year, Prolia  only for 3 doses before she was treated for a year with Forteo .   She is currently on Reclast , received only 1 treatment in October 2024.  She will continue to need antiosteoporosis treatment to lower or control her fracture risk. I approached her with options going forward.    I discussed potential risk factors of antiresorptive treatment including ONJ, atypical femur fractures, and treatment withdrawal associated bone loss.  Her 2 reasonable options include extension of Reclast  or switch to Prolia . She is not a candidate for drug holiday at this time.    Considering her recent satisfactory treatment response and her relative youth, I recommended extension of Reclast  treatment with periodic bone density assessment. She will have labs including N-telopeptide and CMP.  If labs are favorable, she will be considered for her second treatment with Reclast  which will be in October 2025 followed by follow-up bone density in February 2026.  -If her labs are not favorable, or if he develops another fracture, she will be switched to Prolia  for long-term. - She has a regular dental follow-up, no acute dental concern at this time. She is made aware of the fact that when she  starts Prolia , it would be a long-term commitment. She does seem to have insurance coverage issues-another reason to keep treatment affordable for her.  -She is advised on adequate protein intake, continued safe exercise programs.  She is advised to avoid vigorous exercise activities.  She will continue to benefit from her hormone replacement therapy and advised to maintain her follow-up with her OB/GYN providers.  Regarding her hypothyroidism: Etiology not clear.  She is advised to continue her levothyroxine  at current dose of 62.5 mcg p.o. daily.   - We discussed about the correct intake of her thyroid  hormone, on empty stomach at fasting, with water , separated by at least 30 minutes from breakfast and other medications,  and separated by more than 4 hours from calcium , iron, multivitamins, acid reflux medications (PPIs). -Patient is made aware of the fact that thyroid  hormone replacement is needed for life, dose to be adjusted by periodic monitoring of thyroid  function tests.  Her next labs will include thyroid  function test and antithyroid antibodies. She will also benefit from workup for adrenal sufficiency with a.m. cortisol and ACTH . She is currently on vitamin D  2 50,000 units every other week.  -She is advised to continue treatment for hyperlipidemia and hypertension.   - she is advised to maintain close follow up with Shona Norleen PEDLAR, MD for primary care needs.   -Thank you for involving me in the care of this pleasant patient.  Time spent with the patient: 64  minutes spent in  counseling her about osteoporosis, hypothyroidism, hypogonadism and the rest in obtaining information about her symptoms, reviewing her previous labs/studies (including abstractions from other facilities),  evaluations, and treatments,  and developing a plan to confirm diagnosis and long term treatment based on the latest standards of care/guidelines; and documenting her care.  Latoya Bautista participated in the  discussions, expressed understanding, and voiced agreement with the above plans.  All questions were answered to her satisfaction. she is encouraged to contact clinic should she have any questions or concerns prior to her return visit.  Follow up plan: Return in about 7 weeks (around 02/09/2024), or Reclast  in October 2025.   Ranny  Lenis, MD [facility] [address] [place], [place] [address] Phone: [phone]  Fax: [phone]     12/22/2023, 2:32 PM  This note was partially dictated with voice recognition software. Similar sounding words can be transcribed inadequately or may not  be corrected upon review.

## 2023-12-24 NOTE — Progress Notes (Signed)
 Subjective:   Patient ID: Latoya Bautista, female   DOB: 53 y.o.   MRN: [id number]   HPI Chief Complaint  Patient presents with   Foot Pain    Right foot pain pt stated that she has this knot on the inside of her right foot she stated that it was causing her a lot of discomfort on Friday    53 year old female presents the office with above concerns.  She said that she had a knot formed the inside aspect of her foot.  She said that she was in a lot of discomfort on Friday and has subsided some.  She does not recall any recent injuries.  She has changed shoes some and she does think that she would better arch support makes it feel better.   Review of Systems  All other systems reviewed and are negative.  Past Medical History:  Diagnosis Date   Anemia    Cirrhosis of liver without ascites (HCC)    gastro/ liver @ [facility]-- lindsey yaxheimer NP;  secondary to HCV,  well compensated   Coronary artery calcification seen on CAT scan    followed by cardiology;   CCT w/ FFR 01-22-2022  calcium  score=88.4, LAD / RCA   DOE (dyspnea on exertion)    GERD (gastroesophageal reflux disease)    H/O stem cell transplant (HCC)    1982 and 1984   Heart murmur    History of acute lymphoblastic leukemia (ALL) in remission 51   dx age 47;   chemo;  whole body radiation;  x2 stem cell transplants @ [facility] 1982 & 1984   History of antineoplastic chemotherapy    child   History of basal cell carcinoma (BCC) excision 10/2016   left leg   History of hepatitis C 2012   pt contracted hep c from blood transfuions's as child in setting of ALL;   08/ 2012 liver bx advanced liver disease mild portal hyertensive gastropathy G2S3, completed 44 wks telaprevir trple bases therapy 12/ 2012 ;  cured and  had  egd 2016 no evidence [facility]   History of kidney stones 2011   History of melanoma in situ 07/2018   vulva bx   History of radiation therapy    whole body radiation in 1980s for ALL   Hyperlipidemia, mixed     Hypersensitivity pneumonitis (HCC)    Hypertension    Hypothyroidism    endocrinologist-- dr charlena lesches   ILD (interstitial lung disease) [facility])    pulmonologist-- dr myrtis gay   Intermittent palpitations    cardiologist--- dr j. 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   oncologist--- dr heber  ENT-- Dr jessee;    recurrent dysplasia bx's since 2005 until 07/ 2021 small resection and larger excisional bx 07/ 2022 with positive margins;   01-18-2021 right partial glossectomy (less than half)   Thrombocytopenia [facility])     Past Surgical History:  Procedure Laterality Date   BONE MARROW BIOPSY  05/07/2011   BONE MARROW TRANSPLANT     BREAST BIOPSY Left 09/22/2023   MM LT BREAST BX W LOC DEV 1ST LESION IMAGE BX SPEC STEREO GUIDE 09/22/2023 [facility]   BREAST BIOPSY  10/28/2023  MM LT BREAST SAVI/RF TAG 1ST LESION MAMMO GUIDE 10/28/2023 [facility]   BREAST LUMPECTOMY WITH RADIO FREQUENCY LOCALIZER Left 11/06/2023   Procedure: BREAST LUMPECTOMY WITH RADIO FREQUENCY LOCALIZER;  Surgeon: Evonnie Dorothyann LABOR, DO;  Location: [facility];  Service: General;  Laterality: Left;   BUNIONECTOMY     CATARACT EXTRACTION W/PHACO Right 04/25/2023   Procedure: CATARACT EXTRACTION PHACO AND INTRAOCULAR LENS PLACEMENT (IOC);  Surgeon: Harrie Agent, MD;  Location: [facility];  Service: Ophthalmology;  Laterality: Right;  CDE 1.97   CATARACT EXTRACTION W/PHACO Left 05/09/2023   Procedure: CATARACT EXTRACTION PHACO AND INTRAOCULAR LENS PLACEMENT (IOC);  Surgeon: Harrie Agent, MD;  Location: [facility];  Service: Ophthalmology;  Laterality: Left;  CDE 3.26   CHOLECYSTECTOMY, LAPAROSCOPIC  1994   ESOPHAGOGASTRODUODENOSCOPY  09/03/2016   EXOSTECTECTOMY TOE Right  09/04/2022   Procedure: EXOSTECTECTOMY TOE;  Surgeon: Gershon Latoya SAUNDERS, DPM;  Location[facility];  Service: Podiatry;  Laterality: Right;   FEMUR IM NAIL Right 05/06/2022   Procedure: INTRAMEDULLARY (IM) NAIL FEMORAL;  Surgeon: Kendal Franky SQUIBB, MD;  Location: [facility];  Service: Orthopedics;  Laterality: Right;   MOHS SURGERY  2023   spring   PARTIAL GLOSSECTOMY  01/18/2021   @ [facility];  less than half , right lateral     Current Outpatient Medications:    Ascorbic Acid (VITAMIN C) 1000 MG tablet, Take 1,000 mg by mouth daily., Disp: , Rfl:    cholecalciferol (VITAMIN D ) 1000 units tablet, Take 1,000 Units by mouth daily., Disp: , Rfl:    clobetasol ointment (TEMOVATE) 0.05 %, Apply topically., Disp: , Rfl:    Coenzyme Q10 (COQ-10) 100 MG CAPS, Take 1 capsule by mouth daily., Disp: , Rfl:    docusate sodium  (COLACE) 100 MG capsule, Take 1 capsule (100 mg total) by mouth 2 (two) times daily. (Patient not taking: Reported on 12/22/2023), Disp: 60 capsule, Rfl: 2   estradiol  (ESTRACE ) 1 MG tablet, Take 1 tablet (1 mg total) by mouth daily., Disp: 30 tablet, Rfl: 11   famotidine  (PEPCID ) 20 MG tablet, Take 1 tablet (20 mg total) by mouth 2 (two) times daily., Disp: 60 tablet, Rfl: 2   fenofibrate  54 MG tablet, Take 54 mg by mouth daily., Disp: , Rfl:    folic acid  (FOLVITE ) 1 MG tablet, take 1 tablet once daily., Disp: 30 tablet, Rfl: 5   Garlic 1000 MG CAPS, Take 1 capsule by mouth daily., Disp: , Rfl:    glucosamine-chondroitin 500-400 MG tablet, Take 1 tablet by mouth 2 (two) times daily., Disp: , Rfl:    lamoTRIgine  (LAMICTAL  XR) 50 MG 24 hour tablet, Take 3 tablets (150 mg total) by mouth 2 (two) times daily., Disp: 540 tablet, Rfl: 3   levothyroxine  (SYNTHROID ) 125 MCG tablet, Take 0.5 tablets (62.5 mcg total) by mouth daily., Disp: , Rfl:    Lycopene 10 MG CAPS, Take 1 capsule by mouth daily., Disp: , Rfl:    MAGNESIUM PO, Take 1 tablet by mouth daily., Disp: , Rfl:     Menaquinone-7 (VITAMIN K2) 100 MCG CAPS, Take 1 capsule by mouth daily., Disp: , Rfl:    Metoprolol  Tartrate 75 MG TABS, Take 1 tablet (75 mg total) by mouth 2 (two) times daily., Disp: 60 tablet, Rfl: 11   Multiple Vitamins-Minerals (CENTRUM PO), Take 1 tablet by mouth daily.  , Disp: , Rfl:    Omega-3 Fatty Acids (OMEGA-3 FISH OIL PO), Take 1 capsule by mouth daily., Disp: , Rfl:  ondansetron  (ZOFRAN ) 4 MG tablet, Take 1 tablet (4 mg total) by mouth daily as needed for nausea or vomiting. (Patient not taking: Reported on 12/22/2023), Disp: 30 tablet, Rfl: 1   oxyCODONE  (ROXICODONE ) 5 MG immediate release tablet, Take 1 tablet (5 mg total) by mouth every 6 (six) hours as needed. (Patient not taking: Reported on 12/22/2023), Disp: 8 tablet, Rfl: 0   pantoprazole  (PROTONIX ) 40 MG tablet, Take 1 tablet (40 mg total) by mouth daily., Disp: 90 tablet, Rfl: 1   Probiotic Product (PROBIOTIC DAILY) CAPS, Take 1 capsule by mouth daily., Disp: , Rfl:    progesterone  (PROMETRIUM ) 100 MG capsule, Take 1 capsule (100 mg total) by mouth daily for 15 days per month., Disp: 45 capsule, Rfl: 5   Quercetin 500 MG CAPS, Take 1 capsule by mouth daily., Disp: , Rfl:    rosuvastatin  (CRESTOR ) 40 MG tablet, Take 1 tablet (40 mg total) by mouth at bedtime., Disp: 90 tablet, Rfl: 3   sodium bicarbonate  325 MG tablet, Take 325 mg by mouth 2 (two) times daily., Disp: , Rfl:    Vitamin D , Ergocalciferol , (DRISDOL ) 1.25 MG (50000 UNIT) CAPS capsule, Take 1 capsule (50,000 Units total) by mouth every OTHER week as directed (Patient taking differently: Take 50,000 Units by mouth See admin instructions. Take 1 capsule (50,000 units) by mouth every other week.), Disp: 20 capsule, Rfl: 1   VITAMIN E PO, Take 1 capsule by mouth daily., Disp: , Rfl:    zoledronic  acid (RECLAST ) 5 MG/100ML SOLN injection, Inject 5 mg into the vein once., Disp: , Rfl:   Allergies  Allergen Reactions   Simvastatin Rash   Briviact [Brivaracetam]      Elevated LFT's   Asparaginase Derivatives Rash   Elspar [Asparaginase] Rash   Keppra  [Levetiracetam ] Rash   Soap Rash and Other (See Comments)    Procedure prep-soap (not Betadine , either- per the patient)   Tape Rash and Other (See Comments)    Redness from the tape          Objective:  Physical Exam  General: AAO x3, NAD  Dermatological: Skin is warm, dry and supple bilateral. There are no open sores, no preulcerative lesions, no rash or signs of infection present.  Vascular: Dorsalis Pedis artery and Posterior Tibial artery pedal pulses are 2/4 bilateral with immedate capillary fill time. There is no pain with calf compression, swelling, warmth, erythema.   Neruologic: Grossly intact via light touch bilateral.   Musculoskeletal: Significant decreased medial arch upon weightbearing.  There is tenderness to palpation of the mild along the navicular tuberosity and there is prominence noted.  Is mild erythema from irritation there is no skin breakdown or warmth or signs of infection.  Flexor, extensor tendons clinically.  Be intact.  Gait: Unassisted, Nonantalgic.       Assessment:   Pes planovalgus with accessory navicular     Plan:  -Treatment options discussed including all alternatives, risks, and complications -Etiology of symptoms were discussed -X-rays were obtained and reviewed with the patient.  3 views of the foot were obtained.  There is no evidence of acute fracture.  Os naviculare present.  Pes planovalgus noted. -Overall symptoms have improved.  Discussed wearing shoes with better arch support.  Discussed adding additional arch supports inside of her shoes to help give that support that she needs.  Discussed offloading the area.  Voltaren  gel.  No follow-ups on file.  Latoya Bautista DPM

## 2024-01-01 NOTE — Telephone Encounter (Signed)
 8/28 @ 849 am  Pt stated that she had some questions for Manuelita Dickens, NP about her MRI results and next steps  Action:  Chart reviewed--> pt has sent her questions to [place] via My Chart

## 2024-01-02 ENCOUNTER — Encounter: Payer: Self-pay | Admitting: Obstetrics & Gynecology

## 2024-01-02 NOTE — Telephone Encounter (Signed)
 Surgical Date: 11/06/2023 Procedure: BREAST LUMPECTOMY WITH RADIO FREQUENCY LOCALIZER   Call placed to patient.   Patient reports that MRI Abdomen was completed at [facility] on 12/29/2023. Incidental finding of fluid collection in left breast noted: Small fluid collection with thin rim enhancement along the left inferior breast tissues measuring approximately 6.1 x 2.1 cm. Impression: New 6.1 cm fluid collection with thin rim of enhancement along the left inferior breast tissues which may represent postoperative fluid collection however recommend correlation with physical exam to exclude superimposed infection.   Patient denies S/Sx of infection. Denies fever/ chills, redness to area or warmth to touch, drainage, etc. Patient did state she has some mild to moderate pain from pressure in breast. States that she has not had any other concerns from prior breast surgery.   Appointment scheduled for evaluation.

## 2024-01-08 ENCOUNTER — Encounter: Payer: Self-pay | Admitting: Surgery

## 2024-01-08 ENCOUNTER — Ambulatory Visit (INDEPENDENT_AMBULATORY_CARE_PROVIDER_SITE_OTHER): Admitting: Surgery

## 2024-01-08 VITALS — BP 119/71 | HR 67 | Temp 98.1°F | Resp 16 | Ht 59.0 in | Wt 130.0 lb

## 2024-01-08 DIAGNOSIS — N6489 Other specified disorders of breast: Secondary | ICD-10-CM

## 2024-01-08 NOTE — Progress Notes (Signed)
 [facility] Note   HPI:  53 y.o. Female presents to clinic for follow-up of a left breast fluid collection noted on an outpatient MRI.  Patient has been doing well since her last follow-up with me.  She intermittently has some dull pain to her left breast, but denies worsening pain recently.  She denies fevers, chills, and erythema at the site.  Review of Systems:  All other review of systems: otherwise negative   Vital Signs:  BP 119/71   Pulse 67   Temp 98.1 F (36.7 C) (Oral)   Resp 16   Ht 4' 11 (1.499 m)   Wt 130 lb (59 kg)   SpO2 98%   BMI 26.26 kg/m    Physical Exam:  Physical Exam Vitals reviewed.  Constitutional:      Appearance: Normal appearance.  Chest:     Comments: Well-healed left breast incision site with small underlying fluid palpable, no erythema or induration, nontender to palpation Neurological:     Mental Status: She is alert.     Laboratory studies: None  Imaging:  MRI of the abdomen (12/29/2023): IMPRESSION:  -- No new hepatic lesions.  -- Stable arterially enhancing foci throughout the liver, stable dating back to 2020, LR-2.  -- Cirrhosis.  -- New 6.1 cm fluid collection with thin rim of enhancement along the left inferior breast tissues which may represent postoperative fluid collection however recommend correlation with physical exam to exclude superimposed infection.   Assessment:  53 y.o. yo Female who presents for follow-up after abdominal MRI demonstrating a fluid collection within the left breast.  She is status post left breast lumpectomy on 7/3 for complex sclerosing lesion and radial scar.  Plan:  - Discussed with the patient that these imaging findings are consistent with a seroma at her recent left breast surgical site. - Explained that if she is not having any significant pain associated with the area or evidence of infection, there is no need for any intervention at this time.  Advised that it can take 4 to 6  months for her body to reabsorb the fluid - Information provided to the patient regarding seromas - Follow up with me as needed  All of the above recommendations were discussed with the patient, and all of patient's questions were answered to her expressed satisfaction.  Note: Portions of this report may have been transcribed using voice recognition software. Every effort has been made to ensure accuracy; however, inadvertent computerized transcription errors may still be present.   Dorothyann Brittle, DO [facility] [address] [place], [place] [address] [phone] (office)

## 2024-01-16 DIAGNOSIS — M9905 Segmental and somatic dysfunction of pelvic region: Secondary | ICD-10-CM | POA: Diagnosis not present

## 2024-01-16 DIAGNOSIS — M9902 Segmental and somatic dysfunction of thoracic region: Secondary | ICD-10-CM | POA: Diagnosis not present

## 2024-01-16 DIAGNOSIS — M9903 Segmental and somatic dysfunction of lumbar region: Secondary | ICD-10-CM | POA: Diagnosis not present

## 2024-01-16 DIAGNOSIS — M546 Pain in thoracic spine: Secondary | ICD-10-CM | POA: Diagnosis not present

## 2024-01-22 DIAGNOSIS — K7460 Unspecified cirrhosis of liver: Secondary | ICD-10-CM | POA: Diagnosis not present

## 2024-01-23 DIAGNOSIS — M9905 Segmental and somatic dysfunction of pelvic region: Secondary | ICD-10-CM | POA: Diagnosis not present

## 2024-01-23 DIAGNOSIS — M546 Pain in thoracic spine: Secondary | ICD-10-CM | POA: Diagnosis not present

## 2024-01-23 DIAGNOSIS — M9903 Segmental and somatic dysfunction of lumbar region: Secondary | ICD-10-CM | POA: Diagnosis not present

## 2024-01-23 DIAGNOSIS — M9902 Segmental and somatic dysfunction of thoracic region: Secondary | ICD-10-CM | POA: Diagnosis not present

## 2024-01-30 DIAGNOSIS — M9905 Segmental and somatic dysfunction of pelvic region: Secondary | ICD-10-CM | POA: Diagnosis not present

## 2024-01-30 DIAGNOSIS — M9903 Segmental and somatic dysfunction of lumbar region: Secondary | ICD-10-CM | POA: Diagnosis not present

## 2024-01-30 DIAGNOSIS — M9902 Segmental and somatic dysfunction of thoracic region: Secondary | ICD-10-CM | POA: Diagnosis not present

## 2024-01-30 DIAGNOSIS — M546 Pain in thoracic spine: Secondary | ICD-10-CM | POA: Diagnosis not present

## 2024-02-06 DIAGNOSIS — M9903 Segmental and somatic dysfunction of lumbar region: Secondary | ICD-10-CM | POA: Diagnosis not present

## 2024-02-06 DIAGNOSIS — M546 Pain in thoracic spine: Secondary | ICD-10-CM | POA: Diagnosis not present

## 2024-02-06 DIAGNOSIS — M9905 Segmental and somatic dysfunction of pelvic region: Secondary | ICD-10-CM | POA: Diagnosis not present

## 2024-02-06 DIAGNOSIS — M9902 Segmental and somatic dysfunction of thoracic region: Secondary | ICD-10-CM | POA: Diagnosis not present

## 2024-02-17 ENCOUNTER — Other Ambulatory Visit: Payer: Self-pay | Admitting: *Deleted

## 2024-02-17 ENCOUNTER — Other Ambulatory Visit: Payer: Self-pay | Admitting: "Endocrinology

## 2024-02-17 ENCOUNTER — Other Ambulatory Visit: Payer: Self-pay | Admitting: Medical

## 2024-02-17 ENCOUNTER — Other Ambulatory Visit: Payer: Self-pay | Admitting: Obstetrics & Gynecology

## 2024-02-17 DIAGNOSIS — D696 Thrombocytopenia, unspecified: Secondary | ICD-10-CM

## 2024-02-17 DIAGNOSIS — K7460 Unspecified cirrhosis of liver: Secondary | ICD-10-CM

## 2024-02-17 MED ORDER — FOLIC ACID 1 MG PO TABS: 1.0000 mg | ORAL_TABLET | Freq: Every day | ORAL | 5 refills | Status: AC

## 2024-02-20 ENCOUNTER — Other Ambulatory Visit: Payer: Self-pay | Admitting: "Endocrinology

## 2024-02-21 LAB — COMPREHENSIVE METABOLIC PANEL WITH GFR
ALT: 21 IU/L (ref 0–32)
AST: 34 IU/L (ref 0–40)
Albumin: 5.1 g/dL — ABNORMAL HIGH (ref 3.8–4.9)
Alkaline Phosphatase: 62 IU/L (ref 49–135)
BUN/Creatinine Ratio: 14 (ref 9–23)
BUN: 14 mg/dL (ref 6–24)
Bilirubin Total: 0.5 mg/dL (ref 0.0–1.2)
CO2: 20 mmol/L (ref 20–29)
Calcium: 10.5 mg/dL — ABNORMAL HIGH (ref 8.7–10.2)
Chloride: 99 mmol/L (ref 96–106)
Creatinine, Ser: 1.02 mg/dL — ABNORMAL HIGH (ref 0.57–1.00)
Globulin, Total: 2.7 g/dL (ref 1.5–4.5)
Glucose: 96 mg/dL (ref 70–99)
Potassium: 4.4 mmol/L (ref 3.5–5.2)
Sodium: 139 mmol/L (ref 134–144)
Total Protein: 7.8 g/dL (ref 6.0–8.5)
eGFR: 66 mL/min/1.73 (ref >59)

## 2024-02-21 LAB — LIPID PANEL
Chol/HDL Ratio: 3.4 ratio (ref 0.0–4.4)
Cholesterol, Total: 196 mg/dL (ref 100–199)
HDL: 57 mg/dL (ref >39)
LDL Chol Calc (NIH): 98 mg/dL (ref 0–99)
Triglycerides: 242 mg/dL — ABNORMAL HIGH (ref 0–149)
VLDL Cholesterol Cal: 41 mg/dL — ABNORMAL HIGH (ref 5–40)

## 2024-02-21 LAB — VITAMIN D 25 HYDROXY (VIT D DEFICIENCY, FRACTURES): Vit D, 25-Hydroxy: 105 ng/mL — ABNORMAL HIGH (ref 30.0–100.0)

## 2024-02-21 LAB — CORTISOL-AM, BLOOD: Cortisol - AM: 9.8 ug/dL (ref 6.2–19.4)

## 2024-02-21 LAB — THYROID STIMULATING IMMUNOGLOBULIN: Thyroid Stim Immunoglobulin: <0.1 IU/L (ref 0.00–0.55)

## 2024-02-21 LAB — PTH, INTACT AND CALCIUM: PTH: 26 pg/mL (ref 15–65)

## 2024-02-21 LAB — TSH: TSH: 2.38 u[IU]/mL (ref 0.450–4.500)

## 2024-02-21 LAB — THYROID PEROXIDASE ANTIBODY: Thyroperoxidase Ab SerPl-aCnc: <9 [IU]/mL (ref 0–34)

## 2024-02-21 LAB — PROPEPTIDE TYPE I COLLAGEN: Propeptide Type I Collagen: 19 ng/mL

## 2024-02-21 LAB — ACTH: ACTH: 10.7 pg/mL (ref 7.2–63.3)

## 2024-02-21 LAB — T4, FREE: Free T4: 1.19 ng/dL (ref 0.82–1.77)

## 2024-02-26 ENCOUNTER — Encounter: Payer: Self-pay | Admitting: "Endocrinology

## 2024-02-26 ENCOUNTER — Ambulatory Visit (INDEPENDENT_AMBULATORY_CARE_PROVIDER_SITE_OTHER): Admitting: "Endocrinology

## 2024-02-26 VITALS — BP 122/56 | HR 68 | Ht 59.0 in | Wt 132.6 lb

## 2024-02-26 DIAGNOSIS — E2839 Other primary ovarian failure: Secondary | ICD-10-CM

## 2024-02-26 DIAGNOSIS — M818 Other osteoporosis without current pathological fracture: Secondary | ICD-10-CM

## 2024-02-26 DIAGNOSIS — E039 Hypothyroidism, unspecified: Secondary | ICD-10-CM | POA: Diagnosis not present

## 2024-02-26 DIAGNOSIS — E782 Mixed hyperlipidemia: Secondary | ICD-10-CM | POA: Diagnosis not present

## 2024-02-26 DIAGNOSIS — I10 Essential (primary) hypertension: Secondary | ICD-10-CM

## 2024-02-26 DIAGNOSIS — E559 Vitamin D deficiency, unspecified: Secondary | ICD-10-CM

## 2024-02-26 NOTE — Progress Notes (Signed)
 02/26/2024, 11:34 AM   Endocrinology follow-up note  Subjective:    Patient ID: Latoya Bautista, female    DOB: 07/06/1970, PCP Shona Norleen PEDLAR, MD   Past Medical History:  Diagnosis Date   Anemia    Cirrhosis of liver without ascites (HCC)    gastro/ liver @ [facility]-- lindsey yaxheimer NP;  secondary to HCV,  well compensated   Coronary artery calcification seen on CAT scan    followed by cardiology;   CCT w/ FFR 01-22-2022  calcium  score=88.4, LAD / RCA   DOE (dyspnea on exertion)    GERD (gastroesophageal reflux disease)    H/O stem cell transplant (HCC)    1982 and 1984   Heart murmur    History of acute lymphoblastic leukemia (ALL) in remission 50   dx age 84;   chemo;  whole body radiation;  x2 stem cell transplants @ [facility] 1982 & 1984   History of antineoplastic chemotherapy    child   History of basal cell carcinoma (BCC) excision 10/2016   left leg   History of hepatitis C 2012   pt contracted hep c from blood transfuions's as child in setting of ALL;   08/ 2012 liver bx advanced liver disease mild portal hyertensive gastropathy G2S3, completed 44 wks telaprevir trple bases therapy 12/ 2012 ;  cured and  had  egd 2016 no evidence [facility]   History of kidney stones 2011   History of melanoma in situ 07/2018   vulva bx   History of radiation therapy    whole body radiation in 1980s for ALL   Hyperlipidemia, mixed    Hypersensitivity pneumonitis (HCC)    Hypertension    Hypothyroidism    endocrinologist-- dr charlena lesches   ILD (interstitial lung disease) [facility])    pulmonologist-- dr myrtis gay   Intermittent palpitations    cardiologist--- dr j. 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   oncologist--- dr heber  ENT-- Dr jessee;    recurrent dysplasia bx's since 2005 until 07/ 2021 small resection and larger excisional bx 07/ 2022 with positive margins;   01-18-2021 right partial glossectomy (less than half)   Thrombocytopenia    Past Surgical History:  Procedure Laterality Date   BONE MARROW BIOPSY  05/07/2011   BONE MARROW TRANSPLANT     BREAST BIOPSY Left 09/22/2023   MM LT BREAST BX W LOC DEV 1ST LESION IMAGE BX SPEC STEREO GUIDE 09/22/2023 [facility]   BREAST BIOPSY  10/28/2023   MM LT BREAST SAVI/RF TAG 1ST LESION MAMMO GUIDE 10/28/2023 [facility]   BREAST LUMPECTOMY WITH RADIO FREQUENCY LOCALIZER Left 11/06/2023   Procedure: BREAST LUMPECTOMY WITH RADIO FREQUENCY LOCALIZER;  Surgeon: Evonnie Dorothyann LABOR, DO;  Location: [facility];  Service: General;  Laterality: Left;   BUNIONECTOMY     CATARACT EXTRACTION W/PHACO Right 04/25/2023   Procedure: CATARACT EXTRACTION PHACO AND INTRAOCULAR LENS PLACEMENT (IOC);  Surgeon: Harrie Agent, MD;  Location: [facility];  Service: Ophthalmology;  Laterality: Right;  CDE 1.97   CATARACT EXTRACTION W/PHACO Left 05/09/2023   Procedure: CATARACT EXTRACTION PHACO AND INTRAOCULAR LENS PLACEMENT (IOC);  Surgeon: Harrie Agent, MD;  Location: [facility];  Service: Ophthalmology;  Laterality: Left;  CDE 3.26   CHOLECYSTECTOMY, LAPAROSCOPIC  1994   ESOPHAGOGASTRODUODENOSCOPY  09/03/2016   EXOSTECTECTOMY TOE Right 09/04/2022   Procedure: EXOSTECTECTOMY TOE;  Surgeon: Gershon Donnice SAUNDERS, DPM;  [facility] ;  Service: Podiatry;  Laterality: Right;   FEMUR IM NAIL Right 05/06/2022   Procedure: INTRAMEDULLARY (IM) NAIL FEMORAL;  Surgeon: Kendal Franky SQUIBB, MD;  Location: [facility];  Service: Orthopedics;  Laterality: Right;   MOHS SURGERY  2023   spring   PARTIAL GLOSSECTOMY  01/18/2021   @ [facility];  less than half , right lateral   Social History   Socioeconomic History   Marital status: Married     Spouse name: Alm   Number of children: 0   Years of education: 12th   Highest education level: Not on file  Occupational History    Employer: [employer]  Tobacco Use   Smoking status: Never    Passive exposure: Never   Smokeless tobacco: Never  Vaping Use   Vaping status: Never Used  Substance and Sexual Activity   Alcohol use: No   Drug use: Never   Sexual activity: Not Currently    Birth control/protection: None, Post-menopausal  Other Topics Concern   Not on file  Social History Narrative   Patient lives at home with her spouse.   Caffeine Use: 16oz bottle of soda daily   Social Drivers of Corporate investment banker Strain: Low Risk  (03/18/2023)   Overall Financial Resource Strain (CARDIA)    Difficulty of Paying Living Expenses: Not very hard  Food Insecurity: No Food Insecurity (11/18/2023)   Received from [facility]   Hunger Vital Sign    Within the past 12 months, you worried that your food would run out before you got the money to buy more.: Never true    Within the past 12 months, the food you bought just didn't last and you didn't have money to get more.: Never true  Transportation Needs: No Transportation Needs (11/18/2023)   Received from [facility]   PRAPARE - Transportation    Lack of Transportation (Medical): No    Lack of Transportation (Non-Medical): No  Physical Activity: Sufficiently Active (03/18/2023)   Exercise Vital Sign    Days of Exercise per Week: 6 days    Minutes of Exercise per Session: 100 min  Stress: No Stress Concern Present (03/18/2023)   [employer] of Occupational Health - Occupational Stress Questionnaire    Feeling of Stress : Not at all  Social Connections: Socially Integrated (03/18/2023)   Social Connection and Isolation Panel    Frequency of Communication with Friends and Family: Twice a week    Frequency of Social Gatherings with Friends and Family: More than three times a week    Attends  Religious Services: More than 4 times per year    Active Member of [employer] or Organizations: Yes    Attends Engineer, structural: More than 4 times per year    Marital Status: Married   Family History  Problem Relation Age of Onset   Thyroid  disease Mother    Hypertension Father    Hyperlipidemia Father    Stomach cancer Maternal  Aunt        great aunt   Throat cancer Maternal Aunt    Diabetes Maternal Uncle    Throat cancer Maternal Uncle    Stomach cancer Maternal Uncle    Arthritis Maternal Great-grandmother    Outpatient Encounter Medications as of 02/26/2024  Medication Sig   Ascorbic Acid (VITAMIN C) 1000 MG tablet Take 1,000 mg by mouth daily.   Cholecalciferol (VITAMIN D ) 50 MCG (2000 UT) CAPS Take 2,000 Units by mouth daily.   clobetasol ointment (TEMOVATE) 0.05 % Apply topically.   Coenzyme Q10 (COQ-10) 100 MG CAPS Take 1 capsule by mouth daily.   estradiol  (ESTRACE ) 1 MG tablet Take 1 tablet (1 mg total) by mouth daily.   famotidine  (PEPCID ) 20 MG tablet Take 1 tablet (20 mg total) by mouth 2 (two) times daily.   fenofibrate  54 MG tablet Take 54 mg by mouth daily.   folic acid  (FOLVITE ) 1 MG tablet Take 1 tablet (1 mg total) by mouth daily.   Garlic 1000 MG CAPS Take 1 capsule by mouth daily.   glucosamine-chondroitin 500-400 MG tablet Take 1 tablet by mouth 2 (two) times daily.   lamoTRIgine  (LAMICTAL  XR) 50 MG 24 hour tablet Take 3 tablets (150 mg total) by mouth 2 (two) times daily.   levothyroxine  (SYNTHROID ) 125 MCG tablet take 0.5 tablets (62.5 MICROGRAM total) by mouth daily.   Lycopene 10 MG CAPS Take 1 capsule by mouth daily.   MAGNESIUM PO Take 1 tablet by mouth daily.   Menaquinone-7 (VITAMIN K2) 100 MCG CAPS Take 1 capsule by mouth daily.   Metoprolol  Tartrate 75 MG TABS Take 1 tablet (75 mg total) by mouth 2 (two) times daily.   Multiple Vitamins-Minerals (CENTRUM PO) Take 1 tablet by mouth daily.     Omega-3 Fatty Acids (OMEGA-3 FISH OIL PO) Take 1  capsule by mouth daily.   pantoprazole  (PROTONIX ) 40 MG tablet Take 1 tablet (40 mg total) by mouth daily.   Probiotic Product (PROBIOTIC DAILY) CAPS Take 1 capsule by mouth daily.   progesterone  (PROMETRIUM ) 100 MG capsule take 1 capsule (100 MILLIGRAM total) by mouth daily. takes one tablet on 1st of month through 15th   Quercetin 500 MG CAPS Take 1 capsule by mouth daily.   rosuvastatin  (CRESTOR ) 40 MG tablet take 1 tablet (40 MILLIGRAM total) by mouth at bedtime.   sodium bicarbonate  325 MG tablet Take 325 mg by mouth 2 (two) times daily.   VITAMIN E PO Take 1 capsule by mouth daily.   zoledronic  acid (RECLAST ) 5 MG/100ML SOLN injection Inject 5 mg into the vein once.   [DISCONTINUED] Vitamin D , Ergocalciferol , (DRISDOL ) 1.25 MG (50000 UNIT) CAPS capsule Take 1 capsule (50,000 Units total) by mouth See admin instructions. Take 1 capsule (50,000 units) by mouth every other week.   No facility-administered encounter medications on file as of 02/26/2024.   ALLERGIES: Allergies  Allergen Reactions   Simvastatin Rash   Briviact [Brivaracetam]     Elevated LFT's   Asparaginase Derivatives Rash   Elspar [Asparaginase] Rash   Keppra  [Levetiracetam ] Rash   Soap Rash and Other (See Comments)    Procedure prep-soap (not Betadine , either- per the patient)   Tape Rash and Other (See Comments)    Redness from the tape    VACCINATION STATUS: Immunization History  Administered Date(s) Administered   Influenza, Seasonal, Injecte, Preservative Fre 02/21/2011   Influenza,inj,Quad PF,6+ Mos 05/13/2012, 02/03/2021   Influenza,inj,quad, With Preservative 02/17/2017   Influenza-Unspecified 03/04/2014, 03/22/2022  PNEUMOCOCCAL CONJUGATE-20 01/16/2022   Zoster Recombinant(Shingrix) 01/16/2022    HPI MYKA HITZ is 53 y.o. female who presents today with a medical history as above. she is being seen in follow-up after she was seen in consultation for her osteoporosis requested by Shona Norleen PEDLAR,  MD.   See notes from her last visit.  She has a long and complicated medical history. Patient gives history of leukemia which required chemotherapy and radiation therapy as a girl at 52.  This rendered her with primary amenorrhea.  She was treated with estrogen and progesterone  combination at least from puberty.  She never had normal menstrual flow and cycle. At age 33, she was diagnosed with osteopenia for which she has received sequential treatment more or less with the following medications.  She took Fosamax for at least a decade followed by 1 year of Reclast  treatment.  This was followed by romosozumab  for 1 year and then Prolia  for only 3 doses before she was given Forteo  only for 1 year.  These truncated treatment courses are largely due to insurance lapses and change of her providers/practices.  She has received her last treatment with Reclast  in November 2024.  And she has favorable labs to continue her Reclast  this year.  She has history of stress fractures of bilateral lower extremities (2021-2022) and right femoral fracture in December 2013.  Her most recent bone density is from February 2024 at [facility] which showed a T-score of -0.3 on the lumbar spine with 27.7% gain compared to her previous study from 2005.  She did have a T-score of -1.3 on left hip with a gain of 18.1% compared to last study and a T-score of -2.3 on femoral neck with a gain of 2% compared to the previous study.  She does not have acute complaints today.  She has no interval falls nor fractures.  She also has hypothyroidism for which she is on levothyroxine -currently on levothyroxine  125 mcg p.o. daily before breakfast..  Her current medications include vitamin D2 50,000 units every other week, estradiol  1 mg daily, progesterone  100 mg daily.  Her previsit labs show excessive vitamin D . Her other medical problems include hyperlipidemia and hypertension for which she is taking Crestor  40 mg p.o. nightly and  fenofibrate  54 mg p.o. daily, metoprolol  75 mg p.o. twice daily. She is on several supplements including vitamin C, CoQ10, multivitamins, omega-3 fatty acids, lycopene, vitamin E, garlic, glucosamine-chondroitin, vitamin K 2, folic acid . She presents with steady weight. She denies any height loss.  She gives history of treatment with growth hormone at early years. She exercises regularly, does not smoke nor drink alcohol.  She does not follow any particular diet plan. There is no family or personal history of osteogenesis imperfecta nor any skeletal syndromes.  Review of Systems  Constitutional: +mildly fluctuating body weight ,  no fatigue, no subjective hyperthermia, no subjective hypothermia Eyes: no blurry vision, no xerophthalmia   Objective:       02/26/2024    9:23 AM 01/08/2024    9:44 AM 12/22/2023    1:18 PM  Vitals with BMI  Height 4' 11 4' 11 4' 11  Weight 132 lbs 10 oz 130 lbs 132 lbs 3 oz  BMI 26.77 26.24 26.69  Systolic 122 119 873  Diastolic 56 71 74  Pulse 68 67 80    BP (!) 122/56   Pulse 68   Ht 4' 11 (1.499 m)   Wt 132 lb 9.6 oz (60.1 kg)  BMI 26.78 kg/m   Wt Readings from Last 3 Encounters:  02/26/24 132 lb 9.6 oz (60.1 kg)  01/08/24 130 lb (59 kg)  12/22/23 132 lb 3.2 oz (60 kg)    Physical Exam  Constitutional:  Body mass index is 26.78 kg/m.,  not in acute distress, normal state of mind Eyes: PERRLA, EOMI, no exophthalmos ENT: moist mucous membranes, no gross thyromegaly, no gross cervical lymphadenopathy   CMP ( most recent) CMP     Component Value Date/Time   NA 139 02/19/2024 0818   K 4.4 02/19/2024 0818   CL 99 02/19/2024 0818   CO2 20 02/19/2024 0818   GLUCOSE 96 02/19/2024 0818   GLUCOSE 102 (H) 10/10/2023 1146   BUN 14 02/19/2024 0818   CREATININE 1.02 (H) 02/19/2024 0818   CALCIUM  10.5 (H) 02/19/2024 0818   CALCIUM  8.2 (L) 12/19/2009 1013   PROT 7.8 02/19/2024 0818   ALBUMIN 5.1 (H) 02/19/2024 0818   AST 34 02/19/2024  0818   ALT 21 02/19/2024 0818   ALKPHOS 62 02/19/2024 0818   BILITOT 0.5 02/19/2024 0818   EGFR 66 02/19/2024 0818   GFRNONAA >60 10/10/2023 1146    Lipid Panel ( most recent) Lipid Panel     Component Value Date/Time   CHOL 196 02/19/2024 0818   TRIG 242 (H) 02/19/2024 0818   HDL 57 02/19/2024 0818   CHOLHDL 3.4 02/19/2024 0818   CHOLHDL 3.1 01/23/2022 0804   VLDL 29 01/23/2022 0804   LDLCALC 98 02/19/2024 0818   LABVLDL 41 (H) 02/19/2024 0818      Lab Results  Component Value Date   TSH 2.380 02/19/2024   TSH 2.273 11/12/2022   TSH 0.112 (L) 05/08/2022   TSH 2.065 11/07/2021   TSH 1.359 05/10/2021   FREET4 1.19 02/19/2024      Assessment & Plan:   Osteoporosis 2.  Hypothyroidism 3.  Hypogonadism 4.  Vitamin D  deficiency 5.  Hyperlipidemia 6.  Hypertension  - HERMILA MILLIS  is being seen at a kind request of Shona, Norleen PEDLAR, MD. - I have reviewed her new and available  records and clinically evaluated the patient. - Based on these reviews, she has osteoporosis of likely multiple etiologies including prepubertal onset hypogonadism.  She likely never developed peak bone mass.    -Even while on treatment over the years, she did have stress fractures x 2 occasions between  2021 - 2022 on bilateral lower extremities and femoral fracture January 2024, she seems to have benefited from sequential treatment applications as detailed above.    Her right femoral fracture in December 2023 was while she was on Forteo .  It is not clear if this was an atypical femur fracture, alkaline phosphatase was 79 at that time.  No bone remodeling labs were obtained.  - She was treated with oral bisphosphonates for at least a decade until her mid 30s, romosozumab  for 1 year, Prolia  only for 3 doses before she was treated for a year with Forteo .   She is currently on Reclast , received only 1 treatment in November 2024, preparing to get her second treatment November 2025.     She will continue  to need antiosteoporosis treatment to lower or control her fracture risk. I approached her with options going forward.    I discussed potential risk factors of antiresorptive treatment including ONJ, atypical femur fractures, and treatment withdrawal associated bone loss.  Her 2 reasonable options include extension of Reclast  or switch to Prolia . She is  not a candidate for drug holiday at this time.    Considering her recent satisfactory treatment response and her relative youth, I recommended extension of Reclast  treatment for at least a total of 3 years with periodic bone density assessment.  She will have her next bone density in February 2026.  -If her labs are not favorable, or if he develops another fracture, she will be switched to Prolia /Jubbonti for long-term. - She has a regular dental follow-up, no acute dental concern at this time. She is made aware of the fact that when she starts Prolia , it would be a long-term commitment. She does seem to have insurance coverage issues-another reason to keep treatment affordable for her.  -She is advised on adequate protein intake, continued safe exercise programs.  She is advised to avoid vigorous exercise activities.  She will continue to benefit from her hormone replacement therapy and advised to maintain her follow-up with her OB/GYN providers.  Regarding her hypothyroidism: Etiology not clear.  She is advised to continue levothyroxine  62.5 mg p.o. daily before breakfast.     - We discussed about the correct intake of her thyroid  hormone, on empty stomach at fasting, with water , separated by at least 30 minutes from breakfast and other medications,  and separated by more than 4 hours from calcium , iron, multivitamins, acid reflux medications (PPIs). -Patient is made aware of the fact that thyroid  hormone replacement is needed for life, dose to be adjusted by periodic monitoring of thyroid  function tests.   Her previsit labs show adequate a.m.  cortisol at 9.8 with ACTH  of 10.7, PTH 26 For previsit labs show supraphysiologic vitamin D .  She is advised to discontinue her prescription strength vitamin D2 and maintain with vitamin D3 to 2000 units daily. -She is advised to continue treatment for hyperlipidemia and hypertension.  - she is advised to maintain close follow up with Shona Norleen PEDLAR, MD for primary care needs.   I spent  27  minutes in the care of the patient today including review of labs from Thyroid  Function, CMP, and other relevant labs ; imaging/biopsy records (current and previous including abstractions from other facilities); face-to-face time discussing  her lab results and symptoms, medications doses, her options of short and long term treatment based on the latest standards of care / guidelines;   and documenting the encounter.  Latoya Bautista  participated in the discussions, expressed understanding, and voiced agreement with the above plans.  All questions were answered to her satisfaction. she is encouraged to contact clinic should she have any questions or concerns prior to her return visit.   Follow up plan: Return in about 6 months (around 08/26/2024) for Refer her for Reclast  Infusion, F/U with Pre-visit Labs.   Ranny Earl, MD [facility] [address] [place], [place] [address] Phone: [phone]  Fax: [phone]     02/26/2024, 11:34 AM  This note was partially dictated with voice recognition software. Similar sounding words can be transcribed inadequately or may not  be corrected upon review.

## 2024-03-12 ENCOUNTER — Telehealth: Payer: Self-pay | Admitting: "Endocrinology

## 2024-03-12 NOTE — Telephone Encounter (Signed)
 Pt has come by to see about when reclast  would be scheduled.  Says she has not gotten a phone call and is due this month.

## 2024-03-17 NOTE — Progress Notes (Signed)
 Cardiology Office Note   Date:  03/19/2024  ID:  Latoya Bautista, DOB 06/16/1970, MRN [id number] PCP: Shona Norleen PEDLAR, MD  [facility] Providers Cardiologist:  Alvan Carrier, MD     History of Present Illness Latoya Bautista is a 53 y.o. female with a past medical history of nonobstructive CAD per coronary CT, hypertension, ILD, liver cirrhosis, history of invasive squamous cell cancer of tongue, hypothyroidism, thrombocytopenia, dyslipidemia, childhood leukemia.   01/22/2022 coronary CT calcium  score 88.4, 90th percentile, FFR negative for hemodynamic significance 12/12/2021 echo EF 60 to 65%, trivial MR, trivial AR 08/11/2017 monitor average heart rate 102 bpm, predominant rhythm sinus 07/28/2017 echo EF 55 to 60%, grade 1 DD  She is a longstanding patient of Dr. Alvan initially established with him in 2019 for the evaluation of palpitations, a monitor was arranged at that time revealing predominantly sinus tachycardia and she was started on a beta-blocker.  Echocardiogram at that time revealed an EF of 55 to 60% and grade 1 DD.  In 2023 she underwent a coronary CT revealed a calcium  score of 88.4, 90th percentile but FFR was negative for hemodynamic significance.    Most recently she was evaluated by Cadence Furth PA on 03/07/2023, she was stable from a cardiac perspective although she had recently suffered a hip fracture, underwent surgery and completed rehab, she was going to the [facility] daily, did have some shortness of breath however nothing atypical for her underlying ILD.  No changes made to medications or plan of care and she is advised she can follow-up in 1 year.  She presents today for follow-up.  She has been doing well since she was last evaluated in our office, no formal complaints from a cardiac perspective.  She continues to workout at the [facility] 6 days a week.  She checks her blood pressure daily, presents a blood pressure log and sometimes it is elevated for Singh in the morning  but overall her blood pressure looks well-controlled.  We reviewed her recent lab work and her LDL being elevated beyond what we would prefer at 98. She denies chest pain, palpitations, dyspnea, pnd, orthopnea, n, v, dizziness, syncope, edema, weight gain, or early satiety.    ROS: Review of Systems  All other systems reviewed and are negative.    Studies Reviewed      Cardiac Studies & Procedures   ______________________________________________________________________________________________     ECHOCARDIOGRAM  ECHOCARDIOGRAM COMPLETE 12/12/2021  Narrative ECHOCARDIOGRAM REPORT    Patient Name:   Latoya Bautista Date of Exam: 12/12/2021 Medical Rec #:  [id number]      Height:       59.0 in Accession #:    [id number]     Weight:       150.0 lb Date of Birth:  [birth year]      BSA:          1.632 m Patient Age:    50 years       BP:           152/86 mmHg Patient Gender: F              HR:           94 bpm. Exam Location:  [facility]  Procedure: 2D Echo, Cardiac Doppler and Color Doppler  Indications:    Aortic valve disorder  History:        Patient has prior history of Echocardiogram examinations, most recent 07/28/2017. Signs/Symptoms:Murmur. Tongue CA, Hepatitis C, Leukemia, Thyroid   disease.  Sonographer:    Naomie Reef Referring Phys: [id number] OLIVIA HERO [facility]   Sonographer Comments: Image acquisition challenging due to respiratory motion. IMPRESSIONS   1. Left ventricular ejection fraction, by estimation, is 60 to 65%. Left ventricular ejection fraction by 2D MOD biplane is 62.0 %. The left ventricle has normal function. The left ventricle has no regional wall motion abnormalities. Indeterminate diastolic filling due to E-A fusion. 2. Right ventricular systolic function is normal. The right ventricular size is normal. 3. The mitral valve is grossly normal. Trivial mitral valve regurgitation. No evidence of mitral stenosis. 4. The aortic valve is tricuspid. Aortic valve  regurgitation is trivial. No aortic stenosis is present.  Comparison(s): No significant change from prior study.  FINDINGS Left Ventricle: Left ventricular ejection fraction, by estimation, is 60 to 65%. Left ventricular ejection fraction by 2D MOD biplane is 62.0 %. The left ventricle has normal function. The left ventricle has no regional wall motion abnormalities. The left ventricular internal cavity size was normal in size. There is no left ventricular hypertrophy. Indeterminate diastolic filling due to E-A fusion.  Right Ventricle: The right ventricular size is normal. No increase in right ventricular wall thickness. Right ventricular systolic function is normal.  Left Atrium: Left atrial size was normal in size.  Right Atrium: Right atrial size was normal in size.  Pericardium: Trivial pericardial effusion is present.  Mitral Valve: The mitral valve is grossly normal. Trivial mitral valve regurgitation. No evidence of mitral valve stenosis. MV peak gradient, 5.8 mmHg. The mean mitral valve gradient is 2.0 mmHg.  Tricuspid Valve: The tricuspid valve is grossly normal. Tricuspid valve regurgitation is trivial. No evidence of tricuspid stenosis.  Aortic Valve: The aortic valve is tricuspid. Aortic valve regurgitation is trivial. No aortic stenosis is present. Aortic valve mean gradient measures 3.0 mmHg. Aortic valve peak gradient measures 6.6 mmHg. Aortic valve area, by VTI measures 2.64 cm.  Pulmonic Valve: The pulmonic valve was grossly normal. Pulmonic valve regurgitation is not visualized. No evidence of pulmonic stenosis.  Aorta: The aortic root and ascending aorta are structurally normal, with no evidence of dilitation.  Venous: The inferior vena cava was not well visualized.  IAS/Shunts: The atrial septum is grossly normal.   LEFT VENTRICLE PLAX 2D                        Biplane EF (MOD) LVIDd:         4.00 cm         LV Biplane EF:   Left LVIDs:         2.60 cm                           ventricular LV PW:         0.80 cm                          ejection LV IVS:        0.80 cm                          fraction by LVOT diam:     1.90 cm                          2D MOD LV SV:         69  biplane is LV SV Index:   42                               62.0 %. LVOT Area:     2.84 cm Diastology LV e' medial:    5.33 cm/s LV Volumes (MOD)               LV E/e' medial:  17.3 LV vol d, MOD    33.7 ml       LV e' lateral:   11.60 cm/s A2C:                           LV E/e' lateral: 7.9 LV vol d, MOD    37.5 ml A4C: LV vol s, MOD    12.4 ml A2C: LV vol s, MOD    14.9 ml A4C: LV SV MOD A2C:   21.3 ml LV SV MOD A4C:   37.5 ml LV SV MOD BP:    22.1 ml  RIGHT VENTRICLE RV Basal diam:  2.70 cm RV Mid diam:    2.70 cm RV S prime:     14.40 cm/s TAPSE (M-mode): 1.9 cm  LEFT ATRIUM             Index        RIGHT ATRIUM          Index LA diam:        3.30 cm 2.02 cm/m   RA Area:     9.86 cm LA Vol (A2C):   27.3 ml 16.73 ml/m  RA Volume:   20.30 ml 12.44 ml/m LA Vol (A4C):   30.1 ml 18.44 ml/m LA Biplane Vol: 29.3 ml 17.95 ml/m AORTIC VALVE                    PULMONIC VALVE AV Area (Vmax):    2.47 cm     PV Vmax:       0.92 m/s AV Area (Vmean):   2.70 cm     PV Peak grad:  3.4 mmHg AV Area (VTI):     2.64 cm AV Vmax:           128.00 cm/s AV Vmean:          81.400 cm/s AV VTI:            0.260 m AV Peak Grad:      6.6 mmHg AV Mean Grad:      3.0 mmHg LVOT Vmax:         111.50 cm/s LVOT Vmean:        77.650 cm/s LVOT VTI:          0.242 m LVOT/AV VTI ratio: 0.93  AORTA Ao Root diam: 2.80 cm Ao Asc diam:  2.80 cm  MITRAL VALVE                TRICUSPID VALVE MV Area (PHT): 6.17 cm     TR Peak grad:   18.3 mmHg MV Area VTI:   3.69 cm     TR Vmax:        214.00 cm/s MV Peak grad:  5.8 mmHg MV Mean grad:  2.0 mmHg     SHUNTS MV Vmax:       1.20 m/s     Systemic VTI:  0.24 m MV Vmean:      69.0 cm/s  Systemic Diam:  1.90 cm MV Decel Time: 123 msec MV E velocity: 92.10 cm/s MV A velocity: 116.00 cm/s MV E/A ratio:  0.79  Darryle Decent MD Electronically signed by Darryle Decent MD Signature Date/Time: 12/12/2021/9:43:40 AM    Final    MONITORS  CARDIAC EVENT MONITOR 08/11/2017  Narrative  14 day event monitor  Min HR 71, Max HR 173, Avg HR 102  Reported symptoms correlated with sinus rhythm and sinus tachycardia. Rare PACs and PVCs  No significant arrhythmias   CT SCANS  CT CORONARY FRACTIONAL FLOW RESERVE DATA PREP 01/22/2022  Narrative EXAM: CT FFR ANALYSIS  CLINICAL DATA:  CAD  FINDINGS: FFRct analysis was performed on the original cardiac CT angiogram dataset. Diagrammatic representation of the FFRct analysis is provided in a separate PDF document in PACS. This dictation was created using the PDF document and an interactive 3D model of the results. 3D model is not available in the EMR/PACS. Normal FFR range is >0.80. Indeterminate (grey) zone is 0.76-0.80.  1. Left Main: FFR = 0.97  2. LAD: Proximal FFR = 0.97, mid FFR = 0.89, distal FFR = not analyzed 3. LCX: Proximal FFR = 0.98, distal FFR = 0.96 4. RCA: Proximal FFR = 0.98, mid FFR =0.90, distal FFR = 0.88  IMPRESSION: 1.  CT FFR analysis showed no significant stenosis.  RECOMMENDATIONS: Guideline-directed medical therapy and aggressive risk factor modification for secondary prevention of coronary artery disease.   Electronically Signed By: Kardie  Tobb D.O. On: 01/22/2022 11:49   CT SCANS  CT CORONARY MORPH W/CTA COR W/SCORE 01/22/2022  Addendum 01/22/2022  1:00 PM ADDENDUM REPORT: 01/22/2022 12:58  EXAM: OVER-READ INTERPRETATION  CT CHEST  The following report is an over-read performed by radiologist Dr. Tanda Lyons of [facility] on 01/22/2022. This over-read does not include interpretation of cardiac or coronary anatomy or pathology. The coronary calcium  score/coronary  CTA interpretation by the cardiologist is attached.  COMPARISON:  Chest two views 03/07/2021 and 03/17/2015; CT chest 10/24/2021  FINDINGS: Cardiovascular: There are no significant extracardiac vascular findings.  Mediastinum/Nodes: There are no enlarged lymph nodes within the visualized mediastinum.  Lungs/Pleura: There is no pleural effusion. There is again patchy ground-glass attenuation, interlobular septal thickening, and curvilinear densities within the partially visualized middle lobe, lingula, and anterolateral right lower lobe. This is again consistent with interstitial lung disease.  Upper abdomen: Low-density is again seen within the liver suggesting fatty infiltration.  Musculoskeletal/Chest wall: No chest wall mass or suspicious osseous findings within the visualized chest.  IMPRESSION: Redemonstration of pulmonary fibrosis, as on prior CT.   Electronically Signed By: Tanda Lyons M.D. On: 01/22/2022 12:58  Narrative CLINICAL DATA:  This is a 53 year old female with anginal symptoms.  EXAM: Cardiac/Coronary  CTA  TECHNIQUE: The patient was scanned on a [employer].  FINDINGS: A 100 kV prospective scan was triggered in the descending thoracic aorta at 111 HU's. Axial non-contrast 3 mm slices were carried out through the heart. The data set was analyzed on a dedicated work station and scored using the Agatson method. Gantry rotation speed was 250 msecs and collimation was .6 mm. No beta blockade and 0.8 mg of sl NTG was given. The 3D data set was reconstructed in 5% intervals of the 67-82 % of the R-R cycle. Diastolic phases were analyzed on a dedicated work station using MPR, MIP and VRT modes. The patient received 80 cc of contrast.  Image Quality: Fair, with misregistration artifact.  Aorta:  Normal size. Mild aortic root calcifications. No dissection.  Aortic Valve:  Trileaflet.  No calcifications.  Coronary Arteries:  Normal  coronary origin.  Right dominance.  RCA is a large dominant artery that gives rise to PDA and PLA. There is a mild (24-49%) mixed plaque in the proximal RCA. The mid RCA with moderate (50-69%) calcified plaques. The mid to distal RCA with minimal (<24%) calcifications.  Left main is a large artery that gives rise to LAD and LCX arteries. Minimal calcification in the mid Left main artery.  LAD is a large vessel. The proximal LAD with mild soft plaque. In the mid LAD there is a mixed mild plaque. The distal LAD with no plaques.  LCX is a non-dominant artery that gives rise to one large OM1 branch. There is mild soft plaque in the proximal to mid LCX. The distal LCX with no plaques.  Coronary Calcium  Score:  Left main: 2.90  Left anterior descending artery: 46.7  Left circumflex artery: 0  Right coronary artery: 38.8  Total: 88.4  Percentile: 98  Other findings:  Normal pulmonary vein drainage into the left atrium.  Normal left atrial appendage without a thrombus.  Normal size of the pulmonary artery.  Mild mitral annular calcification.  IMPRESSION: 1. Coronary calcium  score of 88.4. This was 44 percentile for age and sex matched control.  2. Normal coronary origin with right dominance.  3. CAD-RADS 3. Moderate stenosis. Consider symptom-guided anti-ischemic pharmacotherapy as well as risk factor modification per guideline directed care. Additional analysis with CT FFR will be submitted.  The noncardiac portion of this study will be interpreted in separate report by the radiologist.  Electronically Signed: By: Kardie  Tobb D.O. On: 01/22/2022 11:47     ______________________________________________________________________________________________      Risk Assessment/Calculations           Physical Exam VS:  BP (!) 118/56 (BP Location: Left Arm, Patient Position: Sitting, Cuff Size: Normal)   Pulse 80   Resp 14   Ht 4' 11 (1.499 m)   Wt 132 lb 6.4  oz (60.1 kg)   SpO2 98%   BMI 26.74 kg/m        Wt Readings from Last 3 Encounters:  03/19/24 132 lb 6.4 oz (60.1 kg)  03/19/24 134 lb (60.8 kg)  02/26/24 132 lb 9.6 oz (60.1 kg)    GEN: Well nourished, well developed in no acute distress NECK: No JVD; No carotid bruits CARDIAC: RRR, no murmurs, rubs, gallops RESPIRATORY:  Clear to auscultation without rales, wheezing or rhonchi  ABDOMEN: Soft, non-tender, non-distended EXTREMITIES:  No edema; No deformity   ASSESSMENT AND PLAN CAD-coronary CT revealed 88.4, 90th percentile, FFR negative for hemodynamic significance in 2023. Stable with no anginal symptoms. No indication for ischemic evaluation.    Palpitations-currently quiescent.  Continue metoprolol  75 mg twice daily.  Hypertension-blood pressure is well-controlled today, continue metoprolol  75 mg twice daily.  She keeps a meticulous blood pressure log.  Dyslipidemia-most recent LDL was elevated 98, would like this to be 70 or less.  Will check LP(a) and apo B.  Currently on Crestor  40 mg daily.  LFTs normal on 02/19/2024.  Plan to add Zetia 10 mg daily but will wait to do this until received the results of her lab work.  Continue fenofibrate  54 mg daily.       Dispo: LP(a), ApoB, follow-up in 1 year with Dr. Alvan.  Signed, Delon JAYSON Hoover, NP

## 2024-03-17 NOTE — Telephone Encounter (Signed)
 Order for Reclast  resent to [facility].

## 2024-03-19 ENCOUNTER — Encounter: Payer: Self-pay | Admitting: Obstetrics & Gynecology

## 2024-03-19 ENCOUNTER — Ambulatory Visit: Attending: Cardiology | Admitting: Cardiology

## 2024-03-19 ENCOUNTER — Encounter: Payer: Self-pay | Admitting: Cardiology

## 2024-03-19 ENCOUNTER — Other Ambulatory Visit: Payer: Self-pay | Admitting: Cardiology

## 2024-03-19 ENCOUNTER — Ambulatory Visit: Admitting: Obstetrics & Gynecology

## 2024-03-19 ENCOUNTER — Other Ambulatory Visit (HOSPITAL_COMMUNITY): Payer: Self-pay | Admitting: "Endocrinology

## 2024-03-19 VITALS — BP 134/76 | HR 94 | Ht 59.0 in | Wt 134.0 lb

## 2024-03-19 VITALS — BP 118/56 | HR 80 | Resp 14 | Ht 59.0 in | Wt 132.4 lb

## 2024-03-19 DIAGNOSIS — Z79890 Hormone replacement therapy (postmenopausal): Secondary | ICD-10-CM | POA: Diagnosis not present

## 2024-03-19 DIAGNOSIS — E782 Mixed hyperlipidemia: Secondary | ICD-10-CM

## 2024-03-19 DIAGNOSIS — Z01419 Encounter for gynecological examination (general) (routine) without abnormal findings: Secondary | ICD-10-CM | POA: Diagnosis not present

## 2024-03-19 DIAGNOSIS — I10 Essential (primary) hypertension: Secondary | ICD-10-CM | POA: Diagnosis not present

## 2024-03-19 DIAGNOSIS — I2510 Atherosclerotic heart disease of native coronary artery without angina pectoris: Secondary | ICD-10-CM | POA: Diagnosis not present

## 2024-03-19 DIAGNOSIS — R002 Palpitations: Secondary | ICD-10-CM

## 2024-03-19 MED ORDER — PROGESTERONE 200 MG PO CAPS: ORAL_CAPSULE | ORAL | 11 refills | Status: AC

## 2024-03-19 MED ORDER — ESTRADIOL 1 MG PO TABS: 1.0000 mg | ORAL_TABLET | Freq: Every day | ORAL | 11 refills | Status: AC

## 2024-03-19 NOTE — Progress Notes (Signed)
 Subjective:     Latoya Bautista is a 53 y.o. female here for a routine exam.  No LMP recorded. Patient is postmenopausal. G0P0000 Birth Control Method:  pm Menstrual Calendar(currently): amenorrhea  Current complaints: none.   Current acute medical issues:  none   Recent Gynecologic History No LMP recorded. Patient is postmenopausal. Last Pap: 03/2023,  normal Last mammogram: 6/25,  normal  Past Medical History:  Diagnosis Date   Anemia    Cirrhosis of liver without ascites (HCC)    gastro/ liver @ [facility]-- lindsey yaxheimer NP;  secondary to HCV,  well compensated   Coronary artery calcification seen on CAT scan    followed by cardiology;   CCT w/ FFR 01-22-2022  calcium  score=88.4, LAD / RCA   DOE (dyspnea on exertion)    GERD (gastroesophageal reflux disease)    H/O stem cell transplant (HCC)    1982 and 1984   Heart murmur    History of acute lymphoblastic leukemia (ALL) in remission 67   dx age 1;   chemo;  whole body radiation;  x2 stem cell transplants @ [facility] 1982 & 1984   History of antineoplastic chemotherapy    child   History of basal cell carcinoma (BCC) excision 10/2016   left leg   History of hepatitis C 2012   pt contracted hep c from blood transfuions's as child in setting of ALL;   08/ 2012 liver bx advanced liver disease mild portal hyertensive gastropathy G2S3, completed 44 wks telaprevir trple bases therapy 12/ 2012 ;  cured and  had  egd 2016 no evidence [facility]   History of kidney stones 2011   History of melanoma in situ 07/2018   vulva bx   History of radiation therapy    whole body radiation in 1980s for ALL   Hyperlipidemia, mixed    Hypersensitivity pneumonitis (HCC)    Hypertension    Hypothyroidism    endocrinologist-- dr charlena lesches   ILD (interstitial lung disease) [facility])    pulmonologist-- dr myrtis gay   Intermittent palpitations    cardiologist--- dr j. 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   oncologist--- dr heber  ENT-- Dr jessee;    recurrent dysplasia bx's since 2005 until 07/ 2021 small resection and larger excisional bx 07/ 2022 with positive margins;   01-18-2021 right partial glossectomy (less than half)   Thrombocytopenia     Past Surgical History:  Procedure Laterality Date   BONE MARROW BIOPSY  05/07/2011   BONE MARROW TRANSPLANT     BREAST BIOPSY Left 09/22/2023   MM LT BREAST BX W LOC DEV 1ST LESION IMAGE BX SPEC STEREO GUIDE 09/22/2023 [facility]   BREAST BIOPSY  10/28/2023   MM LT BREAST SAVI/RF TAG 1ST LESION MAMMO GUIDE 10/28/2023 [facility]   BREAST LUMPECTOMY WITH RADIO FREQUENCY LOCALIZER Left 11/06/2023   Procedure: BREAST LUMPECTOMY WITH RADIO FREQUENCY LOCALIZER;  Surgeon: Evonnie Dorothyann LABOR, DO;  Location: [facility];  Service: General;  Laterality: Left;   BUNIONECTOMY     CATARACT EXTRACTION W/PHACO Right 04/25/2023   Procedure: CATARACT EXTRACTION PHACO AND INTRAOCULAR LENS PLACEMENT (IOC);  Surgeon: Harrie Agent, MD;  Location: [facility];  Service:  Ophthalmology;  Laterality: Right;  CDE 1.97   CATARACT EXTRACTION W/PHACO Left 05/09/2023   Procedure: CATARACT EXTRACTION PHACO AND INTRAOCULAR LENS PLACEMENT (IOC);  Surgeon: Harrie Agent, MD;  Location: [facility];  Service: Ophthalmology;  Laterality: Left;  CDE 3.26   CHOLECYSTECTOMY, LAPAROSCOPIC  1994   ESOPHAGOGASTRODUODENOSCOPY  09/03/2016   EXOSTECTECTOMY TOE Right 09/04/2022   Procedure: EXOSTECTECTOMY TOE;  Surgeon: Gershon Donnice SAUNDERS, DPM;  Location: [facility];  Service: Podiatry;  Laterality: Right;   FEMUR IM NAIL Right 05/06/2022   Procedure: INTRAMEDULLARY (IM) NAIL FEMORAL;  Surgeon: Kendal Franky SQUIBB, MD;  Location: [facility];  Service: Orthopedics;  Laterality:  Right;   MOHS SURGERY  2023   spring   PARTIAL GLOSSECTOMY  01/18/2021   @ [facility];  less than half , right lateral    OB History     Gravida  0   Para  0   Term  0   Preterm  0   AB  0   Living  0      SAB  0   IAB  0   Ectopic  0   Multiple  0   Live Births  0           Social History   Socioeconomic History   Marital status: Married    Spouse name: Alm   Number of children: 0   Years of education: 12th   Highest education level: Not on file  Occupational History    Employer: [employer]  Tobacco Use   Smoking status: Never    Passive exposure: Never   Smokeless tobacco: Never  Vaping Use   Vaping status: Never Used  Substance and Sexual Activity   Alcohol use: No   Drug use: Never   Sexual activity: Not Currently    Birth control/protection: None, Post-menopausal  Other Topics Concern   Not on file  Social History Narrative   Patient lives at home with her spouse.   Caffeine Use: 16oz bottle of soda daily   Social Drivers of Health   Financial Resource Strain: Low Risk  (03/19/2024)   Overall Financial Resource Strain (CARDIA)    Difficulty of Paying Living Expenses: Not very hard  Food Insecurity: No Food Insecurity (03/19/2024)   Hunger Vital Sign    Worried About Running Out of Food in the Last Year: Never true    Ran Out of Food in the Last Year: Never true  Transportation Needs: No Transportation Needs (03/19/2024)   PRAPARE - Administrator, Civil Service (Medical): No    Lack of Transportation (Non-Medical): No  Physical Activity: Sufficiently Active (03/19/2024)   Exercise Vital Sign    Days of Exercise per Week: 6 days    Minutes of Exercise per Session: 100 min  Stress: No Stress Concern Present (03/19/2024)   [employer] of Occupational Health - Occupational Stress Questionnaire    Feeling of Stress: Not at all  Social Connections: Socially Integrated (03/19/2024)   Social  Connection and Isolation Panel    Frequency of Communication with Friends and Family: Twice a week    Frequency of Social Gatherings with Friends and Family: More than three times a week    Attends Religious Services: More than 4 times per year    Active Member of [employer] or Organizations: Yes    Attends Engineer, Structural: More than 4 times per year    Marital Status: Married    Family History  Problem Relation Age of Onset   Hypertension Father    Hyperlipidemia Father    Thyroid  disease Mother    Osteoporosis Mother    Stomach cancer Maternal Aunt        great aunt   Throat cancer Maternal Aunt    Diabetes Maternal Uncle    Throat cancer Maternal Uncle    Stomach cancer Maternal Uncle    Arthritis Maternal Great-grandmother      Current Outpatient Medications:    Ascorbic Acid (VITAMIN C) 1000 MG tablet, Take 1,000 mg by mouth daily., Disp: , Rfl:    Cholecalciferol (VITAMIN D ) 50 MCG (2000 UT) CAPS, Take 2,000 Units by mouth daily., Disp: , Rfl:    Coenzyme Q10 (COQ-10) 100 MG CAPS, Take 1 capsule by mouth daily., Disp: , Rfl:    famotidine  (PEPCID ) 20 MG tablet, Take 1 tablet (20 mg total) by mouth 2 (two) times daily., Disp: 60 tablet, Rfl: 2   fenofibrate  54 MG tablet, Take 54 mg by mouth daily., Disp: , Rfl:    folic acid  (FOLVITE ) 1 MG tablet, Take 1 tablet (1 mg total) by mouth daily., Disp: 30 tablet, Rfl: 5   Garlic 1000 MG CAPS, Take 1 capsule by mouth daily., Disp: , Rfl:    glucosamine-chondroitin 500-400 MG tablet, Take 1 tablet by mouth 2 (two) times daily., Disp: , Rfl:    lamoTRIgine  (LAMICTAL  XR) 50 MG 24 hour tablet, Take 3 tablets (150 mg total) by mouth 2 (two) times daily., Disp: 540 tablet, Rfl: 3   levothyroxine  (SYNTHROID ) 125 MCG tablet, take 0.5 tablets (62.5 MICROGRAM total) by mouth daily., Disp: 45 tablet, Rfl: 0   Lycopene 10 MG CAPS, Take 1 capsule by mouth daily., Disp: , Rfl:    MAGNESIUM PO, Take 1 tablet by mouth daily., Disp: , Rfl:     Menaquinone-7 (VITAMIN K2) 100 MCG CAPS, Take 1 capsule by mouth daily., Disp: , Rfl:    Metoprolol  Tartrate 75 MG TABS, Take 1 tablet (75 mg total) by mouth 2 (two) times daily., Disp: 60 tablet, Rfl: 11   Multiple Vitamins-Minerals (CENTRUM PO), Take 1 tablet by mouth daily.  , Disp: , Rfl:    Omega-3 Fatty Acids (OMEGA-3 FISH OIL PO), Take 1 capsule by mouth daily., Disp: , Rfl:    pantoprazole  (PROTONIX ) 40 MG tablet, Take 1 tablet (40 mg total) by mouth daily., Disp: 90 tablet, Rfl: 1   Probiotic Product (PROBIOTIC DAILY) CAPS, Take 1 capsule by mouth daily., Disp: , Rfl:    Quercetin 500 MG CAPS, Take 1 capsule by mouth daily., Disp: , Rfl:    rosuvastatin  (CRESTOR ) 40 MG tablet, take 1 tablet (40 MILLIGRAM total) by mouth at bedtime., Disp: 90 tablet, Rfl: 0   sodium bicarbonate  325 MG tablet, Take 325 mg by mouth 2 (two) times daily., Disp: , Rfl:    VITAMIN E PO, Take 1 capsule by mouth daily., Disp: , Rfl:    zoledronic  acid (RECLAST ) 5 MG/100ML SOLN injection, Inject 5 mg into the vein once., Disp: , Rfl:    clobetasol ointment (TEMOVATE) 0.05 %, Apply topically. (Patient not taking: Reported on 03/19/2024), Disp: , Rfl:    estradiol  (ESTRACE ) 1 MG tablet, Take 1 tablet (1 mg total) by mouth daily., Disp: 30 tablet, Rfl: 11   progesterone  (PROMETRIUM ) 200 MG capsule, Take daily days 1-15 th of each month, Disp: 15 capsule, Rfl: 11  Review of Systems  Review of Systems  Constitutional: Negative for fever, chills, weight  loss, malaise/fatigue and diaphoresis.  HENT: Negative for hearing loss, ear pain, nosebleeds, congestion, sore throat, neck pain, tinnitus and ear discharge.   Eyes: Negative for blurred vision, double vision, photophobia, pain, discharge and redness.  Respiratory: Negative for cough, hemoptysis, sputum production, shortness of breath, wheezing and stridor.   Cardiovascular: Negative for chest pain, palpitations, orthopnea, claudication, leg swelling and PND.   Gastrointestinal: negative for abdominal pain. Negative for heartburn, nausea, vomiting, diarrhea, constipation, blood in stool and melena.  Genitourinary: Negative for dysuria, urgency, frequency, hematuria and flank pain.  Musculoskeletal: Negative for myalgias, back pain, joint pain and falls.  Skin: Negative for itching and rash.  Neurological: Negative for dizziness, tingling, tremors, sensory change, speech change, focal weakness, seizures, loss of consciousness, weakness and headaches.  Endo/Heme/Allergies: Negative for environmental allergies and polydipsia. Does not bruise/bleed easily.  Psychiatric/Behavioral: Negative for depression, suicidal ideas, hallucinations, memory loss and substance abuse. The patient is not nervous/anxious and does not have insomnia.        Objective:  Blood pressure 134/76, pulse 94, height 4' 11 (1.499 m), weight 134 lb (60.8 kg).   Physical Exam  Vitals reviewed. Constitutional: She is oriented to person, place, and time. She appears well-developed and well-nourished.  HENT:  Head: Normocephalic and atraumatic.        Right Ear: External ear normal.  Left Ear: External ear normal.  Nose: Nose normal.  Mouth/Throat: Oropharynx is clear and moist.  Eyes: Conjunctivae and EOM are normal. Pupils are equal, round, and reactive to light. Right eye exhibits no discharge. Left eye exhibits no discharge. No scleral icterus.  Neck: Normal range of motion. Neck supple. No tracheal deviation present. No thyromegaly present.  Cardiovascular: Normal rate, regular rhythm, normal heart sounds and intact distal pulses.  Exam reveals no gallop and no friction rub.   No murmur heard. Respiratory: Effort normal and breath sounds normal. No respiratory distress. She has no wheezes. She has no rales. She exhibits no tenderness.  GI: Soft. Bowel sounds are normal. She exhibits no distension and no mass. There is no tenderness. There is no rebound and no guarding.   Genitourinary:  Breasts no masses skin changes or nipple changes bilaterally      Vulva is normal without lesions Vagina is pink moist without discharge Cervix normal in appearance and pap is not done Uterus is normal size shape and contour Adnexa is negative with normal sized ovaries   Musculoskeletal: Normal range of motion. She exhibits no edema and no tenderness.  Neurological: She is alert and oriented to person, place, and time. She has normal reflexes. She displays normal reflexes. No cranial nerve deficit. She exhibits normal muscle tone. Coordination normal.  Skin: Skin is warm and dry. No rash noted. No erythema. No pallor.  Psychiatric: She has a normal mood and affect. Her behavior is normal. Judgment and thought content normal.       Medications Ordered at today's visit: Meds ordered this encounter  Medications   estradiol  (ESTRACE ) 1 MG tablet    Sig: Take 1 tablet (1 mg total) by mouth daily.    Dispense:  30 tablet    Refill:  11   progesterone  (PROMETRIUM ) 200 MG capsule    Sig: Take daily days 1-15 th of each month    Dispense:  15 capsule    Refill:  11    NA    Other orders placed at today's visit: No orders of the defined types were placed in this encounter.  ASSESSMENT + PLAN:    ICD-10-CM   1. Well woman exam with routine gynecological exam  Z01.419     2. Current long-term use of postmenopausal hormone replacement therapy: E2 1mg  daily + micronized progesterone  200 mg dfays 1-15 of each month  Z79.890           Return for yearly on HRT.

## 2024-03-19 NOTE — Patient Instructions (Addendum)
 Medication Instructions:  Your physician recommends that you continue on your current medications as directed. Please refer to the Current Medication list given to you today.  *If you need a refill on your cardiac medications before your next appointment, please call your pharmacy*  Lab Work: Today:  LPA, LipoB  If you have any lab test that is abnormal or we need to change your treatment, we will call you to review the results.  Testing/Procedures: None ordered  Follow-Up: At [facility], you and your health needs are our priority.  As part of our continuing mission to provide you with exceptional heart care, our providers are all part of one team.  This team includes your primary Cardiologist (physician) and Advanced Practice Providers or APPs (Physician Assistants and Nurse Practitioners) who all work together to provide you with the care you need, when you need it.  Your next appointment:   1 year(s)  Provider:   Dorn Ross, MD     Thank you for choosing [facility]!!   [phone]

## 2024-03-22 ENCOUNTER — Telehealth: Payer: Self-pay

## 2024-03-22 NOTE — Telephone Encounter (Signed)
 Auth Submission: APPROVED Site of care: Site of care: [facility] Payer: [facility] dual complete Medication & CPT/J Code(s) submitted: Reclast  (Zolendronic acid) J3489 Diagnosis Code:  Route of submission (phone, fax, portal): portal Phone # Fax # Auth type: Buy/Bill PB Units/visits requested: 5mg  x 1 dose Reference number: [id number] Approval from: 03/22/24 to 03/22/25

## 2024-03-23 LAB — LIPOPROTEIN A (LPA): Lipoprotein (a): 255.5 nmol/L — ABNORMAL HIGH

## 2024-03-23 LAB — APOLIPOPROTEIN B: Apolipoprotein B: 84 mg/dL

## 2024-03-24 ENCOUNTER — Ambulatory Visit: Payer: Self-pay | Admitting: Cardiology

## 2024-03-24 DIAGNOSIS — E782 Mixed hyperlipidemia: Secondary | ICD-10-CM

## 2024-03-25 ENCOUNTER — Encounter: Attending: "Endocrinology | Admitting: Emergency Medicine

## 2024-03-25 VITALS — BP 113/64 | HR 80 | Temp 97.4°F | Resp 15

## 2024-03-25 DIAGNOSIS — M818 Other osteoporosis without current pathological fracture: Secondary | ICD-10-CM | POA: Diagnosis not present

## 2024-03-25 MED ORDER — ZOLEDRONIC ACID 5 MG/100ML IV SOLN
5.0000 mg | Freq: Once | INTRAVENOUS | Status: AC
  Administered 2024-03-25: 5 mg via INTRAVENOUS

## 2024-03-25 NOTE — Progress Notes (Signed)
 Diagnosis: Osteoporosis  Provider:  Lenis Ethelle Kell MD  Procedure: IV Infusion  IV Type: Peripheral, IV Location: R Antecubital  Reclast  (Zolendronic Acid), Dose: 5 mg  Infusion Start Time: 1049  Infusion Stop Time: 1119  Post Infusion IV Care: Peripheral IV Discontinued  Discharge: Condition: Good, Destination: Home . AVS Declined  Performed by:  Delon ONEIDA Officer, RN

## 2024-03-26 ENCOUNTER — Encounter: Payer: Self-pay | Admitting: Obstetrics & Gynecology

## 2024-03-26 ENCOUNTER — Other Ambulatory Visit (HOSPITAL_COMMUNITY): Payer: Self-pay

## 2024-03-26 ENCOUNTER — Telehealth: Payer: Self-pay | Admitting: Pharmacy Technician

## 2024-03-26 ENCOUNTER — Ambulatory Visit: Attending: Internal Medicine

## 2024-03-26 ENCOUNTER — Telehealth: Payer: Self-pay

## 2024-03-26 DIAGNOSIS — E782 Mixed hyperlipidemia: Secondary | ICD-10-CM

## 2024-03-26 MED ORDER — REPATHA SURECLICK 140 MG/ML ~~LOC~~ SOAJ: 140.0000 mg | SUBCUTANEOUS | 4 refills | Status: AC

## 2024-03-26 NOTE — Progress Notes (Signed)
 Patient ID: [name]                 DOB: [birth year]                    MRN: [id number]      HPI: Latoya Bautista is a 53 y.o. female patient referred to lipid clinic by Latoya Hoover, NP. PMH is significant for HLD, HTN, intersitial lung disease, hx of Hep C, cirrhosis of the liver and melanoma.   Patient presents today in good spirits. She currently takes fenofibrate  54 mg daily, omega-3 fatty acids once daily, and rosuvastatin  40 mg daily for HLD management.  She recently completed a lipid panel (02/2024) which showed total cholesterol of 196 mg/dL, triglycerides at 757 mg/dL, HDL at 57 mg/dL, and LDL at 98 mg/dL. Patient confirmed that she was fasting for these labs these labs and were compliant with her current medications at the time. Additionally, a lipoprotein(a) level obtained in November 2025 was 255 nmol/L, which is also elevated.  The patient currently has Medicare/Medicaid coverage. In the past, PCSK9 inhibitors were not approved; however, she is now interested in confirming whether these medications are covered under her current insurance. Prior to the visit, I verified that Repatha  is covered with a zero-dollar copay.  During the visit, she contacted her local independent pharmacy to confirm medication availability, and it was verified that prescriptions can be sent to [facility]  We reviewed options for lowering LDL cholesterol, including PCSK9 inhibitors. Discussion included mechanisms of action, dosing, potential side effects, and expected LDL cholesterol reduction. Cost considerations and potential patient assistance programs were also addressed.  Current Medications: fenofibrate  54 mg daily, omega-3 fatty acids once daily, rosuvastatin  40 mg daily  Intolerances: simvastatin (rash) Risk Factors: elevated Lpa (255), CAD, elevated coronary calcium  score LDL goal: <70; Trig goal: <849 Lipid panel (02/2024): Chol 196, Trig 242, HDL 57, LDL 98 Lpa (03/2024):  255.5 ApoB (03/2024): 84  Diet:  Breakfast: Vegetable omelet with water  containing electrolytes, water  with electrolytes Lunch/Dinner: Oatmeal Snacks: Nuts  Beverages: Approximately eight bottles of water  daily, with electrolytes added to six of them  Exercise:  Attends [employer] and uses a seated elliptical for approximately 1.5 hours a couple of times per week.  Family History:  Relation Problem Comments  Mother Metallurgist) Osteoporosis   Thyroid  disease     Father Metallurgist) Hyperlipidemia   Hypertension     Brother (Alive)   Maternal Aunt - great aunt (Deceased) Stomach cancer great aunt  Throat cancer     Maternal Uncle (Alive) Diabetes     Maternal Uncle - great uncle (Deceased) Stomach cancer   Throat cancer     Maternal Grandmother (Deceased)   Maternal Grandfather (Deceased)   Paternal Grandmother (Deceased)   Paternal Grandfather (Deceased)   Maternal Great-grandmother (Deceased) Arthritis     Social History:  Alcohol: none Smoking: none   Labs:  Lipid Panel     Component Value Date/Time   CHOL 196 02/19/2024 0818   TRIG 242 (H) 02/19/2024 0818   HDL 57 02/19/2024 0818   CHOLHDL 3.4 02/19/2024 0818   CHOLHDL 3.1 01/23/2022 0804   VLDL 29 01/23/2022 0804   LDLCALC 98 02/19/2024 0818   LABVLDL 41 (H) 02/19/2024 0818    Past Medical History:  Diagnosis Date   Anemia    Cirrhosis of liver without ascites (HCC)    gastro/ liver @ [facility]-- lindsey yaxheimer NP;  secondary to HCV,  well  compensated   Coronary artery calcification seen on CAT scan    followed by cardiology;   CCT w/ FFR 01-22-2022  calcium  score=88.4, LAD / RCA   DOE (dyspnea on exertion)    GERD (gastroesophageal reflux disease)    H/O stem cell transplant (HCC)    1982 and 1984   Heart murmur    History of acute lymphoblastic leukemia (ALL) in remission 90   dx age 45;   chemo;  whole body radiation;  x2 stem cell transplants @ [facility] 1982 & 1984   History of antineoplastic  chemotherapy    child   History of basal cell carcinoma (BCC) excision 10/2016   left leg   History of hepatitis C 2012   pt contracted hep c from blood transfuions's as child in setting of ALL;   08/ 2012 liver bx advanced liver disease mild portal hyertensive gastropathy G2S3, completed 44 wks telaprevir trple bases therapy 12/ 2012 ;  cured and  had  egd 2016 no evidence [facility]   History of kidney stones 2011   History of melanoma in situ 07/2018   vulva bx   History of radiation therapy    whole body radiation in 1980s for ALL   Hyperlipidemia, mixed    Hypersensitivity pneumonitis (HCC)    Hypertension    Hypothyroidism    endocrinologist-- dr charlena lesches   ILD (interstitial lung disease) [facility])    pulmonologist-- dr myrtis gay   Intermittent palpitations    cardiologist--- dr j. 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     Cholecalciferol (VITAMIN D ) 50 MCG (2000 UT) CAPS Take 2,000 Units by mouth daily. (Patient not taking: Reported on 03/19/2024)     clobetasol ointment (TEMOVATE) 0.05 % Apply topically. (Patient not taking: Reported on 03/19/2024)     Coenzyme Q10 (COQ-10) 100 MG CAPS Take 1 capsule by mouth daily.     estradiol  (ESTRACE ) 1 MG tablet Take 1 tablet (1 mg total) by mouth daily. 30 tablet 11   famotidine  (PEPCID ) 20 MG tablet  Take 1 tablet (20 mg total) by mouth 2 (two) times daily. 60 tablet 2   fenofibrate  54 MG tablet Take 54 mg by mouth daily.     folic acid  (FOLVITE ) 1 MG tablet Take 1 tablet (1 mg total) by mouth daily. 30 tablet 5   Garlic 1000 MG CAPS Take 1 capsule by mouth daily.     glucosamine-chondroitin 500-400 MG tablet Take 1 tablet by mouth 2 (two) times daily.     lamoTRIgine  (LAMICTAL  XR) 50 MG 24 hour tablet Take 3 tablets (150 mg total) by mouth 2 (two) times daily. 540 tablet 3   levothyroxine  (SYNTHROID ) 125 MCG tablet take 0.5 tablets (62.5 MICROGRAM total) by mouth daily. 45  tablet 0   Lycopene 10 MG CAPS Take 1 capsule by mouth daily.     MAGNESIUM PO Take 1 tablet by mouth daily.     Menaquinone-7 (VITAMIN K2) 100 MCG CAPS Take 1 capsule by mouth daily.     Metoprolol  Tartrate 75 MG TABS Take 1 tablet (75 mg total) by mouth 2 (two) times daily. 60 tablet 11   Multiple Vitamins-Minerals (CENTRUM PO) Take 1 tablet by mouth daily.       Omega-3 Fatty Acids (OMEGA-3 FISH OIL PO) Take 1 capsule by mouth daily.     pantoprazole  (PROTONIX ) 40 MG tablet Take 1 tablet (40 mg total) by mouth daily. 90 tablet 1   Probiotic Product (PROBIOTIC DAILY) CAPS Take 1 capsule by mouth daily.     progesterone  (PROMETRIUM ) 200 MG capsule Take daily days 1-15 th of each month 15 capsule 11   Quercetin 500 MG CAPS Take 1 capsule by mouth daily.     rosuvastatin  (CRESTOR ) 40 MG tablet take 1 tablet (40 MILLIGRAM total) by mouth at bedtime. 90 tablet 0   sodium bicarbonate  325 MG tablet Take 325 mg by mouth 2 (two) times daily.     VITAMIN E PO Take 1 capsule by mouth daily.     zoledronic  acid (RECLAST ) 5 MG/100ML SOLN injection Inject 5 mg into the vein once.     No current facility-administered medications on file prior to visit.    Allergies  Allergen Reactions   Simvastatin Rash   Briviact [Brivaracetam]     Elevated LFT's   Asparaginase Derivatives Rash   Elspar [Asparaginase] Rash   Keppra   [Levetiracetam ] Rash   Soap Rash and Other (See Comments)    Procedure prep-soap (not Betadine , either- per the patient)   Tape Rash and Other (See Comments)    Redness from the tape    Assessment/Plan:  1. Hyperlipidemia -  Problem  Mixed Hyperlipidemia   Mixed hyperlipidemia Assessment:  LDL goal: < 70  mg/dl; last LDLc 98 mg/dl (88/7974) Trig goal: < 150 mg/dl; last Trig 757 mg/dl (89/7974) Tolerates current HLD regimen well without any side effects (fenofibrate  54 mg daily, omega-3 fatty acids once daily, rosuvastatin  40 mg daily  Intolerance to simvastatin (rash) Discussed next potential options (PCSK-9 inhibitors); cost, dosing efficacy, side effects  Encouraged patient to incorporate heart healthy diet and to aid in LDL and trig lowering  Plan: Begin Repatha  140 mg every 14 days Continue taking current medications (fenofibrate  54 mg daily, omega-3 fatty acids once daily, rosuvastatin  40 mg daily) Lipid lab and Lpa due in 3 months after starting PCSK9i     Thank you,  Shawnda Mauney E. Tyjah Hai, Pharm.D [facility]. [facility] [address], [place], [place] [address] Phone: [phone]; Fax: [phone]

## 2024-03-26 NOTE — Assessment & Plan Note (Addendum)
 Assessment:  LDL goal: < 70  mg/dl; last LDLc 98 mg/dl (88/7974) Trig goal: < 150 mg/dl; last Trig 757 mg/dl (89/7974) Tolerates current HLD regimen well without any side effects (fenofibrate  54 mg daily, omega-3 fatty acids once daily, rosuvastatin  40 mg daily  Intolerance to simvastatin (rash) Discussed next potential options (PCSK-9 inhibitors); cost, dosing efficacy, side effects  Encouraged patient to incorporate heart healthy diet and to aid in LDL and trig lowering  Plan: Begin Repatha  140 mg every 14 days Continue taking current medications (fenofibrate  54 mg daily, omega-3 fatty acids once daily, rosuvastatin  40 mg daily) Lipid lab and Lpa due in 3 months after starting PCSK9i

## 2024-03-26 NOTE — Telephone Encounter (Signed)
   Pharmacy Patient Advocate Encounter   Received notification from Pt Calls Messages that prior authorization for repatha  is required/requested.   Insurance verification completed.   The patient is insured through [employer].   Per test claim: PA required; PA submitted to above mentioned insurance via Latent Key/confirmation #/EOC [id number] Status is pending

## 2024-03-26 NOTE — Patient Instructions (Addendum)
 Your Results:             Your most recent labs Goal  Total Cholesterol 196 < 200  Triglycerides 242 < 150  HDL (happy/good cholesterol) 57 > 40  LDL (lousy/bad cholesterol 98 < 70  Lpa 255.5 <75   Medication changes: Start Repatha  140 mg every 14 days  Continue fenofibrate  54 mg daily, omega - 3 fatty acids once daily and rosuvastatin  40 mg daily We want to repeat labs 3 months after Repatha .  We will send you a lab order to remind you once we get closer to that time.      Repatha  is a cholesterol medication that improved your body's ability to get rid of bad cholesterol known as LDL. It can lower your LDL up to 60%! It is an injection that is given under the skin every 2 weeks. The medication often requires a prior authorization from your insurance company. We will take care of submitting all the necessary information to your insurance company to get it approved. The most common side effects of Repatha  include runny nose, symptoms of the common cold, rarely flu or flu-like symptoms, back/muscle pain in about 3-4% of the patients, and redness, pain, or bruising at the injection site.

## 2024-03-26 NOTE — Telephone Encounter (Signed)
 Please see other encounter.

## 2024-03-26 NOTE — Telephone Encounter (Signed)
 Pharmacy Patient Advocate Encounter  Received notification from [facility] that Prior Authorization for repatha  has been APPROVED from 03/26/24 to 09/23/24. Ran test claim, Copay is $0.00. This test claim was processed through [facility] copay amounts may vary at other pharmacies due to pharmacy/plan contracts, or as the patient moves through the different stages of their insurance plan.   PA #/Case ID/Reference #: [id number]

## 2024-03-29 NOTE — Telephone Encounter (Signed)
 Routing message to Delon Hoover NP.

## 2024-04-16 LAB — LAB REPORT - SCANNED
A1c: 5
Albumin, Urine POC: <3
Albumin/Creatinine Ratio, Urine, POC: <24
Creatinine, POC: 12.5 mg/dL
EGFR: 59
Free T4: 1.21 ng/dL
TSH: 2.04 (ref 0.41–5.90)

## 2024-04-22 ENCOUNTER — Ambulatory Visit: Payer: Self-pay | Admitting: Cardiology

## 2024-04-22 ENCOUNTER — Encounter: Payer: Self-pay | Admitting: Internal Medicine

## 2024-05-20 ENCOUNTER — Other Ambulatory Visit: Payer: Self-pay | Admitting: Medical

## 2024-05-20 ENCOUNTER — Other Ambulatory Visit: Payer: Self-pay | Admitting: "Endocrinology

## 2024-05-20 NOTE — Telephone Encounter (Signed)
 Lipid  panel done 02/19/24

## 2024-08-24 ENCOUNTER — Ambulatory Visit: Admitting: Dermatology

## 2024-08-26 ENCOUNTER — Ambulatory Visit: Admitting: "Endocrinology

## 2024-09-24 ENCOUNTER — Ambulatory Visit (HOSPITAL_COMMUNITY)

## 2024-10-29 ENCOUNTER — Other Ambulatory Visit

## 2024-11-17 ENCOUNTER — Ambulatory Visit: Admitting: Physician Assistant

## 2025-03-28 ENCOUNTER — Ambulatory Visit
